# Patient Record
Sex: Female | Born: 1985 | Race: Black or African American | Hispanic: No | Marital: Single | Smoking: Former smoker
Health system: Southern US, Community
[De-identification: ages and names within clinical notes are randomized; demographics above are authoritative.]

## PROBLEM LIST (undated history)

## (undated) ENCOUNTER — Inpatient Hospital Stay (HOSPITAL_COMMUNITY): Payer: Medicaid Other

## (undated) ENCOUNTER — Ambulatory Visit: Discharge status: Absent without leave | Payer: Medicaid Other

## (undated) ENCOUNTER — Ambulatory Visit: Source: Home / Self Care

## (undated) DIAGNOSIS — IMO0002 Reserved for concepts with insufficient information to code with codable children: Secondary | ICD-10-CM

## (undated) DIAGNOSIS — F319 Bipolar disorder, unspecified: Secondary | ICD-10-CM

## (undated) DIAGNOSIS — Z8742 Personal history of other diseases of the female genital tract: Secondary | ICD-10-CM

## (undated) DIAGNOSIS — F410 Panic disorder [episodic paroxysmal anxiety] without agoraphobia: Secondary | ICD-10-CM

## (undated) DIAGNOSIS — E669 Obesity, unspecified: Secondary | ICD-10-CM

## (undated) DIAGNOSIS — R87619 Unspecified abnormal cytological findings in specimens from cervix uteri: Secondary | ICD-10-CM

## (undated) DIAGNOSIS — F419 Anxiety disorder, unspecified: Secondary | ICD-10-CM

## (undated) DIAGNOSIS — A749 Chlamydial infection, unspecified: Secondary | ICD-10-CM

## (undated) DIAGNOSIS — J189 Pneumonia, unspecified organism: Secondary | ICD-10-CM

## (undated) DIAGNOSIS — D219 Benign neoplasm of connective and other soft tissue, unspecified: Secondary | ICD-10-CM

## (undated) DIAGNOSIS — F4310 Post-traumatic stress disorder, unspecified: Secondary | ICD-10-CM

## (undated) DIAGNOSIS — A549 Gonococcal infection, unspecified: Secondary | ICD-10-CM

## (undated) DIAGNOSIS — J40 Bronchitis, not specified as acute or chronic: Secondary | ICD-10-CM

## (undated) DIAGNOSIS — U071 COVID-19: Secondary | ICD-10-CM

## (undated) DIAGNOSIS — K219 Gastro-esophageal reflux disease without esophagitis: Secondary | ICD-10-CM

## (undated) DIAGNOSIS — Z973 Presence of spectacles and contact lenses: Secondary | ICD-10-CM

## (undated) DIAGNOSIS — F32A Depression, unspecified: Secondary | ICD-10-CM

## (undated) DIAGNOSIS — R7303 Prediabetes: Secondary | ICD-10-CM

## (undated) DIAGNOSIS — G4730 Sleep apnea, unspecified: Secondary | ICD-10-CM

## (undated) DIAGNOSIS — I10 Essential (primary) hypertension: Secondary | ICD-10-CM

## (undated) DIAGNOSIS — D649 Anemia, unspecified: Secondary | ICD-10-CM

## (undated) DIAGNOSIS — N390 Urinary tract infection, site not specified: Secondary | ICD-10-CM

## (undated) DIAGNOSIS — N920 Excessive and frequent menstruation with regular cycle: Secondary | ICD-10-CM

## (undated) DIAGNOSIS — A599 Trichomoniasis, unspecified: Secondary | ICD-10-CM

## (undated) DIAGNOSIS — O139 Gestational [pregnancy-induced] hypertension without significant proteinuria, unspecified trimester: Secondary | ICD-10-CM

## (undated) DIAGNOSIS — F329 Major depressive disorder, single episode, unspecified: Secondary | ICD-10-CM

## (undated) HISTORY — PX: TUBAL LIGATION: SHX77

## (undated) HISTORY — PX: WISDOM TOOTH EXTRACTION: SHX21

## (undated) HISTORY — PX: ABDOMINAL SURGERY: SHX537

## (undated) HISTORY — PX: HERNIA REPAIR: SHX51

## (undated) HISTORY — PX: FOOT SURGERY: SHX648

---
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
## 2003-03-15 ENCOUNTER — Encounter: Payer: Self-pay | Admitting: Obstetrics & Gynecology

## 2003-03-15 ENCOUNTER — Ambulatory Visit (HOSPITAL_COMMUNITY): Admission: RE | Admit: 2003-03-15 | Discharge: 2003-03-15 | Payer: Self-pay | Admitting: Obstetrics & Gynecology

## 2003-05-01 ENCOUNTER — Inpatient Hospital Stay (HOSPITAL_COMMUNITY): Admission: AD | Admit: 2003-05-01 | Discharge: 2003-05-05 | Payer: Self-pay | Admitting: Obstetrics

## 2003-05-02 ENCOUNTER — Encounter (INDEPENDENT_AMBULATORY_CARE_PROVIDER_SITE_OTHER): Payer: Self-pay | Admitting: *Deleted

## 2003-10-12 ENCOUNTER — Emergency Department (HOSPITAL_COMMUNITY): Admission: EM | Admit: 2003-10-12 | Discharge: 2003-10-12 | Payer: Self-pay | Admitting: Family Medicine

## 2004-01-24 ENCOUNTER — Emergency Department (HOSPITAL_COMMUNITY): Admission: EM | Admit: 2004-01-24 | Discharge: 2004-01-24 | Payer: Self-pay | Admitting: Family Medicine

## 2004-07-27 ENCOUNTER — Emergency Department (HOSPITAL_COMMUNITY): Admission: EM | Admit: 2004-07-27 | Discharge: 2004-07-27 | Payer: Self-pay | Admitting: Family Medicine

## 2006-01-24 ENCOUNTER — Emergency Department (HOSPITAL_COMMUNITY): Admission: EM | Admit: 2006-01-24 | Discharge: 2006-01-24 | Payer: Self-pay | Admitting: Emergency Medicine

## 2006-03-08 ENCOUNTER — Emergency Department (HOSPITAL_COMMUNITY): Admission: EM | Admit: 2006-03-08 | Discharge: 2006-03-08 | Payer: Self-pay | Admitting: Emergency Medicine

## 2006-11-03 ENCOUNTER — Inpatient Hospital Stay (HOSPITAL_COMMUNITY): Admission: AD | Admit: 2006-11-03 | Discharge: 2006-11-03 | Payer: Self-pay | Admitting: Obstetrics & Gynecology

## 2006-11-09 ENCOUNTER — Inpatient Hospital Stay (HOSPITAL_COMMUNITY): Admission: AD | Admit: 2006-11-09 | Discharge: 2006-11-09 | Payer: Self-pay | Admitting: Obstetrics and Gynecology

## 2006-12-02 ENCOUNTER — Emergency Department (HOSPITAL_COMMUNITY): Admission: EM | Admit: 2006-12-02 | Discharge: 2006-12-02 | Payer: Self-pay | Admitting: Emergency Medicine

## 2007-01-06 ENCOUNTER — Ambulatory Visit (HOSPITAL_COMMUNITY): Admission: RE | Admit: 2007-01-06 | Discharge: 2007-01-06 | Payer: Self-pay | Admitting: Family Medicine

## 2007-01-28 ENCOUNTER — Ambulatory Visit (HOSPITAL_COMMUNITY): Admission: RE | Admit: 2007-01-28 | Discharge: 2007-01-28 | Payer: Self-pay | Admitting: Family Medicine

## 2007-02-10 ENCOUNTER — Ambulatory Visit (HOSPITAL_COMMUNITY): Admission: RE | Admit: 2007-02-10 | Discharge: 2007-02-10 | Payer: Self-pay | Admitting: Family Medicine

## 2007-03-10 ENCOUNTER — Ambulatory Visit (HOSPITAL_COMMUNITY): Admission: RE | Admit: 2007-03-10 | Discharge: 2007-03-10 | Payer: Self-pay | Admitting: Family Medicine

## 2007-04-07 ENCOUNTER — Ambulatory Visit (HOSPITAL_COMMUNITY): Admission: RE | Admit: 2007-04-07 | Discharge: 2007-04-07 | Payer: Self-pay | Admitting: Family Medicine

## 2007-05-03 ENCOUNTER — Ambulatory Visit (HOSPITAL_COMMUNITY): Admission: RE | Admit: 2007-05-03 | Discharge: 2007-05-03 | Payer: Self-pay | Admitting: Family Medicine

## 2007-06-16 ENCOUNTER — Ambulatory Visit (HOSPITAL_COMMUNITY): Admission: RE | Admit: 2007-06-16 | Discharge: 2007-06-16 | Payer: Self-pay | Admitting: Family Medicine

## 2007-06-22 ENCOUNTER — Ambulatory Visit (HOSPITAL_COMMUNITY): Admission: RE | Admit: 2007-06-22 | Discharge: 2007-06-22 | Payer: Self-pay | Admitting: Family Medicine

## 2007-06-30 ENCOUNTER — Ambulatory Visit (HOSPITAL_COMMUNITY): Admission: RE | Admit: 2007-06-30 | Discharge: 2007-06-30 | Payer: Self-pay | Admitting: Family Medicine

## 2007-07-05 ENCOUNTER — Inpatient Hospital Stay (HOSPITAL_COMMUNITY): Admission: RE | Admit: 2007-07-05 | Discharge: 2007-07-07 | Payer: Self-pay | Admitting: Obstetrics & Gynecology

## 2007-07-05 ENCOUNTER — Other Ambulatory Visit: Payer: Self-pay | Admitting: Obstetrics & Gynecology

## 2007-07-05 ENCOUNTER — Ambulatory Visit: Payer: Self-pay | Admitting: Obstetrics & Gynecology

## 2007-09-12 ENCOUNTER — Emergency Department (HOSPITAL_COMMUNITY): Admission: EM | Admit: 2007-09-12 | Discharge: 2007-09-12 | Payer: Self-pay | Admitting: Family Medicine

## 2008-04-04 ENCOUNTER — Emergency Department (HOSPITAL_COMMUNITY): Admission: EM | Admit: 2008-04-04 | Discharge: 2008-04-04 | Payer: Self-pay | Admitting: Family Medicine

## 2008-04-08 ENCOUNTER — Emergency Department (HOSPITAL_COMMUNITY): Admission: EM | Admit: 2008-04-08 | Discharge: 2008-04-08 | Payer: Self-pay | Admitting: Emergency Medicine

## 2008-10-02 ENCOUNTER — Emergency Department (HOSPITAL_COMMUNITY): Admission: EM | Admit: 2008-10-02 | Discharge: 2008-10-02 | Payer: Self-pay | Admitting: Family Medicine

## 2008-11-11 ENCOUNTER — Emergency Department (HOSPITAL_COMMUNITY): Admission: EM | Admit: 2008-11-11 | Discharge: 2008-11-11 | Payer: Self-pay | Admitting: Emergency Medicine

## 2009-03-01 ENCOUNTER — Emergency Department (HOSPITAL_COMMUNITY): Admission: EM | Admit: 2009-03-01 | Discharge: 2009-03-01 | Payer: Self-pay | Admitting: Family Medicine

## 2009-08-13 ENCOUNTER — Emergency Department (HOSPITAL_COMMUNITY): Admission: EM | Admit: 2009-08-13 | Discharge: 2009-08-14 | Payer: Self-pay | Admitting: Emergency Medicine

## 2009-10-14 ENCOUNTER — Emergency Department (HOSPITAL_COMMUNITY): Admission: EM | Admit: 2009-10-14 | Discharge: 2009-10-14 | Payer: Self-pay | Admitting: Emergency Medicine

## 2009-11-28 ENCOUNTER — Emergency Department (HOSPITAL_COMMUNITY): Admission: EM | Admit: 2009-11-28 | Discharge: 2009-11-29 | Payer: Self-pay | Admitting: Emergency Medicine

## 2010-01-02 ENCOUNTER — Inpatient Hospital Stay (HOSPITAL_COMMUNITY): Admission: AD | Admit: 2010-01-02 | Discharge: 2010-01-02 | Payer: Self-pay | Admitting: Obstetrics & Gynecology

## 2010-01-20 ENCOUNTER — Inpatient Hospital Stay (HOSPITAL_COMMUNITY): Admission: AD | Admit: 2010-01-20 | Discharge: 2010-01-20 | Payer: Self-pay | Admitting: Obstetrics & Gynecology

## 2010-01-20 ENCOUNTER — Ambulatory Visit: Payer: Self-pay | Admitting: Family

## 2010-02-20 ENCOUNTER — Ambulatory Visit: Payer: Self-pay | Admitting: Obstetrics and Gynecology

## 2010-02-20 ENCOUNTER — Inpatient Hospital Stay (HOSPITAL_COMMUNITY): Admission: AD | Admit: 2010-02-20 | Discharge: 2010-02-20 | Payer: Self-pay | Admitting: Obstetrics and Gynecology

## 2010-03-06 ENCOUNTER — Ambulatory Visit: Payer: Self-pay | Admitting: Obstetrics and Gynecology

## 2010-03-06 ENCOUNTER — Inpatient Hospital Stay (HOSPITAL_COMMUNITY): Admission: AD | Admit: 2010-03-06 | Discharge: 2010-03-06 | Payer: Self-pay | Admitting: Obstetrics and Gynecology

## 2010-03-06 ENCOUNTER — Encounter: Payer: Self-pay | Admitting: Family Medicine

## 2010-05-13 ENCOUNTER — Inpatient Hospital Stay (HOSPITAL_COMMUNITY)
Admission: AD | Admit: 2010-05-13 | Discharge: 2010-05-13 | Payer: Self-pay | Source: Home / Self Care | Admitting: Family Medicine

## 2010-06-09 HISTORY — PX: TUBAL LIGATION: SHX77

## 2010-07-18 ENCOUNTER — Other Ambulatory Visit: Payer: Self-pay | Admitting: Obstetrics and Gynecology

## 2010-07-18 ENCOUNTER — Ambulatory Visit (HOSPITAL_COMMUNITY): Payer: Medicaid Other | Attending: Obstetrics and Gynecology

## 2010-07-18 ENCOUNTER — Other Ambulatory Visit (HOSPITAL_COMMUNITY): Payer: Self-pay | Admitting: Obstetrics and Gynecology

## 2010-07-18 DIAGNOSIS — Z3689 Encounter for other specified antenatal screening: Secondary | ICD-10-CM

## 2010-07-30 ENCOUNTER — Ambulatory Visit (HOSPITAL_COMMUNITY)
Admission: RE | Admit: 2010-07-30 | Discharge: 2010-07-30 | Disposition: A | Discharge status: Home / Self Care | Payer: Medicaid Other | Source: Ambulatory Visit | Attending: Obstetrics and Gynecology | Admitting: Obstetrics and Gynecology

## 2010-07-30 ENCOUNTER — Encounter (HOSPITAL_COMMUNITY): Payer: Self-pay

## 2010-07-30 DIAGNOSIS — O358XX0 Maternal care for other (suspected) fetal abnormality and damage, not applicable or unspecified: Secondary | ICD-10-CM | POA: Insufficient documentation

## 2010-07-30 DIAGNOSIS — Z1389 Encounter for screening for other disorder: Secondary | ICD-10-CM | POA: Insufficient documentation

## 2010-07-30 DIAGNOSIS — Z3689 Encounter for other specified antenatal screening: Secondary | ICD-10-CM

## 2010-07-30 DIAGNOSIS — E669 Obesity, unspecified: Secondary | ICD-10-CM | POA: Insufficient documentation

## 2010-07-30 DIAGNOSIS — Z363 Encounter for antenatal screening for malformations: Secondary | ICD-10-CM | POA: Insufficient documentation

## 2010-08-01 ENCOUNTER — Other Ambulatory Visit (HOSPITAL_COMMUNITY): Payer: Self-pay | Admitting: Obstetrics and Gynecology

## 2010-08-19 LAB — URINALYSIS, ROUTINE W REFLEX MICROSCOPIC
Bilirubin Urine: NEGATIVE
Glucose, UA: NEGATIVE mg/dL
Ketones, ur: NEGATIVE mg/dL
Leukocytes, UA: NEGATIVE
Nitrite: NEGATIVE
Protein, ur: NEGATIVE mg/dL
Specific Gravity, Urine: >1.03 — ABNORMAL HIGH (ref 1.005–1.030)
Urobilinogen, UA: 2 mg/dL — ABNORMAL HIGH (ref 0.0–1.0)
pH: 6 (ref 5.0–8.0)

## 2010-08-19 LAB — WET PREP, GENITAL
Trich, Wet Prep: NONE SEEN
Yeast Wet Prep HPF POC: NONE SEEN

## 2010-08-19 LAB — URINE CULTURE
Colony Count: 75000
Culture  Setup Time: 201112060123

## 2010-08-19 LAB — URINE MICROSCOPIC-ADD ON

## 2010-08-19 LAB — GC/CHLAMYDIA PROBE AMP, GENITAL
Chlamydia, DNA Probe: NEGATIVE
GC Probe Amp, Genital: NEGATIVE

## 2010-08-22 LAB — URINALYSIS, ROUTINE W REFLEX MICROSCOPIC
Bilirubin Urine: NEGATIVE
Bilirubin Urine: NEGATIVE
Glucose, UA: NEGATIVE mg/dL
Glucose, UA: NEGATIVE mg/dL
Ketones, ur: NEGATIVE mg/dL
Ketones, ur: 15 mg/dL — AB
Leukocytes, UA: NEGATIVE
Leukocytes, UA: NEGATIVE
Nitrite: NEGATIVE
Nitrite: NEGATIVE
Protein, ur: NEGATIVE mg/dL
Protein, ur: NEGATIVE mg/dL
Specific Gravity, Urine: 1.02 (ref 1.005–1.030)
Specific Gravity, Urine: >1.03 — ABNORMAL HIGH (ref 1.005–1.030)
Urobilinogen, UA: 1 mg/dL (ref 0.0–1.0)
Urobilinogen, UA: 1 mg/dL (ref 0.0–1.0)
pH: 6 (ref 5.0–8.0)
pH: 6 (ref 5.0–8.0)

## 2010-08-22 LAB — ABO/RH: ABO/RH(D): O POS

## 2010-08-22 LAB — CBC
HCT: 35.8 % — ABNORMAL LOW (ref 36.0–46.0)
Hemoglobin: 12.2 g/dL (ref 12.0–15.0)
MCH: 29.9 pg (ref 26.0–34.0)
MCHC: 34.2 g/dL (ref 30.0–36.0)
MCV: 87.5 fL (ref 78.0–100.0)
Platelets: 290 10*3/uL (ref 150–400)
RBC: 4.09 MIL/uL (ref 3.87–5.11)
RDW: 14.3 % (ref 11.5–15.5)
WBC: 12.3 10*3/uL — ABNORMAL HIGH (ref 4.0–10.5)

## 2010-08-22 LAB — URINE MICROSCOPIC-ADD ON

## 2010-08-22 LAB — POCT PREGNANCY, URINE
Preg Test, Ur: POSITIVE
Preg Test, Ur: NEGATIVE
Preg Test, Ur: NEGATIVE

## 2010-08-22 LAB — HCG, QUANTITATIVE, PREGNANCY: hCG, Beta Chain, Quant, S: 20141 m[IU]/mL — ABNORMAL HIGH (ref <5)

## 2010-08-23 LAB — URINALYSIS, ROUTINE W REFLEX MICROSCOPIC
Glucose, UA: NEGATIVE mg/dL
Ketones, ur: 15 mg/dL — AB
Leukocytes, UA: NEGATIVE
Nitrite: NEGATIVE
Protein, ur: NEGATIVE mg/dL
Specific Gravity, Urine: 1.02 (ref 1.005–1.030)
Urobilinogen, UA: 1 mg/dL (ref 0.0–1.0)
pH: 6.5 (ref 5.0–8.0)

## 2010-08-23 LAB — POCT PREGNANCY, URINE: Preg Test, Ur: NEGATIVE

## 2010-08-23 LAB — URINE MICROSCOPIC-ADD ON

## 2010-08-24 ENCOUNTER — Inpatient Hospital Stay (HOSPITAL_COMMUNITY)
Admission: AD | Admit: 2010-08-24 | Discharge: 2010-08-24 | Disposition: A | Discharge status: Home / Self Care | Payer: Medicaid Other | Source: Ambulatory Visit | Attending: Obstetrics and Gynecology | Admitting: Obstetrics and Gynecology

## 2010-08-24 DIAGNOSIS — O4700 False labor before 37 completed weeks of gestation, unspecified trimester: Secondary | ICD-10-CM | POA: Insufficient documentation

## 2010-08-24 LAB — URINALYSIS, ROUTINE W REFLEX MICROSCOPIC
Bilirubin Urine: NEGATIVE
Glucose, UA: NEGATIVE mg/dL
Glucose, UA: NEGATIVE mg/dL
Ketones, ur: NEGATIVE mg/dL
Ketones, ur: 15 mg/dL — AB
Leukocytes, UA: NEGATIVE
Leukocytes, UA: NEGATIVE
Nitrite: NEGATIVE
Nitrite: NEGATIVE
Protein, ur: 30 mg/dL — AB
Protein, ur: NEGATIVE mg/dL
Specific Gravity, Urine: 1.02 (ref 1.005–1.030)
Specific Gravity, Urine: >1.03 — ABNORMAL HIGH (ref 1.005–1.030)
Urobilinogen, UA: 1 mg/dL (ref 0.0–1.0)
Urobilinogen, UA: 1 mg/dL (ref 0.0–1.0)
pH: 7 (ref 5.0–8.0)
pH: 6 (ref 5.0–8.0)

## 2010-08-24 LAB — POCT PREGNANCY, URINE: Preg Test, Ur: NEGATIVE

## 2010-08-24 LAB — URINE MICROSCOPIC-ADD ON

## 2010-08-24 LAB — FETAL FIBRONECTIN: Fetal Fibronectin: NEGATIVE

## 2010-08-26 LAB — URINE CULTURE
Colony Count: NO GROWTH
Culture  Setup Time: 201203180321
Culture: NO GROWTH

## 2010-08-27 ENCOUNTER — Ambulatory Visit (HOSPITAL_COMMUNITY)
Admission: RE | Admit: 2010-08-27 | Discharge: 2010-08-27 | Disposition: A | Discharge status: Home / Self Care | Payer: Medicaid Other | Source: Ambulatory Visit | Attending: Obstetrics and Gynecology | Admitting: Obstetrics and Gynecology

## 2010-08-27 ENCOUNTER — Other Ambulatory Visit (HOSPITAL_COMMUNITY): Payer: Self-pay | Admitting: Obstetrics and Gynecology

## 2010-08-27 DIAGNOSIS — Z3689 Encounter for other specified antenatal screening: Secondary | ICD-10-CM | POA: Insufficient documentation

## 2010-09-18 LAB — POCT H PYLORI SCREEN: H. PYLORI SCREEN, POC: NEGATIVE

## 2010-10-01 ENCOUNTER — Ambulatory Visit (HOSPITAL_COMMUNITY)
Admission: RE | Admit: 2010-10-01 | Discharge: 2010-10-01 | Disposition: A | Discharge status: Home / Self Care | Payer: Medicaid Other | Source: Ambulatory Visit | Attending: Obstetrics and Gynecology | Admitting: Obstetrics and Gynecology

## 2010-10-01 DIAGNOSIS — O99210 Obesity complicating pregnancy, unspecified trimester: Secondary | ICD-10-CM | POA: Insufficient documentation

## 2010-10-01 DIAGNOSIS — E669 Obesity, unspecified: Secondary | ICD-10-CM | POA: Insufficient documentation

## 2010-10-01 DIAGNOSIS — O34219 Maternal care for unspecified type scar from previous cesarean delivery: Secondary | ICD-10-CM | POA: Insufficient documentation

## 2010-10-17 ENCOUNTER — Other Ambulatory Visit: Payer: Self-pay | Admitting: Obstetrics and Gynecology

## 2010-10-17 ENCOUNTER — Encounter (HOSPITAL_COMMUNITY): Payer: Medicaid Other

## 2010-10-17 LAB — CBC
HCT: 33.5 % — ABNORMAL LOW (ref 36.0–46.0)
Hemoglobin: 10.8 g/dL — ABNORMAL LOW (ref 12.0–15.0)
MCH: 27 pg (ref 26.0–34.0)
MCHC: 32.2 g/dL (ref 30.0–36.0)
MCV: 83.8 fL (ref 78.0–100.0)
Platelets: 284 10*3/uL (ref 150–400)
RBC: 4 MIL/uL (ref 3.87–5.11)
RDW: 15.2 % (ref 11.5–15.5)
WBC: 10.9 10*3/uL — ABNORMAL HIGH (ref 4.0–10.5)

## 2010-10-17 LAB — SURGICAL PCR SCREEN
MRSA, PCR: NEGATIVE
Staphylococcus aureus: NEGATIVE

## 2010-10-18 LAB — RPR: RPR Ser Ql: NONREACTIVE

## 2010-10-22 NOTE — Op Note (Signed)
[name], [name]              ACCOUNT NO.:  [id number]   MEDICAL RECORD NO.:  [id number]          PATIENT TYPE:  INP   LOCATION:  9101                          FACILITY:  [facility]   PHYSICIAN:  Allie Bossier, MD        DATE OF BIRTH:  Feb 06, 1986   DATE OF PROCEDURE:  07/05/2007  DATE OF DISCHARGE:                               OPERATIVE REPORT   PREOPERATIVE DIAGNOSES:  1. Intrauterine pregnancy at 39 weeks three days gestation.  2. History of previous cesarean section.  3. Suspected fetal macrosomia.  4. Morbid obesity.   POSTOPERATIVE DIAGNOSES:  1. Intrauterine pregnancy at 39 weeks three days gestation.  2. History of previous cesarean section.  3. Suspected fetal macrosomia.  4. Morbid obesity.   PROCEDURE:  Repeat low transverse cesarean section.   SURGEON:  Nicholaus Bloom, M.D.   ASSISTANT:  Karlton Lemon, M.D.   ANESTHESIA:  Spinal.   FINDINGS:  1. Viable infant female weighing 8 pounds 15 ounces with Apgars 9 at one      minute and 9 at five minutes in cephalic presentation.  2. Clear amniotic fluid.  3. Normal female pelvic anatomy.   ESTIMATED BLOOD LOSS:  800 ml.   DRAINS:  Foley with clear yellow urine.   COMPLICATIONS:  None immediate.   SPECIMENS:  Placenta to labor and delivery; cord blood to the lab.   INDICATIONS FOR PROCEDURE:  [name] is a 25 year old gravida 2, para 1  0-0-1 at 85 weeks three days gestation with a history of previous c-  section and suspected fetal macrosomia, who presents with desire for  repeat cesarean section.   DESCRIPTION OF PROCEDURE:  The patient was taken to the operating room  and after obtaining adequate spinal anesthesia was prepped and draped in  the usual sterile manner in the supine position with left lateral  uterine displacement.  The abdomen was taped up prior to prepping to  provide adequate visualization of the operative site. After insuring  adequate anesthesia, a Pfannenstiel skin incision was made using the  scalpel. This incision was carried down through the subcutaneous tissues  using the scalpel. The rectus fascia was nicked in the midline and the  incision was extended laterally using the Mayo scissors. The rectus  muscle was dissected free of the fascia using both sharp and blunt  dissection.  The rectus muscles were separated with Mayo scissors in the  midline. The parietal peritoneum was identified and entered under direct  visualization bluntly.  The bladder blade was then placed. A transverse  uterine incision was made using the scalpel and the incision was  extended laterally and superiorly using bandage scissors and blunt  dissection. A hand was placed in the uterine cavity and used to elevate  and flex the head of the infant, but the infant's head was difficult to  deliver by this manner. The Kiwi vacuum was then applied to the infant's  head being careful not to place it over the fontanel and in one lone  steady pull, the infant's head was delivered with the vacuum traction.  The infant's head was bulb suctioned after delivery and then the  infant's body was delivered atraumatically. The cord was doubly clamped  and cut and the infant handed to the nursery team in attendance. The  specimen was collected for cord blood. The placenta was delivered with  uterine massage and traction and appeared intact. The endometrial cavity  was wiped free and any traces of membrane using wet laparotomy sponges.  The uterine incision was then closed in one layer using two stitches of  0 chromic, the first beginning on the left lateral edge to the midline  followed by a second stitch in the right lateral edge and meeting in the  midline. The uterine incision was inspected and found to be hemostatic.  The fascia was then closed with two stitches of 0 PDS beginning  laterally and meeting in the midline. Small areas of bleeding of the  subcutaneous tissue were controlled using the Bovie cautery.  The  skin  was reapproximated with stainless steel skin staples. Sponge, needle and  instrument counts were correct x 2. The patient tolerated the procedure  well and went to the postanesthesia care unit in stable condition. Her  Foley catheter drained clear urine throughout.      Karlton Lemon, MD  Electronically Signed     ______________________________  Allie Bossier, MD    NS/MEDQ  D:  07/05/2007  T:  07/06/2007  Job:  [id number]

## 2010-10-22 NOTE — Discharge Summary (Signed)
[name], [name]              ACCOUNT NO.:  [id number]   MEDICAL RECORD NO.:  [id number]          PATIENT TYPE:  INP   LOCATION:  9101                          [facility]ACILITY[facility]  [facility]   PHYSICIAN:  Allie Bossier, MD        DATE OF BIRTH:  1985-11-21   DATE OF ADMISSION:  07/05/2007  DATE OF DISCHARGE:                               DISCHARGE SUMMARY   REASON FOR ADMISSION:  Repeat low transverse cesarean section.   DISCHARGE DIAGNOSIS:  Repeat term pregnancy delivered.   PROCEDURES:  Repeat low transverse cesarean section without  complications.  Please see operative note for full details.   LABORATORY DATA:  Postop hemoglobin was 9.7, and hematocrit was 28.3.  This was drawn on 07/07/2007.   HOSPITAL COURSE:  [name] was admitted as a 25 year old, G2, P2-0-0-2.  Was admitted for repeat low transverse cesarean section at term.  Her  pregnancy was uncomplicated.  She chose to go with repeat cesarean  section as opposed to VBAC.  She delivered a healthy female via cesarean  section on 07/05/2007.  Her pain was well controlled with oral  medications.  Lochia was decreasing at the time of discharge.  Her  incision was clean, dry, and intact without any oozing.  Baby was breast  feeding well, and she was supplementing some with bottle until her milk  came in.  She plans to do the Mirena IUD for contraception.  She plans  to do a circumcision at a later date.   INSTRUCTIONS:  She is not to do any heavy lifting for six weeks and  nothing per vagina for six weeks.  Her diet is routine.   MEDICATIONS:  Include:  1. Percocet 5/325 one to two tablets p.o. q. 4 to 6 hours p.r.n. pain.  2. Motrin 600 mg 1 tab p.o. q. 6 hours p.r.n. pain.  3. Colace 100 mg 1 tab p.o. b.i.d. p.r.n. constipation.  4. Prenatal vitamins  1 tab p.o. daily.  5. Iron sulfate 325 mg 1 tab p.o. b.i.d.   STATUS:  Well.   FOLLOW UP:  She is to follow up in six weeks at the [facility] Department.      Ancil Boozer, MD      Allie Bossier, MD  Electronically Signed    SA/MEDQ  D:  07/07/2007  T:  07/07/2007  Job:  [id number]

## 2010-10-23 ENCOUNTER — Inpatient Hospital Stay (HOSPITAL_COMMUNITY)
Admission: RE | Admit: 2010-10-23 | Discharge: 2010-10-26 | DRG: 766 | Disposition: A | Discharge status: Home / Self Care | Payer: Medicaid Other | Source: Ambulatory Visit | Attending: Obstetrics and Gynecology | Admitting: Obstetrics and Gynecology

## 2010-10-23 ENCOUNTER — Other Ambulatory Visit: Payer: Self-pay | Admitting: Obstetrics and Gynecology

## 2010-10-23 DIAGNOSIS — O34219 Maternal care for unspecified type scar from previous cesarean delivery: Principal | ICD-10-CM | POA: Diagnosis present

## 2010-10-23 DIAGNOSIS — Z302 Encounter for sterilization: Secondary | ICD-10-CM

## 2010-10-23 DIAGNOSIS — E669 Obesity, unspecified: Secondary | ICD-10-CM | POA: Diagnosis present

## 2010-10-23 DIAGNOSIS — Z01818 Encounter for other preprocedural examination: Secondary | ICD-10-CM

## 2010-10-23 DIAGNOSIS — O99214 Obesity complicating childbirth: Secondary | ICD-10-CM | POA: Diagnosis present

## 2010-10-23 DIAGNOSIS — Z01812 Encounter for preprocedural laboratory examination: Secondary | ICD-10-CM

## 2010-10-24 LAB — CBC
HCT: 30 % — ABNORMAL LOW (ref 36.0–46.0)
Hemoglobin: 9.5 g/dL — ABNORMAL LOW (ref 12.0–15.0)
MCH: 26.8 pg (ref 26.0–34.0)
MCHC: 31.7 g/dL (ref 30.0–36.0)
MCV: 84.5 fL (ref 78.0–100.0)
Platelets: 241 10*3/uL (ref 150–400)
RBC: 3.55 MIL/uL — ABNORMAL LOW (ref 3.87–5.11)
RDW: 15.6 % — ABNORMAL HIGH (ref 11.5–15.5)
WBC: 11.8 10*3/uL — ABNORMAL HIGH (ref 4.0–10.5)

## 2010-10-24 NOTE — H&P (Signed)
[name], [name]              ACCOUNT NO.:  [id number]  MEDICAL RECORD NO.:  [id number]           PATIENT TYPE:  O  LOCATION:  SDC                           [facility]ACILITY:  [facility]  PHYSICIAN:  Jenny Monday, MD        DATE OF BIRTH:  04-12-1986  DATE OF ADMISSION:  10/17/2010 DATE OF DISCHARGE:  10/17/2010                             HISTORY & PHYSICAL   ADMISSION DIAGNOSIS:  Intrauterine pregnancy at 39 weeks for repeat low transverse cesarean section through vertical incision as well as bilateral tubal ligation.  She has a history of low transverse cesarean section x2 as well as undesired fertility.  HISTORY OF PRESENT ILLNESS:  A 25 year old G3, P 2-0-0-2 at 39 plus weeks for repeat low transverse cesarean section and bilateral tubal ligation through a midline vertical incision.  She states she has had good fetal movement.  No loss of fluid, no vaginal bleeding and occasional contractions.  She voices understanding of the risks, benefits and alternatives to surgery as well as with her body type that it is a safer option to proceed through a supraumbilical midline vertical incision given her large pannus as well as given her body type it is somewhat safer to proceed with tubal ligation at the time of her cesarean section even though her tubal papers have not been signed secondary to the risks of surgery.  She voices understanding of the risks, benefits and alternatives of surgery including bleeding, infection, damage to surrounding organs, trouble healing scar tissue and complications.  She also voices understanding to an increased risk of ectopic pregnancy and approximately wanted to have a risk of pregnancy after a tubal ligation.  She will proceed on Oct 23, 2010, with this surgery.  PAST MEDICAL HISTORY:  Significant for: 1. Anxiety disorder. 2. Depression. 3. GERD. 4. Hypertension. 5. Pneumonia.  PAST SURGICAL HISTORY:  Significant for cesarean section x2, foot surgery.  wisdom tooth extraction.  PAST OB/GYN HISTORY:  She is a G3, P2-0-0-2 with two term low transverse cesarean sections the first of which were failure to progress for a 9 pound female infant and a second as a repeat for a 9 pound female infant. She also has a history of abnormal Pap smear with mild dysplasia two colposcopies dated normal since this time.  She has a history of gonorrhea, Chlamydia and pelvic inflammatory disease as well as Trichomonas.  MEDICATIONS:  Prenatal vitamins.  ALLERGIES:  No known drug allergies.  No latex allergy.  SOCIAL HISTORY:  The patient denies alcohol and drug use.  She admits to having a history of smoking.  She is single, has a history of herpes but is currently safe in her relationship.  FAMILY MEDICAL HISTORY:  Significant for bipolar, cervical cancer, diabetes, hypertension, schizophrenia and thyroid dysfunction.  LABORATORY DATA:  She is O+, antibody screen negative, hemoglobin 11.1. Pap smear within normal limits.  Rubella immune, RPR nonreactive.  Urine culture negative.  She has B surface antigen negative, HIV negative, platelets 339,000.  Varicella IgG is positive.  Hemoglobin electrophoresis within normal limits.  Gonorrhea negative, Chlamydia negative.  AFP within normal limits.  She is dated by a 6-week 1-day scan with an [facility] of Oct 29, 2010.  Anatomy ultrasound secondary to her presenting somewhat later to care.  Limited heart, kidneys, and spine followup was performed revealing normal anatomy.  Glucola 106.  She was followed with growth ultrasounds and MFM.  Group B strep was negative. Prenatal care aside from a late entry to care was complicated by her morbid obesity.  PHYSICAL EXAMINATION:  VITAL SIGNS:  Stable.  Afebrile. GENERAL:  No apparent distress. CARDIOVASCULAR:  Regular rate and normal. LUNGS:  Clear to auscultation bilaterally. ABDOMEN:  Soft, obese.  Fundus is nontender. EXTREMITIES:  Symmetric and  nontender.  ASSESSMENT/PLAN:  A 25 year old G3, P 2-0-0-2 at 39 plus weeks for repeat low transverse cesarean section through midline vertical incision as well as bilateral tubal ligation.  Discussed with the patient's previously risks, benefits and alternatives including but not limited to bleeding, infection, damage to surrounding organs as well as trouble healing.  She voices understanding to all this and also her being Jehovah's Witness, I understand that her plan is to decline a blood transfusion.  This was discussed with the patient and will be revisited in case of extreme blood loss.  Discussed with the patient increased risk of ectopic pregnancy.  Discussed with the patient need for midline vertical given her extreme morbid obesity.  She voices understanding to this.  We will proceed on Oct 23, 2010.     Jenny Monday, MD     JB/MEDQ  D:  10/22/2010  T:  10/22/2010  Job:  [id number]  Electronically Signed by Jenny Monday MD on 10/24/2010 08:41:23 AM

## 2010-10-24 NOTE — Op Note (Signed)
[name], [name]              ACCOUNT NO.:  [id number]  MEDICAL RECORD NO.:  [id number]           PATIENT TYPE:  LOCATION:                                 FACILITY:  PHYSICIAN:  Sherron Monday, MD        DATE OF BIRTH:  1986/03/23  DATE OF PROCEDURE:  10/23/2010 DATE OF DISCHARGE:                              OPERATIVE REPORT   PREOPERATIVE DIAGNOSES:  Intrauterine pregnancy at 39 plus weeks, history of low-transverse cesarean section x2, undesired fertility, morbid obesity.  POSTOPERATIVE DIAGNOSES:  Intrauterine pregnancy at 39 plus weeks, history of low-transverse cesarean section x2, undesired fertility, morbid obesity, delivered.  PROCEDURE:  Repeat low-transverse cesarean section through vertical incision, bilateral tubal ligation by [place]land method.  SURGEON:  Sherron Monday, MD  ASSISTANT:  Malachi Pro. Ambrose Mantle, MD  ANESTHESIA:  CSE.  FINDINGS:  Viable female infant at 9:06 a.m. with Apgar's of 9 at 1 minute, 9 at 5 minutes, and a weight of 8 pounds 6 ounces.  Normal uterus, tubes, and ovaries were noted.  SPECIMENS:  Placenta to L&D, bilateral tubal segments to Pathology.  ESTIMATED BLOOD LOSS:  600 mL.  IV FLUIDS:  2400 mL urine output, 100 mL clear urine at the end of the procedure.  COMPLICATIONS:  None.  DISPOSITION:  Stable to PACU.  PROCEDURE:  After informed consent was reviewed with the patient including risks, benefits and alternatives including but not limited to bleeding, infection, damage to surrounding organs, trouble healing as well as increased risk of ectopic pregnancy and need for vertical skin incision secondary to her habitus, she was transported to the OR, placed on the table after spinal anesthesia had been placed.  She was placed on the table in supine position with a leftward tilt.  A midline vertical incision was made supraumbilically after the pannus had been taped down. The Foley had also previously been sterilely placed and she was  prepped and draped in a sterile fashion.  A midline vertical incision was made, carried through to the underlying layer of fascia sharply.  Fascia was incised in the midline.  The incision was extended superiorly and inferiorly with good visualization.  The peritoneum was entered bluntly. The Alexis retractor was placed carefully making sure no bowel was entrapped.  The uterus was explored.  A low transverse incision was made.  It was felt to be slightly higher than low-transverse.  Incision was made.  The infant was delivered from the vertex presentation.  Nose and mouth were suctioned.  Cord was clamped and cut.  Infant was handed off to the awaiting pediatric staff.  The placenta was then expressed from the uterus.  Uterus was cleared of all clot and debris.  The uterine incision was outlined with ring clamps.  Uterine incision was closed with 0 Monocryl in 2 layers, first of which is running locked and second as an imbricating is noted to be hemostatic.  The uterus was exteriorized, the tubes were explored, followed out to the fimbriated end.  The mesosalpinx area of the tube was elevated with Babcock.  A window was created in the mesosalpinx using Bovie cautery.  Tube was ligated in a Parkland fashion.  Intervening section was sent to Pathology.  Tube was ligated using plain gut.  It was noted to be hemostatic.  A similar procedure was carried on the right side.  The tube was identified, followed out to the fimbriated end, elevated with a Babcock suture.  Window was created in the mesosalpinx using the Bovie cautery in a Parkland fashion.  The tube was ligated using plain gut suture.  The intervening portion was sent off to Pathology.  The uterus was replaced in the abdomen.  Copious irrigation was performed.  The tubal sites were again explored and found to be hemostatic.  The uterine incision was also hemostatic.  The Alexis retractor was removed.  The peritoneum was closed using  2-0 Vicryl.  The fascia was closed using a looped PDS.  Subcuticular adipose layer was irrigated and made hemostatic with Bovie cautery, reapproximated using plain gut.  Skin was closed with staples.  The patient tolerated the procedure well.  At the end of the procedure, sponge, lap, and needle counts were correct x2 per the operating room staff.     Sherron Monday, MD     JB/MEDQ  D:  10/23/2010  T:  10/23/2010  Job:  [id number]  Electronically Signed by Sherron Monday MD on 10/24/2010 08:42:31 AM

## 2010-10-25 NOTE — Discharge Summary (Signed)
NAME:  [name], [name]                        ACCOUNT NO.:  [id number]   MEDICAL RECORD NO.:  [id number]                   PATIENT TYPE:  INP   LOCATION:  9131                                 FACILITY:  [facility]   PHYSICIAN:  Roseanna Rainbow, M.D.         DATE OF BIRTH:  15-Jan-1986   DATE OF ADMISSION:  05/01/2003  DATE OF DISCHARGE:  05/05/2003                                 DISCHARGE SUMMARY   CHIEF COMPLAINT:  The patient is a 25 year old, gravida 1, African American  female with an estimated date of confinement of November 19th who presented  for induction of labor for post dates.   HISTORY OF PRESENT ILLNESS:  As above.  Recent ultrasound was consistent  with a possible large for gestational age fetus. GBS was negative.   PAST SURGICAL HISTORY:  Foot surgery.   PAST MEDICAL HISTORY:  She denies.   MEDICATIONS:  Prenatal vitamins.   ALLERGIES:  No known drug allergies.   SOCIAL HISTORY:  Single. She denies any tobacco, ethanol, or substance  abuse.   PHYSICAL EXAMINATION:  VITAL SIGNS: Stable, afebrile.  ABDOMEN: Gravid, nontender.  PELVIC EXAM: Sterile vaginal exam deferred.   ASSESSMENT/PLAN:  Intrauterine pregnancy at 40+ weeks.  Rule out large for  gestational age fetus.   PLAN:  Admission; induction of labor.   HOSPITAL COURSE:  The patient was admitted and underwent induction of labor.  She progressed to 7-8 cm of dilatation and at this point was felt to have  chorioamnionitis and fetal tachycardia, mild, secondary to maternal fever.  At this point she was brought for Cesarean delivery. Please see the dictated  operative summary for further details. Her postoperative course was  remarkable for some labile blood pressures noted on postoperative day #3,  laboratory work was normal, and she was asymptomatic. She was then  discharged to home.   DISCHARGE DIAGNOSES:  1. Intrauterine pregnancy at term.  2. Large for gestational age infant.  3. Arrest of  dilatation, active phase.  4. Chorioamnionitis.   PROCEDURE:  Primary Cesarean delivery.   CONDITION:  Good.   DIET:  Regular.   ACTIVITY:  No strenuous activity, pelvic rest.   MEDICATIONS:  Percocet one to two tabs q.4-6h. as needed.   DISPOSITION:  The patient is to follow up in the office in one week.                                               Roseanna Rainbow, M.D.    Judee Clara  D:  05/29/2003  T:  05/29/2003  Job:  [id number]

## 2010-10-25 NOTE — Op Note (Signed)
NAME:  [name], [name]                        ACCOUNT NO.:  [id number]   MEDICAL RECORD NO.:  [id number]                   PATIENT TYPE:  INP   LOCATION:  9142                                 [facility]ILITY[facility]   PHYSICIAN:  Roseanna Rainbow, M.D.         DATE OF BIRTH:  11/28/1985   DATE OF PROCEDURE:  05/02/2003  DATE OF DISCHARGE:                                 OPERATIVE REPORT   PREOPERATIVE DIAGNOSES:  1. Intrauterine pregnancy at term.  2. Rule out large for gestational age fetus.  3. Arrest of dilatation, active phase.  4. Chorioamnionitis.   POSTOPERATIVE DIAGNOSES:  1. Intrauterine pregnancy at term.  2. Rule out large for gestational age fetus.  3. Arrest of dilatation, active phase.  4. Chorioamnionitis.  5. Occiput posterior presentation.   PROCEDURE:  Primary low uterine flap elliptical cesarean delivery via  Pfannenstiel.   SURGEONS:  Roseanna Rainbow, M.D., Bing Neighbors. Clearance Coots, M.D.   ANESTHESIA:  Epidural.   ESTIMATED BLOOD LOSS:  800 mL.   URINE OUTPUT AND IV FLUIDS:  As per anesthesiology.   COMPLICATIONS:  None.   DESCRIPTION OF PROCEDURE:  The patient was taken to the operating room.  The  epidural anesthetic was found to be adequate.  She was placed in the dorsal  supine position with a leftward tilt and prepped and draped in the usual  sterile fashion.  A Pfannenstiel skin incision was then made with a scalpel  and carried down to the fascia.  The fascia was nicked in the midline and  the fascial incision was then extended bilaterally with curved Mayo  scissors.  The superior aspect of the fascial incision was then tented up  with Kocher clamps and the underlying rectus muscles dissected off.  The  inferior aspect of the fascial incision was manipulated in a similar  fashion.  The rectus muscles were separated in the midline.  The parietal  peritoneum was entered bluntly.  The peritoneal incision was then extended  superiorly and inferiorly  with good visualization of the bladder.  The  bladder blade was then placed.  The vesicouterine peritoneum was tented up  and entered sharply with the Metzenbaum scissors.  This incision was then  extended bilaterally and the bladder flap created digitally.  The bladder  blade was then replaced.  The lower uterine segment was incised in a  transverse fashion.  This incision was then extended bilaterally with  bandage scissors.  The infant's head was then delivered atraumatically and  suctioned with bulb suction.  The cord was clamped and cut and the infant  was handed off to the awaiting neonatologist.  The weight was 9 pounds and  the Apgars were 9 and 9 at one and five minutes, respectively.  The placenta  was then removed.  The uterus was evacuated of amniotic fluid, clots, and  debris with a moistened laparotomy sponge.  The uterine incision was then  reapproximated in  a running interlocking fashion with 0 Monocryl.  A second  layer of the same suture was used to imbricate the suture line, and  excellent hemostasis was noted.  The paracolic gutters were then copiously  irrigated.  The parietal peritoneum was reapproximated in a running fashion  with 0 Vicryl.  The fascia was reapproximated in a running fashion with 0  Vicryl.  The skin was reapproximated with staples.  At the close of the  procedure the instrument and pad counts were said to be correct x2.  A gram  of Cefoxitin was given at cord clamp.  The patient was taken to the PACU  awake and in stable condition.                                               Roseanna Rainbow, M.D.    Judee Clara  D:  05/02/2003  T:  05/03/2003  Job:  [id number]

## 2010-10-29 ENCOUNTER — Emergency Department (HOSPITAL_COMMUNITY): Payer: Medicaid Other

## 2010-10-29 ENCOUNTER — Encounter (HOSPITAL_COMMUNITY): Payer: Self-pay | Admitting: Radiology

## 2010-10-29 ENCOUNTER — Emergency Department (HOSPITAL_COMMUNITY)
Admission: EM | Admit: 2010-10-29 | Discharge: 2010-10-29 | Disposition: A | Discharge status: Home / Self Care | Payer: Medicaid Other | Attending: Emergency Medicine | Admitting: Emergency Medicine

## 2010-10-29 DIAGNOSIS — R12 Heartburn: Secondary | ICD-10-CM | POA: Insufficient documentation

## 2010-10-29 DIAGNOSIS — J189 Pneumonia, unspecified organism: Secondary | ICD-10-CM | POA: Insufficient documentation

## 2010-10-29 DIAGNOSIS — R05 Cough: Secondary | ICD-10-CM | POA: Insufficient documentation

## 2010-10-29 DIAGNOSIS — R0602 Shortness of breath: Secondary | ICD-10-CM | POA: Insufficient documentation

## 2010-10-29 DIAGNOSIS — R42 Dizziness and giddiness: Secondary | ICD-10-CM | POA: Insufficient documentation

## 2010-10-29 DIAGNOSIS — E669 Obesity, unspecified: Secondary | ICD-10-CM | POA: Insufficient documentation

## 2010-10-29 DIAGNOSIS — M7989 Other specified soft tissue disorders: Secondary | ICD-10-CM | POA: Insufficient documentation

## 2010-10-29 DIAGNOSIS — R059 Cough, unspecified: Secondary | ICD-10-CM | POA: Insufficient documentation

## 2010-10-29 DIAGNOSIS — R0789 Other chest pain: Secondary | ICD-10-CM | POA: Insufficient documentation

## 2010-10-29 DIAGNOSIS — I10 Essential (primary) hypertension: Secondary | ICD-10-CM | POA: Insufficient documentation

## 2010-10-29 DIAGNOSIS — M549 Dorsalgia, unspecified: Secondary | ICD-10-CM | POA: Insufficient documentation

## 2010-10-29 LAB — DIFFERENTIAL
Basophils Absolute: 0 10*3/uL (ref 0.0–0.1)
Basophils Relative: 0 % (ref 0–1)
Eosinophils Absolute: 0.2 10*3/uL (ref 0.0–0.7)
Eosinophils Relative: 2 % (ref 0–5)
Lymphocytes Relative: 18 % (ref 12–46)
Lymphs Abs: 2.1 10*3/uL (ref 0.7–4.0)
Monocytes Absolute: 0.7 10*3/uL (ref 0.1–1.0)
Monocytes Relative: 6 % (ref 3–12)
Neutro Abs: 8.7 10*3/uL — ABNORMAL HIGH (ref 1.7–7.7)
Neutrophils Relative %: 75 % (ref 43–77)

## 2010-10-29 LAB — POCT CARDIAC MARKERS
CKMB, poc: <1 ng/mL — ABNORMAL LOW (ref 1.0–8.0)
Myoglobin, poc: 39 ng/mL (ref 12–200)
Troponin i, poc: <0.05 ng/mL (ref 0.00–0.09)

## 2010-10-29 LAB — POCT I-STAT, CHEM 8
BUN: 10 mg/dL (ref 6–23)
Calcium, Ion: 1.13 mmol/L (ref 1.12–1.32)
Chloride: 108 mEq/L (ref 96–112)
Creatinine, Ser: 0.7 mg/dL (ref 0.4–1.2)
Glucose, Bld: 85 mg/dL (ref 70–99)
HCT: 32 % — ABNORMAL LOW (ref 36.0–46.0)
Hemoglobin: 10.9 g/dL — ABNORMAL LOW (ref 12.0–15.0)
Potassium: 3.7 mEq/L (ref 3.5–5.1)
Sodium: 140 mEq/L (ref 135–145)
TCO2: 23 mmol/L (ref 0–100)

## 2010-10-29 LAB — PROTIME-INR
INR: 1.01 (ref 0.00–1.49)
Prothrombin Time: 13.5 seconds (ref 11.6–15.2)

## 2010-10-29 LAB — CBC
HCT: 31.3 % — ABNORMAL LOW (ref 36.0–46.0)
Hemoglobin: 10.5 g/dL — ABNORMAL LOW (ref 12.0–15.0)
MCH: 27.6 pg (ref 26.0–34.0)
MCHC: 33.5 g/dL (ref 30.0–36.0)
MCV: 82.2 fL (ref 78.0–100.0)
Platelets: 342 10*3/uL (ref 150–400)
RBC: 3.81 MIL/uL — ABNORMAL LOW (ref 3.87–5.11)
RDW: 14.7 % (ref 11.5–15.5)
WBC: 11.7 10*3/uL — ABNORMAL HIGH (ref 4.0–10.5)

## 2010-10-29 LAB — APTT: aPTT: 31 seconds (ref 24–37)

## 2010-10-29 LAB — PRO B NATRIURETIC PEPTIDE: Pro B Natriuretic peptide (BNP): 162.2 pg/mL — ABNORMAL HIGH (ref 0–125)

## 2010-10-29 MED ORDER — IOHEXOL 300 MG/ML  SOLN
80.0000 mL | Freq: Once | INTRAMUSCULAR | Status: AC | PRN
  Administered 2010-10-29: 80 mL via INTRAVENOUS

## 2010-11-03 NOTE — Discharge Summary (Signed)
  [name], [name]              ACCOUNT NO.:  [id number]  MEDICAL RECORD NO.:  [id number]           PATIENT TYPE:  I  LOCATION:  9144                          FACILITY:  [facility]  PHYSICIAN:  Malachi Pro. Ambrose Mantle, M.D. DATE OF BIRTH:  31-Oct-1985  DATE OF ADMISSION:  10/23/2010 DATE OF DISCHARGE:  10/26/2010                              DISCHARGE SUMMARY   This is a 25 year old black female para 2-0-0-2, gravida 3, admitted at 39+ weeks' gestation for cesarean section and bilateral tubal ligation. The patient's history and physical are dictated by Dr. Ellyn Hack.  She was O+ with a negative antibody.  Pap smear was normal, rubella immune, RPR nonreactive.  Urine culture negative.  Hepatitis B surface antigen negative.  HIV negative.  Varicella IgG was positive.  Hemoglobin electrophoresis was normal.  GC and Chlamydia negative.  AFP normal. Glucola was 106.  The patient's prenatal care is discussed in the history and physical.  The patient underwent a cesarean section and tubal ligation by Dr. Ellyn Hack through a midline incision above the umbilicus with delivery of a healthy 8-pound-6-ounce female infant without Apgars of 9 at 1 and 9 at 5 minutes.  Bilateral tubal segments were removed by Dr. Ellyn Hack.  Postoperatively, the patient did very well.  She did have a history of depression and anxiety.  She was seen by Social Work.  Dr. Ellyn Hack placed her on Celexa to start 10 mg a day for 1 week and then 20 mg a day.  The patient's postoperative course was uneventful.  She remained afebrile, tolerated a regular diet and on the third postop day, was ready for discharge.  Initial hemoglobin was 10.8, hematocrit 33.5, white count 10,900, platelet count 284,000.  RPR nonreactive.  Followup hemoglobin 9.5, hematocrit 30.0, white count 11,800, platelet count 241,000.  FINAL DIAGNOSES: 1. Intrauterine pregnancy at 39+ weeks. 2. Prior cesarean section, declined vaginal birth after cesarean     section. 3.  Voluntary sterilization. 4. Depression and anxiety.  OPERATION: 1. Low-transverse cervical C-section. 2. Bilateral tubal ligation.  FINAL CONDITION:  Improved.  Instructions include our regular discharge instruction booklet. Percocet 5/325 and Motrin 600 mg are given to take every 4-6 hours and every 6 hours respectively for pain.  She is also given a prescription for Celexa 10 mg to take 1 tablet a day for 7 days and then 2 tablets a day.  She is to return to the office in 5-7 days to have her staples removed and in the interim, she is to call with any problems, specifically to call with any fever above 100.4 degrees or any extremely heavy bleeding.     Malachi Pro. Ambrose Mantle, M.D.     TFH/MEDQ  D:  10/26/2010  T:  10/26/2010  Job:  [id number]  Electronically Signed by Tracey Harries M.D. on 11/03/2010 09:31:53 AM

## 2010-12-07 ENCOUNTER — Emergency Department (HOSPITAL_COMMUNITY)
Admission: EM | Admit: 2010-12-07 | Discharge: 2010-12-07 | Disposition: A | Discharge status: Home / Self Care | Payer: Medicaid Other | Attending: Emergency Medicine | Admitting: Emergency Medicine

## 2010-12-07 DIAGNOSIS — N949 Unspecified condition associated with female genital organs and menstrual cycle: Secondary | ICD-10-CM | POA: Insufficient documentation

## 2010-12-07 DIAGNOSIS — J020 Streptococcal pharyngitis: Secondary | ICD-10-CM | POA: Insufficient documentation

## 2010-12-07 DIAGNOSIS — I10 Essential (primary) hypertension: Secondary | ICD-10-CM | POA: Insufficient documentation

## 2010-12-07 DIAGNOSIS — R509 Fever, unspecified: Secondary | ICD-10-CM | POA: Insufficient documentation

## 2010-12-07 DIAGNOSIS — R109 Unspecified abdominal pain: Secondary | ICD-10-CM | POA: Insufficient documentation

## 2010-12-07 DIAGNOSIS — R42 Dizziness and giddiness: Secondary | ICD-10-CM | POA: Insufficient documentation

## 2010-12-07 DIAGNOSIS — R110 Nausea: Secondary | ICD-10-CM | POA: Insufficient documentation

## 2010-12-07 DIAGNOSIS — N938 Other specified abnormal uterine and vaginal bleeding: Secondary | ICD-10-CM | POA: Insufficient documentation

## 2010-12-07 DIAGNOSIS — F341 Dysthymic disorder: Secondary | ICD-10-CM | POA: Insufficient documentation

## 2010-12-07 LAB — URINALYSIS, ROUTINE W REFLEX MICROSCOPIC
Glucose, UA: NEGATIVE mg/dL
Leukocytes, UA: NEGATIVE
Nitrite: NEGATIVE
Protein, ur: 30 mg/dL — AB
Specific Gravity, Urine: 1.021 (ref 1.005–1.030)
Urobilinogen, UA: 1 mg/dL (ref 0.0–1.0)
pH: 6 (ref 5.0–8.0)

## 2010-12-07 LAB — URINE MICROSCOPIC-ADD ON

## 2010-12-07 LAB — RAPID STREP SCREEN (MED CTR MEBANE ONLY): Streptococcus, Group A Screen (Direct): POSITIVE — AB

## 2010-12-07 LAB — POCT PREGNANCY, URINE: Preg Test, Ur: NEGATIVE

## 2011-01-25 ENCOUNTER — Emergency Department (HOSPITAL_COMMUNITY)
Admission: EM | Admit: 2011-01-25 | Discharge: 2011-01-25 | Disposition: A | Discharge status: Home / Self Care | Payer: Medicaid Other | Attending: Emergency Medicine | Admitting: Emergency Medicine

## 2011-01-25 DIAGNOSIS — R059 Cough, unspecified: Secondary | ICD-10-CM | POA: Insufficient documentation

## 2011-01-25 DIAGNOSIS — J020 Streptococcal pharyngitis: Secondary | ICD-10-CM | POA: Insufficient documentation

## 2011-01-25 DIAGNOSIS — R000 Tachycardia, unspecified: Secondary | ICD-10-CM | POA: Insufficient documentation

## 2011-01-25 DIAGNOSIS — R05 Cough: Secondary | ICD-10-CM | POA: Insufficient documentation

## 2011-01-25 LAB — RAPID STREP SCREEN (MED CTR MEBANE ONLY): Streptococcus, Group A Screen (Direct): POSITIVE — AB

## 2011-02-27 LAB — CBC
HCT: 28.3 — ABNORMAL LOW
HCT: 32.4 — ABNORMAL LOW
Hemoglobin: 11 — ABNORMAL LOW
Hemoglobin: 9.7 — ABNORMAL LOW
MCHC: 34
MCHC: 34.4
MCV: 83.9
MCV: 84.5
Platelets: 276
Platelets: 323
RBC: 3.37 — ABNORMAL LOW
RBC: 3.83 — ABNORMAL LOW
RDW: 15.8 — ABNORMAL HIGH
RDW: 16 — ABNORMAL HIGH
WBC: 12.5 — ABNORMAL HIGH
WBC: 12.7 — ABNORMAL HIGH

## 2011-02-27 LAB — TYPE AND SCREEN
ABO/RH(D): O POS
Antibody Screen: NEGATIVE

## 2011-02-27 LAB — RPR: RPR Ser Ql: NONREACTIVE

## 2011-03-12 ENCOUNTER — Emergency Department (HOSPITAL_COMMUNITY)
Admission: EM | Admit: 2011-03-12 | Discharge: 2011-03-12 | Disposition: A | Discharge status: Home / Self Care | Payer: Medicaid Other | Attending: Emergency Medicine | Admitting: Emergency Medicine

## 2011-03-12 ENCOUNTER — Emergency Department (HOSPITAL_COMMUNITY): Payer: Medicaid Other

## 2011-03-12 DIAGNOSIS — R062 Wheezing: Secondary | ICD-10-CM | POA: Insufficient documentation

## 2011-03-12 DIAGNOSIS — R059 Cough, unspecified: Secondary | ICD-10-CM | POA: Insufficient documentation

## 2011-03-12 DIAGNOSIS — J40 Bronchitis, not specified as acute or chronic: Secondary | ICD-10-CM | POA: Insufficient documentation

## 2011-03-12 DIAGNOSIS — R05 Cough: Secondary | ICD-10-CM | POA: Insufficient documentation

## 2011-03-12 DIAGNOSIS — F17200 Nicotine dependence, unspecified, uncomplicated: Secondary | ICD-10-CM | POA: Insufficient documentation

## 2011-03-27 LAB — CBC
HCT: 37.4
Hemoglobin: 12.9
MCHC: 34.5
MCV: 85.5
Platelets: 318
RBC: 4.37
RDW: 13.4
WBC: 12.7 — ABNORMAL HIGH

## 2011-03-27 LAB — HCG, QUANTITATIVE, PREGNANCY: hCG, Beta Chain, Quant, S: 11452 — ABNORMAL HIGH

## 2011-05-28 ENCOUNTER — Emergency Department (HOSPITAL_COMMUNITY)
Admission: EM | Admit: 2011-05-28 | Discharge: 2011-05-28 | Disposition: A | Discharge status: Home / Self Care | Payer: Medicaid Other | Attending: Emergency Medicine | Admitting: Emergency Medicine

## 2011-05-28 ENCOUNTER — Encounter (HOSPITAL_COMMUNITY): Payer: Self-pay | Admitting: *Deleted

## 2011-05-28 DIAGNOSIS — J40 Bronchitis, not specified as acute or chronic: Secondary | ICD-10-CM | POA: Insufficient documentation

## 2011-05-28 DIAGNOSIS — H9209 Otalgia, unspecified ear: Secondary | ICD-10-CM | POA: Insufficient documentation

## 2011-05-28 DIAGNOSIS — J111 Influenza due to unidentified influenza virus with other respiratory manifestations: Secondary | ICD-10-CM | POA: Insufficient documentation

## 2011-05-28 HISTORY — DX: Bronchitis, not specified as acute or chronic: J40

## 2011-05-28 HISTORY — DX: Essential (primary) hypertension: I10

## 2011-05-28 HISTORY — DX: Sleep apnea, unspecified: G47.30

## 2011-05-28 MED ORDER — AZITHROMYCIN 250 MG PO TABS: 250.0000 mg | ORAL_TABLET | Freq: Every day | ORAL | Status: AC

## 2011-05-28 NOTE — ED Provider Notes (Signed)
History     CSN: [id number] Arrival date & time: 05/28/2011 12:45 PM   First MD Initiated Contact with Patient 05/28/11 1506      Chief Complaint  Patient presents with  . Cough  . Nasal Congestion  . Otalgia    (Consider location/radiation/quality/duration/timing/severity/associated sxs/prior treatment) Patient is a 25 y.o. female presenting with cough and ear pain. The history is provided by the patient.  Cough The current episode started more than 2 days ago. The problem occurs constantly. The problem has been gradually improving. The cough is productive of sputum. There has been no fever. Associated symptoms include chest pain, ear congestion, ear pain, sore throat and myalgias. Pertinent negatives include no chills, no sweats, no headaches, no shortness of breath and no eye redness. She has tried decongestants for the symptoms. The treatment provided no relief. She is a smoker.  Otalgia Associated symptoms include sore throat and cough. Pertinent negatives include no headaches, no abdominal pain, no diarrhea, no vomiting, no neck pain and no rash.   Patient symptoms started 3 days ago started out with sore throat bodyaches now coughing congestion has been trying Mucinex daily with some relief no fever no nausea vomiting or diarrhea.  Chest pain his muscle skeletal in nature associated with cough taking deep breath and certain movements.  Past Medical History  Diagnosis Date  . Sleep apnea   . Bronchitis   . Hypertension     History reviewed. No pertinent past surgical history.  No family history on file.  History  Substance Use Topics  . Smoking status: Current Everyday Smoker  . Smokeless tobacco: Not on file  . Alcohol Use: Yes     rarely    OB History    Grav Para Term Preterm Abortions TAB SAB Ect Mult Living   1               Review of Systems  Constitutional: Negative for fever and chills.  HENT: Positive for ear pain, congestion and sore throat.  Negative for neck pain.   Eyes: Negative for redness.  Respiratory: Positive for cough. Negative for shortness of breath.   Cardiovascular: Positive for chest pain.  Gastrointestinal: Negative for nausea, vomiting, abdominal pain and diarrhea.  Genitourinary: Negative for dysuria.  Musculoskeletal: Positive for myalgias.  Skin: Negative for rash.  Neurological: Negative for headaches.  Hematological: Does not bruise/bleed easily.    Allergies  Review of patient's allergies indicates no known allergies.  Home Medications   Current Outpatient Rx  Name Route Sig Dispense Refill  . ASPIRIN EFFERVESCENT 325 MG PO TBEF Oral Take 325 mg by mouth every 6 (six) hours as needed. Cold symptoms     . CITALOPRAM HYDROBROMIDE 40 MG PO TABS Oral Take 40 mg by mouth daily.      Marland Kitchen PSEUDOEPHEDRINE-GUAIFENESIN 60-600 MG PO TB12 Oral Take 1 tablet by mouth every 12 (twelve) hours.      . AZITHROMYCIN 250 MG PO TABS Oral Take 1 tablet (250 mg total) by mouth daily. Take first 2 tablets together, then 1 every day until finished. 6 tablet 0    BP 134/100  Pulse 88  Temp(Src) 97.9 F (36.6 C) (Oral)  Resp 16  SpO2 100%  LMP 05/25/2011  Physical Exam  Nursing note and vitals reviewed. Constitutional: She is oriented to person, place, and time. She appears well-developed and well-nourished. No distress.  HENT:  Head: Normocephalic and atraumatic.  Right Ear: External ear normal.  Left Ear: External ear  normal.  Mouth/Throat: Oropharynx is clear and moist.  Eyes: Conjunctivae and EOM are normal. Pupils are equal, round, and reactive to light.  Neck: Normal range of motion. Neck supple.  Cardiovascular: Normal rate, regular rhythm and normal heart sounds.   No murmur heard. Pulmonary/Chest: Effort normal and breath sounds normal. No respiratory distress. She has no wheezes. She exhibits no tenderness.  Abdominal: Soft. Bowel sounds are normal. There is no tenderness.  Musculoskeletal: Normal range  of motion.  Lymphadenopathy:    She has no cervical adenopathy.  Neurological: She is alert and oriented to person, place, and time. No cranial nerve deficit. She exhibits normal muscle tone. Coordination normal.  Skin: Skin is warm. No rash noted.    ED Course  Procedures (including critical care time)  Labs Reviewed - No data to display No results found.   1. Influenza   2. Bronchitis       MDM   The patient's symptoms consistent with influenza her flulike illness 3 day course of this illness, sore throat has improved bodyaches improve which to a soreness with cough to her chest area productive cough occasionally, oxygen saturation is normal, bilateral ear exam is normal no evidence of otitis media or externa. Suspect upper respiratory flulike illness with component of bronchitis patient is nontoxic and in no acute distress.   Will give trial of Zithromax in case of developing pneumonia no no fever patient knows most likely all viral in nature.        Shelda Jakes, MD 05/28/11 [phone]

## 2011-05-28 NOTE — ED Notes (Signed)
Pt reports cough, congestion, shob, ear pain since Sunday. Denies n/v.

## 2011-06-01 ENCOUNTER — Emergency Department (HOSPITAL_COMMUNITY)
Admission: EM | Admit: 2011-06-01 | Discharge: 2011-06-01 | Disposition: A | Discharge status: Home / Self Care | Payer: Medicaid Other | Attending: Emergency Medicine | Admitting: Emergency Medicine

## 2011-06-01 ENCOUNTER — Emergency Department (HOSPITAL_COMMUNITY): Payer: Medicaid Other

## 2011-06-01 ENCOUNTER — Encounter (HOSPITAL_COMMUNITY): Payer: Self-pay

## 2011-06-01 DIAGNOSIS — I10 Essential (primary) hypertension: Secondary | ICD-10-CM | POA: Insufficient documentation

## 2011-06-01 DIAGNOSIS — R51 Headache: Secondary | ICD-10-CM

## 2011-06-01 DIAGNOSIS — F17200 Nicotine dependence, unspecified, uncomplicated: Secondary | ICD-10-CM | POA: Insufficient documentation

## 2011-06-01 DIAGNOSIS — G4730 Sleep apnea, unspecified: Secondary | ICD-10-CM | POA: Insufficient documentation

## 2011-06-01 NOTE — ED Provider Notes (Signed)
History     CSN: [id number]  Arrival date & time 06/01/11  1418   First MD Initiated Contact with Patient 06/01/11 1712      Chief Complaint  Patient presents with  . Headache    (Consider location/radiation/quality/duration/timing/severity/associated sxs/prior treatment) HPI  Past Medical History  Diagnosis Date  . Sleep apnea   . Bronchitis   . Hypertension     History reviewed. No pertinent past surgical history.  History reviewed. No pertinent family history.  History  Substance Use Topics  . Smoking status: Current Everyday Smoker  . Smokeless tobacco: Not on file  . Alcohol Use: Yes     rarely    OB History    Grav Para Term Preterm Abortions TAB SAB Ect Mult Living   1               Review of Systems  Allergies  Review of patient's allergies indicates no known allergies.  Home Medications   Current Outpatient Rx  Name Route Sig Dispense Refill  . ASPIRIN EFFERVESCENT 325 MG PO TBEF Oral Take 325 mg by mouth every 6 (six) hours as needed. Cold symptoms     . AZITHROMYCIN 250 MG PO TABS Oral Take 1 tablet (250 mg total) by mouth daily. Take first 2 tablets together, then 1 every day until finished. 6 tablet 0  . CITALOPRAM HYDROBROMIDE 40 MG PO TABS Oral Take 40 mg by mouth daily.      Marland Kitchen PSEUDOEPHEDRINE-GUAIFENESIN 60-600 MG PO TB12 Oral Take 1 tablet by mouth every 12 (twelve) hours.        BP 154/101  Pulse 70  Temp(Src) 98.4 F (36.9 C) (Oral)  Resp 18  SpO2 99%  LMP 05/25/2011  Physical Exam  ED Course  Procedures (including critical care time)  Labs Reviewed - No data to display Ct Head Wo Contrast  06/01/2011  *RADIOLOGY REPORT*  Clinical Data:  Headache  CT HEAD WITHOUT CONTRAST  Technique:  Contiguous axial images were obtained from the base of the skull through the vertex without contrast  Comparison:  None available  Findings:  The brain has a normal appearance without evidence for hemorrhage, acute infarction, hydrocephalus, or  mass lesion.  There is no extra axial fluid collection.  Minor right ethmoid and sphenoid mucosal thickening.  Mastoids clear.  IMPRESSION: Normal CT of the head without contrast.  Original Report Authenticated By: Judie Petit. Ruel Favors, M.D.     No diagnosis found.    MDM  Ct normal.  Will discharge with recommendations for motrin, rest.        Geoffery Lyons, MD 06/01/11 1844

## 2011-06-01 NOTE — ED Notes (Signed)
Pt states that she was sitting at the computer and suddenly felt a pain in the back of her head and then she started to panic, the pain lasted a few seconds but now she states that she feels strange

## 2011-06-01 NOTE — ED Notes (Signed)
Pt in bed sleeping

## 2011-06-01 NOTE — ED Notes (Signed)
Pt given discharge instructions and stated understanding

## 2012-01-24 ENCOUNTER — Encounter (HOSPITAL_COMMUNITY): Payer: Self-pay | Admitting: *Deleted

## 2012-01-24 ENCOUNTER — Emergency Department (HOSPITAL_COMMUNITY)
Admission: EM | Admit: 2012-01-24 | Discharge: 2012-01-25 | Disposition: A | Discharge status: Home / Self Care | Payer: Medicaid Other | Attending: Emergency Medicine | Admitting: Emergency Medicine

## 2012-01-24 DIAGNOSIS — I10 Essential (primary) hypertension: Secondary | ICD-10-CM | POA: Insufficient documentation

## 2012-01-24 DIAGNOSIS — R1013 Epigastric pain: Secondary | ICD-10-CM | POA: Insufficient documentation

## 2012-01-24 DIAGNOSIS — E876 Hypokalemia: Secondary | ICD-10-CM | POA: Insufficient documentation

## 2012-01-24 DIAGNOSIS — R109 Unspecified abdominal pain: Secondary | ICD-10-CM

## 2012-01-24 DIAGNOSIS — F17200 Nicotine dependence, unspecified, uncomplicated: Secondary | ICD-10-CM | POA: Insufficient documentation

## 2012-01-24 DIAGNOSIS — G4730 Sleep apnea, unspecified: Secondary | ICD-10-CM | POA: Insufficient documentation

## 2012-01-24 HISTORY — DX: Major depressive disorder, single episode, unspecified: F32.9

## 2012-01-24 HISTORY — DX: Depression, unspecified: F32.A

## 2012-01-24 HISTORY — DX: Gastro-esophageal reflux disease without esophagitis: K21.9

## 2012-01-24 HISTORY — DX: Anxiety disorder, unspecified: F41.9

## 2012-01-24 NOTE — ED Notes (Signed)
Pt c/o epigastric pain starting this morning. Pt has hx of acid reflux and has not been taking home meds for unknown amount of time. Pt c/o nausea all day and induced vomiting earlier this morning w/ slight relief.

## 2012-01-25 LAB — BASIC METABOLIC PANEL
BUN: 8 mg/dL (ref 6–23)
CO2: 28 mEq/L (ref 19–32)
Calcium: 9.3 mg/dL (ref 8.4–10.5)
Chloride: 101 mEq/L (ref 96–112)
Creatinine, Ser: 0.57 mg/dL (ref 0.50–1.10)
GFR calc Af Amer: >90 mL/min (ref >90)
GFR calc non Af Amer: >90 mL/min (ref >90)
Glucose, Bld: 99 mg/dL (ref 70–99)
Potassium: 3 mEq/L — ABNORMAL LOW (ref 3.5–5.1)
Sodium: 136 mEq/L (ref 135–145)

## 2012-01-25 LAB — CBC WITH DIFFERENTIAL/PLATELET
Basophils Absolute: 0 10*3/uL (ref 0.0–0.1)
Basophils Relative: 0 % (ref 0–1)
Eosinophils Absolute: 0.4 10*3/uL (ref 0.0–0.7)
Eosinophils Relative: 3 % (ref 0–5)
HCT: 36.2 % (ref 36.0–46.0)
Hemoglobin: 12.2 g/dL (ref 12.0–15.0)
Lymphocytes Relative: 26 % (ref 12–46)
Lymphs Abs: 3.4 10*3/uL (ref 0.7–4.0)
MCH: 27.9 pg (ref 26.0–34.0)
MCHC: 33.7 g/dL (ref 30.0–36.0)
MCV: 82.6 fL (ref 78.0–100.0)
Monocytes Absolute: 0.6 10*3/uL (ref 0.1–1.0)
Monocytes Relative: 4 % (ref 3–12)
Neutro Abs: 9 10*3/uL — ABNORMAL HIGH (ref 1.7–7.7)
Neutrophils Relative %: 67 % (ref 43–77)
Platelets: 321 10*3/uL (ref 150–400)
RBC: 4.38 MIL/uL (ref 3.87–5.11)
RDW: 14 % (ref 11.5–15.5)
WBC: 13.3 10*3/uL — ABNORMAL HIGH (ref 4.0–10.5)

## 2012-01-25 LAB — URINALYSIS, ROUTINE W REFLEX MICROSCOPIC
Bilirubin Urine: NEGATIVE
Glucose, UA: NEGATIVE mg/dL
Hgb urine dipstick: NEGATIVE
Ketones, ur: NEGATIVE mg/dL
Leukocytes, UA: NEGATIVE
Nitrite: NEGATIVE
Protein, ur: NEGATIVE mg/dL
Specific Gravity, Urine: 1.027 (ref 1.005–1.030)
Urobilinogen, UA: 1 mg/dL (ref 0.0–1.0)
pH: 7 (ref 5.0–8.0)

## 2012-01-25 LAB — PREGNANCY, URINE: Preg Test, Ur: NEGATIVE

## 2012-01-25 MED ORDER — POTASSIUM CHLORIDE CRYS ER 20 MEQ PO TBCR
40.0000 meq | EXTENDED_RELEASE_TABLET | Freq: Once | ORAL | Status: AC
  Administered 2012-01-25: 40 meq via ORAL
  Filled 2012-01-25: qty 2

## 2012-01-25 MED ORDER — GI COCKTAIL ~~LOC~~
30.0000 mL | Freq: Once | ORAL | Status: AC
  Administered 2012-01-25: 30 mL via ORAL
  Filled 2012-01-25: qty 30

## 2012-01-25 NOTE — ED Provider Notes (Addendum)
History     CSN: [id number]  Arrival date & time 01/24/12  2117   First MD Initiated Contact with Patient 01/25/12 0125      Chief Complaint  Patient presents with  . Abdominal Pain    (Consider location/radiation/quality/duration/timing/severity/associated sxs/prior treatment) HPI Complained of epigastric pain onset 1 AM yesterday after drinking several shots of brandy. Feels much improved presently without treatment. Pain worse with lying supine improved with sitting up no vomiting no fever no other complaint last normal menstrual period this week Past Medical History  Diagnosis Date  . Sleep apnea   . Bronchitis   . Hypertension   . Acid reflux   . Anxiety   . Depression     History reviewed. No pertinent past surgical history.  History reviewed. No pertinent family history. Patient presently asymtomatic History  Substance Use Topics  . Smoking status: Current Everyday Smoker    Types: Cigarettes  . Smokeless tobacco: Not on file  . Alcohol Use: Yes     rarely   no drug use  OB History    Grav Para Term Preterm Abortions TAB SAB Ect Mult Living   1               Review of Systems  Constitutional: Negative.   HENT: Negative.   Respiratory: Negative.   Cardiovascular: Negative.   Gastrointestinal: Positive for abdominal pain.  Musculoskeletal: Negative.   Skin: Negative.   Neurological: Negative.   Hematological: Negative.   Psychiatric/Behavioral: Negative.     Allergies  Review of patient's allergies indicates no known allergies.  Home Medications   Current Outpatient Rx  Name Route Sig Dispense Refill  . ASPIRIN EFFERVESCENT 325 MG PO TBEF Oral Take 325 mg by mouth every 6 (six) hours as needed. Cold symptoms     . CITALOPRAM HYDROBROMIDE 40 MG PO TABS Oral Take 40 mg by mouth daily.      Marland Kitchen PSEUDOEPHEDRINE-GUAIFENESIN ER 60-600 MG PO TB12 Oral Take 1 tablet by mouth every 12 (twelve) hours.        BP 148/101  Pulse 75  Temp 97.9 F (36.6 C)  (Oral)  Resp 15  Ht 5\' 5"  (1.651 m)  Wt 289 lb 11.2 oz (131.407 kg)  BMI 48.21 kg/m2  SpO2 100%  LMP 01/19/2012  Physical Exam  Nursing note and vitals reviewed. Constitutional: She appears well-developed and well-nourished.  HENT:  Head: Normocephalic and atraumatic.  Eyes: Conjunctivae are normal. Pupils are equal, round, and reactive to light.  Neck: Neck supple. No tracheal deviation present. No thyromegaly present.  Cardiovascular: Normal rate and regular rhythm.   No murmur heard. Pulmonary/Chest: Effort normal and breath sounds normal.  Abdominal: Soft. Bowel sounds are normal. She exhibits no distension. There is no tenderness.       Morbidly obese  Musculoskeletal: Normal range of motion. She exhibits no edema and no tenderness.  Neurological: She is alert. Coordination normal.  Skin: Skin is warm and dry. No rash noted.  Psychiatric: She has a normal mood and affect.    ED Course  Procedures (including critical care time)   Labs Reviewed  CBC WITH DIFFERENTIAL  URINALYSIS, ROUTINE W REFLEX MICROSCOPIC  BASIC METABOLIC PANEL   No results found. Results for orders placed during the hospital encounter of 01/24/12  CBC WITH DIFFERENTIAL      Component Value Range   WBC 13.3 (*) 4.0 - 10.5 K/uL   RBC 4.38  3.87 - 5.11 MIL/uL   Hemoglobin 12.2  12.0 - 15.0 g/dL   HCT 16.1  09.6 - 04.5 %   MCV 82.6  78.0 - 100.0 fL   MCH 27.9  26.0 - 34.0 pg   MCHC 33.7  30.0 - 36.0 g/dL   RDW 40.9  81.1 - 91.4 %   Platelets 321  150 - 400 K/uL   Neutrophils Relative 67  43 - 77 %   Neutro Abs 9.0 (*) 1.7 - 7.7 K/uL   Lymphocytes Relative 26  12 - 46 %   Lymphs Abs 3.4  0.7 - 4.0 K/uL   Monocytes Relative 4  3 - 12 %   Monocytes Absolute 0.6  0.1 - 1.0 K/uL   Eosinophils Relative 3  0 - 5 %   Eosinophils Absolute 0.4  0.0 - 0.7 K/uL   Basophils Relative 0  0 - 1 %   Basophils Absolute 0.0  0.0 - 0.1 K/uL  URINALYSIS, ROUTINE W REFLEX MICROSCOPIC      Component Value Range    Color, Urine YELLOW  YELLOW   APPearance CLEAR  CLEAR   Specific Gravity, Urine 1.027  1.005 - 1.030   pH 7.0  5.0 - 8.0   Glucose, UA NEGATIVE  NEGATIVE mg/dL   Hgb urine dipstick NEGATIVE  NEGATIVE   Bilirubin Urine NEGATIVE  NEGATIVE   Ketones, ur NEGATIVE  NEGATIVE mg/dL   Protein, ur NEGATIVE  NEGATIVE mg/dL   Urobilinogen, UA 1.0  0.0 - 1.0 mg/dL   Nitrite NEGATIVE  NEGATIVE   Leukocytes, UA NEGATIVE  NEGATIVE  BASIC METABOLIC PANEL      Component Value Range   Sodium 136  135 - 145 mEq/L   Potassium 3.0 (*) 3.5 - 5.1 mEq/L   Chloride 101  96 - 112 mEq/L   CO2 28  19 - 32 mEq/L   Glucose, Bld 99  70 - 99 mg/dL   BUN 8  6 - 23 mg/dL   Creatinine, Ser 7.82  0.50 - 1.10 mg/dL   Calcium 9.3  8.4 - 95.6 mg/dL   GFR calc non Af Amer >90  >90 mL/min   GFR calc Af Amer >90  >90 mL/min  PREGNANCY, URINE      Component Value Range   Preg Test, Ur NEGATIVE  NEGATIVE   No results found.   No diagnosis found.  2:50 AM states pain is beginning to come back GI cocktail ordered. 3:25 AM states pain is improved after treatment with GI cocktail Spent 3-5 minutes counseling patient on smoking cessation MDM  Plan avoid alcohol and tobacco Pepcid AC or Zantac as directed Blood pressure recheck within 3 weeks.   diagnosis #1 abdominal pain differential diagnosis includes gastritis versus GERD #2 hypertension #3 tobacco abuse #4 hypokalemia      Doug Sou, MD 01/25/12 2130  Doug Sou, MD 01/25/12 [phone]

## 2012-03-05 ENCOUNTER — Emergency Department (HOSPITAL_COMMUNITY): Payer: Medicaid Other

## 2012-03-05 ENCOUNTER — Emergency Department (HOSPITAL_COMMUNITY)
Admission: EM | Admit: 2012-03-05 | Discharge: 2012-03-05 | Disposition: A | Discharge status: Home / Self Care | Payer: Medicaid Other | Attending: Emergency Medicine | Admitting: Emergency Medicine

## 2012-03-05 ENCOUNTER — Encounter (HOSPITAL_COMMUNITY): Payer: Self-pay | Admitting: Emergency Medicine

## 2012-03-05 DIAGNOSIS — K219 Gastro-esophageal reflux disease without esophagitis: Secondary | ICD-10-CM | POA: Insufficient documentation

## 2012-03-05 DIAGNOSIS — R071 Chest pain on breathing: Secondary | ICD-10-CM | POA: Insufficient documentation

## 2012-03-05 DIAGNOSIS — F17200 Nicotine dependence, unspecified, uncomplicated: Secondary | ICD-10-CM | POA: Insufficient documentation

## 2012-03-05 DIAGNOSIS — G4730 Sleep apnea, unspecified: Secondary | ICD-10-CM | POA: Insufficient documentation

## 2012-03-05 DIAGNOSIS — R0789 Other chest pain: Secondary | ICD-10-CM

## 2012-03-05 DIAGNOSIS — I10 Essential (primary) hypertension: Secondary | ICD-10-CM | POA: Insufficient documentation

## 2012-03-05 LAB — URINALYSIS, ROUTINE W REFLEX MICROSCOPIC
Bilirubin Urine: NEGATIVE
Glucose, UA: NEGATIVE mg/dL
Ketones, ur: NEGATIVE mg/dL
Leukocytes, UA: NEGATIVE
Nitrite: NEGATIVE
Protein, ur: NEGATIVE mg/dL
Specific Gravity, Urine: 1.016 (ref 1.005–1.030)
Urobilinogen, UA: 0.2 mg/dL (ref 0.0–1.0)
pH: 6 (ref 5.0–8.0)

## 2012-03-05 LAB — BASIC METABOLIC PANEL
BUN: 14 mg/dL (ref 6–23)
CO2: 25 mEq/L (ref 19–32)
Calcium: 9.1 mg/dL (ref 8.4–10.5)
Chloride: 104 mEq/L (ref 96–112)
Creatinine, Ser: 0.65 mg/dL (ref 0.50–1.10)
GFR calc Af Amer: >90 mL/min (ref >90)
GFR calc non Af Amer: >90 mL/min (ref >90)
Glucose, Bld: 101 mg/dL — ABNORMAL HIGH (ref 70–99)
Potassium: 3.3 mEq/L — ABNORMAL LOW (ref 3.5–5.1)
Sodium: 139 mEq/L (ref 135–145)

## 2012-03-05 LAB — CBC
HCT: 33.4 % — ABNORMAL LOW (ref 36.0–46.0)
Hemoglobin: 11 g/dL — ABNORMAL LOW (ref 12.0–15.0)
MCH: 27.2 pg (ref 26.0–34.0)
MCHC: 32.9 g/dL (ref 30.0–36.0)
MCV: 82.5 fL (ref 78.0–100.0)
Platelets: 308 10*3/uL (ref 150–400)
RBC: 4.05 MIL/uL (ref 3.87–5.11)
RDW: 14.2 % (ref 11.5–15.5)
WBC: 13.5 10*3/uL — ABNORMAL HIGH (ref 4.0–10.5)

## 2012-03-05 LAB — URINE MICROSCOPIC-ADD ON

## 2012-03-05 LAB — PREGNANCY, URINE: Preg Test, Ur: NEGATIVE

## 2012-03-05 MED ORDER — IBUPROFEN 800 MG PO TABS: 800.0000 mg | ORAL_TABLET | Freq: Three times a day (TID) | ORAL | Status: DC | PRN

## 2012-03-05 MED ORDER — KETOROLAC TROMETHAMINE 60 MG/2ML IM SOLN
60.0000 mg | Freq: Once | INTRAMUSCULAR | Status: DC
  Filled 2012-03-05: qty 2

## 2012-03-05 MED ORDER — HYDROCODONE-ACETAMINOPHEN 5-325 MG PO TABS: 1.0000 | ORAL_TABLET | Freq: Four times a day (QID) | ORAL | Status: DC | PRN

## 2012-03-05 NOTE — ED Provider Notes (Signed)
Medical screening examination/treatment/procedure(s) were performed by non-physician practitioner and as supervising physician I was immediately available for consultation/collaboration.   Celene Kras, MD 03/05/12 [phone]

## 2012-03-05 NOTE — ED Notes (Addendum)
Pt reports increasing cp more than normal over the past [place].  "It felt like something was sitting on my chest."  Pt stated she was having difficulty breathing and felt "a little clammy".  Denies any cough, fever, or chills over the past week.

## 2012-03-05 NOTE — ED Provider Notes (Signed)
History     CSN: [id number]  Arrival date & time 03/05/12  4098   First MD Initiated Contact with Patient 03/05/12 [phone]      Chief Complaint  Patient presents with  . Chest Pain    (Consider location/radiation/quality/duration/timing/severity/associated sxs/prior treatment) HPI The patient presents to the emergency department with chest pain, has been present for the past one week.  Patient, states, that the pain lasts for usually less than a minute.  Patient describes the pain as sharp in nature.  That is worse with palpation and movement. Patient denies shortness of breath, nausea, vomiting, abdominal pain, weakness, headache, visual changes, cough, fever or syncope.  Patient denies anything prior to arrival, for her discomfort.  Patient, also denies the use of oral contraceptive pills.  Past Medical History  Diagnosis Date  . Sleep apnea   . Bronchitis   . Hypertension   . Acid reflux   . Anxiety   . Depression     Past Surgical History  Procedure Date  . Cesarean section x3  . Wisdom tooth extraction   . Foot surgery     No family history on file.  History  Substance Use Topics  . Smoking status: Current Every Day Smoker    Types: Cigarettes  . Smokeless tobacco: Not on file  . Alcohol Use: Yes     rarely    OB History    Grav Para Term Preterm Abortions TAB SAB Ect Mult Living   4 3 3       3       Review of Systems All other systems negative except as documented in the HPI. All pertinent positives and negatives as reviewed in the HPI.  Allergies  Review of patient's allergies indicates no known allergies.  Home Medications  No current outpatient prescriptions on file.  BP 151/90  Pulse 84  Temp 98.1 F (36.7 C) (Oral)  Resp 22  SpO2 98%  LMP 02/08/2012  Physical Exam  Nursing note and vitals reviewed. Constitutional: She is oriented to person, place, and time. She appears well-developed and well-nourished.  HENT:  Head: Normocephalic and  atraumatic.  Mouth/Throat: Oropharynx is clear and moist.  Eyes: Pupils are equal, round, and reactive to light.  Neck: Normal range of motion. Neck supple.  Cardiovascular: Normal rate, regular rhythm and normal heart sounds.  Exam reveals no gallop and no friction rub.   No murmur heard. Pulmonary/Chest: Effort normal and breath sounds normal. No respiratory distress. She has no wheezes. She has no rales. She exhibits tenderness.  Abdominal: Soft. Bowel sounds are normal. She exhibits no distension. There is no tenderness.  Neurological: She is alert and oriented to person, place, and time.  Skin: Skin is warm and dry. No rash noted.    ED Course  Procedures (including critical care time)  Labs Reviewed  CBC - Abnormal; Notable for the following:    WBC 13.5 (*)     Hemoglobin 11.0 (*)     HCT 33.4 (*)     All other components within normal limits  BASIC METABOLIC PANEL - Abnormal; Notable for the following:    Potassium 3.3 (*)     Glucose, Bld 101 (*)     All other components within normal limits  URINALYSIS, ROUTINE W REFLEX MICROSCOPIC - Abnormal; Notable for the following:    Hgb urine dipstick TRACE (*)     All other components within normal limits  PREGNANCY, URINE  URINE MICROSCOPIC-ADD ON   Dg  Chest 2 View  03/05/2012  *RADIOLOGY REPORT*  Clinical Data: Chest pain  CHEST - 2 VIEW  Comparison: 03/12/2011  Findings: Normal heart size with clear lung fields.  No bony abnormality.  No effusion or pneumothorax.  IMPRESSION: Negative exam.  No change from prior normal chest.   Original Report Authenticated By: Elsie Stain, M.D.    Patient most likely has chest wall pain.  Patient, only risk factor for cardiac disease is smoking.  Patient is perc negative is low risk, based on Wells criteria.  Patient has atypical symptoms for any cardiac etiology.  Patient be referred back to her primary doctor and told to return here for any worsening in her condition.     MDM   Date:  03/05/2012  Rate: 83  Rhythm: normal sinus rhythm  QRS Axis: normal  Intervals: normal  ST/T Wave abnormalities: normal  Conduction Disutrbances:none  Narrative Interpretation:   Old EKG Reviewed: none available  MDM Reviewed: nursing note and vitals Interpretation: labs, ECG and x-ray             Carlyle Dolly, PA-C 03/05/12 1329

## 2012-03-05 NOTE — ED Notes (Signed)
Per EMS:  [place] bus [place] and had left sided cp with radiation down left arm. Rated 2/10  Pt has had cp for over a week.  Pt denies SOB diaphoresis.  Received 1 nitro SL and 324 asa now pain 0/10

## 2012-03-11 ENCOUNTER — Emergency Department (HOSPITAL_COMMUNITY)
Admission: EM | Admit: 2012-03-11 | Discharge: 2012-03-11 | Disposition: A | Discharge status: Home / Self Care | Payer: Medicaid Other | Attending: Emergency Medicine | Admitting: Emergency Medicine

## 2012-03-11 ENCOUNTER — Encounter (HOSPITAL_COMMUNITY): Payer: Self-pay | Admitting: *Deleted

## 2012-03-11 DIAGNOSIS — F411 Generalized anxiety disorder: Secondary | ICD-10-CM | POA: Insufficient documentation

## 2012-03-11 DIAGNOSIS — F17200 Nicotine dependence, unspecified, uncomplicated: Secondary | ICD-10-CM | POA: Insufficient documentation

## 2012-03-11 DIAGNOSIS — F419 Anxiety disorder, unspecified: Secondary | ICD-10-CM

## 2012-03-11 DIAGNOSIS — R079 Chest pain, unspecified: Secondary | ICD-10-CM | POA: Insufficient documentation

## 2012-03-11 MED ORDER — LORAZEPAM 1 MG PO TABS
1.0000 mg | ORAL_TABLET | Freq: Once | ORAL | Status: AC
  Administered 2012-03-11: 1 mg via ORAL
  Filled 2012-03-11: qty 1

## 2012-03-11 NOTE — ED Provider Notes (Signed)
Medical screening examination/treatment/procedure(s) were performed by non-physician practitioner and as supervising physician I was immediately available for consultation/collaboration.   Zillah Alexie B. Bernette Mayers, MD 03/11/12 [phone]

## 2012-03-11 NOTE — ED Provider Notes (Signed)
History     CSN: [id number]  Arrival date & time 03/11/12  0047   First MD Initiated Contact with Patient 03/11/12 0244      Chief Complaint  Patient presents with  . Anxiety    (Consider location/radiation/quality/duration/timing/severity/associated sxs/prior treatment) HPI Patient presents emergency department with an anxiety attack.  Patient, states she took tramadol for chest pain she was having.  Patient said she started feeling jittery and anxious.  The patient read tramadol can cause seizures and this made her more anxious.  Patient denies hallucinations, suicidal thoughts, shortness of breath, headache, blurred vision, or fever.  Patient, states she was recently seen for chest discomfort, which has gone on for quite a while. Past Medical History  Diagnosis Date  . Sleep apnea   . Bronchitis   . Hypertension   . Acid reflux   . Anxiety   . Depression     Past Surgical History  Procedure Date  . Cesarean section x3  . Wisdom tooth extraction   . Foot surgery     No family history on file.  History  Substance Use Topics  . Smoking status: Current Every Day Smoker    Types: Cigarettes  . Smokeless tobacco: Not on file  . Alcohol Use: Yes     rarely    OB History    Grav Para Term Preterm Abortions TAB SAB Ect Mult Living   4 3 3       3       Review of Systems All other systems negative except as documented in the HPI. All pertinent positives and negatives as reviewed in the HPI.  Allergies  Review of patient's allergies indicates no known allergies.  Home Medications   Current Outpatient Rx  Name Route Sig Dispense Refill  . HYDROCODONE-ACETAMINOPHEN 5-325 MG PO TABS Oral Take 1 tablet by mouth every 6 (six) hours as needed for pain. 10 tablet 0  . IBUPROFEN 800 MG PO TABS Oral Take 1 tablet (800 mg total) by mouth every 8 (eight) hours as needed for pain. 21 tablet 0  . TRAMADOL HCL 50 MG PO TABS Oral Take 50 mg by mouth every 6 (six) hours as  needed. Pain      BP 148/102  Pulse 84  Temp 99 F (37.2 C)  SpO2 100%  LMP 03/10/2012  Physical Exam  Nursing note and vitals reviewed. Constitutional: She is oriented to person, place, and time. She appears well-developed and well-nourished.  HENT:  Head: Normocephalic and atraumatic.  Mouth/Throat: Oropharynx is clear and moist.  Eyes: Pupils are equal, round, and reactive to light.  Cardiovascular: Normal rate, regular rhythm and normal heart sounds.  Exam reveals no gallop and no friction rub.   No murmur heard. Pulmonary/Chest: Effort normal and breath sounds normal. No respiratory distress.  Neurological: She is alert and oriented to person, place, and time.  Skin: Skin is warm and dry. No rash noted.  Psychiatric: Her mood appears anxious.    ED Course  Procedures (including critical care time)  The patient is improved at this time but states that she does still have some anxiety   MDM          Carlyle Dolly, PA-C 03/11/12 0302

## 2012-03-11 NOTE — ED Notes (Signed)
Pt states took a tramadol at 1900 and got nervous because she read it could cause seizures; presents with anxiety; shakey and jittery; states having chest pain off and on; ekg done in triage and pt states chest pain resolved after nurse talked with patient

## 2012-04-06 ENCOUNTER — Encounter (HOSPITAL_COMMUNITY): Payer: Self-pay

## 2012-04-06 ENCOUNTER — Emergency Department (HOSPITAL_COMMUNITY)
Admission: EM | Admit: 2012-04-06 | Discharge: 2012-04-06 | Disposition: A | Discharge status: Home / Self Care | Payer: Medicaid Other | Attending: Emergency Medicine | Admitting: Emergency Medicine

## 2012-04-06 DIAGNOSIS — R51 Headache: Secondary | ICD-10-CM | POA: Insufficient documentation

## 2012-04-06 DIAGNOSIS — F410 Panic disorder [episodic paroxysmal anxiety] without agoraphobia: Secondary | ICD-10-CM | POA: Insufficient documentation

## 2012-04-06 DIAGNOSIS — R110 Nausea: Secondary | ICD-10-CM | POA: Insufficient documentation

## 2012-04-06 DIAGNOSIS — G4730 Sleep apnea, unspecified: Secondary | ICD-10-CM | POA: Insufficient documentation

## 2012-04-06 DIAGNOSIS — Z8659 Personal history of other mental and behavioral disorders: Secondary | ICD-10-CM | POA: Insufficient documentation

## 2012-04-06 DIAGNOSIS — F17200 Nicotine dependence, unspecified, uncomplicated: Secondary | ICD-10-CM | POA: Insufficient documentation

## 2012-04-06 DIAGNOSIS — Z8709 Personal history of other diseases of the respiratory system: Secondary | ICD-10-CM | POA: Insufficient documentation

## 2012-04-06 DIAGNOSIS — K219 Gastro-esophageal reflux disease without esophagitis: Secondary | ICD-10-CM | POA: Insufficient documentation

## 2012-04-06 DIAGNOSIS — I10 Essential (primary) hypertension: Secondary | ICD-10-CM | POA: Insufficient documentation

## 2012-04-06 DIAGNOSIS — F411 Generalized anxiety disorder: Secondary | ICD-10-CM | POA: Insufficient documentation

## 2012-04-06 MED ORDER — METOCLOPRAMIDE HCL 5 MG/ML IJ SOLN
10.0000 mg | Freq: Once | INTRAMUSCULAR | Status: AC
  Administered 2012-04-06: 10 mg via INTRAMUSCULAR
  Filled 2012-04-06: qty 2

## 2012-04-06 MED ORDER — DIPHENHYDRAMINE HCL 25 MG PO CAPS
25.0000 mg | ORAL_CAPSULE | Freq: Once | ORAL | Status: AC
  Administered 2012-04-06: 25 mg via ORAL
  Filled 2012-04-06: qty 1

## 2012-04-06 MED ORDER — KETOROLAC TROMETHAMINE 60 MG/2ML IM SOLN
60.0000 mg | Freq: Once | INTRAMUSCULAR | Status: AC
  Administered 2012-04-06: 60 mg via INTRAMUSCULAR
  Filled 2012-04-06: qty 2

## 2012-04-06 NOTE — ED Provider Notes (Signed)
History     CSN: [id number]  Arrival date & time 04/06/12  4098   First MD Initiated Contact with Patient 04/06/12 1020      Chief Complaint  Patient presents with  . Headache    (Consider location/radiation/quality/duration/timing/severity/associated sxs/prior treatment) Patient is a 26 y.o. female presenting with headaches.  Headache  This is a new problem. The current episode started 12 to 24 hours ago. The problem occurs constantly. The pain is located in the right unilateral region. The quality of the pain is described as sharp. The pain is at a severity of 6/10. The pain does not radiate. Associated symptoms include nausea. Pertinent negatives include no fever and no vomiting.  Pt states onset of headache last night. States this morning severe, gradual in onset, states triggered a panic attack, hx of the same. Denies visual changes, vomiting, dizziness. State does have some nausea. Took ibuprofen. States panic attack resolved, headache improving. Denies injury.  Past Medical History  Diagnosis Date  . Sleep apnea   . Bronchitis   . Hypertension   . Acid reflux   . Anxiety   . Depression     Past Surgical History  Procedure Date  . Cesarean section x3  . Wisdom tooth extraction   . Foot surgery     No family history on file.  History  Substance Use Topics  . Smoking status: Current Every Day Smoker    Types: Cigarettes  . Smokeless tobacco: Not on file  . Alcohol Use: Yes     rarely    OB History    Grav Para Term Preterm Abortions TAB SAB Ect Mult Living   4 3 3       3       Review of Systems  Constitutional: Negative for fever and chills.  HENT: Negative for ear pain, congestion and neck pain.   Eyes: Negative for photophobia, pain and visual disturbance.  Respiratory: Negative.   Cardiovascular: Negative.   Gastrointestinal: Positive for nausea. Negative for vomiting.  Musculoskeletal: Negative for myalgias and arthralgias.  Skin: Negative.     Neurological: Positive for headaches. Negative for dizziness, weakness and numbness.    Allergies  Review of patient's allergies indicates no known allergies.  Home Medications   Current Outpatient Rx  Name Route Sig Dispense Refill  . IBUPROFEN 800 MG PO TABS Oral Take 800 mg by mouth every 8 (eight) hours as needed. For pain.      BP 143/71  Pulse 80  Temp 98.5 F (36.9 C) (Oral)  Resp 20  SpO2 98%  LMP 04/06/2012  Physical Exam  Nursing note and vitals reviewed. Constitutional: She is oriented to person, place, and time. She appears well-developed and well-nourished. No distress.  HENT:  Head: Normocephalic and atraumatic.  Right Ear: External ear normal.  Left Ear: External ear normal.  Mouth/Throat: Oropharynx is clear and moist.  Eyes: Conjunctivae normal are normal. Pupils are equal, round, and reactive to light.  Neck: Normal range of motion. Neck supple.  Cardiovascular: Normal rate, regular rhythm and normal heart sounds.   Pulmonary/Chest: Effort normal and breath sounds normal. No respiratory distress. She has no wheezes. She has no rales.  Lymphadenopathy:    She has no cervical adenopathy.  Neurological: She is alert and oriented to person, place, and time.       5/5 and equal upper and lower extremity strength bilaterally. Equal grip strength bilaterally. Normal finger to nose and heel to shin. No pronator drift. Patellar  reflexes 2+   Skin: Skin is warm and dry.  Psychiatric: She has a normal mood and affect. Her behavior is normal. Thought content normal.    ED Course  Procedures (including critical care time)  PT with headache, gradual onset, not concerning for intracranial bleed. No meningismus. VS normal. Panic attack resolved. HA improving. Will try medications.   Pt feeling better. Will d/c home with close outpatient follow up. Her HA resolved. She is neurovascularly intact.   1. Headache       MDM         Lottie Mussel,  PA 04/06/12 1527

## 2012-04-06 NOTE — ED Notes (Signed)
Pt c/o waken up this am with headache with stiffness, states hx of same, states on her menstrual cycle, had a panic attack on arrival of EMS, pt calm now.

## 2012-04-10 NOTE — ED Provider Notes (Signed)
Medical screening examination/treatment/procedure(s) were performed by non-physician practitioner and as supervising physician I was immediately available for consultation/collaboration.   Lundon Rosier E Baine Decesare, MD 04/10/12 0804 

## 2012-06-05 ENCOUNTER — Encounter (HOSPITAL_COMMUNITY): Payer: Self-pay | Admitting: Emergency Medicine

## 2012-06-05 ENCOUNTER — Emergency Department (HOSPITAL_COMMUNITY)
Admission: EM | Admit: 2012-06-05 | Discharge: 2012-06-05 | Disposition: A | Discharge status: Home / Self Care | Payer: Medicaid Other | Attending: Emergency Medicine | Admitting: Emergency Medicine

## 2012-06-05 DIAGNOSIS — Z79899 Other long term (current) drug therapy: Secondary | ICD-10-CM | POA: Insufficient documentation

## 2012-06-05 DIAGNOSIS — R51 Headache: Secondary | ICD-10-CM | POA: Insufficient documentation

## 2012-06-05 DIAGNOSIS — Z8709 Personal history of other diseases of the respiratory system: Secondary | ICD-10-CM | POA: Insufficient documentation

## 2012-06-05 DIAGNOSIS — F17200 Nicotine dependence, unspecified, uncomplicated: Secondary | ICD-10-CM | POA: Insufficient documentation

## 2012-06-05 DIAGNOSIS — J329 Chronic sinusitis, unspecified: Secondary | ICD-10-CM

## 2012-06-05 DIAGNOSIS — F411 Generalized anxiety disorder: Secondary | ICD-10-CM | POA: Insufficient documentation

## 2012-06-05 DIAGNOSIS — I10 Essential (primary) hypertension: Secondary | ICD-10-CM | POA: Insufficient documentation

## 2012-06-05 DIAGNOSIS — F329 Major depressive disorder, single episode, unspecified: Secondary | ICD-10-CM | POA: Insufficient documentation

## 2012-06-05 DIAGNOSIS — F3289 Other specified depressive episodes: Secondary | ICD-10-CM | POA: Insufficient documentation

## 2012-06-05 DIAGNOSIS — Z8719 Personal history of other diseases of the digestive system: Secondary | ICD-10-CM | POA: Insufficient documentation

## 2012-06-05 MED ORDER — SALINE NASAL SPRAY 0.65 % NA SOLN: 1.0000 | NASAL | Status: DC | PRN

## 2012-06-05 MED ORDER — PSEUDOEPHEDRINE HCL 30 MG PO TABS: 30.0000 mg | ORAL_TABLET | Freq: Four times a day (QID) | ORAL | Status: DC | PRN

## 2012-06-05 MED ORDER — AMOXICILLIN-POT CLAVULANATE 875-125 MG PO TABS: 1.0000 | ORAL_TABLET | Freq: Two times a day (BID) | ORAL | Status: DC

## 2012-06-05 MED ORDER — KETOROLAC TROMETHAMINE 60 MG/2ML IM SOLN
60.0000 mg | Freq: Once | INTRAMUSCULAR | Status: AC
  Administered 2012-06-05: 60 mg via INTRAMUSCULAR
  Filled 2012-06-05: qty 2

## 2012-06-05 NOTE — ED Notes (Signed)
Pt reports 800 mg ibuprofen helps with pain in head but doesn't last long.

## 2012-06-05 NOTE — ED Provider Notes (Signed)
History     CSN: [id number]  Arrival date & time 06/05/12  1308   First MD Initiated Contact with Patient 06/05/12 0754      Chief Complaint  Patient presents with  . Headache  . Facial Pain    (Consider location/radiation/quality/duration/timing/severity/associated sxs/prior treatment) HPI Comments: This is a 26 year old female, who presents emergency department with chief complaint of headache and sinus pressure x2 weeks. Patient states that she has been fighting a cold. She states that she has had green discharge from her nose. She denies fever, sore throat, cough, chest pain, shortness of breath, nausea, vomiting, diarrhea, constipation. She is tried taking ibuprofen with some relief. There are no aggravating or alleviating factors. Patient states that her headache is 8/10.  The history is provided by the patient. No language interpreter was used.    Past Medical History  Diagnosis Date  . Sleep apnea   . Bronchitis   . Hypertension   . Acid reflux   . Anxiety   . Depression     Past Surgical History  Procedure Date  . Cesarean section x3  . Wisdom tooth extraction   . Foot surgery     History reviewed. No pertinent family history.  History  Substance Use Topics  . Smoking status: Current Every Day Smoker    Types: Cigarettes  . Smokeless tobacco: Not on file  . Alcohol Use: Yes     Comment: rarely    OB History    Grav Para Term Preterm Abortions TAB SAB Ect Mult Living   4 3 3       3       Review of Systems  All other systems reviewed and are negative.    Allergies  Review of patient's allergies indicates no known allergies.  Home Medications   Current Outpatient Rx  Name  Route  Sig  Dispense  Refill  . IBUPROFEN 200 MG PO TABS   Oral   Take 400 mg by mouth every 8 (eight) hours as needed. For headache.         Marland Kitchen LURASIDONE HCL 40 MG PO TABS   Oral   Take 40 mg by mouth at bedtime.         . SERTRALINE HCL 50 MG PO TABS   Oral  Take 50 mg by mouth every morning.           BP 150/97  Pulse 77  Temp 98.6 F (37 C) (Oral)  Resp 16  SpO2 99%  LMP 06/05/2012  Physical Exam  Nursing note and vitals reviewed. Constitutional: She is oriented to person, place, and time. She appears well-developed and well-nourished.  HENT:  Head: Normocephalic and atraumatic.  Right Ear: External ear normal.  Left Ear: External ear normal.  Mouth/Throat: Oropharynx is clear and moist. No oropharyngeal exudate.       Swollen, erythematous turbinates, maxillary sinuses tender to palpation, uvula is midline, no signs of tonsillar or peritonsillar abscesses.  Eyes: Conjunctivae normal and EOM are normal. Pupils are equal, round, and reactive to light.  Neck: Normal range of motion. Neck supple.  Cardiovascular: Normal rate, regular rhythm and normal heart sounds.   Pulmonary/Chest: Effort normal and breath sounds normal. No respiratory distress. She has no wheezes. She has no rales. She exhibits no tenderness.  Abdominal: Soft. Bowel sounds are normal.  Musculoskeletal: Normal range of motion.  Neurological: She is alert and oriented to person, place, and time.  Skin: Skin is warm and dry.  Psychiatric: She has a normal mood and affect. Her behavior is normal. Judgment and thought content normal.    ED Course  Procedures (including critical care time)  Labs Reviewed - No data to display No results found.   1. Sinus headache   2. Sinusitis       MDM  26 year old female with sinusitis.  I will manage the patient's headache with Toradol in the ED, and then discharge the patient with antibiotics and OTC decongestants.  The patient understands and agrees with the plan.  8:40 AM Patient reports feeling better after Toradol.  Will discharge to home as above.       Roxy Horseman, PA-C 06/05/12 [phone]

## 2012-06-05 NOTE — ED Notes (Signed)
MD at bedside. 

## 2012-06-05 NOTE — ED Provider Notes (Signed)
Medical screening examination/treatment/procedure(s) were performed by non-physician practitioner and as supervising physician I was immediately available for consultation/collaboration.   Gavin Pound. Oletta Lamas, MD 06/05/12 [phone]

## 2012-06-05 NOTE — ED Notes (Signed)
Pt states that she has had a headache x 2 weeks.  She has had a cold and is c/o facial pressure and sinus drainage.  States mucous is green.

## 2012-06-10 ENCOUNTER — Inpatient Hospital Stay (HOSPITAL_COMMUNITY)
Admission: AD | Admit: 2012-06-10 | Discharge: 2012-06-10 | Disposition: A | Discharge status: Home / Self Care | Payer: Medicaid Other | Source: Ambulatory Visit | Attending: Obstetrics & Gynecology | Admitting: Obstetrics & Gynecology

## 2012-06-10 ENCOUNTER — Encounter (HOSPITAL_COMMUNITY): Payer: Self-pay | Admitting: *Deleted

## 2012-06-10 DIAGNOSIS — N760 Acute vaginitis: Secondary | ICD-10-CM

## 2012-06-10 DIAGNOSIS — N949 Unspecified condition associated with female genital organs and menstrual cycle: Secondary | ICD-10-CM | POA: Insufficient documentation

## 2012-06-10 DIAGNOSIS — A499 Bacterial infection, unspecified: Secondary | ICD-10-CM | POA: Insufficient documentation

## 2012-06-10 DIAGNOSIS — B9689 Other specified bacterial agents as the cause of diseases classified elsewhere: Secondary | ICD-10-CM

## 2012-06-10 HISTORY — DX: Reserved for concepts with insufficient information to code with codable children: IMO0002

## 2012-06-10 HISTORY — DX: Chlamydial infection, unspecified: A74.9

## 2012-06-10 HISTORY — DX: Gonococcal infection, unspecified: A54.9

## 2012-06-10 HISTORY — DX: Trichomoniasis, unspecified: A59.9

## 2012-06-10 HISTORY — DX: Urinary tract infection, site not specified: N39.0

## 2012-06-10 HISTORY — DX: Personal history of other diseases of the female genital tract: Z87.42

## 2012-06-10 HISTORY — DX: Gestational (pregnancy-induced) hypertension without significant proteinuria, unspecified trimester: O13.9

## 2012-06-10 HISTORY — DX: Unspecified abnormal cytological findings in specimens from cervix uteri: R87.619

## 2012-06-10 LAB — WET PREP, GENITAL
Trich, Wet Prep: NONE SEEN
Yeast Wet Prep HPF POC: NONE SEEN

## 2012-06-10 MED ORDER — METRONIDAZOLE 500 MG PO TABS: 500.0000 mg | ORAL_TABLET | Freq: Two times a day (BID) | ORAL | Status: DC

## 2012-06-10 MED ORDER — ACETAMINOPHEN 325 MG PO TABS
650.0000 mg | ORAL_TABLET | Freq: Once | ORAL | Status: AC
  Administered 2012-06-10: 650 mg via ORAL
  Filled 2012-06-10: qty 2
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
## 2012-06-10 NOTE — MAU Note (Signed)
Patient states she has had a vaginal itching with some dark bleeding.

## 2012-06-10 NOTE — MAU Provider Note (Signed)
Attestation of Attending Supervision of Advanced Practitioner (CNM/NP): Evaluation and management procedures were performed by the Advanced Practitioner under my supervision and collaboration.  I have reviewed the Advanced Practitioner's note and chart, and I agree with the management and plan.  HARRAWAY-SMITH, Jakobee Brackins 5:42 PM     

## 2012-06-11 LAB — GC/CHLAMYDIA PROBE AMP
CT Probe RNA: NEGATIVE
GC Probe RNA: NEGATIVE

## 2012-08-22 ENCOUNTER — Encounter (HOSPITAL_COMMUNITY): Payer: Self-pay | Admitting: Physical Medicine and Rehabilitation

## 2012-08-22 ENCOUNTER — Emergency Department (HOSPITAL_COMMUNITY)
Admission: EM | Admit: 2012-08-22 | Discharge: 2012-08-22 | Disposition: A | Discharge status: Home / Self Care | Payer: Medicaid Other | Attending: Emergency Medicine | Admitting: Emergency Medicine

## 2012-08-22 DIAGNOSIS — S0993XA Unspecified injury of face, initial encounter: Secondary | ICD-10-CM | POA: Insufficient documentation

## 2012-08-22 DIAGNOSIS — F329 Major depressive disorder, single episode, unspecified: Secondary | ICD-10-CM | POA: Insufficient documentation

## 2012-08-22 DIAGNOSIS — S199XXA Unspecified injury of neck, initial encounter: Secondary | ICD-10-CM | POA: Insufficient documentation

## 2012-08-22 DIAGNOSIS — S3981XA Other specified injuries of abdomen, initial encounter: Secondary | ICD-10-CM | POA: Insufficient documentation

## 2012-08-22 DIAGNOSIS — Z87440 Personal history of urinary (tract) infections: Secondary | ICD-10-CM | POA: Insufficient documentation

## 2012-08-22 DIAGNOSIS — Y92410 Unspecified street and highway as the place of occurrence of the external cause: Secondary | ICD-10-CM | POA: Insufficient documentation

## 2012-08-22 DIAGNOSIS — S298XXA Other specified injuries of thorax, initial encounter: Secondary | ICD-10-CM | POA: Insufficient documentation

## 2012-08-22 DIAGNOSIS — Z79899 Other long term (current) drug therapy: Secondary | ICD-10-CM | POA: Insufficient documentation

## 2012-08-22 DIAGNOSIS — Z8709 Personal history of other diseases of the respiratory system: Secondary | ICD-10-CM | POA: Insufficient documentation

## 2012-08-22 DIAGNOSIS — IMO0002 Reserved for concepts with insufficient information to code with codable children: Secondary | ICD-10-CM | POA: Insufficient documentation

## 2012-08-22 DIAGNOSIS — Z8619 Personal history of other infectious and parasitic diseases: Secondary | ICD-10-CM | POA: Insufficient documentation

## 2012-08-22 DIAGNOSIS — Y9389 Activity, other specified: Secondary | ICD-10-CM | POA: Insufficient documentation

## 2012-08-22 DIAGNOSIS — Z8719 Personal history of other diseases of the digestive system: Secondary | ICD-10-CM | POA: Insufficient documentation

## 2012-08-22 DIAGNOSIS — Z8742 Personal history of other diseases of the female genital tract: Secondary | ICD-10-CM | POA: Insufficient documentation

## 2012-08-22 DIAGNOSIS — T148XXA Other injury of unspecified body region, initial encounter: Secondary | ICD-10-CM

## 2012-08-22 DIAGNOSIS — F17200 Nicotine dependence, unspecified, uncomplicated: Secondary | ICD-10-CM | POA: Insufficient documentation

## 2012-08-22 DIAGNOSIS — F3289 Other specified depressive episodes: Secondary | ICD-10-CM | POA: Insufficient documentation

## 2012-08-22 DIAGNOSIS — F411 Generalized anxiety disorder: Secondary | ICD-10-CM | POA: Insufficient documentation

## 2012-08-22 DIAGNOSIS — I10 Essential (primary) hypertension: Secondary | ICD-10-CM | POA: Insufficient documentation

## 2012-08-22 MED ORDER — CYCLOBENZAPRINE HCL 10 MG PO TABS: 10.0000 mg | ORAL_TABLET | Freq: Two times a day (BID) | ORAL | Status: DC | PRN

## 2012-08-22 MED ORDER — PREDNISONE 10 MG PO TABS: 20.0000 mg | ORAL_TABLET | Freq: Every day | ORAL | Status: DC

## 2012-08-22 MED ORDER — OXYCODONE HCL 5 MG PO TABS: 5.0000 mg | ORAL_TABLET | ORAL | Status: DC | PRN
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
## 2012-08-24 NOTE — ED Provider Notes (Signed)
Medical screening examination/treatment/procedure(s) were performed by non-physician practitioner and as supervising physician I was immediately available for consultation/collaboration.   Kadarius Cuffe M Aveleen Nevers, MD 08/24/12 1725 

## 2012-09-01 ENCOUNTER — Emergency Department (HOSPITAL_COMMUNITY)
Admission: EM | Admit: 2012-09-01 | Discharge: 2012-09-01 | Disposition: A | Discharge status: Home / Self Care | Payer: Medicaid Other | Attending: Emergency Medicine | Admitting: Emergency Medicine

## 2012-09-01 ENCOUNTER — Emergency Department (HOSPITAL_COMMUNITY): Payer: Medicaid Other

## 2012-09-01 ENCOUNTER — Encounter (HOSPITAL_COMMUNITY): Payer: Self-pay | Admitting: Emergency Medicine

## 2012-09-01 DIAGNOSIS — Z87440 Personal history of urinary (tract) infections: Secondary | ICD-10-CM | POA: Insufficient documentation

## 2012-09-01 DIAGNOSIS — Z8742 Personal history of other diseases of the female genital tract: Secondary | ICD-10-CM | POA: Insufficient documentation

## 2012-09-01 DIAGNOSIS — S8001XA Contusion of right knee, initial encounter: Secondary | ICD-10-CM

## 2012-09-01 DIAGNOSIS — F411 Generalized anxiety disorder: Secondary | ICD-10-CM | POA: Insufficient documentation

## 2012-09-01 DIAGNOSIS — F319 Bipolar disorder, unspecified: Secondary | ICD-10-CM | POA: Insufficient documentation

## 2012-09-01 DIAGNOSIS — G4730 Sleep apnea, unspecified: Secondary | ICD-10-CM | POA: Insufficient documentation

## 2012-09-01 DIAGNOSIS — W010XXA Fall on same level from slipping, tripping and stumbling without subsequent striking against object, initial encounter: Secondary | ICD-10-CM | POA: Insufficient documentation

## 2012-09-01 DIAGNOSIS — S93409A Sprain of unspecified ligament of unspecified ankle, initial encounter: Secondary | ICD-10-CM | POA: Insufficient documentation

## 2012-09-01 DIAGNOSIS — Z8719 Personal history of other diseases of the digestive system: Secondary | ICD-10-CM | POA: Insufficient documentation

## 2012-09-01 DIAGNOSIS — Z8619 Personal history of other infectious and parasitic diseases: Secondary | ICD-10-CM | POA: Insufficient documentation

## 2012-09-01 DIAGNOSIS — E669 Obesity, unspecified: Secondary | ICD-10-CM | POA: Insufficient documentation

## 2012-09-01 DIAGNOSIS — IMO0002 Reserved for concepts with insufficient information to code with codable children: Secondary | ICD-10-CM | POA: Insufficient documentation

## 2012-09-01 DIAGNOSIS — S8000XA Contusion of unspecified knee, initial encounter: Secondary | ICD-10-CM | POA: Insufficient documentation

## 2012-09-01 DIAGNOSIS — S80211A Abrasion, right knee, initial encounter: Secondary | ICD-10-CM

## 2012-09-01 DIAGNOSIS — Y939 Activity, unspecified: Secondary | ICD-10-CM | POA: Insufficient documentation

## 2012-09-01 DIAGNOSIS — I10 Essential (primary) hypertension: Secondary | ICD-10-CM | POA: Insufficient documentation

## 2012-09-01 DIAGNOSIS — F17200 Nicotine dependence, unspecified, uncomplicated: Secondary | ICD-10-CM | POA: Insufficient documentation

## 2012-09-01 DIAGNOSIS — Y929 Unspecified place or not applicable: Secondary | ICD-10-CM | POA: Insufficient documentation

## 2012-09-01 DIAGNOSIS — Z8709 Personal history of other diseases of the respiratory system: Secondary | ICD-10-CM | POA: Insufficient documentation

## 2012-09-01 HISTORY — DX: Obesity, unspecified: E66.9

## 2012-09-01 MED ORDER — PREDNISONE 20 MG PO TABS
20.0000 mg | ORAL_TABLET | Freq: Once | ORAL | Status: AC
  Administered 2012-09-01: 20 mg via ORAL
  Filled 2012-09-01: qty 1

## 2012-09-01 NOTE — ED Provider Notes (Signed)
Medical screening examination/treatment/procedure(s) were performed by non-physician practitioner and as supervising physician I was immediately available for consultation/collaboration.  Sunnie Nielsen, MD 09/01/12 [phone]
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
## 2012-09-01 NOTE — Progress Notes (Signed)
Orthopedic Tech Progress Note Patient Details:  [name] Aug 14, 1985 [id number]  Ortho Devices Type of Ortho Device: ASO;Crutches   Haskell Flirt 09/01/2012, 3:14 AM

## 2012-09-01 NOTE — ED Provider Notes (Signed)
History     CSN: [id number]  Arrival date & time 09/01/12  0200   First MD Initiated Contact with Patient 09/01/12 [phone]      Chief Complaint  Patient presents with  . Ankle Pain   HPI  Hx provided by pt.  Pt is a 27 yo obese AAF with hx of HTN who presents with complaints of left ankle and right knee pain after fall and injury.  Pt reports stepping in a hole from missing brick 2 nights ago (24hrs) with her left foot.  She inverted her foot which also caused her to fall forward onto her right knee.  She denies any other injury from fall.  No LOC.  Pt was ambulatory and continues to be but has increasing pains.  She has not used any treatments.  Pains are worse with movements.  Symptoms also associated with some swelling.  Denies any other symptoms.  No numbness or weakness.     Past Medical History  Diagnosis Date  . Sleep apnea   . Bronchitis   . Hypertension   . Acid reflux   . Pregnancy induced hypertension   . Urinary tract infection   . History of PID   . Abnormal Pap smear     colposcopy, mild dysplasia, HPV  . Gonorrhea   . Chlamydia   . Trichomonas   . Depression     on meds for bipolar  . Anxiety     on meds- stable  . Obesity     Past Surgical History  Procedure Laterality Date  . Cesarean section  x3  . Wisdom tooth extraction    . Foot surgery    . Tubal ligation      Family History  Problem Relation Age of Onset  . Other Neg Hx     History  Substance Use Topics  . Smoking status: Current Every Day Smoker -- 0.25 packs/day for 1 years    Types: Cigarettes  . Smokeless tobacco: Never Used  . Alcohol Use: No     Comment: rarely    OB History   Grav Para Term Preterm Abortions TAB SAB Ect Mult Living   3 3 3       3       Review of Systems  Neurological: Negative for weakness and numbness.  All other systems reviewed and are negative.    Allergies  Acetaminophen and Ibuprofen  Home Medications   Current Outpatient Rx  Name  Route   Sig  Dispense  Refill  . cyclobenzaprine (FLEXERIL) 10 MG tablet   Oral   Take 1 tablet (10 mg total) by mouth 2 (two) times daily as needed for muscle spasms.   20 tablet   0   . Lurasidone HCl (LATUDA) 20 MG TABS   Oral   Take 20 mg by mouth at bedtime.         Marland Kitchen oxyCODONE (ROXICODONE) 5 MG immediate release tablet   Oral   Take 1 tablet (5 mg total) by mouth every 4 (four) hours as needed for pain.   15 tablet   0   . predniSONE (DELTASONE) 10 MG tablet   Oral   Take 2 tablets (20 mg total) by mouth daily.   10 tablet   0   . sertraline (ZOLOFT) 50 MG tablet   Oral   Take 50 mg by mouth every morning.           BP 140/87  Pulse 103  Temp(Src)  98.1 F (36.7 C) (Oral)  Resp 14  SpO2 100%  LMP 07/16/2012  Physical Exam  Nursing note and vitals reviewed. Constitutional: She is oriented to person, place, and time. She appears well-developed and well-nourished. No distress.  HENT:  Head: Normocephalic and atraumatic.  Eyes: Conjunctivae are normal.  Neck: Normal range of motion.  Cardiovascular: Normal rate and regular rhythm.   Pulmonary/Chest: Effort normal and breath sounds normal.  Musculoskeletal: She exhibits edema and tenderness.  Reduced ROM of left ankle secondary to pain.  Mild swelling to anterior and lateral aspects of ankle.  TTP over these areas and lateral malleolus.  Normal dorsal pedal pulses.  Normal sensations and cap refill bilaterally.  Mild swelling of right knee.  Normal ROM.  TTP over anterior knee. Healing abrasion over inferior knee.    Neurological: She is alert and oriented to person, place, and time.  Skin: Skin is warm and dry. No rash noted.  Psychiatric: She has a normal mood and affect. Her behavior is normal.    ED Course  Procedures   Dg Ankle Complete Left  09/01/2012  *RADIOLOGY REPORT*  Clinical Data: Medial ankle pain.  Twisted ankle.  LEFT ANKLE COMPLETE - 3+ VIEW  Comparison: None.  Findings: Anatomic alignment.   Ankle mortise congruent.  Talar dome intact.  No fracture.  No radiographic evidence of effusion. Pessary ossicle present over the dorsal aspect of the navicular.  IMPRESSION: No acute osseous abnormality.   Original Report Authenticated By: Andreas Newport, M.D.   Left  09/01/2012  *RADIOLOGY REPORT*  Clinical Data: Medial ankle pain.  Twisted ankle.  LEFT ANKLE COMPLETE - 3+ VIEW  Comparison: None.  Findings: Anatomic alignment.  Ankle mortise congruent.  Talar dome intact.  No fracture.  No radiographic evidence of effusion. Pessary ossicle present over the dorsal aspect of the navicular.  IMPRESSION: No acute osseous abnormality.   Original Report Authenticated By: Andreas Newport, M.D.      1. Ankle sprain, left 2. Right knee contusion 3. Right knee abrasion    MDM  2:15AM Pt seen and evaluated.  Pt appears well in no acute distress or significant discomfort.       Angus Seller, PA-C 09/01/12 [phone]

## 2012-09-01 NOTE — ED Notes (Signed)
Pt stated that last night she fell in a hole and fell forward.  She thinks she twisted her ankle.

## 2012-09-19 ENCOUNTER — Emergency Department (HOSPITAL_COMMUNITY): Payer: Medicaid Other

## 2012-09-19 ENCOUNTER — Encounter (HOSPITAL_COMMUNITY): Payer: Self-pay | Admitting: Physical Medicine and Rehabilitation

## 2012-09-19 ENCOUNTER — Emergency Department (HOSPITAL_COMMUNITY)
Admission: EM | Admit: 2012-09-19 | Discharge: 2012-09-19 | Disposition: A | Discharge status: Home / Self Care | Payer: Medicaid Other | Attending: Emergency Medicine | Admitting: Emergency Medicine

## 2012-09-19 DIAGNOSIS — F3289 Other specified depressive episodes: Secondary | ICD-10-CM | POA: Insufficient documentation

## 2012-09-19 DIAGNOSIS — Z8719 Personal history of other diseases of the digestive system: Secondary | ICD-10-CM | POA: Insufficient documentation

## 2012-09-19 DIAGNOSIS — Z8619 Personal history of other infectious and parasitic diseases: Secondary | ICD-10-CM | POA: Insufficient documentation

## 2012-09-19 DIAGNOSIS — Z3202 Encounter for pregnancy test, result negative: Secondary | ICD-10-CM | POA: Insufficient documentation

## 2012-09-19 DIAGNOSIS — F411 Generalized anxiety disorder: Secondary | ICD-10-CM | POA: Insufficient documentation

## 2012-09-19 DIAGNOSIS — Z87440 Personal history of urinary (tract) infections: Secondary | ICD-10-CM | POA: Insufficient documentation

## 2012-09-19 DIAGNOSIS — E669 Obesity, unspecified: Secondary | ICD-10-CM | POA: Insufficient documentation

## 2012-09-19 DIAGNOSIS — R112 Nausea with vomiting, unspecified: Secondary | ICD-10-CM | POA: Insufficient documentation

## 2012-09-19 DIAGNOSIS — I10 Essential (primary) hypertension: Secondary | ICD-10-CM | POA: Insufficient documentation

## 2012-09-19 DIAGNOSIS — K8020 Calculus of gallbladder without cholecystitis without obstruction: Secondary | ICD-10-CM | POA: Insufficient documentation

## 2012-09-19 DIAGNOSIS — Z8709 Personal history of other diseases of the respiratory system: Secondary | ICD-10-CM | POA: Insufficient documentation

## 2012-09-19 DIAGNOSIS — Z79899 Other long term (current) drug therapy: Secondary | ICD-10-CM | POA: Insufficient documentation

## 2012-09-19 DIAGNOSIS — F329 Major depressive disorder, single episode, unspecified: Secondary | ICD-10-CM | POA: Insufficient documentation

## 2012-09-19 DIAGNOSIS — G4730 Sleep apnea, unspecified: Secondary | ICD-10-CM | POA: Insufficient documentation

## 2012-09-19 DIAGNOSIS — Z8742 Personal history of other diseases of the female genital tract: Secondary | ICD-10-CM | POA: Insufficient documentation

## 2012-09-19 DIAGNOSIS — F17200 Nicotine dependence, unspecified, uncomplicated: Secondary | ICD-10-CM | POA: Insufficient documentation

## 2012-09-19 LAB — URINALYSIS, ROUTINE W REFLEX MICROSCOPIC
Bilirubin Urine: NEGATIVE
Glucose, UA: NEGATIVE mg/dL
Ketones, ur: NEGATIVE mg/dL
Leukocytes, UA: NEGATIVE
Nitrite: NEGATIVE
Protein, ur: NEGATIVE mg/dL
Specific Gravity, Urine: 1.024 (ref 1.005–1.030)
Urobilinogen, UA: 1 mg/dL (ref 0.0–1.0)
pH: 6 (ref 5.0–8.0)

## 2012-09-19 LAB — COMPREHENSIVE METABOLIC PANEL
ALT: 9 U/L (ref 0–35)
AST: 13 U/L (ref 0–37)
Albumin: 3.3 g/dL — ABNORMAL LOW (ref 3.5–5.2)
Alkaline Phosphatase: 78 U/L (ref 39–117)
BUN: 7 mg/dL (ref 6–23)
CO2: 26 mEq/L (ref 19–32)
Calcium: 9.1 mg/dL (ref 8.4–10.5)
Chloride: 105 mEq/L (ref 96–112)
Creatinine, Ser: 0.61 mg/dL (ref 0.50–1.10)
GFR calc Af Amer: >90 mL/min (ref >90)
GFR calc non Af Amer: >90 mL/min (ref >90)
Glucose, Bld: 107 mg/dL — ABNORMAL HIGH (ref 70–99)
Potassium: 3 mEq/L — ABNORMAL LOW (ref 3.5–5.1)
Sodium: 141 mEq/L (ref 135–145)
Total Bilirubin: 0.2 mg/dL — ABNORMAL LOW (ref 0.3–1.2)
Total Protein: 7.4 g/dL (ref 6.0–8.3)

## 2012-09-19 LAB — URINE MICROSCOPIC-ADD ON

## 2012-09-19 LAB — CBC WITH DIFFERENTIAL/PLATELET
Basophils Absolute: 0 10*3/uL (ref 0.0–0.1)
Basophils Relative: 0 % (ref 0–1)
Eosinophils Absolute: 0.3 10*3/uL (ref 0.0–0.7)
Eosinophils Relative: 2 % (ref 0–5)
HCT: 35.6 % — ABNORMAL LOW (ref 36.0–46.0)
Hemoglobin: 12.2 g/dL (ref 12.0–15.0)
Lymphocytes Relative: 21 % (ref 12–46)
Lymphs Abs: 2.8 10*3/uL (ref 0.7–4.0)
MCH: 27.9 pg (ref 26.0–34.0)
MCHC: 34.3 g/dL (ref 30.0–36.0)
MCV: 81.5 fL (ref 78.0–100.0)
Monocytes Absolute: 0.5 10*3/uL (ref 0.1–1.0)
Monocytes Relative: 4 % (ref 3–12)
Neutro Abs: 9.9 10*3/uL — ABNORMAL HIGH (ref 1.7–7.7)
Neutrophils Relative %: 73 % (ref 43–77)
Platelets: 352 10*3/uL (ref 150–400)
RBC: 4.37 MIL/uL (ref 3.87–5.11)
RDW: 13.7 % (ref 11.5–15.5)
WBC: 13.4 10*3/uL — ABNORMAL HIGH (ref 4.0–10.5)

## 2012-09-19 LAB — LIPASE, BLOOD: Lipase: 22 U/L (ref 11–59)

## 2012-09-19 LAB — POCT PREGNANCY, URINE: Preg Test, Ur: NEGATIVE

## 2012-09-19 MED ORDER — OXYCODONE HCL 5 MG PO TABS: 5.0000 mg | ORAL_TABLET | ORAL | Status: DC | PRN

## 2012-09-19 MED ORDER — PROMETHAZINE HCL 25 MG PO TABS: 25.0000 mg | ORAL_TABLET | Freq: Four times a day (QID) | ORAL | Status: DC | PRN

## 2012-09-19 MED ORDER — GI COCKTAIL ~~LOC~~
30.0000 mL | Freq: Once | ORAL | Status: AC
  Administered 2012-09-19: 30 mL via ORAL
  Filled 2012-09-19: qty 30

## 2012-09-19 NOTE — ED Notes (Signed)
Pt d/c'd from monitor and continuous pulse oximetry; pt getting dressed to be discharged home; son at bedside

## 2012-09-19 NOTE — ED Provider Notes (Signed)
History     CSN: [id number]  Arrival date & time 09/19/12  1139   First MD Initiated Contact with Patient 09/19/12 1250      Chief Complaint  Patient presents with  . Abdominal Pain  . Emesis    (Consider location/radiation/quality/duration/timing/severity/associated sxs/prior treatment) HPI..... sharp upper abdominal pain or several days.  Nothing makes symptoms better or worse. No radiation. Minimal nausea and vomiting. Patient smokes cigarettes. Minimal alcohol. Normal urination and bowel movement. No fever sweats or chills.   Past Medical History  Diagnosis Date  . Sleep apnea   . Bronchitis   . Hypertension   . Acid reflux   . Pregnancy induced hypertension   . Urinary tract infection   . History of PID   . Abnormal Pap smear     colposcopy, mild dysplasia, HPV  . Gonorrhea   . Chlamydia   . Trichomonas   . Depression     on meds for bipolar  . Anxiety     on meds- stable  . Obesity     Past Surgical History  Procedure Laterality Date  . Cesarean section  x3  . Wisdom tooth extraction    . Foot surgery    . Tubal ligation      Family History  Problem Relation Age of Onset  . Other Neg Hx     History  Substance Use Topics  . Smoking status: Current Every Day Smoker -- 0.25 packs/day for 1 years    Types: Cigarettes  . Smokeless tobacco: Never Used  . Alcohol Use: No     Comment: rarely    OB History   Grav Para Term Preterm Abortions TAB SAB Ect Mult Living   3 3 3       3       Review of Systems  All other systems reviewed and are negative.    Allergies  Acetaminophen and Ibuprofen  Home Medications   Current Outpatient Rx  Name  Route  Sig  Dispense  Refill  . Lurasidone HCl (LATUDA) 20 MG TABS   Oral   Take 20 mg by mouth at bedtime.         . sertraline (ZOLOFT) 50 MG tablet   Oral   Take 50 mg by mouth every morning.         Marland Kitchen oxyCODONE (ROXICODONE) 5 MG immediate release tablet   Oral   Take 1 tablet (5 mg total) by  mouth every 4 (four) hours as needed for pain.   25 tablet   0   . promethazine (PHENERGAN) 25 MG tablet   Oral   Take 1 tablet (25 mg total) by mouth every 6 (six) hours as needed for nausea.   20 tablet   0     BP 142/103  Pulse 75  Temp(Src) 98.5 F (36.9 C) (Oral)  Resp 22  Ht 5\' 5"  (1.651 m)  Wt 290 lb (131.543 kg)  BMI 48.26 kg/m2  SpO2 95%  LMP 07/16/2012  Physical Exam  Nursing note and vitals reviewed. Constitutional: She is oriented to person, place, and time.  Obese  HENT:  Head: Normocephalic and atraumatic.  Eyes: Conjunctivae and EOM are normal. Pupils are equal, round, and reactive to light.  Neck: Normal range of motion. Neck supple.  Cardiovascular: Normal rate, regular rhythm and normal heart sounds.   Pulmonary/Chest: Effort normal and breath sounds normal.  Abdominal:  Minimal upper abdominal tenderness.  Musculoskeletal: Normal range of motion.  Neurological: She  is alert and oriented to person, place, and time.  Skin: Skin is warm and dry.  Psychiatric: She has a normal mood and affect.    ED Course  Procedures (including critical care time)  Labs Reviewed  URINALYSIS, ROUTINE W REFLEX MICROSCOPIC - Abnormal; Notable for the following:    Hgb urine dipstick MODERATE (*)    All other components within normal limits  CBC WITH DIFFERENTIAL - Abnormal; Notable for the following:    WBC 13.4 (*)    HCT 35.6 (*)    Neutro Abs 9.9 (*)    All other components within normal limits  COMPREHENSIVE METABOLIC PANEL - Abnormal; Notable for the following:    Potassium 3.0 (*)    Glucose, Bld 107 (*)    Albumin 3.3 (*)    Total Bilirubin 0.2 (*)    All other components within normal limits  URINE MICROSCOPIC-ADD ON - Abnormal; Notable for the following:    Squamous Epithelial / LPF FEW (*)    All other components within normal limits  LIPASE, BLOOD  POCT PREGNANCY, URINE   US Abdomen Complete  09/19/2012  *RADIOLOGY REPORT*  Clinical Data:   Upper abdominal pain.  COMPLETE ABDOMINAL ULTRASOUND  Comparison:  None.  Findings:  Gallbladder:  Solitary shadowing gallstone in the gallbladder approximating 14 mm.  No gallbladder wall thickening or pericholecystic fluid.  Negative sonographic Murphy's sign according to the ultrasound technologist.  Common bile duct:  Normal in caliber with maximum diameter approximating 5 mm.  Liver:  Scattered areas of focally increased and coarsened echotexture.  No focal hepatic parenchymal masses.  Patent portal vein.  IVC:  Patent.  Pancreas:  Although the pancreas is difficult to visualize in its entirety, no focal pancreatic abnormality is identified.  Spleen:  Normal size and echotexture without focal parenchymal abnormality.  Right Kidney:  No hydronephrosis.  Well-preserved cortex.  No shadowing calculi.  Normal size and parenchymal echotexture without focal abnormalities.  Approximately 12.6 cm in length.  Left Kidney:  No hydronephrosis.  Well-preserved cortex.  No shadowing calculi.  Normal size and parenchymal echotexture without focal abnormalities. Approximately 12.4 cm in length.  Abdominal aorta:  Normal in caliber throughout its visualized course in the abdomen without significant atherosclerosis.  IMPRESSION:  1.  Cholelithiasis.  No sonographic evidence of acute cholecystitis. 2.  Scattered areas of focal hepatic steatosis. 3.  Otherwise normal examination.   Original Report Authenticated By: Hulan Saas, M.D.      1. Cholelithiasis       MDM  No acute abdomen. Liver functions normal. Ultrasound reveals cholelithiasis.  Medication for pain and nausea. Referral to general surgery.        Donnetta Hutching, MD 09/19/12 [phone]

## 2012-09-19 NOTE — ED Notes (Signed)
Pt presents to department for evaluation of abdominal pain, and N/V. 7/10 pain at the time. Last BM yesterday. Denies urine issues. Also states problems with hemorrhoids. Pt is alert and oriented x4.

## 2012-09-25 ENCOUNTER — Emergency Department (HOSPITAL_COMMUNITY)
Admission: EM | Admit: 2012-09-25 | Discharge: 2012-09-25 | Disposition: A | Discharge status: Home / Self Care | Payer: Medicaid Other | Attending: Emergency Medicine | Admitting: Emergency Medicine

## 2012-09-25 ENCOUNTER — Encounter (HOSPITAL_COMMUNITY): Payer: Self-pay

## 2012-09-25 DIAGNOSIS — K8020 Calculus of gallbladder without cholecystitis without obstruction: Secondary | ICD-10-CM | POA: Insufficient documentation

## 2012-09-25 DIAGNOSIS — F411 Generalized anxiety disorder: Secondary | ICD-10-CM | POA: Insufficient documentation

## 2012-09-25 DIAGNOSIS — Z8669 Personal history of other diseases of the nervous system and sense organs: Secondary | ICD-10-CM | POA: Insufficient documentation

## 2012-09-25 DIAGNOSIS — K760 Fatty (change of) liver, not elsewhere classified: Secondary | ICD-10-CM

## 2012-09-25 DIAGNOSIS — Z79899 Other long term (current) drug therapy: Secondary | ICD-10-CM | POA: Insufficient documentation

## 2012-09-25 DIAGNOSIS — Z8719 Personal history of other diseases of the digestive system: Secondary | ICD-10-CM | POA: Insufficient documentation

## 2012-09-25 DIAGNOSIS — R109 Unspecified abdominal pain: Secondary | ICD-10-CM | POA: Insufficient documentation

## 2012-09-25 DIAGNOSIS — Z8742 Personal history of other diseases of the female genital tract: Secondary | ICD-10-CM | POA: Insufficient documentation

## 2012-09-25 DIAGNOSIS — F17200 Nicotine dependence, unspecified, uncomplicated: Secondary | ICD-10-CM | POA: Insufficient documentation

## 2012-09-25 DIAGNOSIS — K7689 Other specified diseases of liver: Secondary | ICD-10-CM | POA: Insufficient documentation

## 2012-09-25 DIAGNOSIS — Z8709 Personal history of other diseases of the respiratory system: Secondary | ICD-10-CM | POA: Insufficient documentation

## 2012-09-25 DIAGNOSIS — F329 Major depressive disorder, single episode, unspecified: Secondary | ICD-10-CM | POA: Insufficient documentation

## 2012-09-25 DIAGNOSIS — Z8619 Personal history of other infectious and parasitic diseases: Secondary | ICD-10-CM | POA: Insufficient documentation

## 2012-09-25 DIAGNOSIS — F3289 Other specified depressive episodes: Secondary | ICD-10-CM | POA: Insufficient documentation

## 2012-09-25 DIAGNOSIS — I10 Essential (primary) hypertension: Secondary | ICD-10-CM | POA: Insufficient documentation

## 2012-09-25 DIAGNOSIS — Z87440 Personal history of urinary (tract) infections: Secondary | ICD-10-CM | POA: Insufficient documentation

## 2012-09-25 DIAGNOSIS — Z3202 Encounter for pregnancy test, result negative: Secondary | ICD-10-CM | POA: Insufficient documentation

## 2012-09-25 LAB — URINALYSIS, ROUTINE W REFLEX MICROSCOPIC
Bilirubin Urine: NEGATIVE
Glucose, UA: NEGATIVE mg/dL
Hgb urine dipstick: NEGATIVE
Ketones, ur: NEGATIVE mg/dL
Leukocytes, UA: NEGATIVE
Nitrite: NEGATIVE
Protein, ur: NEGATIVE mg/dL
Specific Gravity, Urine: 1.019 (ref 1.005–1.030)
Urobilinogen, UA: 1 mg/dL (ref 0.0–1.0)
pH: 7 (ref 5.0–8.0)

## 2012-09-25 LAB — HEPATIC FUNCTION PANEL
ALT: 11 U/L (ref 0–35)
AST: 14 U/L (ref 0–37)
Albumin: 3.5 g/dL (ref 3.5–5.2)
Alkaline Phosphatase: 63 U/L (ref 39–117)
Bilirubin, Direct: <0.1 mg/dL (ref 0.0–0.3)
Total Bilirubin: 0.2 mg/dL — ABNORMAL LOW (ref 0.3–1.2)
Total Protein: 7.5 g/dL (ref 6.0–8.3)

## 2012-09-25 LAB — CBC WITH DIFFERENTIAL/PLATELET
Basophils Absolute: 0 10*3/uL (ref 0.0–0.1)
Basophils Relative: 0 % (ref 0–1)
Eosinophils Absolute: 0.3 10*3/uL (ref 0.0–0.7)
Eosinophils Relative: 2 % (ref 0–5)
HCT: 36.3 % (ref 36.0–46.0)
Hemoglobin: 12.5 g/dL (ref 12.0–15.0)
Lymphocytes Relative: 32 % (ref 12–46)
Lymphs Abs: 4 10*3/uL (ref 0.7–4.0)
MCH: 27.7 pg (ref 26.0–34.0)
MCHC: 34.4 g/dL (ref 30.0–36.0)
MCV: 80.3 fL (ref 78.0–100.0)
Monocytes Absolute: 0.6 10*3/uL (ref 0.1–1.0)
Monocytes Relative: 4 % (ref 3–12)
Neutro Abs: 7.8 10*3/uL — ABNORMAL HIGH (ref 1.7–7.7)
Neutrophils Relative %: 61 % (ref 43–77)
Platelets: 375 10*3/uL (ref 150–400)
RBC: 4.52 MIL/uL (ref 3.87–5.11)
RDW: 13.9 % (ref 11.5–15.5)
WBC: 12.7 10*3/uL — ABNORMAL HIGH (ref 4.0–10.5)

## 2012-09-25 LAB — POCT PREGNANCY, URINE: Preg Test, Ur: NEGATIVE

## 2012-09-25 LAB — BASIC METABOLIC PANEL
BUN: 8 mg/dL (ref 6–23)
CO2: 26 mEq/L (ref 19–32)
Calcium: 9 mg/dL (ref 8.4–10.5)
Chloride: 104 mEq/L (ref 96–112)
Creatinine, Ser: 0.67 mg/dL (ref 0.50–1.10)
GFR calc Af Amer: >90 mL/min (ref >90)
GFR calc non Af Amer: >90 mL/min (ref >90)
Glucose, Bld: 90 mg/dL (ref 70–99)
Potassium: 3.5 mEq/L (ref 3.5–5.1)
Sodium: 137 mEq/L (ref 135–145)

## 2012-09-25 LAB — LIPASE, BLOOD: Lipase: 23 U/L (ref 11–59)

## 2012-09-25 MED ORDER — OXYCODONE HCL 5 MG PO TABS: 5.0000 mg | ORAL_TABLET | ORAL | Status: DC | PRN

## 2012-09-25 MED ORDER — ONDANSETRON 4 MG PO TBDP
4.0000 mg | ORAL_TABLET | Freq: Once | ORAL | Status: AC
  Administered 2012-09-25: 4 mg via ORAL
  Filled 2012-09-25: qty 1

## 2012-09-25 MED ORDER — ONDANSETRON HCL 4 MG PO TABS: 4.0000 mg | ORAL_TABLET | Freq: Three times a day (TID) | ORAL | Status: DC | PRN

## 2012-09-25 MED ORDER — OXYCODONE-ACETAMINOPHEN 5-325 MG PO TABS
2.0000 | ORAL_TABLET | Freq: Once | ORAL | Status: AC
  Administered 2012-09-25: 2 via ORAL
  Filled 2012-09-25: qty 2

## 2012-09-25 NOTE — ED Notes (Addendum)
Patent presents with intermittent epigastric, dull, achy pain x 1 week, especially after eating.  Patient reporting nausea and vomiting x 1 week as well.  LMP 09/09/12. Patient denies vaginal discharge/bleeding.

## 2012-09-25 NOTE — ED Provider Notes (Signed)
History     CSN: [id number]  Arrival date & time 09/25/12  1426   First MD Initiated Contact with Patient 09/25/12 1532      Chief Complaint  Patient presents with  . Abdominal Pain    (Consider location/radiation/quality/duration/timing/severity/associated sxs/prior treatment) HPI  27 year old female presents to the emergency department with chief complaint of abdominal pain.  She was seen here September 19 2012 for the same complaint and diagnosed with cholelithiasis.  Patient states that she did feel her oxycodone which gave her significant relief.  She was unaware that she was supposed to follow up at [facility] surgery and lost her paperwork.  She also did not fill her prescription for promethazine and has had constant mild nausea.  She states that her pain and nausea is worsened after eating and smoking.  She has had significant reflux.  She states that she vomits approximately 3 times a day.  She denies diarrhea but has had some constipation. Denies fevers, chills, myalgias, arthralgias. Denies DOE, SOB, chest tightness or pressure, radiation to left arm, jaw or back, or diaphoresis. Denies dysuria, flank pain, suprapubic pain, frequency, urgency, or hematuria.  Denies vaginal symptoms.  Denies headaches, light headedness, weakness, visual disturbances.     Past Medical History  Diagnosis Date  . Sleep apnea   . Bronchitis   . Hypertension   . Acid reflux   . Pregnancy induced hypertension   . Urinary tract infection   . History of PID   . Abnormal Pap smear     colposcopy, mild dysplasia, HPV  . Gonorrhea   . Chlamydia   . Trichomonas   . Depression     on meds for bipolar  . Anxiety     on meds- stable  . Obesity     Past Surgical History  Procedure Laterality Date  . Cesarean section  x3  . Wisdom tooth extraction    . Foot surgery    . Tubal ligation      Family History  Problem Relation Age of Onset  . Other Neg Hx     History  Substance Use  Topics  . Smoking status: Current Every Day Smoker -- 0.25 packs/day for 1 years    Types: Cigarettes  . Smokeless tobacco: Never Used  . Alcohol Use: No     Comment: rarely    OB History   Grav Para Term Preterm Abortions TAB SAB Ect Mult Living   3 3 3       3       Review of Systems Ten systems reviewed and are negative for acute change, except as noted in the HPI.   Allergies  Acetaminophen and Ibuprofen  Home Medications   Current Outpatient Rx  Name  Route  Sig  Dispense  Refill  . Lurasidone HCl (LATUDA) 20 MG TABS   Oral   Take 20 mg by mouth at bedtime.         Marland Kitchen oxyCODONE (OXY IR/ROXICODONE) 5 MG immediate release tablet   Oral   Take 5 mg by mouth every 4 (four) hours as needed for pain. For pain         . sertraline (ZOLOFT) 50 MG tablet   Oral   Take 50 mg by mouth every morning.           BP 133/71  Pulse 87  Temp(Src) 98.4 F (36.9 C) (Oral)  Resp 18  SpO2 99%  LMP 09/09/2012  Physical Exam Physical Exam  Nursing note and vitals reviewed. Constitutional: She is oriented to person, place, and time. She appears well-developed and well-nourished. No distress.  Morbidly obese HENT:  Head: Normocephalic and atraumatic.  Eyes: Conjunctivae normal and EOM are normal. Pupils are equal, round, and reactive to light. No scleral icterus.  Neck: Normal range of motion.  Cardiovascular: Normal rate, regular rhythm and normal heart sounds.  Exam reveals no gallop and no friction rub.   No murmur heard. Pulmonary/Chest: Effort normal and breath sounds normal. No respiratory distress.  Abdominal: Soft.  Obese abdomen.  Bowel sounds are normal. She exhibits no distension and no mass.  Moderately tender to palpation in the epigastrium and right upper quadrant. Neurological: She is alert and oriented to person, place, and time.  Skin: Skin is warm and dry. She is not diaphoretic.    ED Course  Procedures (including critical care time)  Labs Reviewed   CBC WITH DIFFERENTIAL - Abnormal; Notable for the following:    WBC 12.7 (*)    Neutro Abs 7.8 (*)    All other components within normal limits  BASIC METABOLIC PANEL  URINALYSIS, ROUTINE W REFLEX MICROSCOPIC  HEPATIC FUNCTION PANEL  LIPASE, BLOOD   No results found.   1. Abdominal pain   2. Cholelithiasis   3. Fatty liver       MDM  3:51 PM Filed Vitals:   09/25/12 1432 09/25/12 1505 09/25/12 1507  BP: 158/107 133/71   Pulse: 70  87  Temp: 98.4 F (36.9 C)    TempSrc: Oral    Resp: 18    SpO2: 98%  99%    Patient here with continued abdominal pain. She lost here paperwork and  Did not know that was referred to [facility] surgery.  She is anomaly here for pain relief.  Patient's vital signs are stable at this time.  Blood work was repeated in triage.   Patient requesting pain medication. I have ordered percocet.  Tylenol is listed as an allergy on the patient's chart, however she states that she has taken percocet without any complications.   5:29 PM BP 121/82  Pulse 85  Temp(Src) 98.4 F (36.9 C) (Oral)  Resp 18  SpO2 100%  LMP 09/09/2012 Patient work up is negative for acute change since 09/19/2012. No elebvation in the patient's alk phos. Lipase or liver enzymes. Leukocytosis is trending downward.  Patient will be explicitly instructed to f/u with [facility].  She has tolerated fluids and oral pain medicine here in the ED without incident. I do not feel that it is indicated at this time. Patient is nontoxic, nonseptic appearing, in no apparent distress.  Patient's pain and other symptoms adequately managed in emergency department.  The patient appears reasonably screened and/or stabilized for discharge and I doubt any other medical condition or other [facility] requiring further screening, evaluation, or treatment in the ED at this time prior to discharge.         Arthor Captain, PA-C 09/27/12 2224

## 2012-09-27 ENCOUNTER — Encounter (HOSPITAL_COMMUNITY): Payer: Self-pay | Admitting: Emergency Medicine

## 2012-09-27 ENCOUNTER — Emergency Department (HOSPITAL_COMMUNITY)
Admission: EM | Admit: 2012-09-27 | Discharge: 2012-09-27 | Disposition: A | Discharge status: Home / Self Care | Payer: Medicaid Other | Attending: Emergency Medicine | Admitting: Emergency Medicine

## 2012-09-27 DIAGNOSIS — T50995A Adverse effect of other drugs, medicaments and biological substances, initial encounter: Secondary | ICD-10-CM | POA: Insufficient documentation

## 2012-09-27 DIAGNOSIS — L509 Urticaria, unspecified: Secondary | ICD-10-CM

## 2012-09-27 DIAGNOSIS — Z79899 Other long term (current) drug therapy: Secondary | ICD-10-CM | POA: Insufficient documentation

## 2012-09-27 DIAGNOSIS — R1310 Dysphagia, unspecified: Secondary | ICD-10-CM | POA: Insufficient documentation

## 2012-09-27 DIAGNOSIS — F3289 Other specified depressive episodes: Secondary | ICD-10-CM | POA: Insufficient documentation

## 2012-09-27 DIAGNOSIS — Z87440 Personal history of urinary (tract) infections: Secondary | ICD-10-CM | POA: Insufficient documentation

## 2012-09-27 DIAGNOSIS — K219 Gastro-esophageal reflux disease without esophagitis: Secondary | ICD-10-CM | POA: Insufficient documentation

## 2012-09-27 DIAGNOSIS — F329 Major depressive disorder, single episode, unspecified: Secondary | ICD-10-CM | POA: Insufficient documentation

## 2012-09-27 DIAGNOSIS — T7840XA Allergy, unspecified, initial encounter: Secondary | ICD-10-CM

## 2012-09-27 DIAGNOSIS — T391X5A Adverse effect of 4-Aminophenol derivatives, initial encounter: Secondary | ICD-10-CM | POA: Insufficient documentation

## 2012-09-27 DIAGNOSIS — F411 Generalized anxiety disorder: Secondary | ICD-10-CM | POA: Insufficient documentation

## 2012-09-27 DIAGNOSIS — I10 Essential (primary) hypertension: Secondary | ICD-10-CM | POA: Insufficient documentation

## 2012-09-27 DIAGNOSIS — Z8709 Personal history of other diseases of the respiratory system: Secondary | ICD-10-CM | POA: Insufficient documentation

## 2012-09-27 DIAGNOSIS — Z8619 Personal history of other infectious and parasitic diseases: Secondary | ICD-10-CM | POA: Insufficient documentation

## 2012-09-27 DIAGNOSIS — F17200 Nicotine dependence, unspecified, uncomplicated: Secondary | ICD-10-CM | POA: Insufficient documentation

## 2012-09-27 DIAGNOSIS — G4730 Sleep apnea, unspecified: Secondary | ICD-10-CM | POA: Insufficient documentation

## 2012-09-27 MED ORDER — FAMOTIDINE 20 MG PO TABS
20.0000 mg | ORAL_TABLET | Freq: Once | ORAL | Status: AC
  Administered 2012-09-27: 20 mg via ORAL
  Filled 2012-09-27: qty 1

## 2012-09-27 MED ORDER — DIPHENHYDRAMINE HCL 25 MG PO CAPS
25.0000 mg | ORAL_CAPSULE | Freq: Once | ORAL | Status: AC
  Administered 2012-09-27: 25 mg via ORAL
  Filled 2012-09-27: qty 1

## 2012-09-27 MED ORDER — PREDNISONE 20 MG PO TABS: ORAL_TABLET | ORAL | Status: DC

## 2012-09-27 MED ORDER — DIPHENHYDRAMINE HCL 25 MG PO TABS: 25.0000 mg | ORAL_TABLET | Freq: Four times a day (QID) | ORAL | Status: DC

## 2012-09-27 MED ORDER — FAMOTIDINE 20 MG PO TABS: 20.0000 mg | ORAL_TABLET | Freq: Two times a day (BID) | ORAL | Status: DC

## 2012-09-27 MED ORDER — PREDNISONE 20 MG PO TABS
60.0000 mg | ORAL_TABLET | Freq: Once | ORAL | Status: AC
  Administered 2012-09-27: 60 mg via ORAL
  Filled 2012-09-27: qty 3

## 2012-09-27 NOTE — ED Provider Notes (Signed)
History     CSN: [id number]  Arrival date & time 09/27/12  4098   First MD Initiated Contact with Patient 09/27/12 0703      Chief Complaint  Patient presents with  . Allergic Reaction    (Consider location/radiation/quality/duration/timing/severity/associated sxs/prior treatment) HPI Comments: 27 y/o morbidly obese female with a PMHx of HTN, OSA among multiple other medical problems presents to the ED complaining f an allergic reaction to the percocet she was given in the ED 2 days ago. Patient was seen in the ED 2 days prior for pain control for gallstones diagnosed on 4/13. States she is allergic to both tylenol and ibuprofen with resulting hives. Hives began early yesterday and worsened into today throughout her body mostly on forearms, chest and calves. Denies difficulty breathing or wheezing, but states it "feels funny" to swallow and lips swelling. Rash is itchy and she tried placing cool compresses on area without any relief. Abdominal pain not as severe as when she was in the ED 2 days prior. Has paperwork to follow up with [facility].  Patient is a 27 y.o. female presenting with allergic reaction. The history is provided by the patient.  Allergic Reaction The primary symptoms are  rash. The primary symptoms do not include wheezing, shortness of breath, nausea or vomiting.    Past Medical History  Diagnosis Date  . Sleep apnea   . Bronchitis   . Hypertension   . Acid reflux   . Pregnancy induced hypertension   . Urinary tract infection   . History of PID   . Abnormal Pap smear     colposcopy, mild dysplasia, HPV  . Gonorrhea   . Chlamydia   . Trichomonas   . Depression     on meds for bipolar  . Anxiety     on meds- stable  . Obesity     Past Surgical History  Procedure Laterality Date  . Cesarean section  x3  . Wisdom tooth extraction    . Foot surgery    . Tubal ligation      Family History  Problem Relation Age of Onset  . Other Neg Hx     History   Substance Use Topics  . Smoking status: Current Every Day Smoker -- 0.25 packs/day for 1 years    Types: Cigarettes  . Smokeless tobacco: Never Used  . Alcohol Use: No     Comment: rarely    OB History   Grav Para Term Preterm Abortions TAB SAB Ect Mult Living   3 3 3       3       Review of Systems  Constitutional: Negative for fever, chills and diaphoresis.  HENT: Positive for facial swelling and trouble swallowing.   Respiratory: Negative for shortness of breath and wheezing.   Cardiovascular: Negative for chest pain.  Gastrointestinal: Negative for nausea and vomiting.  Skin: Positive for rash.  All other systems reviewed and are negative.    Allergies  Acetaminophen and Ibuprofen  Home Medications   Current Outpatient Rx  Name  Route  Sig  Dispense  Refill  . Lurasidone HCl (LATUDA) 20 MG TABS   Oral   Take 20 mg by mouth at bedtime.         . ondansetron (ZOFRAN) 4 MG tablet   Oral   Take 1 tablet (4 mg total) by mouth every 8 (eight) hours as needed for nausea.   10 tablet   0   . oxyCODONE (OXY IR/ROXICODONE)  5 MG immediate release tablet   Oral   Take 1 tablet (5 mg total) by mouth every 4 (four) hours as needed for pain. For pain   20 tablet   0   . sertraline (ZOLOFT) 50 MG tablet   Oral   Take 50 mg by mouth every morning.           BP 158/109  Pulse 87  Temp(Src) 98.6 F (37 C) (Oral)  Resp 14  SpO2 96%  LMP 09/09/2012  Physical Exam  Nursing note and vitals reviewed. Constitutional: She is oriented to person, place, and time. She appears well-developed. No distress.  Morbidly obese.  HENT:  Head: Normocephalic and atraumatic.  Mouth/Throat: Uvula is midline, oropharynx is clear and moist and mucous membranes are normal. No edematous.  Eyes: Conjunctivae and EOM are normal.  Neck: Normal range of motion. Neck supple.  Cardiovascular: Normal rate, regular rhythm, normal heart sounds and intact distal pulses.   Pulmonary/Chest:  Effort normal and breath sounds normal. No respiratory distress. She has no wheezes.  Abdominal: Soft. Bowel sounds are normal.  Musculoskeletal: Normal range of motion. She exhibits no edema.  Lymphadenopathy:    She has no cervical adenopathy.  Neurological: She is alert and oriented to person, place, and time.  Skin: Skin is warm, dry and intact. Rash noted. Rash is urticarial (scarcely scattered on chest, forearms, calves). She is not diaphoretic.  Psychiatric: She has a normal mood and affect. Her behavior is normal.    ED Course  Procedures (including critical care time)  Labs Reviewed - No data to display No results found.   1. Urticaria   2. Allergic reaction caused by a drug       MDM  Urticarial reaction to acetaminophen. No respiratory/airway compromise. She is in NAD. Normal vitals. Giving 60mg  PO prednisone, benadryl, pepcid and will re-assess. Case discussed with Dr. Patria Mane who agrees with plan of care. 7:59 AM Patient reports decreased itching since receiving benadryl, pepcid. She is in NAD. Discussed diet to avoid gallbladder flare up. Discharge with pepcid, benadryl, prednisone. Return precautions discussed. Patient states understanding of plan and is agreeable.       Trevor Mace, PA-C 09/27/12 [phone]

## 2012-09-27 NOTE — ED Notes (Signed)
Pt states that she took a percocet yesterday and that she is allergic to Acetaminophen. Pt is c/o itching with rash on forearms chest and calves. No difficulty breathing

## 2012-09-27 NOTE — ED Notes (Signed)
PT. REPORTS ALLERGIC REACTION ONSET YESTERDAY WITH GENERALIZED ITCHY RASHES/HIVES , RESPIRATIONS UNLABORED / AIRWAY INTACT.

## 2012-09-27 NOTE — ED Provider Notes (Signed)
Medical screening examination/treatment/procedure(s) were conducted as a shared visit with non-physician practitioner(s) and myself.  I personally evaluated the patient during the encounter  Well appearing. Improvement in symptoms. Dc home with allergic reaction meds. Told to return to ER for new or worsening symptoms  Lyanne Co, MD 09/27/12 [phone]

## 2012-09-29 ENCOUNTER — Emergency Department (HOSPITAL_COMMUNITY): Payer: Medicaid Other

## 2012-09-29 ENCOUNTER — Emergency Department (HOSPITAL_COMMUNITY)
Admission: EM | Admit: 2012-09-29 | Discharge: 2012-09-29 | Disposition: A | Discharge status: Home / Self Care | Payer: Medicaid Other | Attending: Emergency Medicine | Admitting: Emergency Medicine

## 2012-09-29 ENCOUNTER — Encounter (HOSPITAL_COMMUNITY): Payer: Self-pay | Admitting: Emergency Medicine

## 2012-09-29 DIAGNOSIS — Z8719 Personal history of other diseases of the digestive system: Secondary | ICD-10-CM | POA: Insufficient documentation

## 2012-09-29 DIAGNOSIS — F329 Major depressive disorder, single episode, unspecified: Secondary | ICD-10-CM | POA: Insufficient documentation

## 2012-09-29 DIAGNOSIS — Z8742 Personal history of other diseases of the female genital tract: Secondary | ICD-10-CM | POA: Insufficient documentation

## 2012-09-29 DIAGNOSIS — E669 Obesity, unspecified: Secondary | ICD-10-CM | POA: Insufficient documentation

## 2012-09-29 DIAGNOSIS — Z79899 Other long term (current) drug therapy: Secondary | ICD-10-CM | POA: Insufficient documentation

## 2012-09-29 DIAGNOSIS — Z8619 Personal history of other infectious and parasitic diseases: Secondary | ICD-10-CM | POA: Insufficient documentation

## 2012-09-29 DIAGNOSIS — Z8669 Personal history of other diseases of the nervous system and sense organs: Secondary | ICD-10-CM | POA: Insufficient documentation

## 2012-09-29 DIAGNOSIS — F17200 Nicotine dependence, unspecified, uncomplicated: Secondary | ICD-10-CM | POA: Insufficient documentation

## 2012-09-29 DIAGNOSIS — Z87440 Personal history of urinary (tract) infections: Secondary | ICD-10-CM | POA: Insufficient documentation

## 2012-09-29 DIAGNOSIS — K8020 Calculus of gallbladder without cholecystitis without obstruction: Secondary | ICD-10-CM | POA: Insufficient documentation

## 2012-09-29 DIAGNOSIS — F411 Generalized anxiety disorder: Secondary | ICD-10-CM | POA: Insufficient documentation

## 2012-09-29 DIAGNOSIS — Z8709 Personal history of other diseases of the respiratory system: Secondary | ICD-10-CM | POA: Insufficient documentation

## 2012-09-29 DIAGNOSIS — F3289 Other specified depressive episodes: Secondary | ICD-10-CM | POA: Insufficient documentation

## 2012-09-29 DIAGNOSIS — I10 Essential (primary) hypertension: Secondary | ICD-10-CM | POA: Insufficient documentation

## 2012-09-29 DIAGNOSIS — R110 Nausea: Secondary | ICD-10-CM | POA: Insufficient documentation

## 2012-09-29 LAB — CBC WITH DIFFERENTIAL/PLATELET
Basophils Absolute: 0 10*3/uL (ref 0.0–0.1)
Basophils Relative: 0 % (ref 0–1)
Eosinophils Absolute: 0.3 10*3/uL (ref 0.0–0.7)
Eosinophils Relative: 2 % (ref 0–5)
HCT: 36.1 % (ref 36.0–46.0)
Hemoglobin: 12.4 g/dL (ref 12.0–15.0)
Lymphocytes Relative: 29 % (ref 12–46)
Lymphs Abs: 3.8 10*3/uL (ref 0.7–4.0)
MCH: 27.4 pg (ref 26.0–34.0)
MCHC: 34.3 g/dL (ref 30.0–36.0)
MCV: 79.7 fL (ref 78.0–100.0)
Monocytes Absolute: 0.6 10*3/uL (ref 0.1–1.0)
Monocytes Relative: 4 % (ref 3–12)
Neutro Abs: 8.4 10*3/uL — ABNORMAL HIGH (ref 1.7–7.7)
Neutrophils Relative %: 64 % (ref 43–77)
Platelets: 311 10*3/uL (ref 150–400)
RBC: 4.53 MIL/uL (ref 3.87–5.11)
RDW: 14.2 % (ref 11.5–15.5)
WBC: 13.1 10*3/uL — ABNORMAL HIGH (ref 4.0–10.5)

## 2012-09-29 LAB — COMPREHENSIVE METABOLIC PANEL
ALT: 12 U/L (ref 0–35)
AST: 14 U/L (ref 0–37)
Albumin: 3.3 g/dL — ABNORMAL LOW (ref 3.5–5.2)
Alkaline Phosphatase: 57 U/L (ref 39–117)
BUN: 7 mg/dL (ref 6–23)
CO2: 26 mEq/L (ref 19–32)
Calcium: 9 mg/dL (ref 8.4–10.5)
Chloride: 104 mEq/L (ref 96–112)
Creatinine, Ser: 0.63 mg/dL (ref 0.50–1.10)
GFR calc Af Amer: >90 mL/min (ref >90)
GFR calc non Af Amer: >90 mL/min (ref >90)
Glucose, Bld: 117 mg/dL — ABNORMAL HIGH (ref 70–99)
Potassium: 3.2 mEq/L — ABNORMAL LOW (ref 3.5–5.1)
Sodium: 138 mEq/L (ref 135–145)
Total Bilirubin: 0.2 mg/dL — ABNORMAL LOW (ref 0.3–1.2)
Total Protein: 7.3 g/dL (ref 6.0–8.3)

## 2012-09-29 LAB — URINALYSIS, MICROSCOPIC ONLY
Bilirubin Urine: NEGATIVE
Glucose, UA: NEGATIVE mg/dL
Ketones, ur: NEGATIVE mg/dL
Leukocytes, UA: NEGATIVE
Nitrite: NEGATIVE
Protein, ur: NEGATIVE mg/dL
Specific Gravity, Urine: 1.024 (ref 1.005–1.030)
Urobilinogen, UA: 1 mg/dL (ref 0.0–1.0)
pH: 7 (ref 5.0–8.0)

## 2012-09-29 LAB — POCT PREGNANCY, URINE: Preg Test, Ur: NEGATIVE

## 2012-09-29 LAB — LIPASE, BLOOD: Lipase: 24 U/L (ref 11–59)

## 2012-09-29 MED ORDER — ONDANSETRON HCL 4 MG/2ML IJ SOLN
4.0000 mg | Freq: Once | INTRAMUSCULAR | Status: AC
  Administered 2012-09-29: 4 mg via INTRAVENOUS
  Filled 2012-09-29: qty 2

## 2012-09-29 MED ORDER — HYDROMORPHONE HCL PF 1 MG/ML IJ SOLN
1.0000 mg | Freq: Once | INTRAMUSCULAR | Status: AC
  Administered 2012-09-29: 1 mg via INTRAVENOUS
  Filled 2012-09-29: qty 1

## 2012-09-29 MED ORDER — SODIUM CHLORIDE 0.9 % IV BOLUS (SEPSIS): 500.0000 mL | Freq: Once | INTRAVENOUS | Status: DC

## 2012-09-29 MED ORDER — OXYCODONE-ACETAMINOPHEN 5-325 MG PO TABS: 1.0000 | ORAL_TABLET | Freq: Four times a day (QID) | ORAL | Status: DC | PRN

## 2012-09-29 MED ORDER — SODIUM CHLORIDE 0.9 % IV SOLN
INTRAVENOUS | Status: DC
  Administered 2012-09-29: 23:00:00 via INTRAVENOUS

## 2012-09-29 MED ORDER — OXYCODONE HCL 5 MG PO TABS: 5.0000 mg | ORAL_TABLET | Freq: Four times a day (QID) | ORAL | Status: DC | PRN

## 2012-09-29 NOTE — ED Provider Notes (Signed)
History     CSN: [id number]  Arrival date & time 09/29/12  1718   First MD Initiated Contact with Patient 09/29/12 2101      Chief Complaint  Patient presents with  . Abdominal Pain    (Consider location/radiation/quality/duration/timing/severity/associated sxs/prior treatment) Patient is a 27 y.o. female presenting with abdominal pain.  Abdominal Pain Associated symptoms: nausea   Associated symptoms: no chest pain, no dysuria, no fever, no shortness of breath and no vomiting    Patient with known gallstones that was diagnosed on April 13. Patient also seen April 19 for recurrent pain. This time patient started with epigastric right upper quadrant pain yesterday at 8 in the morning. Pains been intermittent associated with nausea but no vomiting. Patient has a surgery appointment April 28 with [facility]. Patient has return for recurrent abdominal pain which is 8-10 out of 10 nonradiating described as an ache and sharp at times. Not made worse by anything or better by anything. Patient is having trouble with recurrent right upper quadrant abdominal pain since the diagnosis on the 13th. But as stated above this episode just started yesterday at 8 in the morning.  Past Medical History  Diagnosis Date  . Sleep apnea   . Bronchitis   . Hypertension   . Acid reflux   . Pregnancy induced hypertension   . Urinary tract infection   . History of PID   . Abnormal Pap smear     colposcopy, mild dysplasia, HPV  . Gonorrhea   . Chlamydia   . Trichomonas   . Depression     on meds for bipolar  . Anxiety     on meds- stable  . Obesity     Past Surgical History  Procedure Laterality Date  . Cesarean section  x3  . Wisdom tooth extraction    . Foot surgery    . Tubal ligation      Family History  Problem Relation Age of Onset  . Other Neg Hx     History  Substance Use Topics  . Smoking status: Current Every Day Smoker -- 0.25 packs/day for 1 years    Types:  Cigarettes  . Smokeless tobacco: Never Used  . Alcohol Use: No     Comment: rarely    OB History   Grav Para Term Preterm Abortions TAB SAB Ect Mult Living   3 3 3       3       Review of Systems  Constitutional: Negative for fever.  HENT: Negative for congestion.   Eyes: Negative for redness.  Respiratory: Negative for shortness of breath.   Cardiovascular: Negative for chest pain.  Gastrointestinal: Positive for nausea and abdominal pain. Negative for vomiting.  Genitourinary: Negative for dysuria.  Musculoskeletal: Negative for back pain.  Skin: Negative for rash.  Neurological: Negative for headaches.  Hematological: Does not bruise/bleed easily.  Psychiatric/Behavioral: Negative for confusion.    Allergies  Nsaids  Home Medications   Current Outpatient Rx  Name  Route  Sig  Dispense  Refill  . diphenhydrAMINE (BENADRYL) 25 MG tablet   Oral   Take 25 mg by mouth every 6 (six) hours as needed for itching or allergies.         . famotidine (PEPCID) 20 MG tablet   Oral   Take 1 tablet (20 mg total) by mouth 2 (two) times daily.   10 tablet   0   . Lurasidone HCl (LATUDA) 20 MG TABS  Oral   Take 20 mg by mouth at bedtime.         Marland Kitchen oxyCODONE (OXY IR/ROXICODONE) 5 MG immediate release tablet   Oral   Take 1 tablet (5 mg total) by mouth every 4 (four) hours as needed for pain. For pain   20 tablet   0   . predniSONE (DELTASONE) 20 MG tablet   Oral   Take 40 mg by mouth daily.          . sertraline (ZOLOFT) 50 MG tablet   Oral   Take 50 mg by mouth every morning.         Marland Kitchen oxyCODONE-acetaminophen (PERCOCET/ROXICET) 5-325 MG per tablet   Oral   Take 1 tablet by mouth every 6 (six) hours as needed for pain.   20 tablet   0     BP 140/83  Pulse 82  Temp(Src) 99.1 F (37.3 C) (Oral)  Resp 20  SpO2 100%  LMP 09/09/2012  Physical Exam  Nursing note and vitals reviewed. Constitutional: She is oriented to person, place, and time. She  appears well-developed and well-nourished. No distress.  HENT:  Head: Normocephalic and atraumatic.  Mouth/Throat: Oropharynx is clear and moist.  Eyes: Conjunctivae and EOM are normal. Pupils are equal, round, and reactive to light.  Neck: Normal range of motion.  Cardiovascular: Normal rate, regular rhythm and normal heart sounds.   No murmur heard. Pulmonary/Chest: Effort normal and breath sounds normal.  Abdominal: Soft. Bowel sounds are normal. There is tenderness. There is no rebound.  Mild to moderate tenderness epigastric right upper quadrant.  Musculoskeletal: Normal range of motion.  Neurological: She is alert and oriented to person, place, and time. No cranial nerve deficit. She exhibits normal muscle tone. Coordination normal.  Skin: Skin is warm. No rash noted.    ED Course  Procedures (including critical care time)  Labs Reviewed  CBC WITH DIFFERENTIAL - Abnormal; Notable for the following:    WBC 13.1 (*)    Neutro Abs 8.4 (*)    All other components within normal limits  COMPREHENSIVE METABOLIC PANEL - Abnormal; Notable for the following:    Potassium 3.2 (*)    Glucose, Bld 117 (*)    Albumin 3.3 (*)    Total Bilirubin 0.2 (*)    All other components within normal limits  URINALYSIS, MICROSCOPIC ONLY - Abnormal; Notable for the following:    APPearance CLOUDY (*)    Hgb urine dipstick SMALL (*)    Bacteria, UA FEW (*)    Squamous Epithelial / LPF FEW (*)    All other components within normal limits  LIPASE, BLOOD  POCT PREGNANCY, URINE   US Abdomen Complete  09/29/2012  *RADIOLOGY REPORT*  Clinical Data:  Abdominal pain  COMPLETE ABDOMINAL ULTRASOUND  Comparison:  09/19/2012  Findings:  Gallbladder:  Gallstones.  Contracted gallbladder, with mild wall thickness at 4 mm.  No pericholecystic fluid.  Negative Murphy's sign.  Common bile duct:  Upper normal to mildly prominent at 5 mm proximally.  The mid and distal duct are obscured by poor acoustic windows/bowel  gas artifact.  Note that this caliber is unchanged from the prior.  Liver:  Heterogeneity may reflect areas of steatosis.  Focal lesion detection is limited in this setting.  IVC:  Appears normal.  Pancreas:  No focal abnormality within the head/neck.  Portions of the body/tail obscured by bowel gas artifact/limited acoustic windows.  Spleen:  Normal sonographic appearance, measuring 8 cm.  Right  Kidney:  Normal sonographic appearance, measuring 12 cm.  No hydronephrosis.  Left Kidney:  Suboptimal visualization due to limited acoustic windows.  No overt abnormality.  No hydronephrosis.  Measures approximately 11 cm.  Abdominal aorta:  No aneurysm identified.  IMPRESSION: Cholelithiasis.  Contracted gallbladder likely accounts for mild wall thickening.  Negative Murphy's sign.  CBD upper normal, unchanged.  Correlate with LFTs.  Hepatic steatosis.   Original Report Authenticated By: Jearld Lesch, M.D.    Results for orders placed during the hospital encounter of 09/29/12  CBC WITH DIFFERENTIAL      Result Value Range   WBC 13.1 (*) 4.0 - 10.5 K/uL   RBC 4.53  3.87 - 5.11 MIL/uL   Hemoglobin 12.4  12.0 - 15.0 g/dL   HCT 16.1  09.6 - 04.5 %   MCV 79.7  78.0 - 100.0 fL   MCH 27.4  26.0 - 34.0 pg   MCHC 34.3  30.0 - 36.0 g/dL   RDW 40.9  81.1 - 91.4 %   Platelets 311  150 - 400 K/uL   Neutrophils Relative 64  43 - 77 %   Neutro Abs 8.4 (*) 1.7 - 7.7 K/uL   Lymphocytes Relative 29  12 - 46 %   Lymphs Abs 3.8  0.7 - 4.0 K/uL   Monocytes Relative 4  3 - 12 %   Monocytes Absolute 0.6  0.1 - 1.0 K/uL   Eosinophils Relative 2  0 - 5 %   Eosinophils Absolute 0.3  0.0 - 0.7 K/uL   Basophils Relative 0  0 - 1 %   Basophils Absolute 0.0  0.0 - 0.1 K/uL  COMPREHENSIVE METABOLIC PANEL      Result Value Range   Sodium 138  135 - 145 mEq/L   Potassium 3.2 (*) 3.5 - 5.1 mEq/L   Chloride 104  96 - 112 mEq/L   CO2 26  19 - 32 mEq/L   Glucose, Bld 117 (*) 70 - 99 mg/dL   BUN 7  6 - 23 mg/dL    Creatinine, Ser 7.82  0.50 - 1.10 mg/dL   Calcium 9.0  8.4 - 95.6 mg/dL   Total Protein 7.3  6.0 - 8.3 g/dL   Albumin 3.3 (*) 3.5 - 5.2 g/dL   AST 14  0 - 37 U/L   ALT 12  0 - 35 U/L   Alkaline Phosphatase 57  39 - 117 U/L   Total Bilirubin 0.2 (*) 0.3 - 1.2 mg/dL   GFR calc non Af Amer >90  >90 mL/min   GFR calc Af Amer >90  >90 mL/min  LIPASE, BLOOD      Result Value Range   Lipase 24  11 - 59 U/L  URINALYSIS, MICROSCOPIC ONLY      Result Value Range   Color, Urine YELLOW  YELLOW   APPearance CLOUDY (*) CLEAR   Specific Gravity, Urine 1.024  1.005 - 1.030   pH 7.0  5.0 - 8.0   Glucose, UA NEGATIVE  NEGATIVE mg/dL   Hgb urine dipstick SMALL (*) NEGATIVE   Bilirubin Urine NEGATIVE  NEGATIVE   Ketones, ur NEGATIVE  NEGATIVE mg/dL   Protein, ur NEGATIVE  NEGATIVE mg/dL   Urobilinogen, UA 1.0  0.0 - 1.0 mg/dL   Nitrite NEGATIVE  NEGATIVE   Leukocytes, UA NEGATIVE  NEGATIVE   WBC, UA 0-2  <3 WBC/hpf   RBC / HPF 3-6  <3 RBC/hpf   Bacteria, UA FEW (*) RARE  Squamous Epithelial / LPF FEW (*) RARE   Urine-Other MUCOUS PRESENT    POCT PREGNANCY, URINE      Result Value Range   Preg Test, Ur NEGATIVE  NEGATIVE     1. Cholelithiasis       MDM   Ultrasound without evidence of acute cholecystitis. The patient's liver function test are normal no evidence of pancreatitis. There is a mild leukocytosis but she's had an elevated white blood cell count since the 13th. Usually been somewhere in the realm of 12-13. Patient currently is nontender in the right upper quadrant. No evidence of acute cholecystitis as stated today biliary colic has resolved. Patient has followup with [facility] on April 28.pre Cautions provided patient will be sent home with some pain medication and will return for anything that is new or worse.         Shelda Jakes, MD 09/29/12 [phone]

## 2012-09-29 NOTE — ED Provider Notes (Signed)
Medical screening examination/treatment/procedure(s) were performed by non-physician practitioner and as supervising physician I was immediately available for consultation/collaboration.    Nahom Carfagno III, MD 09/29/12 1422 

## 2012-09-29 NOTE — ED Notes (Signed)
Pt here for abd pain that feels same and sts needs to have gall bladder removed

## 2012-09-29 NOTE — ED Notes (Signed)
Pt unable to give urine sample

## 2012-10-02 ENCOUNTER — Encounter (HOSPITAL_COMMUNITY): Payer: Self-pay | Admitting: Physical Medicine and Rehabilitation

## 2012-10-02 ENCOUNTER — Emergency Department (HOSPITAL_COMMUNITY)
Admission: EM | Admit: 2012-10-02 | Discharge: 2012-10-03 | Disposition: A | Discharge status: Home / Self Care | Payer: Medicaid Other | Attending: Emergency Medicine | Admitting: Emergency Medicine

## 2012-10-02 DIAGNOSIS — E669 Obesity, unspecified: Secondary | ICD-10-CM | POA: Insufficient documentation

## 2012-10-02 DIAGNOSIS — Z8719 Personal history of other diseases of the digestive system: Secondary | ICD-10-CM | POA: Insufficient documentation

## 2012-10-02 DIAGNOSIS — F319 Bipolar disorder, unspecified: Secondary | ICD-10-CM | POA: Insufficient documentation

## 2012-10-02 DIAGNOSIS — F411 Generalized anxiety disorder: Secondary | ICD-10-CM | POA: Insufficient documentation

## 2012-10-02 DIAGNOSIS — K8050 Calculus of bile duct without cholangitis or cholecystitis without obstruction: Secondary | ICD-10-CM

## 2012-10-02 DIAGNOSIS — Z87440 Personal history of urinary (tract) infections: Secondary | ICD-10-CM | POA: Insufficient documentation

## 2012-10-02 DIAGNOSIS — R110 Nausea: Secondary | ICD-10-CM | POA: Insufficient documentation

## 2012-10-02 DIAGNOSIS — I10 Essential (primary) hypertension: Secondary | ICD-10-CM | POA: Insufficient documentation

## 2012-10-02 DIAGNOSIS — K8020 Calculus of gallbladder without cholecystitis without obstruction: Secondary | ICD-10-CM | POA: Insufficient documentation

## 2012-10-02 DIAGNOSIS — Z8619 Personal history of other infectious and parasitic diseases: Secondary | ICD-10-CM | POA: Insufficient documentation

## 2012-10-02 DIAGNOSIS — Z79899 Other long term (current) drug therapy: Secondary | ICD-10-CM | POA: Insufficient documentation

## 2012-10-02 DIAGNOSIS — F17200 Nicotine dependence, unspecified, uncomplicated: Secondary | ICD-10-CM | POA: Insufficient documentation

## 2012-10-02 DIAGNOSIS — Z8709 Personal history of other diseases of the respiratory system: Secondary | ICD-10-CM | POA: Insufficient documentation

## 2012-10-02 LAB — LIPASE, BLOOD: Lipase: 25 U/L (ref 11–59)

## 2012-10-02 LAB — COMPREHENSIVE METABOLIC PANEL
ALT: 12 U/L (ref 0–35)
AST: 14 U/L (ref 0–37)
Albumin: 3.3 g/dL — ABNORMAL LOW (ref 3.5–5.2)
Alkaline Phosphatase: 58 U/L (ref 39–117)
BUN: 9 mg/dL (ref 6–23)
CO2: 23 mEq/L (ref 19–32)
Calcium: 9.3 mg/dL (ref 8.4–10.5)
Chloride: 104 mEq/L (ref 96–112)
Creatinine, Ser: 0.63 mg/dL (ref 0.50–1.10)
GFR calc Af Amer: >90 mL/min (ref >90)
GFR calc non Af Amer: >90 mL/min (ref >90)
Glucose, Bld: 103 mg/dL — ABNORMAL HIGH (ref 70–99)
Potassium: 3.5 mEq/L (ref 3.5–5.1)
Sodium: 138 mEq/L (ref 135–145)
Total Bilirubin: 0.2 mg/dL — ABNORMAL LOW (ref 0.3–1.2)
Total Protein: 7.4 g/dL (ref 6.0–8.3)

## 2012-10-02 LAB — CBC WITH DIFFERENTIAL/PLATELET
Basophils Absolute: 0 10*3/uL (ref 0.0–0.1)
Basophils Relative: 0 % (ref 0–1)
Eosinophils Absolute: 0.3 10*3/uL (ref 0.0–0.7)
Eosinophils Relative: 2 % (ref 0–5)
HCT: 36.9 % (ref 36.0–46.0)
Hemoglobin: 13 g/dL (ref 12.0–15.0)
Lymphocytes Relative: 23 % (ref 12–46)
Lymphs Abs: 3.4 10*3/uL (ref 0.7–4.0)
MCH: 28.1 pg (ref 26.0–34.0)
MCHC: 35.2 g/dL (ref 30.0–36.0)
MCV: 79.7 fL (ref 78.0–100.0)
Monocytes Absolute: 0.5 10*3/uL (ref 0.1–1.0)
Monocytes Relative: 4 % (ref 3–12)
Neutro Abs: 10.6 10*3/uL — ABNORMAL HIGH (ref 1.7–7.7)
Neutrophils Relative %: 71 % (ref 43–77)
Platelets: 365 10*3/uL (ref 150–400)
RBC: 4.63 MIL/uL (ref 3.87–5.11)
RDW: 14.2 % (ref 11.5–15.5)
WBC: 14.9 10*3/uL — ABNORMAL HIGH (ref 4.0–10.5)

## 2012-10-02 MED ORDER — HYDROMORPHONE HCL PF 1 MG/ML IJ SOLN
1.0000 mg | INTRAMUSCULAR | Status: AC
  Administered 2012-10-02: 1 mg via INTRAVENOUS
  Filled 2012-10-02: qty 1

## 2012-10-02 MED ORDER — LACTATED RINGERS IV BOLUS (SEPSIS)
1000.0000 mL | Freq: Once | INTRAVENOUS | Status: AC
  Administered 2012-10-02: 1000 mL via INTRAVENOUS

## 2012-10-02 MED ORDER — ONDANSETRON HCL 4 MG/2ML IJ SOLN
4.0000 mg | Freq: Once | INTRAMUSCULAR | Status: AC
  Administered 2012-10-02: 4 mg via INTRAVENOUS
  Filled 2012-10-02: qty 2

## 2012-10-02 NOTE — ED Provider Notes (Signed)
History     CSN: [id number]  Arrival date & time 10/02/12  1836   First MD Initiated Contact with Patient 10/02/12 2116      Chief Complaint  Patient presents with  . Abdominal Pain   HPI Christl Fessenden is a 27 y.o. female is been diagnosed with symptomatic cholelithiasis presents with right upper quadrant pain. Patient says she is out of her pain medicine and she was prescribed and has had worsening right upper quadrant pain throughout the day this is worse after she eats, it is sharp, burning, 8/10, radiates somewhat to left back, also is burning somewhat in the epigastrium. She's had some nausea without associated vomiting. No diarrhea, no fever, chills, shortness of breath, chest pain. Patient has not filled her Zofran prescription she was given in the emergency department. Patient has preop labs scheduled on Monday - she'll also see her primary care physician on Monday.  Past Medical History  Diagnosis Date  . Sleep apnea   . Bronchitis   . Hypertension   . Acid reflux   . Pregnancy induced hypertension   . Urinary tract infection   . History of PID   . Abnormal Pap smear     colposcopy, mild dysplasia, HPV  . Gonorrhea   . Chlamydia   . Trichomonas   . Depression     on meds for bipolar  . Anxiety     on meds- stable  . Obesity     Past Surgical History  Procedure Laterality Date  . Cesarean section  x3  . Wisdom tooth extraction    . Foot surgery    . Tubal ligation      Family History  Problem Relation Age of Onset  . Other Neg Hx     History  Substance Use Topics  . Smoking status: Current Every Day Smoker -- 0.25 packs/day for 1 years    Types: Cigarettes  . Smokeless tobacco: Never Used  . Alcohol Use: No     Comment: rarely    OB History   Grav Para Term Preterm Abortions TAB SAB Ect Mult Living   3 3 3       3       Review of Systems At least 10pt or greater review of systems completed and are negative except where specified in the  HPI.  Allergies  Nsaids  Home Medications   Current Outpatient Rx  Name  Route  Sig  Dispense  Refill  . diphenhydrAMINE (BENADRYL) 25 MG tablet   Oral   Take 25 mg by mouth every 6 (six) hours as needed for itching or allergies.         . famotidine (PEPCID) 20 MG tablet   Oral   Take 1 tablet (20 mg total) by mouth 2 (two) times daily.   10 tablet   0   . Lurasidone HCl (LATUDA) 20 MG TABS   Oral   Take 20 mg by mouth at bedtime.         Marland Kitchen oxyCODONE (OXY IR/ROXICODONE) 5 MG immediate release tablet   Oral   Take 1 tablet (5 mg total) by mouth every 4 (four) hours as needed for pain. For pain   20 tablet   0   . sertraline (ZOLOFT) 50 MG tablet   Oral   Take 50 mg by mouth every morning.           BP 146/92  Pulse 109  Temp(Src) 98.4 F (36.9 C) (Oral)  Resp 20  SpO2 98%  LMP 09/09/2012  Physical Exam  Nursing notes reviewed.  Electronic medical record reviewed. VITAL SIGNS:   Filed Vitals:   10/02/12 1848  BP: 146/92  Pulse: 109  Temp: 98.4 F (36.9 C)  TempSrc: Oral  Resp: 20  SpO2: 98%   CONSTITUTIONAL: Awake, oriented, appears non-toxic HENT: Atraumatic, normocephalic, oral mucosa pink and moist, airway patent. Nares patent without drainage. External ears normal. EYES: Conjunctiva clear, EOMI, PERRLA NECK: Trachea midline, non-tender, supple CARDIOVASCULAR: Normal heart rate, Normal rhythm, No murmurs, rubs, gallops PULMONARY/CHEST: Clear to auscultation, no rhonchi, wheezes, or rales. Symmetrical breath sounds. Non-tender. ABDOMINAL: Non-distended, soft, morbidly obese, tenderness to palpation in the right upper quadrant and epigastrium without rebound or guarding.  BS normal. NEUROLOGIC: Non-focal, moving all four extremities, no gross sensory or motor deficits. EXTREMITIES: No clubbing, cyanosis, or edema SKIN: Warm, Dry, No erythema, No rash  ED Course  Procedures (including critical care time)  Labs Reviewed  CBC WITH  DIFFERENTIAL - Abnormal; Notable for the following:    WBC 14.9 (*)    Neutro Abs 10.6 (*)    All other components within normal limits  COMPREHENSIVE METABOLIC PANEL - Abnormal; Notable for the following:    Glucose, Bld 103 (*)    Albumin 3.3 (*)    Total Bilirubin 0.2 (*)    All other components within normal limits  LIPASE, BLOOD   No results found.   1. Biliary colic       MDM  We'll obtain some basic labs, control patient's pain and nausea, explain any diet to the patient that will not exacerbate her cholelithiasis and give her some pain medicine until she can get to see her primary care physician on Monday. I will prescribe her Roxicodone, as she is evidently allergic to NSAIDs and acetaminophen.  Bedside ultrasound shows: 2 mm gallbladder wall, very large stone is seen within the gallbladder, duct looks patent. No Murphy sign, no sonographic Murphy sign.  She is instructed on diet, proper medication compliance, the fact that she will have some level of pain secondary to this gallbladder being in.  White count is 14.9 tonight, it's been elevated, don't think this is a significant elevation over what it's been I. Do not have any evidence for acute cholecystitis at this time otherwise with my bedside ultrasound and normal LFTs.  I explained the diagnosis and have given explicit precautions to return to the ER including High fevers, severe pain, intractable vomiting or any other new or worsening symptoms. The patient understands and accepts the medical plan as it's been dictated and I have answered their questions. Discharge instructions concerning home care and prescriptions have been given.  The patient is STABLE and is discharged to home in good condition.           Jones Skene, MD 10/03/12 0040

## 2012-10-02 NOTE — ED Notes (Signed)
Pt presents to department for evaluation of diffuse abdominal pain and nausea. 7/10 pain at the time. Onset Friday night. History of gallstones. Pt is alert and oriented x4.

## 2012-10-03 MED ORDER — OXYCODONE HCL 5 MG PO TABS: 5.0000 mg | ORAL_TABLET | Freq: Four times a day (QID) | ORAL | Status: DC | PRN

## 2012-10-04 ENCOUNTER — Encounter (INDEPENDENT_AMBULATORY_CARE_PROVIDER_SITE_OTHER): Payer: Self-pay | Admitting: Surgery

## 2012-10-04 ENCOUNTER — Encounter (HOSPITAL_COMMUNITY): Payer: Self-pay | Admitting: *Deleted

## 2012-10-04 ENCOUNTER — Ambulatory Visit (INDEPENDENT_AMBULATORY_CARE_PROVIDER_SITE_OTHER): Payer: Medicaid Other | Admitting: Surgery

## 2012-10-04 VITALS — BP 132/90 | HR 78 | Temp 97.2°F | Ht 65.0 in | Wt 301.6 lb

## 2012-10-04 DIAGNOSIS — K8010 Calculus of gallbladder with chronic cholecystitis without obstruction: Secondary | ICD-10-CM | POA: Insufficient documentation

## 2012-10-04 NOTE — Progress Notes (Signed)
Chest x ray, EKG  9/13 EPIC

## 2012-10-04 NOTE — Progress Notes (Signed)
CMP 10/02/12 EPIC.  Instructed patient may have clear liquids- no milk products until 0600 Thursday MORNING-  Then nothing by mouth

## 2012-10-04 NOTE — Progress Notes (Signed)
Patient ID: [name], female   DOB: 06/04/1986, 27 y.o.   MRN: [id number]  Chief Complaint  Patient presents with  . New Evaluation    eval chole    HPI [name] is a 27 y.o. female.  Referred by Dr. Bland for evaluation of gallbladder disease  HPI This is a 27-year-old female with morbid obesity who presents with several months of intermittent upper abdominal pain. This is associated with nausea, vomiting, and abdominal bloating. She denies any diarrhea. Initially this was associated with eating greasy or spicy foods. Recently the pain has become more persistent and tends to occur whenever she eats any type of food. She was diagnosed with gallstones but no sign of cholecystitis based on an ultrasound performed in the emergency department. Liver function tests were normal. She presents now for surgical evaluation for cholecystectomy. Past Medical History  Diagnosis Date  . Sleep apnea   . Bronchitis   . Hypertension   . Acid reflux   . Pregnancy induced hypertension   . Urinary tract infection   . History of PID   . Abnormal Pap smear     colposcopy, mild dysplasia, HPV  . Gonorrhea   . Chlamydia   . Trichomonas   . Depression     on meds for bipolar  . Anxiety     on meds- stable  . Obesity     Past Surgical History  Procedure Laterality Date  . Wisdom tooth extraction    . Foot surgery    . Tubal ligation    . Cesarean section  05/02/03,05/04/08,10/23/10    x3    Family History  Problem Relation Age of Onset  . Other Neg Hx   . Hypertension Mother   . Heart disease Father   . Stroke Father     Social History History  Substance Use Topics  . Smoking status: Current Every Day Smoker -- 0.25 packs/day for 1 years    Types: Cigarettes  . Smokeless tobacco: Never Used  . Alcohol Use: Yes     Comment: socially    Allergies  Allergen Reactions  . Nsaids Hives, Itching, Rash and Other (See Comments)    TYLENOL ALSO    Current Outpatient  Prescriptions  Medication Sig Dispense Refill  . Lurasidone HCl (LATUDA) 20 MG TABS Take 20 mg by mouth at bedtime.      . oxyCODONE (OXY IR/ROXICODONE) 5 MG immediate release tablet Take 1-2 tablets (5-10 mg total) by mouth every 6 (six) hours as needed (Moderate to severe pain). For pain  23 tablet  0  . sertraline (ZOLOFT) 50 MG tablet Take 50 mg by mouth every morning.      . diphenhydrAMINE (BENADRYL) 25 MG tablet Take 25 mg by mouth every 6 (six) hours as needed for itching or allergies.      . famotidine (PEPCID) 20 MG tablet Take 1 tablet (20 mg total) by mouth 2 (two) times daily.  10 tablet  0   No current facility-administered medications for this visit.    Review of Systems Review of Systems  Constitutional: Negative for fever, chills and unexpected weight change.  HENT: Negative for hearing loss, congestion, sore throat, trouble swallowing and voice change.   Eyes: Negative for visual disturbance.  Respiratory: Negative for cough and wheezing.   Cardiovascular: Negative for chest pain, palpitations and leg swelling.  Gastrointestinal: Positive for nausea, vomiting, abdominal pain and abdominal distention. Negative for diarrhea, constipation, blood in stool and anal bleeding.    Genitourinary: Negative for hematuria, vaginal bleeding and difficulty urinating.  Musculoskeletal: Negative for arthralgias.  Skin: Negative for rash and wound.  Neurological: Negative for seizures, syncope and headaches.  Hematological: Negative for adenopathy. Does not bruise/bleed easily.  Psychiatric/Behavioral: Negative for confusion.    Blood pressure 132/90, pulse 78, temperature 97.2 F (36.2 C), temperature source Temporal, height 5' 5" (1.651 m), weight 301 lb 9.6 oz (136.805 kg), last menstrual period 09/09/2012.  Physical Exam Physical Exam WDWN in NAD HEENT:  EOMI, sclera anicteric Neck:  No masses, no thyromegaly Lungs:  CTA bilaterally; normal respiratory effort CV:  Regular rate  and rhythm; no murmurs Abd:  +bowel sounds, obese, tender in epigastrium and RUQ Ext:  Well-perfused; no edema Skin:  Warm, dry; no sign of jaundice  Data Reviewed Lab Results  Component Value Date   WBC 14.9* 10/02/2012   HGB 13.0 10/02/2012   HCT 36.9 10/02/2012   MCV 79.7 10/02/2012   PLT 365 10/02/2012   Lab Results  Component Value Date   CREATININE 0.63 10/02/2012   BUN 9 10/02/2012   NA 138 10/02/2012   K 3.5 10/02/2012   CL 104 10/02/2012   CO2 23 10/02/2012   Lab Results  Component Value Date   ALT 12 10/02/2012   AST 14 10/02/2012   ALKPHOS 58 10/02/2012   BILITOT 0.2* 10/02/2012   RADIOLOGY REPORT*  Clinical Data: Abdominal pain  COMPLETE ABDOMINAL ULTRASOUND  Comparison: 09/19/2012  Findings:  Gallbladder: Gallstones. Contracted gallbladder, with mild wall  thickness at 4 mm. No pericholecystic fluid. Negative Murphy's  sign.  Common bile duct: Upper normal to mildly prominent at 5 mm  proximally. The mid and distal duct are obscured by poor acoustic  windows/bowel gas artifact. Note that this caliber is unchanged  from the prior.  Liver: Heterogeneity may reflect areas of steatosis. Focal lesion  detection is limited in this setting.  IVC: Appears normal.  Pancreas: No focal abnormality within the head/neck. Portions of  the body/tail obscured by bowel gas artifact/limited acoustic  windows.  Spleen: Normal sonographic appearance, measuring 8 cm.  Right Kidney: Normal sonographic appearance, measuring 12 cm. No  hydronephrosis.  Left Kidney: Suboptimal visualization due to limited acoustic  windows. No overt abnormality. No hydronephrosis. Measures  approximately 11 cm.  Abdominal aorta: No aneurysm identified.  IMPRESSION:  Cholelithiasis. Contracted gallbladder likely accounts for mild  wall thickening. Negative Murphy's sign.  CBD upper normal, unchanged. Correlate with LFTs.  Hepatic steatosis.  Original Report Authenticated By: Andrew DelGaizo,  M.D.   Assessment    Chronic calculus cholecystitis with biliary colic     Plan    Laparoscopic cholecystectomy with intraoperative cholangiogram.  The surgical procedure has been discussed with the patient.  Potential risks, benefits, alternative treatments, and expected outcomes have been explained.  All of the patient's questions at this time have been answered.  The likelihood of reaching the patient's treatment goal is good.  The patient understand the proposed surgical procedure and wishes to proceed.         Ayrton Mcvay K. 10/04/2012, 10:06 AM    

## 2012-10-06 ENCOUNTER — Encounter (HOSPITAL_COMMUNITY): Payer: Self-pay | Admitting: Pharmacy Technician

## 2012-10-07 ENCOUNTER — Encounter (HOSPITAL_COMMUNITY): Payer: Self-pay

## 2012-10-07 ENCOUNTER — Encounter (HOSPITAL_COMMUNITY)
Admission: RE | Disposition: A | Discharge status: Home / Self Care | Payer: Self-pay | Source: Ambulatory Visit | Attending: Surgery

## 2012-10-07 ENCOUNTER — Encounter (HOSPITAL_COMMUNITY): Payer: Self-pay | Admitting: Anesthesiology

## 2012-10-07 ENCOUNTER — Ambulatory Visit (HOSPITAL_COMMUNITY): Payer: Medicaid Other

## 2012-10-07 ENCOUNTER — Ambulatory Visit (HOSPITAL_COMMUNITY): Payer: Medicaid Other | Admitting: Anesthesiology

## 2012-10-07 ENCOUNTER — Ambulatory Visit (HOSPITAL_COMMUNITY)
Admission: RE | Admit: 2012-10-07 | Discharge: 2012-10-07 | Disposition: A | Discharge status: Home / Self Care | Payer: Medicaid Other | Source: Ambulatory Visit | Attending: Surgery | Admitting: Surgery

## 2012-10-07 DIAGNOSIS — F3289 Other specified depressive episodes: Secondary | ICD-10-CM | POA: Insufficient documentation

## 2012-10-07 DIAGNOSIS — J40 Bronchitis, not specified as acute or chronic: Secondary | ICD-10-CM | POA: Insufficient documentation

## 2012-10-07 DIAGNOSIS — F411 Generalized anxiety disorder: Secondary | ICD-10-CM | POA: Insufficient documentation

## 2012-10-07 DIAGNOSIS — K8010 Calculus of gallbladder with chronic cholecystitis without obstruction: Secondary | ICD-10-CM

## 2012-10-07 DIAGNOSIS — I10 Essential (primary) hypertension: Secondary | ICD-10-CM | POA: Insufficient documentation

## 2012-10-07 DIAGNOSIS — Z6841 Body Mass Index (BMI) 40.0 and over, adult: Secondary | ICD-10-CM | POA: Insufficient documentation

## 2012-10-07 DIAGNOSIS — F329 Major depressive disorder, single episode, unspecified: Secondary | ICD-10-CM | POA: Insufficient documentation

## 2012-10-07 DIAGNOSIS — G4730 Sleep apnea, unspecified: Secondary | ICD-10-CM | POA: Insufficient documentation

## 2012-10-07 DIAGNOSIS — F17200 Nicotine dependence, unspecified, uncomplicated: Secondary | ICD-10-CM | POA: Insufficient documentation

## 2012-10-07 DIAGNOSIS — K219 Gastro-esophageal reflux disease without esophagitis: Secondary | ICD-10-CM | POA: Insufficient documentation

## 2012-10-07 HISTORY — PX: CHOLECYSTECTOMY: SHX55

## 2012-10-07 LAB — CBC
HCT: 33.3 % — ABNORMAL LOW (ref 36.0–46.0)
Hemoglobin: 10.9 g/dL — ABNORMAL LOW (ref 12.0–15.0)
MCH: 26.8 pg (ref 26.0–34.0)
MCHC: 32.7 g/dL (ref 30.0–36.0)
MCV: 81.8 fL (ref 78.0–100.0)
Platelets: 310 10*3/uL (ref 150–400)
RBC: 4.07 MIL/uL (ref 3.87–5.11)
RDW: 13.9 % (ref 11.5–15.5)
WBC: 12.2 10*3/uL — ABNORMAL HIGH (ref 4.0–10.5)

## 2012-10-07 LAB — NO BLOOD PRODUCTS

## 2012-10-07 LAB — SURGICAL PCR SCREEN
MRSA, PCR: NEGATIVE
Staphylococcus aureus: NEGATIVE

## 2012-10-07 LAB — HCG, SERUM, QUALITATIVE: Preg, Serum: NEGATIVE

## 2012-10-07 SURGERY — LAPAROSCOPIC CHOLECYSTECTOMY WITH INTRAOPERATIVE CHOLANGIOGRAM
Anesthesia: General | Site: Abdomen | Wound class: Clean Contaminated

## 2012-10-07 MED ORDER — LACTATED RINGERS IV SOLN
INTRAVENOUS | Status: DC | PRN
  Administered 2012-10-07 (×2): via INTRAVENOUS

## 2012-10-07 MED ORDER — HYDROMORPHONE HCL PF 1 MG/ML IJ SOLN
INTRAMUSCULAR | Status: AC
  Filled 2012-10-07: qty 2

## 2012-10-07 MED ORDER — FENTANYL CITRATE 0.05 MG/ML IJ SOLN
INTRAMUSCULAR | Status: DC | PRN
  Administered 2012-10-07: 100 ug via INTRAVENOUS
  Administered 2012-10-07 (×3): 50 ug via INTRAVENOUS

## 2012-10-07 MED ORDER — ROCURONIUM BROMIDE 100 MG/10ML IV SOLN
INTRAVENOUS | Status: DC | PRN
  Administered 2012-10-07: 30 mg via INTRAVENOUS
  Administered 2012-10-07: 10 mg via INTRAVENOUS

## 2012-10-07 MED ORDER — PROMETHAZINE HCL 25 MG/ML IJ SOLN: 6.2500 mg | INTRAMUSCULAR | Status: DC | PRN

## 2012-10-07 MED ORDER — HYDROMORPHONE HCL PF 1 MG/ML IJ SOLN
0.2500 mg | INTRAMUSCULAR | Status: DC | PRN
  Administered 2012-10-07 (×6): 0.5 mg via INTRAVENOUS

## 2012-10-07 MED ORDER — LIDOCAINE HCL (CARDIAC) 20 MG/ML IV SOLN
INTRAVENOUS | Status: DC | PRN
  Administered 2012-10-07: 100 mg via INTRAVENOUS

## 2012-10-07 MED ORDER — IOHEXOL 300 MG/ML  SOLN
INTRAMUSCULAR | Status: AC
  Filled 2012-10-07: qty 1

## 2012-10-07 MED ORDER — LABETALOL HCL 5 MG/ML IV SOLN
INTRAVENOUS | Status: DC | PRN
  Administered 2012-10-07 (×2): 5 mg via INTRAVENOUS

## 2012-10-07 MED ORDER — LACTATED RINGERS IV SOLN
INTRAVENOUS | Status: DC | PRN
  Administered 2012-10-07: 1000 mL via INTRAVENOUS

## 2012-10-07 MED ORDER — ONDANSETRON HCL 4 MG/2ML IJ SOLN
INTRAMUSCULAR | Status: DC | PRN
  Administered 2012-10-07: 4 mg via INTRAVENOUS

## 2012-10-07 MED ORDER — MUPIROCIN 2 % EX OINT
TOPICAL_OINTMENT | Freq: Two times a day (BID) | CUTANEOUS | Status: DC
  Administered 2012-10-07: 13:00:00 via NASAL
  Filled 2012-10-07: qty 22

## 2012-10-07 MED ORDER — HYDROMORPHONE HCL PF 1 MG/ML IJ SOLN: 1.0000 mg | INTRAMUSCULAR | Status: DC | PRN

## 2012-10-07 MED ORDER — DEXTROSE 5 % IV SOLN
3.0000 g | INTRAVENOUS | Status: AC
  Administered 2012-10-07: 3 g via INTRAVENOUS

## 2012-10-07 MED ORDER — CEFAZOLIN SODIUM-DEXTROSE 2-3 GM-% IV SOLR
INTRAVENOUS | Status: AC
  Filled 2012-10-07: qty 50

## 2012-10-07 MED ORDER — CEFAZOLIN SODIUM 1-5 GM-% IV SOLN
INTRAVENOUS | Status: AC
  Filled 2012-10-07: qty 50

## 2012-10-07 MED ORDER — MIDAZOLAM HCL 5 MG/5ML IJ SOLN
INTRAMUSCULAR | Status: DC | PRN
  Administered 2012-10-07: 2 mg via INTRAVENOUS

## 2012-10-07 MED ORDER — SUCCINYLCHOLINE CHLORIDE 20 MG/ML IJ SOLN
INTRAMUSCULAR | Status: DC | PRN
  Administered 2012-10-07: 140 mg via INTRAVENOUS

## 2012-10-07 MED ORDER — BUPIVACAINE-EPINEPHRINE 0.25% -1:200000 IJ SOLN
INTRAMUSCULAR | Status: AC
  Filled 2012-10-07: qty 1

## 2012-10-07 MED ORDER — BUPIVACAINE-EPINEPHRINE 0.25% -1:200000 IJ SOLN
INTRAMUSCULAR | Status: DC | PRN
  Administered 2012-10-07: 13 mL

## 2012-10-07 MED ORDER — OXYCODONE HCL 5 MG PO TABS: 5.0000 mg | ORAL_TABLET | ORAL | Status: DC | PRN

## 2012-10-07 MED ORDER — ONDANSETRON HCL 4 MG/2ML IJ SOLN
4.0000 mg | INTRAMUSCULAR | Status: DC | PRN
  Administered 2012-10-07: 4 mg via INTRAVENOUS

## 2012-10-07 MED ORDER — OXYCODONE HCL 5 MG PO TABS: 10.0000 mg | ORAL_TABLET | Freq: Four times a day (QID) | ORAL | Status: DC | PRN

## 2012-10-07 MED ORDER — HYDROMORPHONE HCL PF 1 MG/ML IJ SOLN
INTRAMUSCULAR | Status: AC
  Filled 2012-10-07: qty 1

## 2012-10-07 MED ORDER — OXYCODONE HCL 5 MG PO TABS: 5.0000 mg | ORAL_TABLET | Freq: Once | ORAL | Status: DC | PRN

## 2012-10-07 MED ORDER — GLYCOPYRROLATE 0.2 MG/ML IJ SOLN
INTRAMUSCULAR | Status: DC | PRN
  Administered 2012-10-07: 0.4 mg via INTRAVENOUS

## 2012-10-07 MED ORDER — SODIUM CHLORIDE 0.9 % IV SOLN
INTRAVENOUS | Status: DC | PRN
  Administered 2012-10-07: 16:00:00

## 2012-10-07 MED ORDER — LACTATED RINGERS IV SOLN: INTRAVENOUS | Status: DC

## 2012-10-07 MED ORDER — PROPOFOL 10 MG/ML IV BOLUS
INTRAVENOUS | Status: DC | PRN
  Administered 2012-10-07: 150 mg via INTRAVENOUS

## 2012-10-07 MED ORDER — OXYCODONE HCL 5 MG/5ML PO SOLN
5.0000 mg | Freq: Once | ORAL | Status: DC | PRN
  Filled 2012-10-07: qty 5

## 2012-10-07 MED ORDER — MEPERIDINE HCL 50 MG/ML IJ SOLN: 6.2500 mg | INTRAMUSCULAR | Status: DC | PRN

## 2012-10-07 MED ORDER — ONDANSETRON HCL 4 MG/2ML IJ SOLN
4.0000 mg | INTRAMUSCULAR | Status: DC | PRN
  Filled 2012-10-07: qty 2

## 2012-10-07 MED ORDER — NEOSTIGMINE METHYLSULFATE 1 MG/ML IJ SOLN
INTRAMUSCULAR | Status: DC | PRN
  Administered 2012-10-07: 3.5 mg via INTRAVENOUS

## 2012-10-07 SURGICAL SUPPLY — 43 items
APPLIER CLIP ROT 10 11.4 M/L (STAPLE) ×2
BENZOIN TINCTURE PRP APPL 2/3 (GAUZE/BANDAGES/DRESSINGS) ×2 IMPLANT
CANISTER SUCTION 2500CC (MISCELLANEOUS) ×2 IMPLANT
CANNULA ENDOPATH XCEL 11M (ENDOMECHANICALS) ×2 IMPLANT
CHLORAPREP W/TINT 26ML (MISCELLANEOUS) ×2 IMPLANT
CLIP APPLIE ROT 10 11.4 M/L (STAPLE) ×1 IMPLANT
CLOTH BEACON ORANGE TIMEOUT ST (SAFETY) ×2 IMPLANT
COVER MAYO STAND STRL (DRAPES) ×2 IMPLANT
DECANTER SPIKE VIAL GLASS SM (MISCELLANEOUS) ×2 IMPLANT
DRAPE C-ARM 42X72 X-RAY (DRAPES) ×2 IMPLANT
DRAPE LAPAROSCOPIC ABDOMINAL (DRAPES) ×2 IMPLANT
DRAPE UTILITY XL STRL (DRAPES) ×2 IMPLANT
DRSG TEGADERM 2-3/8X2-3/4 SM (GAUZE/BANDAGES/DRESSINGS) ×6 IMPLANT
DRSG TEGADERM 4X4.75 (GAUZE/BANDAGES/DRESSINGS) ×2 IMPLANT
ELECT REM PT RETURN 9FT ADLT (ELECTROSURGICAL) ×2
ELECTRODE REM PT RTRN 9FT ADLT (ELECTROSURGICAL) ×1 IMPLANT
FILTER SMOKE EVAC LAPAROSHD (FILTER) ×2 IMPLANT
GAUZE SPONGE 2X2 8PLY STRL LF (GAUZE/BANDAGES/DRESSINGS) ×2 IMPLANT
GLOVE BIO SURGEON STRL SZ7 (GLOVE) ×2 IMPLANT
GLOVE BIOGEL PI IND STRL 7.0 (GLOVE) ×1 IMPLANT
GLOVE BIOGEL PI IND STRL 7.5 (GLOVE) ×1 IMPLANT
GLOVE BIOGEL PI INDICATOR 7.0 (GLOVE) ×1
GLOVE BIOGEL PI INDICATOR 7.5 (GLOVE) ×1
GOWN STRL NON-REIN LRG LVL3 (GOWN DISPOSABLE) ×2 IMPLANT
GOWN STRL REIN XL XLG (GOWN DISPOSABLE) ×6 IMPLANT
KIT BASIN OR (CUSTOM PROCEDURE TRAY) ×2 IMPLANT
NS IRRIG 1000ML POUR BTL (IV SOLUTION) ×2 IMPLANT
POUCH SPECIMEN RETRIEVAL 10MM (ENDOMECHANICALS) ×2 IMPLANT
RINGERS IRRIG 1000ML POUR BTL (IV SOLUTION) IMPLANT
SET CHOLANGIOGRAPH MIX (MISCELLANEOUS) ×2 IMPLANT
SET IRRIG TUBING LAPAROSCOPIC (IRRIGATION / IRRIGATOR) ×2 IMPLANT
SLEEVE XCEL OPT CAN 5 100 (ENDOMECHANICALS) ×2 IMPLANT
SOLUTION ANTI FOG 6CC (MISCELLANEOUS) ×2 IMPLANT
SPONGE GAUZE 2X2 STER 10/PKG (GAUZE/BANDAGES/DRESSINGS) ×2
STRIP CLOSURE SKIN 1/2X4 (GAUZE/BANDAGES/DRESSINGS) ×2 IMPLANT
SUT MNCRL AB 4-0 PS2 18 (SUTURE) ×2 IMPLANT
TOWEL OR 17X26 10 PK STRL BLUE (TOWEL DISPOSABLE) ×2 IMPLANT
TRAY LAP CHOLE (CUSTOM PROCEDURE TRAY) ×2 IMPLANT
TROCAR BLADELESS OPT 5 75 (ENDOMECHANICALS) IMPLANT
TROCAR XCEL BLUNT TIP 100MML (ENDOMECHANICALS) ×2 IMPLANT
TROCAR XCEL NON-BLD 11X100MML (ENDOMECHANICALS) ×2 IMPLANT
TROCAR XCEL NON-BLD 5MMX100MML (ENDOMECHANICALS) ×2 IMPLANT
TUBING INSUFFLATION 10FT LAP (TUBING) ×2 IMPLANT

## 2012-10-07 NOTE — Anesthesia Preprocedure Evaluation (Addendum)
Anesthesia Evaluation  Patient identified by MRN, date of birth, ID band Patient awake    Reviewed: Allergy & Precautions, H&P , NPO status , Patient's Chart, lab work & pertinent test results  Airway Mallampati: II TM Distance: >3 FB Neck ROM: Full    Dental  (+) Dental Advisory Given and Teeth Intact   Pulmonary sleep apnea (Noncompliant with CPAP) , Current Smoker,          Cardiovascular hypertension,     Neuro/Psych PSYCHIATRIC DISORDERS Anxiety Depression negative neurological ROS     GI/Hepatic Neg liver ROS, GERD-  Medicated,  Endo/Other  Morbid obesity  Renal/GU negative Renal ROS     Musculoskeletal negative musculoskeletal ROS (+)   Abdominal (+) + obese,   Peds  Hematology negative hematology ROS (+)   Anesthesia Other Findings   Reproductive/Obstetrics negative OB ROS                          Anesthesia Physical Anesthesia Plan  ASA: III  Anesthesia Plan: General   Post-op Pain Management:    Induction: Intravenous  Airway Management Planned: Oral ETT  Additional Equipment:   Intra-op Plan:   Post-operative Plan: Extubation in OR  Informed Consent: I have reviewed the patients History and Physical, chart, labs and discussed the procedure including the risks, benefits and alternatives for the proposed anesthesia with the patient or authorized representative who has indicated his/her understanding and acceptance.   Dental advisory given  Plan Discussed with: CRNA  Anesthesia Plan Comments:         Anesthesia Quick Evaluation

## 2012-10-07 NOTE — H&P (View-Only) (Signed)
Patient ID: [name], female   DOB: 15-Jul-1985, 27 y.o.   MRN: [id number]  Chief Complaint  Patient presents with  . New Evaluation    eval chole    HPI [name] is a 27 y.o. female.  Referred by Dr. Parke Simmers for evaluation of gallbladder disease  HPI This is a 27 year old female with morbid obesity who presents with several months of intermittent upper abdominal pain. This is associated with nausea, vomiting, and abdominal bloating. She denies any diarrhea. Initially this was associated with eating greasy or spicy foods. Recently the pain has become more persistent and tends to occur whenever she eats any type of food. She was diagnosed with gallstones but no sign of cholecystitis based on an ultrasound performed in the emergency department. Liver function tests were normal. She presents now for surgical evaluation for cholecystectomy. Past Medical History  Diagnosis Date  . Sleep apnea   . Bronchitis   . Hypertension   . Acid reflux   . Pregnancy induced hypertension   . Urinary tract infection   . History of PID   . Abnormal Pap smear     colposcopy, mild dysplasia, HPV  . Gonorrhea   . Chlamydia   . Trichomonas   . Depression     on meds for bipolar  . Anxiety     on meds- stable  . Obesity     Past Surgical History  Procedure Laterality Date  . Wisdom tooth extraction    . Foot surgery    . Tubal ligation    . Cesarean section  05/02/03,05/04/08,10/23/10    x3    Family History  Problem Relation Age of Onset  . Other Neg Hx   . Hypertension Mother   . Heart disease Father   . Stroke Father     Social History History  Substance Use Topics  . Smoking status: Current Every Day Smoker -- 0.25 packs/day for 1 years    Types: Cigarettes  . Smokeless tobacco: Never Used  . Alcohol Use: Yes     Comment: socially    Allergies  Allergen Reactions  . Nsaids Hives, Itching, Rash and Other (See Comments)    TYLENOL ALSO    Current Outpatient  Prescriptions  Medication Sig Dispense Refill  . Lurasidone HCl (LATUDA) 20 MG TABS Take 20 mg by mouth at bedtime.      Marland Kitchen oxyCODONE (OXY IR/ROXICODONE) 5 MG immediate release tablet Take 1-2 tablets (5-10 mg total) by mouth every 6 (six) hours as needed (Moderate to severe pain). For pain  23 tablet  0  . sertraline (ZOLOFT) 50 MG tablet Take 50 mg by mouth every morning.      . diphenhydrAMINE (BENADRYL) 25 MG tablet Take 25 mg by mouth every 6 (six) hours as needed for itching or allergies.      . famotidine (PEPCID) 20 MG tablet Take 1 tablet (20 mg total) by mouth 2 (two) times daily.  10 tablet  0   No current facility-administered medications for this visit.    Review of Systems Review of Systems  Constitutional: Negative for fever, chills and unexpected weight change.  HENT: Negative for hearing loss, congestion, sore throat, trouble swallowing and voice change.   Eyes: Negative for visual disturbance.  Respiratory: Negative for cough and wheezing.   Cardiovascular: Negative for chest pain, palpitations and leg swelling.  Gastrointestinal: Positive for nausea, vomiting, abdominal pain and abdominal distention. Negative for diarrhea, constipation, blood in stool and anal bleeding.  Genitourinary: Negative for hematuria, vaginal bleeding and difficulty urinating.  Musculoskeletal: Negative for arthralgias.  Skin: Negative for rash and wound.  Neurological: Negative for seizures, syncope and headaches.  Hematological: Negative for adenopathy. Does not bruise/bleed easily.  Psychiatric/Behavioral: Negative for confusion.    Blood pressure 132/90, pulse 78, temperature 97.2 F (36.2 C), temperature source Temporal, height 5\' 5"  (1.651 m), weight 301 lb 9.6 oz (136.805 kg), last menstrual period 09/09/2012.  Physical Exam Physical Exam WDWN in NAD HEENT:  EOMI, sclera anicteric Neck:  No masses, no thyromegaly Lungs:  CTA bilaterally; normal respiratory effort CV:  Regular rate  and rhythm; no murmurs Abd:  +bowel sounds, obese, tender in epigastrium and RUQ Ext:  Well-perfused; no edema Skin:  Warm, dry; no sign of jaundice  Data Reviewed Lab Results  Component Value Date   WBC 14.9* 10/02/2012   HGB 13.0 10/02/2012   HCT 36.9 10/02/2012   MCV 79.7 10/02/2012   PLT 365 10/02/2012   Lab Results  Component Value Date   CREATININE 0.63 10/02/2012   BUN 9 10/02/2012   NA 138 10/02/2012   K 3.5 10/02/2012   CL 104 10/02/2012   CO2 23 10/02/2012   Lab Results  Component Value Date   ALT 12 10/02/2012   AST 14 10/02/2012   ALKPHOS 58 10/02/2012   BILITOT 0.2* 10/02/2012   RADIOLOGY REPORT*  Clinical Data: Abdominal pain  COMPLETE ABDOMINAL ULTRASOUND  Comparison: 09/19/2012  Findings:  Gallbladder: Gallstones. Contracted gallbladder, with mild wall  thickness at 4 mm. No pericholecystic fluid. Negative Murphy's  sign.  Common bile duct: Upper normal to mildly prominent at 5 mm  proximally. The mid and distal duct are obscured by poor acoustic  windows/bowel gas artifact. Note that this caliber is unchanged  from the prior.  Liver: Heterogeneity may reflect areas of steatosis. Focal lesion  detection is limited in this setting.  IVC: Appears normal.  Pancreas: No focal abnormality within the head/neck. Portions of  the body/tail obscured by bowel gas artifact/limited acoustic  windows.  Spleen: Normal sonographic appearance, measuring 8 cm.  Right Kidney: Normal sonographic appearance, measuring 12 cm. No  hydronephrosis.  Left Kidney: Suboptimal visualization due to limited acoustic  windows. No overt abnormality. No hydronephrosis. Measures  approximately 11 cm.  Abdominal aorta: No aneurysm identified.  IMPRESSION:  Cholelithiasis. Contracted gallbladder likely accounts for mild  wall thickening. Negative Murphy's sign.  CBD upper normal, unchanged. Correlate with LFTs.  Hepatic steatosis.  Original Report Authenticated By: Jearld Lesch,  M.D.   Assessment    Chronic calculus cholecystitis with biliary colic     Plan    Laparoscopic cholecystectomy with intraoperative cholangiogram.  The surgical procedure has been discussed with the patient.  Potential risks, benefits, alternative treatments, and expected outcomes have been explained.  All of the patient's questions at this time have been answered.  The likelihood of reaching the patient's treatment goal is good.  The patient understand the proposed surgical procedure and wishes to proceed.         Destenee Guerry K. 10/04/2012, 10:06 AM

## 2012-10-07 NOTE — Transfer of Care (Signed)
Immediate Anesthesia Transfer of Care Note  Patient: [name]  Procedure(s) Performed: Procedure(s): LAPAROSCOPIC CHOLECYSTECTOMY WITH INTRAOPERATIVE CHOLANGIOGRAM (N/A)  Patient Location: PACU  Anesthesia Type:General  Level of Consciousness: awake, alert , oriented and patient cooperative  Airway & Oxygen Therapy: Patient Spontanous Breathing and Patient connected to face mask oxygen  Post-op Assessment: Report given to PACU RN and Post -op Vital signs reviewed and stable  Post vital signs: Reviewed and stable  Complications: No apparent anesthesia complications

## 2012-10-07 NOTE — Op Note (Signed)
Laparoscopic Cholecystectomy with IOC Procedure Note  Indications: This patient presents with symptomatic gallbladder disease and will undergo laparoscopic cholecystectomy.  Pre-operative Diagnosis: Calculus of gallbladder with other cholecystitis, without mention of obstruction  Post-operative Diagnosis: Same  Surgeon: Selden Noteboom K.   Assistants: Dr. Estelle Grumbles  Anesthesia: General endotracheal anesthesia  ASA Class: 2  Procedure Details  The patient was seen again in the Holding Room. The risks, benefits, complications, treatment options, and expected outcomes were discussed with the patient. The possibilities of reaction to medication, pulmonary aspiration, perforation of viscus, bleeding, recurrent infection, finding a normal gallbladder, the need for additional procedures, failure to diagnose a condition, the possible need to convert to an open procedure, and creating a complication requiring transfusion or operation were discussed with the patient. The likelihood of improving the patient's symptoms with return to their baseline status is good.  The patient and/or family concurred with the proposed plan, giving informed consent. The site of surgery properly noted. The patient was taken to Operating Room, identified as Kendrick Fries and the procedure verified as Laparoscopic Cholecystectomy with Intraoperative Cholangiogram. A Time Out was held and the above information confirmed.  Prior to the induction of general anesthesia, antibiotic prophylaxis was administered. General endotracheal anesthesia was then administered and tolerated well. After the induction, the abdomen was prepped with Chloraprep and draped in the sterile fashion. The patient was positioned in the supine position.  She has a 5 cm midline incision above her umbilicus.  We used a 5 mm optiview port in the right upper quadrant and cannulated the peritoneal cavity.  Pneumoperitoneum was then created with CO2 and  tolerated well without any adverse changes in the patient's vital signs. There are some omental adhesions in the midline at the area of her previous supraumbilical incision.  An 11-mm port was placed in the subxiphoid position.  Another 5-mm ports was placed in the right upper quadrant. All skin incisions were infiltrated with a local anesthetic agent before making the incision and placing the trocars.  We took down some of the adhesions in the midline and exposed the area just above the umbilicus.  I placed an 11 mm port in this position under direct vision.   We positioned the patient in reverse Trendelenburg, tilted slightly to the patient's left.  The gallbladder was identified, the fundus grasped and retracted cephalad. She has a very large gallbladder with significant adhesions.  Adhesions were lysed bluntly and with the electrocautery where indicated, taking care not to injure any adjacent organs or viscus. The infundibulum was grasped and retracted laterally, exposing the peritoneum overlying the triangle of Calot. This was then divided and exposed in a blunt fashion. A critical view of the cystic duct and cystic artery was obtained.  The cystic duct was clearly identified and bluntly dissected circumferentially. The cystic duct was ligated with a clip distally.   An incision was made in the cystic duct and the [facility] cholangiogram catheter introduced. The catheter was secured using a clip. A cholangiogram was then obtained which showed good visualization of the distal and proximal biliary tree with no sign of filling defects or obstruction.  Contrast flowed easily into the duodenum. The catheter was then removed.   The cystic duct was then ligated with clips and divided. The cystic artery was identified, dissected free, ligated with clips and divided as well.   The gallbladder was dissected from the liver bed in retrograde fashion with the electrocautery. The gallbladder was removed and placed in  an  Endocatch sac. The liver bed was irrigated and inspected. Hemostasis was achieved with the electrocautery. Copious irrigation was utilized and was repeatedly aspirated until clear.  The gallbladder and Endocatch sac were then removed through the supxiphoid port site.    We again inspected the right upper quadrant for hemostasis.  Pneumoperitoneum was released as we removed the trocars.  4-0 Monocryl was used to close the skin.   Benzoin, steri-strips, and clean dressings were applied. The patient was then extubated and brought to the recovery room in stable condition. Instrument, sponge, and needle counts were correct at closure and at the conclusion of the case.   Findings: Cholecystitis with Cholelithiasis  Estimated Blood Loss: Minimal         Drains: none         Specimens: Gallbladder           Complications: None; patient tolerated the procedure well.         Disposition: PACU - hemodynamically stable.         Condition: stable  Wilmon Arms. Corliss Skains, MD, [facility] Surgery  10/07/2012 4:50 PM

## 2012-10-07 NOTE — Interval H&P Note (Signed)
History and Physical Interval Note:  10/07/2012 1:43 PM  [name]  has presented today for surgery, with the diagnosis of chronic choleaystis  The various methods of treatment have been discussed with the patient and family. After consideration of risks, benefits and other options for treatment, the patient has consented to  Procedure(s): LAPAROSCOPIC CHOLECYSTECTOMY WITH INTRAOPERATIVE CHOLANGIOGRAM (N/A) as a surgical intervention .  The patient's history has been reviewed, patient examined, no change in status, stable for surgery.  I have reviewed the patient's chart and labs.  Questions were answered to the patient's satisfaction.     Adil Tugwell K.

## 2012-10-07 NOTE — Anesthesia Postprocedure Evaluation (Signed)
Anesthesia Post Note  Patient: [name]  Procedure(s) Performed: Procedure(s) (LRB): LAPAROSCOPIC CHOLECYSTECTOMY WITH INTRAOPERATIVE CHOLANGIOGRAM (N/A)  Anesthesia type: General  Patient location: PACU  Post pain: Pain level controlled  Post assessment: Post-op Vital signs reviewed  Last Vitals: BP 130/71  Pulse 74  Temp(Src) 37.1 C (Oral)  Resp 20  Ht 5\' 5"  (1.651 m)  Wt 296 lb (134.265 kg)  BMI 49.26 kg/m2  SpO2 99%  LMP 10/03/2012  Post vital signs: Reviewed  Level of consciousness: sedated  Complications: No apparent anesthesia complications

## 2012-10-07 NOTE — Preoperative (Signed)
Beta Blockers   Reason not to administer Beta Blockers:Not Applicable 

## 2012-10-08 ENCOUNTER — Encounter (HOSPITAL_COMMUNITY): Payer: Self-pay | Admitting: Surgery

## 2012-10-11 ENCOUNTER — Emergency Department (HOSPITAL_COMMUNITY): Payer: Medicaid Other

## 2012-10-11 ENCOUNTER — Emergency Department (HOSPITAL_COMMUNITY)
Admission: EM | Admit: 2012-10-11 | Discharge: 2012-10-11 | Disposition: A | Discharge status: Home / Self Care | Payer: Medicaid Other | Attending: Emergency Medicine | Admitting: Emergency Medicine

## 2012-10-11 ENCOUNTER — Encounter (HOSPITAL_COMMUNITY): Payer: Self-pay | Admitting: *Deleted

## 2012-10-11 DIAGNOSIS — Z79899 Other long term (current) drug therapy: Secondary | ICD-10-CM | POA: Insufficient documentation

## 2012-10-11 DIAGNOSIS — E669 Obesity, unspecified: Secondary | ICD-10-CM | POA: Insufficient documentation

## 2012-10-11 DIAGNOSIS — F3289 Other specified depressive episodes: Secondary | ICD-10-CM | POA: Insufficient documentation

## 2012-10-11 DIAGNOSIS — R42 Dizziness and giddiness: Secondary | ICD-10-CM | POA: Insufficient documentation

## 2012-10-11 DIAGNOSIS — F329 Major depressive disorder, single episode, unspecified: Secondary | ICD-10-CM | POA: Insufficient documentation

## 2012-10-11 DIAGNOSIS — I10 Essential (primary) hypertension: Secondary | ICD-10-CM | POA: Insufficient documentation

## 2012-10-11 DIAGNOSIS — F411 Generalized anxiety disorder: Secondary | ICD-10-CM | POA: Insufficient documentation

## 2012-10-11 DIAGNOSIS — R079 Chest pain, unspecified: Secondary | ICD-10-CM

## 2012-10-11 DIAGNOSIS — R0789 Other chest pain: Secondary | ICD-10-CM | POA: Insufficient documentation

## 2012-10-11 DIAGNOSIS — R0602 Shortness of breath: Secondary | ICD-10-CM | POA: Insufficient documentation

## 2012-10-11 DIAGNOSIS — Z87440 Personal history of urinary (tract) infections: Secondary | ICD-10-CM | POA: Insufficient documentation

## 2012-10-11 DIAGNOSIS — Z8709 Personal history of other diseases of the respiratory system: Secondary | ICD-10-CM | POA: Insufficient documentation

## 2012-10-11 DIAGNOSIS — Z8619 Personal history of other infectious and parasitic diseases: Secondary | ICD-10-CM | POA: Insufficient documentation

## 2012-10-11 DIAGNOSIS — F17200 Nicotine dependence, unspecified, uncomplicated: Secondary | ICD-10-CM | POA: Insufficient documentation

## 2012-10-11 DIAGNOSIS — Z8742 Personal history of other diseases of the female genital tract: Secondary | ICD-10-CM | POA: Insufficient documentation

## 2012-10-11 DIAGNOSIS — G4730 Sleep apnea, unspecified: Secondary | ICD-10-CM | POA: Insufficient documentation

## 2012-10-11 LAB — BASIC METABOLIC PANEL
BUN: 8 mg/dL (ref 6–23)
CO2: 27 mEq/L (ref 19–32)
Calcium: 9.4 mg/dL (ref 8.4–10.5)
Chloride: 103 mEq/L (ref 96–112)
Creatinine, Ser: 0.65 mg/dL (ref 0.50–1.10)
GFR calc Af Amer: >90 mL/min (ref >90)
GFR calc non Af Amer: >90 mL/min (ref >90)
Glucose, Bld: 97 mg/dL (ref 70–99)
Potassium: 3.2 mEq/L — ABNORMAL LOW (ref 3.5–5.1)
Sodium: 139 mEq/L (ref 135–145)

## 2012-10-11 LAB — CBC
HCT: 35.8 % — ABNORMAL LOW (ref 36.0–46.0)
Hemoglobin: 12.2 g/dL (ref 12.0–15.0)
MCH: 27.4 pg (ref 26.0–34.0)
MCHC: 34.1 g/dL (ref 30.0–36.0)
MCV: 80.4 fL (ref 78.0–100.0)
Platelets: 339 10*3/uL (ref 150–400)
RBC: 4.45 MIL/uL (ref 3.87–5.11)
RDW: 13.6 % (ref 11.5–15.5)
WBC: 11 10*3/uL — ABNORMAL HIGH (ref 4.0–10.5)

## 2012-10-11 LAB — POCT I-STAT TROPONIN I: Troponin i, poc: 0 ng/mL (ref 0.00–0.08)

## 2012-10-11 MED ORDER — IOHEXOL 350 MG/ML SOLN
100.0000 mL | Freq: Once | INTRAVENOUS | Status: AC | PRN
  Administered 2012-10-11: 100 mL via INTRAVENOUS

## 2012-10-11 NOTE — ED Notes (Signed)
Patient transported to X-ray 

## 2012-10-11 NOTE — ED Notes (Addendum)
Pt reports midsternal chest pain since this am. Pt had gallbladder removed last Thursday. Reports shortness of breath. Chest pain intermittent. Pain lasts 1-2 minutes and then goes away. Also reports lightheadedness.

## 2012-10-11 NOTE — ED Notes (Signed)
Pt is here with chest pain and sob that started this am before getting on the bus.  Pt states pain hurts on inspiration.  Pt had gallbladder removed on Thursday last week

## 2012-10-11 NOTE — ED Notes (Signed)
Patient transported to CT 

## 2012-10-11 NOTE — ED Provider Notes (Signed)
History     CSN: [id number]  Arrival date & time 10/11/12  4098   First MD Initiated Contact with Patient 10/11/12 [phone]      Chief Complaint  Patient presents with  . Chest Pain  . Shortness of Breath    (Consider location/radiation/quality/duration/timing/severity/associated sxs/prior treatment) HPI Comments: Patient presents to the ER for evaluation of chest pain. Patient reports that she had onset of central chest pain that was mild to moderate in nature associated with dizziness and shortness of breath around 4 AM this morning. Patient does admit to eating pizza last night and has had some reflux issues. She did undergo cholecystectomy last week. She has not had any abdominal pain or fever. There is no vomiting or diarrhea. Patient reports that the chest pain is still present, she is no longer short of breath.  Patient is a 27 y.o. female presenting with chest pain and shortness of breath.  Chest Pain Associated symptoms: dizziness and shortness of breath   Shortness of Breath Associated symptoms: chest pain     Past Medical History  Diagnosis Date  . Bronchitis   . Hypertension   . Acid reflux   . Pregnancy induced hypertension   . Urinary tract infection   . History of PID   . Abnormal Pap smear     colposcopy, mild dysplasia, HPV  . Gonorrhea   . Chlamydia   . Trichomonas   . Depression     on meds for bipolar  . Anxiety     on meds- stable  . Obesity   . Sleep apnea     hasnt uses CPAP x 4 years    Past Surgical History  Procedure Laterality Date  . Wisdom tooth extraction    . Foot surgery    . Tubal ligation    . Cesarean section  05/02/03,05/04/08,10/23/10    x3  . Cholecystectomy N/A 10/07/2012    Procedure: LAPAROSCOPIC CHOLECYSTECTOMY WITH INTRAOPERATIVE CHOLANGIOGRAM;  Surgeon: Wilmon Arms. Corliss Skains, MD;  Location: [facility] ORS;  Service: General;  Laterality: N/A;    Family History  Problem Relation Age of Onset  . Other Neg Hx   . Hypertension Mother    . Heart disease Father   . Stroke Father     History  Substance Use Topics  . Smoking status: Current Every Day Smoker -- 0.25 packs/day for 1 years    Types: Cigarettes  . Smokeless tobacco: Never Used  . Alcohol Use: Yes     Comment: socially    OB History   Grav Para Term Preterm Abortions TAB SAB Ect Mult Living   3 3 3       3       Review of Systems  Respiratory: Positive for shortness of breath.   Cardiovascular: Positive for chest pain.  Neurological: Positive for dizziness.  All other systems reviewed and are negative.    Allergies  Nsaids and Tylenol  Home Medications   Current Outpatient Rx  Name  Route  Sig  Dispense  Refill  . Lurasidone HCl (LATUDA) 20 MG TABS   Oral   Take 20 mg by mouth at bedtime.         Marland Kitchen oxyCODONE (OXY IR/ROXICODONE) 5 MG immediate release tablet   Oral   Take 1-2 tablets (5-10 mg total) by mouth every 6 (six) hours as needed (Moderate to severe pain). For pain   23 tablet   0   . oxyCODONE (ROXICODONE) 5 MG immediate release tablet  Oral   Take 1 tablet (5 mg total) by mouth every 4 (four) hours as needed for pain.   40 tablet   0   . sertraline (ZOLOFT) 50 MG tablet   Oral   Take 50 mg by mouth daily with breakfast.            BP 138/96  Pulse 76  Temp(Src) 98.9 F (37.2 C) (Oral)  Resp 20  SpO2 96%  LMP 10/03/2012  Physical Exam  Constitutional: She is oriented to person, place, and time. She appears well-developed and well-nourished. No distress.  HENT:  Head: Normocephalic and atraumatic.  Right Ear: Hearing normal.  Nose: Nose normal.  Mouth/Throat: Oropharynx is clear and moist and mucous membranes are normal.  Eyes: Conjunctivae and EOM are normal. Pupils are equal, round, and reactive to light.  Neck: Normal range of motion. Neck supple.  Cardiovascular: Normal rate, regular rhythm, S1 normal and S2 normal.  Exam reveals no gallop and no friction rub.   No murmur heard. Pulmonary/Chest:  Effort normal and breath sounds normal. No respiratory distress. She exhibits no tenderness.  Abdominal: Soft. Normal appearance and bowel sounds are normal. There is no hepatosplenomegaly. There is no tenderness. There is no rebound, no guarding, no tenderness at McBurney's point and negative Murphy's sign. No hernia.  Musculoskeletal: Normal range of motion.  Neurological: She is alert and oriented to person, place, and time. She has normal strength. No cranial nerve deficit or sensory deficit. Coordination normal. GCS eye subscore is 4. GCS verbal subscore is 5. GCS motor subscore is 6.  Skin: Skin is warm, dry and intact. No rash noted. No cyanosis.  Psychiatric: She has a normal mood and affect. Her speech is normal and behavior is normal. Thought content normal.    ED Course  Procedures (including critical care time)  Labs Reviewed  CBC  BASIC METABOLIC PANEL   Dg Chest 2 View  10/11/2012  *RADIOLOGY REPORT*  Clinical Data: Chest pain.  Shortness of breath.  CHEST - 2 VIEW  Comparison: 03/05/2012.  Findings:  Cardiopericardial silhouette within normal limits. Mediastinal contours normal. Trachea midline.  No airspace disease or effusion.  IMPRESSION: No active cardiopulmonary disease.   Original Report Authenticated By: Andreas Newport, M.D.    Ct Angio Chest Pe W/cm &/or Wo Cm  10/11/2012  *RADIOLOGY REPORT*  Clinical Data: Awoke with mid sternal chest pain today, shortness of breath, recent surgery, lightheaded, question pulmonary embolism  CT ANGIOGRAPHY CHEST  Technique:  Multidetector CT imaging of the chest using the standard protocol during bolus administration of intravenous contrast. Multiplanar reconstructed images including MIPs were obtained and reviewed to evaluate the vascular anatomy.  Contrast: OMNIPAQUE IOHEXOL 350 MG/ML SOLN  Comparison: 10/29/2010  Findings: Aorta normal caliber without aneurysm or dissection. Visualized portion of upper abdomen normal appearance. No  thoracic adenopathy. Minimal respiratory motion artifacts at lung bases. Pulmonary arteries patent. No evidence of pulmonary embolism. Lungs clear. No pleural effusion or pneumothorax. No acute osseous findings.  IMPRESSION: No acute intrathoracic abnormalities. Specifically, no evidence of pulmonary embolism.   Original Report Authenticated By: Ulyses Southward, M.D.      Diagnosis chest pain    MDM  Patient comes to the ER for evaluation of chest pain. Patient describes the pain in the Center of her chest that came on at rest. She had some mild shortness of breath associated with it. She did have some slight pain with taking a deep breath. She did have gallbladder surgery  last week and therefore evaluation for possible PE was initiated. Her cardiac workup was unremarkable. No concern for acute coronary syndrome. Likewise, the study was negative. Patient is not expressing any abdominal pain. There is no tenderness in the right upper quadrant. No concern for postoperative complications from cholecystectomy. Patient reassured, will discharge.        Gilda Crease, MD 10/11/12 1556

## 2012-10-15 ENCOUNTER — Telehealth (INDEPENDENT_AMBULATORY_CARE_PROVIDER_SITE_OTHER): Payer: Self-pay | Admitting: *Deleted

## 2012-10-15 NOTE — Telephone Encounter (Signed)
Patient called to ask for refill of pain medication.  Norco 5/325mg  1 tablet every 4-6 hours as needed for pain #30 no refills. [facility] [phone]

## 2012-10-16 ENCOUNTER — Emergency Department (HOSPITAL_COMMUNITY): Payer: Medicaid Other

## 2012-10-16 ENCOUNTER — Emergency Department (HOSPITAL_COMMUNITY)
Admission: EM | Admit: 2012-10-16 | Discharge: 2012-10-16 | Disposition: A | Discharge status: Home / Self Care | Payer: Medicaid Other | Attending: Emergency Medicine | Admitting: Emergency Medicine

## 2012-10-16 ENCOUNTER — Encounter (HOSPITAL_COMMUNITY): Payer: Self-pay | Admitting: *Deleted

## 2012-10-16 DIAGNOSIS — K219 Gastro-esophageal reflux disease without esophagitis: Secondary | ICD-10-CM | POA: Insufficient documentation

## 2012-10-16 DIAGNOSIS — Z79899 Other long term (current) drug therapy: Secondary | ICD-10-CM | POA: Insufficient documentation

## 2012-10-16 DIAGNOSIS — B9789 Other viral agents as the cause of diseases classified elsewhere: Secondary | ICD-10-CM | POA: Insufficient documentation

## 2012-10-16 DIAGNOSIS — Z8742 Personal history of other diseases of the female genital tract: Secondary | ICD-10-CM | POA: Insufficient documentation

## 2012-10-16 DIAGNOSIS — R109 Unspecified abdominal pain: Secondary | ICD-10-CM

## 2012-10-16 DIAGNOSIS — I10 Essential (primary) hypertension: Secondary | ICD-10-CM

## 2012-10-16 DIAGNOSIS — D649 Anemia, unspecified: Secondary | ICD-10-CM | POA: Insufficient documentation

## 2012-10-16 LAB — URINALYSIS, ROUTINE W REFLEX MICROSCOPIC
Bilirubin Urine: NEGATIVE
Glucose, UA: NEGATIVE mg/dL
Hgb urine dipstick: NEGATIVE
Ketones, ur: NEGATIVE mg/dL
Leukocytes, UA: NEGATIVE
Nitrite: NEGATIVE
Protein, ur: NEGATIVE mg/dL
Specific Gravity, Urine: 1.022 (ref 1.005–1.030)
Urobilinogen, UA: 1 mg/dL (ref 0.0–1.0)
pH: 8 (ref 5.0–8.0)

## 2012-10-16 LAB — CBC WITH DIFFERENTIAL/PLATELET
Basophils Absolute: 0 10*3/uL (ref 0.0–0.1)
Basophils Relative: 0 % (ref 0–1)
Eosinophils Absolute: 0.5 10*3/uL (ref 0.0–0.7)
Eosinophils Relative: 4 % (ref 0–5)
HCT: 34.8 % — ABNORMAL LOW (ref 36.0–46.0)
Hemoglobin: 11.9 g/dL — ABNORMAL LOW (ref 12.0–15.0)
Lymphocytes Relative: 29 % (ref 12–46)
Lymphs Abs: 3.2 10*3/uL (ref 0.7–4.0)
MCH: 27.7 pg (ref 26.0–34.0)
MCHC: 34.2 g/dL (ref 30.0–36.0)
MCV: 81.1 fL (ref 78.0–100.0)
Monocytes Absolute: 0.6 10*3/uL (ref 0.1–1.0)
Monocytes Relative: 6 % (ref 3–12)
Neutro Abs: 6.9 10*3/uL (ref 1.7–7.7)
Neutrophils Relative %: 62 % (ref 43–77)
Platelets: 321 10*3/uL (ref 150–400)
RBC: 4.29 MIL/uL (ref 3.87–5.11)
RDW: 13.7 % (ref 11.5–15.5)
WBC: 11.2 10*3/uL — ABNORMAL HIGH (ref 4.0–10.5)

## 2012-10-16 LAB — COMPREHENSIVE METABOLIC PANEL
ALT: 12 U/L (ref 0–35)
AST: 14 U/L (ref 0–37)
Albumin: 3.2 g/dL — ABNORMAL LOW (ref 3.5–5.2)
Alkaline Phosphatase: 67 U/L (ref 39–117)
BUN: 5 mg/dL — ABNORMAL LOW (ref 6–23)
CO2: 26 mEq/L (ref 19–32)
Calcium: 8.7 mg/dL (ref 8.4–10.5)
Chloride: 104 mEq/L (ref 96–112)
Creatinine, Ser: 0.61 mg/dL (ref 0.50–1.10)
GFR calc Af Amer: >90 mL/min (ref >90)
GFR calc non Af Amer: >90 mL/min (ref >90)
Glucose, Bld: 89 mg/dL (ref 70–99)
Potassium: 3.5 mEq/L (ref 3.5–5.1)
Sodium: 139 mEq/L (ref 135–145)
Total Bilirubin: 0.2 mg/dL — ABNORMAL LOW (ref 0.3–1.2)
Total Protein: 7.3 g/dL (ref 6.0–8.3)

## 2012-10-16 LAB — POCT I-STAT TROPONIN I: Troponin i, poc: 0 ng/mL (ref 0.00–0.08)

## 2012-10-16 LAB — POCT PREGNANCY, URINE: Preg Test, Ur: NEGATIVE

## 2012-10-16 LAB — LIPASE, BLOOD: Lipase: 19 U/L (ref 11–59)

## 2012-10-16 MED ORDER — DICYCLOMINE HCL 10 MG PO CAPS
10.0000 mg | ORAL_CAPSULE | Freq: Once | ORAL | Status: AC
  Administered 2012-10-16: 10 mg via ORAL
  Filled 2012-10-16: qty 1

## 2012-10-16 MED ORDER — OXYCODONE HCL 5 MG PO TABS: 5.0000 mg | ORAL_TABLET | ORAL | Status: DC | PRN

## 2012-10-16 MED ORDER — DOCUSATE SODIUM 100 MG PO CAPS: 100.0000 mg | ORAL_CAPSULE | Freq: Two times a day (BID) | ORAL | Status: DC

## 2012-10-16 NOTE — ED Provider Notes (Signed)
Medical screening examination/treatment/procedure(s) were performed by non-physician practitioner and as supervising physician I was immediately available for consultation/collaboration.   Hurman Horn, MD 10/16/12 2209

## 2012-10-16 NOTE — ED Notes (Signed)
Pt had gallbladder removed last week and continues to have upper abdominal pain.  Passing gas.  No NVD.  Pt reports pain that is sharp

## 2012-10-16 NOTE — ED Provider Notes (Signed)
History     CSN: [id number]  Arrival date & time 10/16/12  1257   First MD Initiated Contact with Patient 10/16/12 1337      No chief complaint on file.   (Consider location/radiation/quality/duration/timing/severity/associated sxs/prior treatment) HPI  27 year old female presents emergency department chief complaint of left upper quadrant abdominal pain.  She is status post laparoscopic cholecystectomy one week ago.  This is performed by Dr. Corliss Skains.  Patient states that she has had sharp pain in the left upper quadrant on and off for the past week since Monday.  The patient took her last oxycodone on Monday.  Has been passing gas but has only made one bowel movement since her surgery.  She denies any vomiting but has had intermittent mild nausea.  She denies any diarrhea.  She rates her pain at a 3-4/10.  Pain is worse after smoking when she feel that she is going to make a BM. She is allergic to both NSAIDs and Tylenol as documented hives and angioedema here at the emergency department previously. Patient denies any urinary symptoms or vaginal symptoms. Denies fevers, chills, myalgias, arthralgias. Denies DOE, SOB, chest tightness or pressure, radiation to left arm, jaw or back, or diaphoresis. Denies dysuria, flank pain, suprapubic pain, frequency, urgency, or hematuria. Denies headaches, light headedness, weakness, visual disturbances.   Past Medical History  Diagnosis Date  . Bronchitis   . Hypertension   . Acid reflux   . Pregnancy induced hypertension   . Urinary tract infection   . History of PID   . Abnormal Pap smear     colposcopy, mild dysplasia, HPV  . Gonorrhea   . Chlamydia   . Trichomonas   . Depression     on meds for bipolar  . Anxiety     on meds- stable  . Obesity   . Sleep apnea     hasnt uses CPAP x 4 years    Past Surgical History  Procedure Laterality Date  . Wisdom tooth extraction    . Foot surgery    . Tubal ligation    . Cesarean section   05/02/03,05/04/08,10/23/10    x3  . Cholecystectomy N/A 10/07/2012    Procedure: LAPAROSCOPIC CHOLECYSTECTOMY WITH INTRAOPERATIVE CHOLANGIOGRAM;  Surgeon: Wilmon Arms. Corliss Skains, MD;  Location: [facility];  Service: General;  Laterality: N/A;    Family History  Problem Relation Age of Onset  . Other Neg Hx   . Hypertension Mother   . Heart disease Father   . Stroke Father     History  Substance Use Topics  . Smoking status: Current Every Day Smoker -- 0.25 packs/day for 1 years    Types: Cigarettes  . Smokeless tobacco: Never Used  . Alcohol Use: Yes     Comment: socially    OB History   Grav Para Term Preterm Abortions TAB SAB Ect Mult Living   3 3 3       3       Review of Systems  Ten systems reviewed and are negative for acute change, except as noted in the HPI.   Allergies  Nsaids and Tylenol  Home Medications   Current Outpatient Rx  Name  Route  Sig  Dispense  Refill  . Lurasidone HCl (LATUDA) 20 MG TABS   Oral   Take 20 mg by mouth at bedtime.         Marland Kitchen oxyCODONE (OXY IR/ROXICODONE) 5 MG immediate release tablet   Oral   Take 1-2 tablets (5-10  mg total) by mouth every 6 (six) hours as needed (Moderate to severe pain). For pain   23 tablet   0   . sertraline (ZOLOFT) 50 MG tablet   Oral   Take 50 mg by mouth daily with breakfast.            BP 154/111  Pulse 99  Temp(Src) 99 F (37.2 C) (Oral)  Resp 18  SpO2 98%  LMP 10/03/2012  Physical Exam  Physical Exam  Nursing note and vitals reviewed. Constitutional: She is oriented to person, place, and time. She appears well-developed and well-nourished. No distress.  HENT:  Head: Normocephalic and atraumatic.  Eyes: Conjunctivae normal and EOM are normal. Pupils are equal, round, and reactive to light. No scleral icterus.  Neck: Normal range of motion.  Cardiovascular: Normal rate, regular rhythm and normal heart sounds.  Exam reveals no gallop and no friction rub.   No murmur heard. Pulmonary/Chest:  Effort normal and breath sounds normal. No respiratory distress.  Abdominal:Several well-healed abdominal port sites/ no drainage, no heat or induration.moderately TTP in the LUQ.  Neurological: She is alert and oriented to person, place, and time.  Skin: Skin is warm and dry. She is not diaphoretic.    ED Course  Procedures (including critical care time)  Labs Reviewed  COMPREHENSIVE METABOLIC PANEL - Abnormal; Notable for the following:    BUN 5 (*)    Albumin 3.2 (*)    Total Bilirubin 0.2 (*)    All other components within normal limits  LIPASE, BLOOD  URINALYSIS, ROUTINE W REFLEX MICROSCOPIC  CBC WITH DIFFERENTIAL  POCT I-STAT TROPONIN I  POCT PREGNANCY, URINE   No results found.   1. Abdominal pain   2. Hypertension       MDM  3:13 PM Patient with moderate abdominal pain. Soft , non-distended, non-surgical abdomen. I have offered bentyl.  4:23 PM BP 154/111  Pulse 99  Temp(Src) 99 F (37.2 C) (Oral)  Resp 18  SpO2 98%  LMP 10/03/2012 Patient labs negative. No concern for bowel perf or free air. She is Wells low rish.  No hemoptysis, Dyspnea, cough or chest pain. Pain is non-pleuritic, non positional and I do not suspect Post OP PE. She is hypertensive and needs to follow up with her PCP.      Arthor Captain, PA-C 10/16/12 1657

## 2012-10-22 ENCOUNTER — Encounter (INDEPENDENT_AMBULATORY_CARE_PROVIDER_SITE_OTHER): Payer: Medicaid Other | Admitting: Surgery

## 2012-11-11 ENCOUNTER — Encounter (INDEPENDENT_AMBULATORY_CARE_PROVIDER_SITE_OTHER): Payer: Medicaid Other | Admitting: Surgery

## 2012-11-20 ENCOUNTER — Emergency Department (HOSPITAL_COMMUNITY)
Admission: EM | Admit: 2012-11-20 | Discharge: 2012-11-20 | Disposition: A | Discharge status: Home / Self Care | Payer: Medicaid Other | Attending: Emergency Medicine | Admitting: Emergency Medicine

## 2012-11-20 ENCOUNTER — Encounter (HOSPITAL_COMMUNITY): Payer: Self-pay | Admitting: Family Medicine

## 2012-11-20 ENCOUNTER — Emergency Department (HOSPITAL_COMMUNITY): Payer: Medicaid Other

## 2012-11-20 DIAGNOSIS — F411 Generalized anxiety disorder: Secondary | ICD-10-CM | POA: Insufficient documentation

## 2012-11-20 DIAGNOSIS — F329 Major depressive disorder, single episode, unspecified: Secondary | ICD-10-CM | POA: Insufficient documentation

## 2012-11-20 DIAGNOSIS — Z8619 Personal history of other infectious and parasitic diseases: Secondary | ICD-10-CM | POA: Insufficient documentation

## 2012-11-20 DIAGNOSIS — Z9889 Other specified postprocedural states: Secondary | ICD-10-CM | POA: Insufficient documentation

## 2012-11-20 DIAGNOSIS — Z87440 Personal history of urinary (tract) infections: Secondary | ICD-10-CM | POA: Insufficient documentation

## 2012-11-20 DIAGNOSIS — R197 Diarrhea, unspecified: Secondary | ICD-10-CM | POA: Insufficient documentation

## 2012-11-20 DIAGNOSIS — F17200 Nicotine dependence, unspecified, uncomplicated: Secondary | ICD-10-CM | POA: Insufficient documentation

## 2012-11-20 DIAGNOSIS — R109 Unspecified abdominal pain: Secondary | ICD-10-CM

## 2012-11-20 DIAGNOSIS — Z9049 Acquired absence of other specified parts of digestive tract: Secondary | ICD-10-CM

## 2012-11-20 DIAGNOSIS — I10 Essential (primary) hypertension: Secondary | ICD-10-CM | POA: Insufficient documentation

## 2012-11-20 DIAGNOSIS — Z9981 Dependence on supplemental oxygen: Secondary | ICD-10-CM | POA: Insufficient documentation

## 2012-11-20 DIAGNOSIS — Z8679 Personal history of other diseases of the circulatory system: Secondary | ICD-10-CM | POA: Insufficient documentation

## 2012-11-20 DIAGNOSIS — R110 Nausea: Secondary | ICD-10-CM | POA: Insufficient documentation

## 2012-11-20 DIAGNOSIS — D649 Anemia, unspecified: Secondary | ICD-10-CM

## 2012-11-20 DIAGNOSIS — Z9851 Tubal ligation status: Secondary | ICD-10-CM | POA: Insufficient documentation

## 2012-11-20 DIAGNOSIS — G4730 Sleep apnea, unspecified: Secondary | ICD-10-CM | POA: Insufficient documentation

## 2012-11-20 DIAGNOSIS — F3289 Other specified depressive episodes: Secondary | ICD-10-CM | POA: Insufficient documentation

## 2012-11-20 DIAGNOSIS — E669 Obesity, unspecified: Secondary | ICD-10-CM | POA: Insufficient documentation

## 2012-11-20 DIAGNOSIS — Z8742 Personal history of other diseases of the female genital tract: Secondary | ICD-10-CM | POA: Insufficient documentation

## 2012-11-20 DIAGNOSIS — Z3202 Encounter for pregnancy test, result negative: Secondary | ICD-10-CM | POA: Insufficient documentation

## 2012-11-20 DIAGNOSIS — Z8709 Personal history of other diseases of the respiratory system: Secondary | ICD-10-CM | POA: Insufficient documentation

## 2012-11-20 LAB — CBC WITH DIFFERENTIAL/PLATELET
Basophils Absolute: 0 10*3/uL (ref 0.0–0.1)
Basophils Relative: 0 % (ref 0–1)
Eosinophils Absolute: 0.4 10*3/uL (ref 0.0–0.7)
Eosinophils Relative: 3 % (ref 0–5)
HCT: 35.1 % — ABNORMAL LOW (ref 36.0–46.0)
Hemoglobin: 11.9 g/dL — ABNORMAL LOW (ref 12.0–15.0)
Lymphocytes Relative: 26 % (ref 12–46)
Lymphs Abs: 3.2 10*3/uL (ref 0.7–4.0)
MCH: 27.7 pg (ref 26.0–34.0)
MCHC: 33.9 g/dL (ref 30.0–36.0)
MCV: 81.6 fL (ref 78.0–100.0)
Monocytes Absolute: 0.5 10*3/uL (ref 0.1–1.0)
Monocytes Relative: 4 % (ref 3–12)
Neutro Abs: 8.6 10*3/uL — ABNORMAL HIGH (ref 1.7–7.7)
Neutrophils Relative %: 68 % (ref 43–77)
Platelets: 318 10*3/uL (ref 150–400)
RBC: 4.3 MIL/uL (ref 3.87–5.11)
RDW: 14.5 % (ref 11.5–15.5)
WBC: 12.7 10*3/uL — ABNORMAL HIGH (ref 4.0–10.5)

## 2012-11-20 LAB — URINALYSIS, ROUTINE W REFLEX MICROSCOPIC
Glucose, UA: NEGATIVE mg/dL
Hgb urine dipstick: NEGATIVE
Ketones, ur: NEGATIVE mg/dL
Nitrite: NEGATIVE
Protein, ur: NEGATIVE mg/dL
Specific Gravity, Urine: 1.024 (ref 1.005–1.030)
Urobilinogen, UA: 1 mg/dL (ref 0.0–1.0)
pH: 7 (ref 5.0–8.0)

## 2012-11-20 LAB — COMPREHENSIVE METABOLIC PANEL
ALT: 9 U/L (ref 0–35)
AST: 13 U/L (ref 0–37)
Albumin: 3.3 g/dL — ABNORMAL LOW (ref 3.5–5.2)
Alkaline Phosphatase: 58 U/L (ref 39–117)
BUN: 10 mg/dL (ref 6–23)
CO2: 26 mEq/L (ref 19–32)
Calcium: 9.1 mg/dL (ref 8.4–10.5)
Chloride: 105 mEq/L (ref 96–112)
Creatinine, Ser: 0.61 mg/dL (ref 0.50–1.10)
GFR calc Af Amer: >90 mL/min (ref >90)
GFR calc non Af Amer: >90 mL/min (ref >90)
Glucose, Bld: 97 mg/dL (ref 70–99)
Potassium: 3.3 mEq/L — ABNORMAL LOW (ref 3.5–5.1)
Sodium: 137 mEq/L (ref 135–145)
Total Bilirubin: 0.2 mg/dL — ABNORMAL LOW (ref 0.3–1.2)
Total Protein: 7.1 g/dL (ref 6.0–8.3)

## 2012-11-20 LAB — CG4 I-STAT (LACTIC ACID): Lactic Acid, Venous: 0.65 mmol/L (ref 0.5–2.2)

## 2012-11-20 LAB — POCT I-STAT TROPONIN I: Troponin i, poc: 0.01 ng/mL (ref 0.00–0.08)

## 2012-11-20 LAB — LIPASE, BLOOD: Lipase: 31 U/L (ref 11–59)

## 2012-11-20 LAB — POCT PREGNANCY, URINE: Preg Test, Ur: NEGATIVE

## 2012-11-20 LAB — URINE MICROSCOPIC-ADD ON

## 2012-11-20 MED ORDER — POTASSIUM CHLORIDE CRYS ER 20 MEQ PO TBCR
40.0000 meq | EXTENDED_RELEASE_TABLET | Freq: Once | ORAL | Status: AC
  Administered 2012-11-20: 40 meq via ORAL
  Filled 2012-11-20: qty 2

## 2012-11-20 MED ORDER — ONDANSETRON HCL 4 MG PO TABS: 4.0000 mg | ORAL_TABLET | Freq: Four times a day (QID) | ORAL | Status: DC

## 2012-11-20 MED ORDER — GI COCKTAIL ~~LOC~~
30.0000 mL | Freq: Once | ORAL | Status: AC
  Administered 2012-11-20: 30 mL via ORAL
  Filled 2012-11-20: qty 30

## 2012-11-20 NOTE — ED Provider Notes (Signed)
Presents with epigastric pain onset one week ago. Pain is presentlyminimal. It is brought on by eating greasy and fried foods, which she has been told by her physicians to avoid. Patient in no distress.  Doug Sou, MD 11/20/12 [phone]

## 2012-11-20 NOTE — ED Notes (Signed)
Pt complaining of upper abdominal pain associated with N,V x 1 week. sts it feels like when she was having gallbladder issues.

## 2012-11-20 NOTE — ED Provider Notes (Signed)
History     CSN: [id number]  Arrival date & time 11/20/12  1743   First MD Initiated Contact with Patient 11/20/12 1835      Chief Complaint  Patient presents with  . Abdominal Pain    (Consider location/radiation/quality/duration/timing/severity/associated sxs/prior treatment) Patient is a 27 y.o. female presenting with abdominal pain. The history is provided by the patient. No language interpreter was used.  Abdominal Pain Associated symptoms include abdominal pain and nausea. Pertinent negatives include no chest pain, chills, fatigue, fever, headaches, neck pain, numbness, sore throat, vomiting or weakness.  [name] is a 27 y/o F with PMHx of HTN, acid reflux, s/p cholecystectomy on 10/07/2012 by Dr. Corliss Skains presenting to the ED with abdominal pain x 1 week, localized to the upper epigastric region, described as an intermittent aching sensation that lasts approximately a couple of second - with mild radiation to the right side of the back - rated pain 5/10. Stated that the pain worsens with eating foods high in fat and grease. Stated that she has not been using anything for the discomfort. Stated that she has been feeling nauseous. Stated that she had diarrhea for 2 days this past week - denied mucous and blood. Denied hematochezia, melena, fever, chills, weakness, hematuria, dysuria, vaginal discharge, vomiting. Reported that she had an appointment with Dr. Harlon Flor for follow-up, but she missed the appointment - stated that she has not re-scheduled.     Past Medical History  Diagnosis Date  . Bronchitis   . Hypertension   . Acid reflux   . Pregnancy induced hypertension   . Urinary tract infection   . History of PID   . Abnormal Pap smear     colposcopy, mild dysplasia, HPV  . Gonorrhea   . Chlamydia   . Trichomonas   . Depression     on meds for bipolar  . Anxiety     on meds- stable  . Obesity   . Sleep apnea     hasnt uses CPAP x 4 years    Past Surgical History   Procedure Laterality Date  . Wisdom tooth extraction    . Foot surgery    . Tubal ligation    . Cesarean section  05/02/03,05/04/08,10/23/10    x3  . Cholecystectomy N/A 10/07/2012    Procedure: LAPAROSCOPIC CHOLECYSTECTOMY WITH INTRAOPERATIVE CHOLANGIOGRAM;  Surgeon: Wilmon Arms. Corliss Skains, MD;  Location: [facility];  Service: General;  Laterality: N/A;    Family History  Problem Relation Age of Onset  . Other Neg Hx   . Hypertension Mother   . Heart disease Father   . Stroke Father     History  Substance Use Topics  . Smoking status: Current Every Day Smoker -- 0.25 packs/day for 1 years    Types: Cigarettes  . Smokeless tobacco: Never Used  . Alcohol Use: Yes     Comment: socially    OB History   Grav Para Term Preterm Abortions TAB SAB Ect Mult Living   3 3 3       3       Review of Systems  Constitutional: Negative for fever, chills and fatigue.  HENT: Negative for sore throat, trouble swallowing, neck pain and neck stiffness.   Respiratory: Negative for chest tightness and shortness of breath.   Cardiovascular: Negative for chest pain.  Gastrointestinal: Positive for nausea, abdominal pain and diarrhea. Negative for vomiting and constipation.  Genitourinary: Negative for dysuria, hematuria, flank pain, decreased urine volume, vaginal bleeding, vaginal  discharge, difficulty urinating and vaginal pain.  Neurological: Negative for dizziness, weakness, light-headedness, numbness and headaches.  All other systems reviewed and are negative.    Allergies  Nsaids and Tylenol  Home Medications   Current Outpatient Rx  Name  Route  Sig  Dispense  Refill  . Lurasidone HCl (LATUDA) 20 MG TABS   Oral   Take 20 mg by mouth at bedtime.         . sertraline (ZOLOFT) 50 MG tablet   Oral   Take 50 mg by mouth daily with breakfast.          . ondansetron (ZOFRAN) 4 MG tablet   Oral   Take 1 tablet (4 mg total) by mouth every 6 (six) hours.   12 tablet   0     BP 125/79   Pulse 83  Temp(Src) 98.6 F (37 C) (Oral)  Resp 18  SpO2 100%  LMP 10/08/2012  Physical Exam  Nursing note and vitals reviewed. Constitutional: She is oriented to person, place, and time. She appears well-developed and well-nourished. No distress.  HENT:  Head: Normocephalic and atraumatic.  Eyes: Conjunctivae and EOM are normal. Pupils are equal, round, and reactive to light. Right eye exhibits no discharge. Left eye exhibits no discharge.  Neck: Normal range of motion. Neck supple.  Negative neck stiffness  Negative nuchal rigidity  Cardiovascular: Normal rate, regular rhythm and normal heart sounds.  Exam reveals no friction rub.   No murmur heard. Pulses:      Radial pulses are 2+ on the right side, and 2+ on the left side.       Dorsalis pedis pulses are 2+ on the right side, and 2+ on the left side.  Pulmonary/Chest: Effort normal and breath sounds normal. No respiratory distress. She has no wheezes. She has no rales.  Abdominal: Soft. Normal appearance and bowel sounds are normal. She exhibits no distension. There is no hepatosplenomegaly. There is tenderness (epigastric and RUQ) in the right upper quadrant and epigastric area. There is no rigidity, no rebound, no guarding, no CVA tenderness, no tenderness at McBurney's point and negative Murphy's sign. No hernia.  Obese Negative Psoas Negative obtruator Negative murphy's sign  Lymphadenopathy:    She has no cervical adenopathy.  Neurological: She is alert and oriented to person, place, and time.  Skin: Skin is warm and dry. No rash noted. She is not diaphoretic. No erythema.  Psychiatric: She has a normal mood and affect. Her behavior is normal. Thought content normal.    ED Course  Procedures (including critical care time)  10:08PM Discussed findings of labs. Patient reported abdominal discomfort - GI cocktail ordered, accepted medications after numerous refusals. Patient reported that she was supposed to follow-up  with Dr. Harlon Flor, but she missed the appointment.   Medications  potassium chloride SA (K-DUR,KLOR-CON) CR tablet 40 mEq (40 mEq Oral Given 11/20/12 2154)  gi cocktail (Maalox,Lidocaine,Donnatal) (30 mLs Oral Given 11/20/12 2234)     Labs Reviewed  CBC WITH DIFFERENTIAL - Abnormal; Notable for the following:    WBC 12.7 (*)    Hemoglobin 11.9 (*)    HCT 35.1 (*)    Neutro Abs 8.6 (*)    All other components within normal limits  URINALYSIS, ROUTINE W REFLEX MICROSCOPIC - Abnormal; Notable for the following:    Bilirubin Urine SMALL (*)    Leukocytes, UA TRACE (*)    All other components within normal limits  COMPREHENSIVE METABOLIC PANEL - Abnormal; Notable  for the following:    Potassium 3.3 (*)    Albumin 3.3 (*)    Total Bilirubin 0.2 (*)    All other components within normal limits  URINE MICROSCOPIC-ADD ON - Abnormal; Notable for the following:    Squamous Epithelial / LPF FEW (*)    Bacteria, UA FEW (*)    All other components within normal limits  LIPASE, BLOOD  CG4 I-STAT (LACTIC ACID)  POCT I-STAT TROPONIN I  POCT PREGNANCY, URINE   Dg Abd Acute W/chest  11/20/2012   *RADIOLOGY REPORT*  Clinical Data: Upper abdominal pain  ACUTE ABDOMEN SERIES (ABDOMEN 2 VIEW & CHEST 1 VIEW)  Comparison: 10/16/2012  Findings: The lungs clear.  Heart size normal.  No effusion. Vascular clips in the right upper abdomen.  No free air.  Normal bowel gas pattern.  Regional bones unremarkable.  No abnormal abdominal calcifications.  IMPRESSION: 1.  Normal bowel gas pattern. 2.  No free air. 3.  No acute cardiopulmonary disease.   Original Report Authenticated By: D. Andria Rhein, MD     1. Abdominal pain   2. S/P laparoscopic cholecystectomy   3. Anemia       MDM  Recent surgery on 10/07/2012 cholecystectomy - complaining of epigastric and RUQ pain. Negative acute abdomen, negative peritoneal signs noted. Well's score low for PE - CT angio of chest performed on 10/11/2012 - negative evidence  of PE. Lactic acid negative elevation. Lipase negative. Negative elevated troponins. Lowered Hgb (11.9) and Hct (35.1) - trend seen when reviewed patient's labs. Mildly low potassium (3.3) - potassium PO given. Urine negative for infection. Negative findings on abdominal xray - normal bowel gas - doubt obstruction or perforation. Abdominal pain with unknown etiology, suspected to be due to diet of patient - eating fatty, greasy foods. Discussed case and findings with Dr. Alden Server - Dr. Alden Server saw and assessed patient - cleared patient for discharge. Patient stable, afebrile. Discharged patient. Referred patient to PCP and surgeon, Dr. Harlon Flor for follow-up. Discussed with patient to get blood work re-checked regarding anemia and mildly low potassium level. Discharged with anti-emetic medications. Discussed with patient to refrain from eating foods high in fat and grease. Discussed with patient to continue to rest and stay hydrated. Discussed with patient to continue to monitor symptoms and if symptoms are to worsen or change to report back to the ED - strict return instructions given. Patient agreed to plan of care, understood, all questions answered.                Raymon Mutton, PA-C 11/21/12 [phone]

## 2012-11-21 NOTE — ED Provider Notes (Signed)
Medical screening examination/treatment/procedure(s) were conducted as a shared visit with non-physician practitioner(s) and myself.  I personally evaluated the patient during the encounter  Doug Sou, MD 11/21/12 [phone]

## 2012-11-29 ENCOUNTER — Encounter (INDEPENDENT_AMBULATORY_CARE_PROVIDER_SITE_OTHER): Payer: Self-pay | Admitting: Surgery

## 2012-11-30 ENCOUNTER — Inpatient Hospital Stay (HOSPITAL_COMMUNITY): Payer: Medicaid Other

## 2012-11-30 ENCOUNTER — Encounter (HOSPITAL_COMMUNITY): Payer: Self-pay | Admitting: *Deleted

## 2012-11-30 ENCOUNTER — Inpatient Hospital Stay (HOSPITAL_COMMUNITY)
Admission: AD | Admit: 2012-11-30 | Discharge: 2012-11-30 | Disposition: A | Discharge status: Home / Self Care | Payer: Medicaid Other | Source: Ambulatory Visit | Attending: Emergency Medicine | Admitting: Emergency Medicine

## 2012-11-30 DIAGNOSIS — F17200 Nicotine dependence, unspecified, uncomplicated: Secondary | ICD-10-CM | POA: Insufficient documentation

## 2012-11-30 DIAGNOSIS — Z3202 Encounter for pregnancy test, result negative: Secondary | ICD-10-CM | POA: Insufficient documentation

## 2012-11-30 DIAGNOSIS — Z79899 Other long term (current) drug therapy: Secondary | ICD-10-CM | POA: Insufficient documentation

## 2012-11-30 DIAGNOSIS — F411 Generalized anxiety disorder: Secondary | ICD-10-CM | POA: Insufficient documentation

## 2012-11-30 DIAGNOSIS — Z8709 Personal history of other diseases of the respiratory system: Secondary | ICD-10-CM | POA: Insufficient documentation

## 2012-11-30 DIAGNOSIS — F329 Major depressive disorder, single episode, unspecified: Secondary | ICD-10-CM | POA: Insufficient documentation

## 2012-11-30 DIAGNOSIS — Z8669 Personal history of other diseases of the nervous system and sense organs: Secondary | ICD-10-CM | POA: Insufficient documentation

## 2012-11-30 DIAGNOSIS — Z8619 Personal history of other infectious and parasitic diseases: Secondary | ICD-10-CM | POA: Insufficient documentation

## 2012-11-30 DIAGNOSIS — F3289 Other specified depressive episodes: Secondary | ICD-10-CM | POA: Insufficient documentation

## 2012-11-30 DIAGNOSIS — R109 Unspecified abdominal pain: Secondary | ICD-10-CM | POA: Insufficient documentation

## 2012-11-30 DIAGNOSIS — N898 Other specified noninflammatory disorders of vagina: Secondary | ICD-10-CM | POA: Insufficient documentation

## 2012-11-30 DIAGNOSIS — Z87440 Personal history of urinary (tract) infections: Secondary | ICD-10-CM | POA: Insufficient documentation

## 2012-11-30 DIAGNOSIS — Z9089 Acquired absence of other organs: Secondary | ICD-10-CM | POA: Insufficient documentation

## 2012-11-30 DIAGNOSIS — N730 Acute parametritis and pelvic cellulitis: Secondary | ICD-10-CM | POA: Insufficient documentation

## 2012-11-30 DIAGNOSIS — Z9851 Tubal ligation status: Secondary | ICD-10-CM | POA: Insufficient documentation

## 2012-11-30 DIAGNOSIS — R1031 Right lower quadrant pain: Secondary | ICD-10-CM

## 2012-11-30 DIAGNOSIS — Z8719 Personal history of other diseases of the digestive system: Secondary | ICD-10-CM | POA: Insufficient documentation

## 2012-11-30 DIAGNOSIS — I10 Essential (primary) hypertension: Secondary | ICD-10-CM | POA: Insufficient documentation

## 2012-11-30 DIAGNOSIS — Z8742 Personal history of other diseases of the female genital tract: Secondary | ICD-10-CM | POA: Insufficient documentation

## 2012-11-30 HISTORY — DX: Benign neoplasm of connective and other soft tissue, unspecified: D21.9

## 2012-11-30 LAB — WET PREP, GENITAL
Clue Cells Wet Prep HPF POC: NONE SEEN
Trich, Wet Prep: NONE SEEN
Yeast Wet Prep HPF POC: NONE SEEN

## 2012-11-30 LAB — URINALYSIS, ROUTINE W REFLEX MICROSCOPIC
Bilirubin Urine: NEGATIVE
Glucose, UA: NEGATIVE mg/dL
Ketones, ur: NEGATIVE mg/dL
Nitrite: NEGATIVE
Protein, ur: NEGATIVE mg/dL
Specific Gravity, Urine: >1.03 — ABNORMAL HIGH (ref 1.005–1.030)
Urobilinogen, UA: 0.2 mg/dL (ref 0.0–1.0)
pH: 6 (ref 5.0–8.0)

## 2012-11-30 LAB — URINE MICROSCOPIC-ADD ON

## 2012-11-30 LAB — CBC
HCT: 33.5 % — ABNORMAL LOW (ref 36.0–46.0)
Hemoglobin: 11 g/dL — ABNORMAL LOW (ref 12.0–15.0)
MCH: 27 pg (ref 26.0–34.0)
MCHC: 32.8 g/dL (ref 30.0–36.0)
MCV: 82.1 fL (ref 78.0–100.0)
Platelets: 283 10*3/uL (ref 150–400)
RBC: 4.08 MIL/uL (ref 3.87–5.11)
RDW: 14.2 % (ref 11.5–15.5)
WBC: 14.6 10*3/uL — ABNORMAL HIGH (ref 4.0–10.5)

## 2012-11-30 LAB — POCT PREGNANCY, URINE: Preg Test, Ur: NEGATIVE

## 2012-11-30 MED ORDER — IOHEXOL 300 MG/ML  SOLN
100.0000 mL | Freq: Once | INTRAMUSCULAR | Status: AC | PRN
  Administered 2012-11-30: 100 mL via INTRAVENOUS

## 2012-11-30 MED ORDER — HYDROMORPHONE HCL PF 1 MG/ML IJ SOLN
1.0000 mg | Freq: Once | INTRAMUSCULAR | Status: AC
  Administered 2012-11-30: 1 mg via INTRAMUSCULAR

## 2012-11-30 MED ORDER — HYDROMORPHONE HCL PF 1 MG/ML IJ SOLN
1.0000 mg | Freq: Once | INTRAMUSCULAR | Status: AC
  Administered 2012-11-30: 1 mg via INTRAVENOUS
  Filled 2012-11-30: qty 1

## 2012-11-30 MED ORDER — ONDANSETRON HCL 4 MG/2ML IJ SOLN
4.0000 mg | Freq: Once | INTRAMUSCULAR | Status: AC
  Administered 2012-11-30: 4 mg via INTRAVENOUS
  Filled 2012-11-30: qty 2

## 2012-11-30 MED ORDER — MORPHINE SULFATE 4 MG/ML IJ SOLN
4.0000 mg | Freq: Once | INTRAMUSCULAR | Status: DC
  Filled 2012-11-30: qty 1

## 2012-11-30 MED ORDER — CEFTRIAXONE SODIUM 250 MG IJ SOLR
250.0000 mg | Freq: Once | INTRAMUSCULAR | Status: AC
  Administered 2012-11-30: 250 mg via INTRAMUSCULAR
  Filled 2012-11-30: qty 250

## 2012-11-30 MED ORDER — HYDROMORPHONE HCL PF 1 MG/ML IJ SOLN
INTRAMUSCULAR | Status: AC
  Filled 2012-11-30: qty 1

## 2012-11-30 MED ORDER — HYDROCODONE-ACETAMINOPHEN 5-325 MG PO TABS: 1.0000 | ORAL_TABLET | Freq: Four times a day (QID) | ORAL | Status: DC | PRN

## 2012-11-30 MED ORDER — AZITHROMYCIN 250 MG PO TABS
1000.0000 mg | ORAL_TABLET | Freq: Once | ORAL | Status: AC
  Administered 2012-11-30: 1000 mg via ORAL
  Filled 2012-11-30: qty 4

## 2012-11-30 MED ORDER — IOHEXOL 300 MG/ML  SOLN
50.0000 mL | Freq: Once | INTRAMUSCULAR | Status: AC | PRN
  Administered 2012-11-30: 50 mL via ORAL

## 2012-11-30 MED ORDER — HYDROMORPHONE HCL PF 1 MG/ML IJ SOLN
1.0000 mg | Freq: Once | INTRAMUSCULAR | Status: AC
  Administered 2012-11-30: 1 mg via INTRAMUSCULAR
  Filled 2012-11-30: qty 1

## 2012-11-30 MED ORDER — DOXYCYCLINE HYCLATE 100 MG PO CAPS: 100.0000 mg | ORAL_CAPSULE | Freq: Two times a day (BID) | ORAL | Status: DC

## 2012-11-30 NOTE — ED Provider Notes (Signed)
History    CSN: [id number] Arrival date & time 11/30/12  4098  First MD Initiated Contact with Patient 11/30/12 1504     Chief Complaint  Patient presents with  . Pelvic Pain   (Consider location/radiation/quality/duration/timing/severity/associated sxs/prior Treatment) HPI [name] is a 27 y.o. female who presents to ED with complaint of abdominal pain for 2 days ago. States worsening. Pain with movement. No fever, nausea, vomiting, no changes in bowels, no urinary symptoms. Pain mainly in right lower quadrant. Admits to vaginal discharge. No medications taken. Pt states feels like her PID in the past. [facility] work, and pelvic and renal US done. All negative except for possible PID vs cervisitis. Treated with zithromax 1g, rocephin 250mg . PT sent here to r/o surgical intra abdominal pathology.   Past Medical History  Diagnosis Date  . Bronchitis   . Hypertension   . Acid reflux   . Pregnancy induced hypertension   . Urinary tract infection   . History of PID   . Abnormal Pap smear     colposcopy, mild dysplasia, HPV  . Gonorrhea   . Chlamydia   . Trichomonas   . Depression     on meds for bipolar  . Anxiety     on meds- stable  . Obesity   . Sleep apnea     hasnt uses CPAP x 4 years  . Fibroid    Past Surgical History  Procedure Laterality Date  . Wisdom tooth extraction    . Foot surgery    . Tubal ligation    . Cesarean section  05/02/03,05/04/08,10/23/10    x3  . Cholecystectomy N/A 10/07/2012    Procedure: LAPAROSCOPIC CHOLECYSTECTOMY WITH INTRAOPERATIVE CHOLANGIOGRAM;  Surgeon: Wilmon Arms. Corliss Skains, MD;  Location: [facility];  Service: General;  Laterality: N/A;   Family History  Problem Relation Age of Onset  . Other Neg Hx   . Hypertension Mother   . Heart disease Father   . Stroke Father    History  Substance Use Topics  . Smoking status: Current Every Day Smoker -- 0.25 packs/day for 1 years    Types: Cigarettes   . Smokeless tobacco: Never Used  . Alcohol Use: Yes     Comment: socially   OB History   Grav Para Term Preterm Abortions TAB SAB Ect Mult Living   3 3 3       3      Review of Systems  Constitutional: Negative for fever, chills and fatigue.  HENT: Negative for neck stiffness and ear discharge.   Respiratory: Negative.   Cardiovascular: Negative.   Gastrointestinal: Positive for abdominal pain. Negative for nausea, vomiting, diarrhea, constipation and blood in stool.  Genitourinary: Positive for vaginal discharge and pelvic pain. Negative for dysuria, urgency, flank pain and vaginal bleeding.  All other systems reviewed and are negative.    Allergies  Nsaids and Tylenol  Home Medications   Current Outpatient Rx  Name  Route  Sig  Dispense  Refill  . acetaminophen (TYLENOL) 325 MG tablet   Oral   Take 650 mg by mouth every 6 (six) hours as needed for pain.         Marland Kitchen ibuprofen (ADVIL,MOTRIN) 200 MG tablet   Oral   Take 400 mg by mouth every 6 (six) hours as needed for pain.         . Lurasidone HCl (LATUDA) 20 MG TABS   Oral   Take  20 mg by mouth at bedtime.         . sertraline (ZOLOFT) 50 MG tablet   Oral   Take 50 mg by mouth daily with breakfast.           BP 161/94  Pulse 86  Temp(Src) 100.1 F (37.8 C) (Oral)  Resp 16  Ht 5\' 5"  (1.651 m)  Wt 302 lb (136.986 kg)  BMI 50.26 kg/m2  SpO2 100%  LMP 11/22/2012 Physical Exam  Nursing note and vitals reviewed. Constitutional: She is oriented to person, place, and time. She appears well-developed and well-nourished. No distress.  Eyes: Conjunctivae are normal.  Neck: Neck supple.  Cardiovascular: Normal rate, regular rhythm and normal heart sounds.   Pulmonary/Chest: Effort normal and breath sounds normal. No respiratory distress. She has no wheezes. She has no rales.  Abdominal: Soft. Bowel sounds are normal. She exhibits no distension. There is tenderness. There is guarding. There is no rebound.   RLQ and periumbilical tenderness, guarding  Neurological: She is alert and oriented to person, place, and time.  Skin: Skin is warm and dry.    ED Course  Procedures (including critical care time) Results for orders placed during the hospital encounter of 11/30/12  WET PREP, GENITAL      Result Value Range   Yeast Wet Prep HPF POC NONE SEEN  NONE SEEN   Trich, Wet Prep NONE SEEN  NONE SEEN   Clue Cells Wet Prep HPF POC NONE SEEN  NONE SEEN   WBC, Wet Prep HPF POC MANY (*) NONE SEEN  URINALYSIS, ROUTINE W REFLEX MICROSCOPIC      Result Value Range   Color, Urine YELLOW  YELLOW   APPearance CLEAR  CLEAR   Specific Gravity, Urine >1.030 (*) 1.005 - 1.030   pH 6.0  5.0 - 8.0   Glucose, UA NEGATIVE  NEGATIVE mg/dL   Hgb urine dipstick MODERATE (*) NEGATIVE   Bilirubin Urine NEGATIVE  NEGATIVE   Ketones, ur NEGATIVE  NEGATIVE mg/dL   Protein, ur NEGATIVE  NEGATIVE mg/dL   Urobilinogen, UA 0.2  0.0 - 1.0 mg/dL   Nitrite NEGATIVE  NEGATIVE   Leukocytes, UA TRACE (*) NEGATIVE  URINE MICROSCOPIC-ADD ON      Result Value Range   Squamous Epithelial / LPF FEW (*) RARE   WBC, UA 0-2  <3 WBC/hpf   Bacteria, UA FEW (*) RARE   Crystals CA OXALATE CRYSTALS (*) NEGATIVE   Urine-Other MUCOUS PRESENT    CBC      Result Value Range   WBC 14.6 (*) 4.0 - 10.5 K/uL   RBC 4.08  3.87 - 5.11 MIL/uL   Hemoglobin 11.0 (*) 12.0 - 15.0 g/dL   HCT 16.1 (*) 09.6 - 04.5 %   MCV 82.1  78.0 - 100.0 fL   MCH 27.0  26.0 - 34.0 pg   MCHC 32.8  30.0 - 36.0 g/dL   RDW 40.9  81.1 - 91.4 %   Platelets 283  150 - 400 K/uL  POCT PREGNANCY, URINE      Result Value Range   Preg Test, Ur NEGATIVE  NEGATIVE   US Transvaginal Non-ob  11/30/2012   *RADIOLOGY REPORT*  Clinical Data: Right lower quadrant pain. LMP 11/22/2012  TRANSABDOMINAL AND TRANSVAGINAL ULTRASOUND OF PELVIS Technique:  Both transabdominal and transvaginal ultrasound examinations of the pelvis were performed. Transabdominal technique was  performed for global imaging of the pelvis including uterus, ovaries, adnexal regions, and pelvic cul-de-sac.  It was  necessary to proceed with endovaginal exam following the transabdominal exam to visualize the myometrium, endometrium and adnexa.  Comparison:  None  Findings:  Uterus: Is anteverted and anteflexed and demonstrates a sagittal length of 10.9 cm, depth of 6.0 cm and width of 6.1 cm.  A focal mural fibroid is identified in the right lateral mid body measuring 2.1 x 1.7 x 1.9 cm.  Two prior C-section incision sites are seen in the lower uterine segment.  No other focal mural abnormalities are identified.  Endometrium: Appears homogeneously echogenic with a width of 7 mm. No areas of focal thickening or heterogeneity are seen. Some linear vascularity is seen within the endometrial lining with color Doppler assessment and this is of questionable significance given lack of visualization of a focal abnormality such as a polyp.  Right ovary:  Has a normal appearance measuring 2.7 x 1.5 x 2.2 cm  Left ovary: As a normal appearance measuring 3.3 x 1.4 x 2.2 cm  Other findings: No pelvic fluid or separate adnexal masses are seen.  IMPRESSION: Focal fibroid with size and location as noted above.  Some linear vascularity extending into the endometrial canal is of questionable significance given the lack of a visualized focal endometrial abnormality.  Normal ovaries.   Original Report Authenticated By: Rhodia Albright, M.D.   US Pelvis Complete  11/30/2012   *RADIOLOGY REPORT*  Clinical Data: Right lower quadrant pain. LMP 11/22/2012  TRANSABDOMINAL AND TRANSVAGINAL ULTRASOUND OF PELVIS Technique:  Both transabdominal and transvaginal ultrasound examinations of the pelvis were performed. Transabdominal technique was performed for global imaging of the pelvis including uterus, ovaries, adnexal regions, and pelvic cul-de-sac.  It was necessary to proceed with endovaginal exam following the transabdominal exam to  visualize the myometrium, endometrium and adnexa.  Comparison:  None  Findings:  Uterus: Is anteverted and anteflexed and demonstrates a sagittal length of 10.9 cm, depth of 6.0 cm and width of 6.1 cm.  A focal mural fibroid is identified in the right lateral mid body measuring 2.1 x 1.7 x 1.9 cm.  Two prior C-section incision sites are seen in the lower uterine segment.  No other focal mural abnormalities are identified.  Endometrium: Appears homogeneously echogenic with a width of 7 mm. No areas of focal thickening or heterogeneity are seen. Some linear vascularity is seen within the endometrial lining with color Doppler assessment and this is of questionable significance given lack of visualization of a focal abnormality such as a polyp.  Right ovary:  Has a normal appearance measuring 2.7 x 1.5 x 2.2 cm  Left ovary: As a normal appearance measuring 3.3 x 1.4 x 2.2 cm  Other findings: No pelvic fluid or separate adnexal masses are seen.  IMPRESSION: Focal fibroid with size and location as noted above.  Some linear vascularity extending into the endometrial canal is of questionable significance given the lack of a visualized focal endometrial abnormality.  Normal ovaries.   Original Report Authenticated By: Rhodia Albright, M.D.   Ct Abdomen Pelvis W Contrast  11/30/2012   *RADIOLOGY REPORT*  Clinical Data: Right lower quadrant and pelvic pain for 2 days, history hypertension, uterine fibroid  CT ABDOMEN AND PELVIS WITH CONTRAST  Technique:  Multidetector CT imaging of the abdomen and pelvis was performed following the standard protocol during bolus administration of intravenous contrast. Sagittal and coronal MPR images reconstructed from axial data set.  Contrast: OMNIPAQUE IOHEXOL 300 MG/ML  SOLN, 50mL OMNIPAQUE IOHEXOL 300 MG/ML  SOLN  Comparison: None  Findings:  Minimal atelectasis left lung base. Gallbladder surgically absent. Liver, spleen, pancreas, kidneys, and adrenal glands normal appearance.  Normal appendix. Small umbilical hernia containing fat. The presence of mild surrounding edema and minimal fluid within the hernia sac raises question of incarceration.  Unremarkable bladder. A small amount of high attenuation is seen within ureters bilaterally likely related to excrete contrast material, imaging appears late versus contrast injection, unable to completely exclude tiny nonobstructing residual calculi in this setting. No ureteral dilatation or hydronephrosis. Unremarkable bladder, uterus and adnexae. Stomach and bowel loops normal appearance for technique. No mass, adenopathy, free fluid, or inflammatory process. No acute osseous findings.  IMPRESSION: Small umbilical hernia containing fat, with mild infiltrative changes surrounding the hernia sac and minimal fluid within the hernia sac raising question of incarceration; recommend correlation for symptoms at this site. Normal appendix. No other definite intra-abdominal or intrapelvic abnormalities as above.   Original Report Authenticated By: Ulyses Southward, M.D.   US Renal  11/30/2012   *RADIOLOGY REPORT*  Clinical Data: Right lower quadrant and right leg pain for 2 days  RENAL/URINARY TRACT ULTRASOUND COMPLETE  Comparison:  Abdominal ultrasound on 09-29-2012  Findings:  Right Kidney:  The right kidney demonstrates a sagittal length of 11.3 cm.  No focal parenchymal abnormalities are seen but overall parenchymal evaluation is somewhat compromised by decreased resolution due to the patient body habitus. No signs of hydronephrosis are evident.  Left Kidney:  The left kidney demonstrates a sagittal length of 11.8 cm.  No focal parenchymal abnormality is identified but overall resolution is compromised by patient body habitus.  No hydronephrosis is seen.  Bladder:  Has a normal filled appearance of bilateral ureteral jets are evident  IMPRESSION: Somewhat compromised parenchymal evaluation due to patient body habitus.  No definite abnormality seen.    Original Report Authenticated By: Rhodia Albright, M.D.   Dg Abd Acute W/chest  11/20/2012   *RADIOLOGY REPORT*  Clinical Data: Upper abdominal pain  ACUTE ABDOMEN SERIES (ABDOMEN 2 VIEW & CHEST 1 VIEW)  Comparison: 10/16/2012  Findings: The lungs clear.  Heart size normal.  No effusion. Vascular clips in the right upper abdomen.  No free air.  Normal bowel gas pattern.  Regional bones unremarkable.  No abnormal abdominal calcifications.  IMPRESSION: 1.  Normal bowel gas pattern. 2.  No free air. 3.  No acute cardiopulmonary disease.   Original Report Authenticated By: D. Andria Rhein, MD     1. Abdominal pain, acute, right lower quadrant     MDM  5:19 PM [facility], here with continued RLQ pain, concerning for possible appendicitis. CT obtained which showed normal appendix. Possible small periumbilical hernia with mild infiltrative changes and minimal hernia sack fluid. i reassessed pt. She had not tenderness or palpable hernia over her umbilicus, however, it is difficult to assess given her body habitus. Her pain remains RLQ. She is having normal bowel movements, doubt this is a source of her pain. She also spiked a fever of 100.1 in ED. i Question possibility of PID. Will d/c home on doxycyline, as well as norco for pain. Close follow up with GYN and surgery.   Filed Vitals:   11/30/12 1344 11/30/12 1409 11/30/12 1501 11/30/12 1711  BP:  151/87 161/94 138/82  Pulse:  87 86 92  Temp:  98.7 F (37.1 C) 100.1 F (37.8 C) 99.4 F (37.4 C)  TempSrc:  Oral Oral Oral  Resp:  20 16 20   Height: 5\' 5"  (  1.651 m)     Weight:      SpO2:  100% 100% 100%     Lottie Mussel, PA-C 11/30/12 1723

## 2012-11-30 NOTE — ED Notes (Signed)
Pt transported [facility] via [facility], reports pelvic pain.  Per [facility] pt has had abx for possible PID.  Pt was medicated for pain as ordered.  Pt transported here to have CT done.

## 2012-11-30 NOTE — MAU Provider Note (Signed)
History     CSN: [id number]  Arrival date and time: 11/30/12 5784   First Provider Initiated Contact with Patient 11/30/12 0920      Chief Complaint  Patient presents with  . Pelvic Pain   HPI [name] is a 27 y.o. [phone] who presents with onset of intermittent crampy abdominal pain on her right side starting 2 days ago which was well controlled with ibuprofen. Last night, this progressed to sharp RLQ abdominal pain which was relieved with Tylenol. This morning, she awoke to 10/10 pain which prompted her visit to the [facility] MAU. She states that "the pain is similar to when I had PID but worse." She does endorse small amounts of yellowish discharge from her vagina, but no no bleeding. She is currently sexually active with 3 partners with which she occasionally uses protection. Her LMP was 11/22/12. She has undergone tubal ligation. Pregnancy test here is negative. She denies N/V, diarrhea, fever, chills, or change in appetite. She states that her right side hurts when she urinates but that there is no burning at her urethra, frequency, or urgency.   OB History   Grav Para Term Preterm Abortions TAB SAB Ect Mult Living   3 3 3       3       Past Medical History  Diagnosis Date  . Bronchitis   . Hypertension   . Acid reflux   . Pregnancy induced hypertension   . Urinary tract infection   . History of PID   . Abnormal Pap smear     colposcopy, mild dysplasia, HPV  . Gonorrhea   . Chlamydia   . Trichomonas   . Depression     on meds for bipolar  . Anxiety     on meds- stable  . Obesity   . Sleep apnea     hasnt uses CPAP x 4 years    Past Surgical History  Procedure Laterality Date  . Wisdom tooth extraction    . Foot surgery    . Tubal ligation    . Cesarean section  05/02/03,05/04/08,10/23/10    x3  . Cholecystectomy N/A 10/07/2012    Procedure: LAPAROSCOPIC CHOLECYSTECTOMY WITH INTRAOPERATIVE CHOLANGIOGRAM;  Surgeon: Wilmon Arms. Corliss Skains, MD;   Location: [facility];  Service: General;  Laterality: N/A;    Family History  Problem Relation Age of Onset  . Other Neg Hx   . Hypertension Mother   . Heart disease Father   . Stroke Father     History  Substance Use Topics  . Smoking status: Current Every Day Smoker -- 0.25 packs/day for 1 years    Types: Cigarettes  . Smokeless tobacco: Never Used  . Alcohol Use: Yes     Comment: socially    Allergies:  Allergies  Allergen Reactions  . Nsaids Hives, Itching and Rash  . Tylenol (Acetaminophen) Hives, Itching and Rash    Prescriptions prior to admission  Medication Sig Dispense Refill  . Lurasidone HCl (LATUDA) 20 MG TABS Take 20 mg by mouth at bedtime.      . ondansetron (ZOFRAN) 4 MG tablet Take 1 tablet (4 mg total) by mouth every 6 (six) hours.  12 tablet  0  . sertraline (ZOLOFT) 50 MG tablet Take 50 mg by mouth daily with breakfast.         Review of Systems  Constitutional: Negative for fever, chills and weight loss.  Eyes: Negative for blurred vision.  Cardiovascular: Negative for chest pain  and palpitations.  Gastrointestinal: Positive for abdominal pain. Negative for heartburn, nausea, vomiting, diarrhea, constipation and blood in stool.  Genitourinary: Negative for dysuria, urgency, frequency, hematuria and flank pain.  Musculoskeletal: Negative for myalgias.  Skin: Negative for itching and rash.  Neurological: Negative for dizziness and headaches.   Physical Exam   Blood pressure 154/91, pulse 83, temperature 98.8 F (37.1 C), temperature source Oral, resp. rate 18, weight 136.986 kg (302 lb), last menstrual period 11/22/2012.  Physical Exam  Constitutional: She appears well-developed and well-nourished. She appears distressed (In obvious discomfort, tearful).  HENT:  Head: Normocephalic and atraumatic.  Neck: Normal range of motion.  Cardiovascular: Normal rate, regular rhythm, normal heart sounds and intact distal pulses.  Exam reveals no gallop and no  friction rub.   No murmur heard. Respiratory: Effort normal and breath sounds normal.  GI: Soft. Bowel sounds are normal. She exhibits no distension and no mass. There is tenderness (RLQ and suprapubic). There is no rebound and no guarding.  Genitourinary: No labial fusion. There is no rash, tenderness, lesion or injury on the right labia. There is no rash, tenderness, lesion or injury on the left labia. Cervix exhibits discharge (Thin, yellow, malodorous). Cervix exhibits no motion tenderness and no friability. Right adnexum displays no mass, no tenderness and no fullness. Left adnexum displays no mass, no tenderness and no fullness. Vaginal discharge (Thin, yellow, malodorous) found.   Results for orders placed during the hospital encounter of 11/30/12 (from the past 24 hour(s))  URINALYSIS, ROUTINE W REFLEX MICROSCOPIC     Status: Abnormal   Collection Time    11/30/12  8:45 AM      Result Value Range   Color, Urine YELLOW  YELLOW   APPearance CLEAR  CLEAR   Specific Gravity, Urine >1.030 (*) 1.005 - 1.030   pH 6.0  5.0 - 8.0   Glucose, UA NEGATIVE  NEGATIVE mg/dL   Hgb urine dipstick MODERATE (*) NEGATIVE   Bilirubin Urine NEGATIVE  NEGATIVE   Ketones, ur NEGATIVE  NEGATIVE mg/dL   Protein, ur NEGATIVE  NEGATIVE mg/dL   Urobilinogen, UA 0.2  0.0 - 1.0 mg/dL   Nitrite NEGATIVE  NEGATIVE   Leukocytes, UA TRACE (*) NEGATIVE  URINE MICROSCOPIC-ADD ON     Status: Abnormal   Collection Time    11/30/12  8:45 AM      Result Value Range   Squamous Epithelial / LPF FEW (*) RARE   WBC, UA 0-2  <3 WBC/hpf   Bacteria, UA FEW (*) RARE   Crystals CA OXALATE CRYSTALS (*) NEGATIVE   Urine-Other MUCOUS PRESENT    POCT PREGNANCY, URINE     Status: None   Collection Time    11/30/12  8:52 AM      Result Value Range   Preg Test, Ur NEGATIVE  NEGATIVE  WET PREP, GENITAL     Status: Abnormal   Collection Time    11/30/12  9:40 AM      Result Value Range   Yeast Wet Prep HPF POC NONE SEEN  NONE  SEEN   Trich, Wet Prep NONE SEEN  NONE SEEN   Clue Cells Wet Prep HPF POC NONE SEEN  NONE SEEN   WBC, Wet Prep HPF POC MANY (*) NONE SEEN  CBC     Status: Abnormal   Collection Time    11/30/12 10:00 AM      Result Value Range   WBC 14.6 (*) 4.0 - 10.5 K/uL  RBC 4.08  3.87 - 5.11 MIL/uL   Hemoglobin 11.0 (*) 12.0 - 15.0 g/dL   HCT 16.1 (*) 09.6 - 04.5 %   MCV 82.1  78.0 - 100.0 fL   MCH 27.0  26.0 - 34.0 pg   MCHC 32.8  30.0 - 36.0 g/dL   RDW 40.9  81.1 - 91.4 %   Platelets 283  150 - 400 K/uL    US Transvaginal Non-ob  11/30/2012   *RADIOLOGY REPORT*  Clinical Data: Right lower quadrant pain. LMP 11/22/2012  TRANSABDOMINAL AND TRANSVAGINAL ULTRASOUND OF PELVIS Technique:  Both transabdominal and transvaginal ultrasound examinations of the pelvis were performed. Transabdominal technique was performed for global imaging of the pelvis including uterus, ovaries, adnexal regions, and pelvic cul-de-sac.  It was necessary to proceed with endovaginal exam following the transabdominal exam to visualize the myometrium, endometrium and adnexa.  Comparison:  None  Findings:  Uterus: Is anteverted and anteflexed and demonstrates a sagittal length of 10.9 cm, depth of 6.0 cm and width of 6.1 cm.  A focal mural fibroid is identified in the right lateral mid body measuring 2.1 x 1.7 x 1.9 cm.  Two prior C-section incision sites are seen in the lower uterine segment.  No other focal mural abnormalities are identified.  Endometrium: Appears homogeneously echogenic with a width of 7 mm. No areas of focal thickening or heterogeneity are seen. Some linear vascularity is seen within the endometrial lining with color Doppler assessment and this is of questionable significance given lack of visualization of a focal abnormality such as a polyp.  Right ovary:  Has a normal appearance measuring 2.7 x 1.5 x 2.2 cm  Left ovary: As a normal appearance measuring 3.3 x 1.4 x 2.2 cm  Other findings: No pelvic fluid or separate  adnexal masses are seen.  IMPRESSION: Focal fibroid with size and location as noted above.  Some linear vascularity extending into the endometrial canal is of questionable significance given the lack of a visualized focal endometrial abnormality.  Normal ovaries.   Original Report Authenticated By: Rhodia Albright, M.D.   US Pelvis Complete  11/30/2012   *RADIOLOGY REPORT*  Clinical Data: Right lower quadrant pain. LMP 11/22/2012  TRANSABDOMINAL AND TRANSVAGINAL ULTRASOUND OF PELVIS Technique:  Both transabdominal and transvaginal ultrasound examinations of the pelvis were performed. Transabdominal technique was performed for global imaging of the pelvis including uterus, ovaries, adnexal regions, and pelvic cul-de-sac.  It was necessary to proceed with endovaginal exam following the transabdominal exam to visualize the myometrium, endometrium and adnexa.  Comparison:  None  Findings:  Uterus: Is anteverted and anteflexed and demonstrates a sagittal length of 10.9 cm, depth of 6.0 cm and width of 6.1 cm.  A focal mural fibroid is identified in the right lateral mid body measuring 2.1 x 1.7 x 1.9 cm.  Two prior C-section incision sites are seen in the lower uterine segment.  No other focal mural abnormalities are identified.  Endometrium: Appears homogeneously echogenic with a width of 7 mm. No areas of focal thickening or heterogeneity are seen. Some linear vascularity is seen within the endometrial lining with color Doppler assessment and this is of questionable significance given lack of visualization of a focal abnormality such as a polyp.  Right ovary:  Has a normal appearance measuring 2.7 x 1.5 x 2.2 cm  Left ovary: As a normal appearance measuring 3.3 x 1.4 x 2.2 cm  Other findings: No pelvic fluid or separate adnexal masses are seen.  IMPRESSION: Focal  fibroid with size and location as noted above.  Some linear vascularity extending into the endometrial canal is of questionable significance given the lack  of a visualized focal endometrial abnormality.  Normal ovaries.   Original Report Authenticated By: Rhodia Albright, M.D.   US Renal  11/30/2012   *RADIOLOGY REPORT*  Clinical Data: Right lower quadrant and right leg pain for 2 days  RENAL/URINARY TRACT ULTRASOUND COMPLETE  Comparison:  Abdominal ultrasound on 09-29-2012  Findings:  Right Kidney:  The right kidney demonstrates a sagittal length of 11.3 cm.  No focal parenchymal abnormalities are seen but overall parenchymal evaluation is somewhat compromised by decreased resolution due to the patient body habitus. No signs of hydronephrosis are evident.  Left Kidney:  The left kidney demonstrates a sagittal length of 11.8 cm.  No focal parenchymal abnormality is identified but overall resolution is compromised by patient body habitus.  No hydronephrosis is seen.  Bladder:  Has a normal filled appearance of bilateral ureteral jets are evident  IMPRESSION: Somewhat compromised parenchymal evaluation due to patient body habitus.  No definite abnormality seen.   Original Report Authenticated By: Rhodia Albright, M.D.   Patient Vitals for the past 24 hrs:  BP Temp Temp src Pulse Resp Height Weight  11/30/12 1344 - - - - - 5\' 5"  (1.651 m) -  11/30/12 1226 152/97 mmHg - - 84 - - -  11/30/12 0907 154/91 mmHg - - 83 - - -  11/30/12 0840 188/115 mmHg 98.8 F (37.1 C) Oral 85 18 - 302 lb (136.986 kg)    MAU Course  Procedures  MDM GC/Chlamydia pending U/S: 2.0x1.0x1.0 cm right sided uterine fibroid CBC: WBC 14.6 Elevated BP's on arrival, will continue with serial BP's in MAU. Patient has PCP and was encouraged to follow up soon.  250 mg Rocephin IM and 1 G Zithromax PO in MAU for possible PID Assessment and Plan  A: RLQ Pain Uterine fibroid Elevated BP  P: Treat for possible GC/Chlamydia in MAU Transfer to [facility] ED for suspicion of appendicitis or other acute process based on elevated WBC and otherwise non-specific diagnostic findings.  Arther Abbott 11/30/2012, 9:24 AM   I have seen and evaluated the patient with the PA student. I agree with the assessment and plan as written above.   Freddi Starr, PA-C 11/30/2012 2:10 PM

## 2012-11-30 NOTE — MAU Provider Note (Signed)
Attestation of Attending Supervision of Advanced Practitioner (PA/CNM/NP): Evaluation and management procedures were performed by the Advanced Practitioner under my supervision and collaboration.  I have reviewed the Advanced Practitioner's note and chart, and I agree with the management and plan.  Ranbir Chew, MD, FACOG Attending Obstetrician & Gynecologist Faculty Practice, [facility]  

## 2012-11-30 NOTE — ED Notes (Signed)
ZOX:[id number]<AV> Expected date:<BR> Expected time:<BR> Means of arrival:<BR> Comments:<BR> [name], trans from MAU, RLQ pain

## 2012-11-30 NOTE — MAU Note (Signed)
Sharp pains in rt lower pelvis, started a couple days ago, comes and goes.   Feels kind of like when had PID.

## 2012-12-01 ENCOUNTER — Observation Stay (HOSPITAL_COMMUNITY)
Admission: EM | Admit: 2012-12-01 | Discharge: 2012-12-04 | Disposition: A | Discharge status: Home / Self Care | Payer: Medicaid Other | Attending: General Surgery | Admitting: General Surgery

## 2012-12-01 ENCOUNTER — Encounter (HOSPITAL_COMMUNITY): Payer: Self-pay | Admitting: Emergency Medicine

## 2012-12-01 DIAGNOSIS — G4730 Sleep apnea, unspecified: Secondary | ICD-10-CM | POA: Insufficient documentation

## 2012-12-01 DIAGNOSIS — R109 Unspecified abdominal pain: Secondary | ICD-10-CM | POA: Insufficient documentation

## 2012-12-01 DIAGNOSIS — Z9089 Acquired absence of other organs: Secondary | ICD-10-CM | POA: Insufficient documentation

## 2012-12-01 DIAGNOSIS — I10 Essential (primary) hypertension: Secondary | ICD-10-CM | POA: Insufficient documentation

## 2012-12-01 DIAGNOSIS — K219 Gastro-esophageal reflux disease without esophagitis: Secondary | ICD-10-CM | POA: Insufficient documentation

## 2012-12-01 DIAGNOSIS — R42 Dizziness and giddiness: Secondary | ICD-10-CM | POA: Insufficient documentation

## 2012-12-01 DIAGNOSIS — K430 Incisional hernia with obstruction, without gangrene: Principal | ICD-10-CM | POA: Insufficient documentation

## 2012-12-01 DIAGNOSIS — Z79899 Other long term (current) drug therapy: Secondary | ICD-10-CM | POA: Insufficient documentation

## 2012-12-01 DIAGNOSIS — K429 Umbilical hernia without obstruction or gangrene: Secondary | ICD-10-CM

## 2012-12-01 LAB — GC/CHLAMYDIA PROBE AMP
CT Probe RNA: NEGATIVE
GC Probe RNA: POSITIVE — AB

## 2012-12-01 MED ORDER — HYDROMORPHONE HCL PF 1 MG/ML IJ SOLN
1.0000 mg | Freq: Once | INTRAMUSCULAR | Status: AC
  Administered 2012-12-02: 1 mg via INTRAMUSCULAR
  Filled 2012-12-01: qty 1

## 2012-12-01 MED ORDER — ONDANSETRON 8 MG PO TBDP
8.0000 mg | ORAL_TABLET | Freq: Once | ORAL | Status: AC
  Administered 2012-12-02: 8 mg via ORAL
  Filled 2012-12-01: qty 1

## 2012-12-01 NOTE — ED Provider Notes (Signed)
Medical screening examination/treatment/procedure(s) were performed by non-physician practitioner and as supervising physician I was immediately available for consultation/collaboration.  Flint Melter, MD 12/01/12 1340

## 2012-12-01 NOTE — ED Provider Notes (Signed)
History    CSN: [id number] Arrival date & time 12/01/12  2203  First MD Initiated Contact with Patient 12/01/12 2327     Chief Complaint  Patient presents with  . Abdominal Pain   (Consider location/radiation/quality/duration/timing/severity/associated sxs/prior Treatment) HPI History provided by pt and prior chart.  Pt c/o severe RLQ pain since yesterday am.  Intermittent, non-radiating, aggravating by urinating, no improvement w/ norco and associated w/ N/V and vaginal discharge.  Per prior chart, pt evaluated thoroughly for same at [facility] and here at [facility] yesterday.  Exam was sig for thin, yellow, malodorous cervical discharge but no tenderness on bimanual.  Had a transvaginal US that was non-acute and CT abd/pelvis that showed umbilical hernia.  Wet prep positive for gonorrhea.  Pt has been treated w/ zithromax and rocephin and prescribed doxy, which she has been compliant with.  Past Medical History  Diagnosis Date  . Bronchitis   . Hypertension   . Acid reflux   . Pregnancy induced hypertension   . Urinary tract infection   . History of PID   . Abnormal Pap smear     colposcopy, mild dysplasia, HPV  . Gonorrhea   . Chlamydia   . Trichomonas   . Depression     on meds for bipolar  . Anxiety     on meds- stable  . Obesity   . Sleep apnea     hasnt uses CPAP x 4 years  . Fibroid    Past Surgical History  Procedure Laterality Date  . Wisdom tooth extraction    . Foot surgery    . Tubal ligation    . Cesarean section  05/02/03,05/04/08,10/23/10    x3  . Cholecystectomy N/A 10/07/2012    Procedure: LAPAROSCOPIC CHOLECYSTECTOMY WITH INTRAOPERATIVE CHOLANGIOGRAM;  Surgeon: Wilmon Arms. Corliss Skains, MD;  Location: [facility];  Service: General;  Laterality: N/A;   Family History  Problem Relation Age of Onset  . Other Neg Hx   . Hypertension Mother   . Heart disease Father   . Stroke Father    History  Substance Use Topics  . Smoking status: Current Every Day Smoker --  0.25 packs/day for 1 years    Types: Cigarettes  . Smokeless tobacco: Never Used  . Alcohol Use: Yes     Comment: socially   OB History   Grav Para Term Preterm Abortions TAB SAB Ect Mult Living   3 3 3       3      Review of Systems  All other systems reviewed and are negative.    Allergies  Nsaids and Tylenol  Home Medications   Current Outpatient Rx  Name  Route  Sig  Dispense  Refill  . doxycycline (VIBRAMYCIN) 100 MG capsule   Oral   Take 1 capsule (100 mg total) by mouth 2 (two) times daily.   20 capsule   0   . HYDROcodone-acetaminophen (NORCO) 5-325 MG per tablet   Oral   Take 1 tablet by mouth every 6 (six) hours as needed for pain.   20 tablet   0   . Lurasidone HCl (LATUDA) 20 MG TABS   Oral   Take 20 mg by mouth at bedtime.         . sertraline (ZOLOFT) 50 MG tablet   Oral   Take 50 mg by mouth daily with breakfast.           BP 156/103  Pulse 82  Temp(Src) 99 F (37.2  C) (Oral)  Resp 16  Ht 5\' 5"  (1.651 m)  Wt 300 lb (136.079 kg)  BMI 49.92 kg/m2  SpO2 99%  LMP 11/22/2012 Physical Exam  Nursing note and vitals reviewed. Constitutional: She is oriented to person, place, and time. She appears well-developed and well-nourished. No distress.  HENT:  Head: Normocephalic and atraumatic.  Eyes:  Normal appearance  Neck: Normal range of motion.  Cardiovascular: Normal rate and regular rhythm.   Pulmonary/Chest: Effort normal and breath sounds normal. No respiratory distress.  Abdominal: Soft. Bowel sounds are normal. She exhibits no distension and no mass. There is no rebound and no guarding.  Obese.  Diffuse tenderness of lower abd and suprapubic region.  Genitourinary:  No CVA tenderness  Musculoskeletal: Normal range of motion.  Neurological: She is alert and oriented to person, place, and time.  Skin: Skin is warm and dry. No rash noted.  Psychiatric: She has a normal mood and affect. Her behavior is normal.    ED Course   Procedures (including critical care time) Labs Reviewed  URINALYSIS, ROUTINE W REFLEX MICROSCOPIC - Abnormal; Notable for the following:    APPearance CLOUDY (*)    Specific Gravity, Urine 1.033 (*)    Hgb urine dipstick MODERATE (*)    Bilirubin Urine SMALL (*)    Protein, ur 100 (*)    Leukocytes, UA MODERATE (*)    All other components within normal limits  URINE MICROSCOPIC-ADD ON - Abnormal; Notable for the following:    Squamous Epithelial / LPF MANY (*)    Bacteria, UA FEW (*)    Crystals CA OXALATE CRYSTALS (*)    All other components within normal limits  URINE CULTURE   US Transvaginal Non-ob  11/30/2012   *RADIOLOGY REPORT*  Clinical Data: Right lower quadrant pain. LMP 11/22/2012  TRANSABDOMINAL AND TRANSVAGINAL ULTRASOUND OF PELVIS Technique:  Both transabdominal and transvaginal ultrasound examinations of the pelvis were performed. Transabdominal technique was performed for global imaging of the pelvis including uterus, ovaries, adnexal regions, and pelvic cul-de-sac.  It was necessary to proceed with endovaginal exam following the transabdominal exam to visualize the myometrium, endometrium and adnexa.  Comparison:  None  Findings:  Uterus: Is anteverted and anteflexed and demonstrates a sagittal length of 10.9 cm, depth of 6.0 cm and width of 6.1 cm.  A focal mural fibroid is identified in the right lateral mid body measuring 2.1 x 1.7 x 1.9 cm.  Two prior C-section incision sites are seen in the lower uterine segment.  No other focal mural abnormalities are identified.  Endometrium: Appears homogeneously echogenic with a width of 7 mm. No areas of focal thickening or heterogeneity are seen. Some linear vascularity is seen within the endometrial lining with color Doppler assessment and this is of questionable significance given lack of visualization of a focal abnormality such as a polyp.  Right ovary:  Has a normal appearance measuring 2.7 x 1.5 x 2.2 cm  Left ovary: As a normal  appearance measuring 3.3 x 1.4 x 2.2 cm  Other findings: No pelvic fluid or separate adnexal masses are seen.  IMPRESSION: Focal fibroid with size and location as noted above.  Some linear vascularity extending into the endometrial canal is of questionable significance given the lack of a visualized focal endometrial abnormality.  Normal ovaries.   Original Report Authenticated By: Rhodia Albright, M.D.   US Pelvis Complete  11/30/2012   *RADIOLOGY REPORT*  Clinical Data: Right lower quadrant pain. LMP 11/22/2012  TRANSABDOMINAL AND  TRANSVAGINAL ULTRASOUND OF PELVIS Technique:  Both transabdominal and transvaginal ultrasound examinations of the pelvis were performed. Transabdominal technique was performed for global imaging of the pelvis including uterus, ovaries, adnexal regions, and pelvic cul-de-sac.  It was necessary to proceed with endovaginal exam following the transabdominal exam to visualize the myometrium, endometrium and adnexa.  Comparison:  None  Findings:  Uterus: Is anteverted and anteflexed and demonstrates a sagittal length of 10.9 cm, depth of 6.0 cm and width of 6.1 cm.  A focal mural fibroid is identified in the right lateral mid body measuring 2.1 x 1.7 x 1.9 cm.  Two prior C-section incision sites are seen in the lower uterine segment.  No other focal mural abnormalities are identified.  Endometrium: Appears homogeneously echogenic with a width of 7 mm. No areas of focal thickening or heterogeneity are seen. Some linear vascularity is seen within the endometrial lining with color Doppler assessment and this is of questionable significance given lack of visualization of a focal abnormality such as a polyp.  Right ovary:  Has a normal appearance measuring 2.7 x 1.5 x 2.2 cm  Left ovary: As a normal appearance measuring 3.3 x 1.4 x 2.2 cm  Other findings: No pelvic fluid or separate adnexal masses are seen.  IMPRESSION: Focal fibroid with size and location as noted above.  Some linear  vascularity extending into the endometrial canal is of questionable significance given the lack of a visualized focal endometrial abnormality.  Normal ovaries.   Original Report Authenticated By: Rhodia Albright, M.D.   Ct Abdomen Pelvis W Contrast  11/30/2012   *RADIOLOGY REPORT*  Clinical Data: Right lower quadrant and pelvic pain for 2 days, history hypertension, uterine fibroid  CT ABDOMEN AND PELVIS WITH CONTRAST  Technique:  Multidetector CT imaging of the abdomen and pelvis was performed following the standard protocol during bolus administration of intravenous contrast. Sagittal and coronal MPR images reconstructed from axial data set.  Contrast: OMNIPAQUE IOHEXOL 300 MG/ML  SOLN, 50mL OMNIPAQUE IOHEXOL 300 MG/ML  SOLN  Comparison: None  Findings: Minimal atelectasis left lung base. Gallbladder surgically absent. Liver, spleen, pancreas, kidneys, and adrenal glands normal appearance. Normal appendix. Small umbilical hernia containing fat. The presence of mild surrounding edema and minimal fluid within the hernia sac raises question of incarceration.  Unremarkable bladder. A small amount of high attenuation is seen within ureters bilaterally likely related to excrete contrast material, imaging appears late versus contrast injection, unable to completely exclude tiny nonobstructing residual calculi in this setting. No ureteral dilatation or hydronephrosis. Unremarkable bladder, uterus and adnexae. Stomach and bowel loops normal appearance for technique. No mass, adenopathy, free fluid, or inflammatory process. No acute osseous findings.  IMPRESSION: Small umbilical hernia containing fat, with mild infiltrative changes surrounding the hernia sac and minimal fluid within the hernia sac raising question of incarceration; recommend correlation for symptoms at this site. Normal appendix. No other definite intra-abdominal or intrapelvic abnormalities as above.   Original Report Authenticated By: Ulyses Southward,  M.D.   US Renal  11/30/2012   *RADIOLOGY REPORT*  Clinical Data: Right lower quadrant and right leg pain for 2 days  RENAL/URINARY TRACT ULTRASOUND COMPLETE  Comparison:  Abdominal ultrasound on 09-29-2012  Findings:  Right Kidney:  The right kidney demonstrates a sagittal length of 11.3 cm.  No focal parenchymal abnormalities are seen but overall parenchymal evaluation is somewhat compromised by decreased resolution due to the patient body habitus. No signs of hydronephrosis are evident.  Left Kidney:  The  left kidney demonstrates a sagittal length of 11.8 cm.  No focal parenchymal abnormality is identified but overall resolution is compromised by patient body habitus.  No hydronephrosis is seen.  Bladder:  Has a normal filled appearance of bilateral ureteral jets are evident  IMPRESSION: Somewhat compromised parenchymal evaluation due to patient body habitus.  No definite abnormality seen.   Original Report Authenticated By: Rhodia Albright, M.D.   1. Umbilical hernia     MDM  27yo F presents for third time in 2 days w/ abdominal pain.  Diagnosed and treated for PID; cervical discharge, wet prep pos for gonorrhea, non-acute transvaginal US and CT abd/pelvis showed umbilical hernia (findings concerning for incarceration).  Has had no relief of pain w/ norco and has been compliant w/ doxy.  On exam, afebrile, VS w/in nml range, non-toxic appearing, abd soft/non-distended, diffuse lower abd ttp, worst at umbilicus.  Repeat U/A pending.  Pt to receive IM dilaudid and zofran ODT.  12:00 AM   Pt had improvement in pain and on re-examination, tenderness continues to be worst at umbilicus.  Based on findings on pelvic exam at [facility] yesterday and current abd exam, I am concerned that patient's pain is not secondary to PID and could be related to the hernia.  Consulted Dr. Johna Sheriff who has examined pt and decided to admit.  5:13 AM   Otilio Miu, PA-C 12/02/12 [phone]

## 2012-12-01 NOTE — ED Notes (Signed)
Pt states she has had ABD pain for several days. She was treated here yesterday but the pain is still occurring.

## 2012-12-02 ENCOUNTER — Observation Stay (HOSPITAL_COMMUNITY): Payer: Medicaid Other | Admitting: Anesthesiology

## 2012-12-02 ENCOUNTER — Observation Stay (HOSPITAL_COMMUNITY): Payer: Medicaid Other

## 2012-12-02 ENCOUNTER — Encounter (HOSPITAL_COMMUNITY): Payer: Self-pay | Admitting: Anesthesiology

## 2012-12-02 ENCOUNTER — Encounter (HOSPITAL_COMMUNITY)
Admission: EM | Disposition: A | Discharge status: Home / Self Care | Payer: Self-pay | Source: Home / Self Care | Attending: Emergency Medicine

## 2012-12-02 ENCOUNTER — Encounter (HOSPITAL_COMMUNITY): Payer: Self-pay | Admitting: *Deleted

## 2012-12-02 DIAGNOSIS — K430 Incisional hernia with obstruction, without gangrene: Secondary | ICD-10-CM

## 2012-12-02 HISTORY — PX: INSERTION OF MESH: SHX5868

## 2012-12-02 HISTORY — PX: UMBILICAL HERNIA REPAIR: SHX196

## 2012-12-02 LAB — URINALYSIS, ROUTINE W REFLEX MICROSCOPIC
Glucose, UA: NEGATIVE mg/dL
Ketones, ur: NEGATIVE mg/dL
Nitrite: NEGATIVE
Protein, ur: 100 mg/dL — AB
Specific Gravity, Urine: 1.033 — ABNORMAL HIGH (ref 1.005–1.030)
Urobilinogen, UA: 1 mg/dL (ref 0.0–1.0)
pH: 6 (ref 5.0–8.0)

## 2012-12-02 LAB — URINE MICROSCOPIC-ADD ON

## 2012-12-02 LAB — SURGICAL PCR SCREEN
MRSA, PCR: NEGATIVE
Staphylococcus aureus: NEGATIVE

## 2012-12-02 SURGERY — REPAIR, HERNIA, UMBILICAL, LAPAROSCOPIC
Anesthesia: General | Site: Abdomen | Wound class: Clean

## 2012-12-02 MED ORDER — KCL IN DEXTROSE-NACL 20-5-0.9 MEQ/L-%-% IV SOLN
INTRAVENOUS | Status: DC
  Administered 2012-12-02 – 2012-12-04 (×4): via INTRAVENOUS
  Filled 2012-12-02 (×7): qty 1000

## 2012-12-02 MED ORDER — LIP MEDEX EX OINT
1.0000 "application " | TOPICAL_OINTMENT | Freq: Two times a day (BID) | CUTANEOUS | Status: DC
  Administered 2012-12-02 – 2012-12-04 (×4): 1 via TOPICAL
  Filled 2012-12-02: qty 7

## 2012-12-02 MED ORDER — MAGNESIUM HYDROXIDE 400 MG/5ML PO SUSP: 30.0000 mL | Freq: Two times a day (BID) | ORAL | Status: DC | PRN

## 2012-12-02 MED ORDER — 0.9 % SODIUM CHLORIDE (POUR BTL) OPTIME
TOPICAL | Status: DC | PRN
  Administered 2012-12-02: 1000 mL

## 2012-12-02 MED ORDER — PROMETHAZINE HCL 25 MG/ML IJ SOLN: 6.2500 mg | INTRAMUSCULAR | Status: DC | PRN

## 2012-12-02 MED ORDER — LURASIDONE HCL 20 MG PO TABS
20.0000 mg | ORAL_TABLET | Freq: Every day | ORAL | Status: DC
  Administered 2012-12-03 (×2): 20 mg via ORAL
  Filled 2012-12-02 (×3): qty 1

## 2012-12-02 MED ORDER — BUPIVACAINE 0.25 % ON-Q PUMP DUAL CATH 300 ML
300.0000 mL | INJECTION | Status: DC
  Filled 2012-12-02: qty 300

## 2012-12-02 MED ORDER — HEPARIN SODIUM (PORCINE) 5000 UNIT/ML IJ SOLN
5000.0000 [IU] | Freq: Three times a day (TID) | INTRAMUSCULAR | Status: DC
  Filled 2012-12-02 (×4): qty 1

## 2012-12-02 MED ORDER — ALUM & MAG HYDROXIDE-SIMETH 200-200-20 MG/5ML PO SUSP
30.0000 mL | Freq: Four times a day (QID) | ORAL | Status: DC | PRN
  Administered 2012-12-03: 30 mL via ORAL
  Filled 2012-12-02: qty 30

## 2012-12-02 MED ORDER — PSYLLIUM 95 % PO PACK
1.0000 | PACK | Freq: Two times a day (BID) | ORAL | Status: DC
  Administered 2012-12-03 – 2012-12-04 (×4): 1 via ORAL
  Filled 2012-12-02 (×5): qty 1

## 2012-12-02 MED ORDER — KETOROLAC TROMETHAMINE 30 MG/ML IJ SOLN
INTRAMUSCULAR | Status: DC | PRN
  Administered 2012-12-02: 30 mg via INTRAVENOUS

## 2012-12-02 MED ORDER — SODIUM CHLORIDE 0.9 % IJ SOLN: 3.0000 mL | INTRAMUSCULAR | Status: DC | PRN

## 2012-12-02 MED ORDER — HYDROMORPHONE HCL PF 1 MG/ML IJ SOLN
1.0000 mg | INTRAMUSCULAR | Status: DC | PRN
  Administered 2012-12-02 – 2012-12-03 (×5): 1 mg via INTRAVENOUS
  Administered 2012-12-03: 2 mg via INTRAVENOUS
  Administered 2012-12-03 (×3): 1 mg via INTRAVENOUS
  Filled 2012-12-02 (×4): qty 1
  Filled 2012-12-02: qty 2
  Filled 2012-12-02 (×4): qty 1

## 2012-12-02 MED ORDER — FENTANYL CITRATE 0.05 MG/ML IJ SOLN
INTRAMUSCULAR | Status: DC | PRN
  Administered 2012-12-02: 25 ug via INTRAVENOUS
  Administered 2012-12-02: 100 ug via INTRAVENOUS
  Administered 2012-12-02: 25 ug via INTRAVENOUS
  Administered 2012-12-02 (×2): 50 ug via INTRAVENOUS

## 2012-12-02 MED ORDER — DEXTROSE 5 % IV SOLN
2.0000 g | Freq: Once | INTRAVENOUS | Status: AC
  Administered 2012-12-02: 2 g via INTRAVENOUS
  Filled 2012-12-02: qty 2

## 2012-12-02 MED ORDER — HYDROMORPHONE HCL PF 1 MG/ML IJ SOLN: 0.2500 mg | INTRAMUSCULAR | Status: DC | PRN

## 2012-12-02 MED ORDER — ONDANSETRON HCL 4 MG/2ML IJ SOLN
INTRAMUSCULAR | Status: DC | PRN
  Administered 2012-12-02: 4 mg via INTRAVENOUS

## 2012-12-02 MED ORDER — SUCCINYLCHOLINE CHLORIDE 20 MG/ML IJ SOLN
INTRAMUSCULAR | Status: DC | PRN
  Administered 2012-12-02: 100 mg via INTRAVENOUS

## 2012-12-02 MED ORDER — ONDANSETRON HCL 4 MG/2ML IJ SOLN: 4.0000 mg | Freq: Four times a day (QID) | INTRAMUSCULAR | Status: DC | PRN

## 2012-12-02 MED ORDER — LACTATED RINGERS IV SOLN
INTRAVENOUS | Status: DC | PRN
  Administered 2012-12-02 (×2): via INTRAVENOUS

## 2012-12-02 MED ORDER — DIPHENHYDRAMINE HCL 50 MG/ML IJ SOLN: 12.5000 mg | Freq: Four times a day (QID) | INTRAMUSCULAR | Status: DC | PRN

## 2012-12-02 MED ORDER — PROMETHAZINE HCL 25 MG/ML IJ SOLN: 12.5000 mg | Freq: Four times a day (QID) | INTRAMUSCULAR | Status: DC | PRN

## 2012-12-02 MED ORDER — SERTRALINE HCL 50 MG PO TABS
50.0000 mg | ORAL_TABLET | Freq: Every day | ORAL | Status: DC
  Administered 2012-12-03 – 2012-12-04 (×2): 50 mg via ORAL
  Filled 2012-12-02 (×3): qty 1

## 2012-12-02 MED ORDER — OXYCODONE HCL 5 MG PO TABS
5.0000 mg | ORAL_TABLET | ORAL | Status: DC | PRN
  Administered 2012-12-03 (×2): 10 mg via ORAL
  Filled 2012-12-02 (×2): qty 2

## 2012-12-02 MED ORDER — LACTATED RINGERS IR SOLN
Status: DC | PRN
  Administered 2012-12-02: 1000 mL

## 2012-12-02 MED ORDER — BUPIVACAINE-EPINEPHRINE 0.25% -1:200000 IJ SOLN
INTRAMUSCULAR | Status: DC | PRN
  Administered 2012-12-02: 100 mL

## 2012-12-02 MED ORDER — BISACODYL 10 MG RE SUPP: 10.0000 mg | Freq: Two times a day (BID) | RECTAL | Status: DC | PRN

## 2012-12-02 MED ORDER — MAGIC MOUTHWASH
15.0000 mL | Freq: Four times a day (QID) | ORAL | Status: DC | PRN
  Filled 2012-12-02: qty 15

## 2012-12-02 MED ORDER — MIDAZOLAM HCL 5 MG/5ML IJ SOLN
INTRAMUSCULAR | Status: DC | PRN
  Administered 2012-12-02: 2 mg via INTRAVENOUS

## 2012-12-02 MED ORDER — SODIUM CHLORIDE 0.9 % IJ SOLN
3.0000 mL | Freq: Two times a day (BID) | INTRAMUSCULAR | Status: DC
  Administered 2012-12-04: 3 mL via INTRAVENOUS

## 2012-12-02 MED ORDER — CISATRACURIUM BESYLATE (PF) 10 MG/5ML IV SOLN
INTRAVENOUS | Status: DC | PRN
  Administered 2012-12-02: 3 mg via INTRAVENOUS
  Administered 2012-12-02: 1 mg via INTRAVENOUS
  Administered 2012-12-02: 8 mg via INTRAVENOUS

## 2012-12-02 MED ORDER — DEXAMETHASONE SODIUM PHOSPHATE 10 MG/ML IJ SOLN
INTRAMUSCULAR | Status: DC | PRN
  Administered 2012-12-02: 10 mg via INTRAVENOUS

## 2012-12-02 MED ORDER — PROPOFOL 10 MG/ML IV BOLUS
INTRAVENOUS | Status: DC | PRN
  Administered 2012-12-02: 200 mg via INTRAVENOUS

## 2012-12-02 MED ORDER — LACTATED RINGERS IV SOLN: INTRAVENOUS | Status: DC

## 2012-12-02 MED ORDER — HEPARIN SODIUM (PORCINE) 5000 UNIT/ML IJ SOLN
5000.0000 [IU] | Freq: Three times a day (TID) | INTRAMUSCULAR | Status: DC
  Administered 2012-12-03 – 2012-12-04 (×4): 5000 [IU] via SUBCUTANEOUS
  Filled 2012-12-02 (×7): qty 1

## 2012-12-02 MED ORDER — GLYCOPYRROLATE 0.2 MG/ML IJ SOLN
INTRAMUSCULAR | Status: DC | PRN
  Administered 2012-12-02: .8 mg via INTRAVENOUS

## 2012-12-02 MED ORDER — NEOSTIGMINE METHYLSULFATE 1 MG/ML IJ SOLN
INTRAMUSCULAR | Status: DC | PRN
  Administered 2012-12-02: 5 mg via INTRAVENOUS

## 2012-12-02 MED ORDER — LACTATED RINGERS IV BOLUS (SEPSIS): 1000.0000 mL | Freq: Three times a day (TID) | INTRAVENOUS | Status: DC | PRN

## 2012-12-02 SURGICAL SUPPLY — 53 items
BINDER ABD UNIV 12 45-62 (WOUND CARE) ×1 IMPLANT
BINDER ABDOMINAL 46IN 62IN (WOUND CARE) ×2
BLADE SURG SZ11 CARB STEEL (BLADE) ×2 IMPLANT
CABLE HIGH FREQUENCY MONO STRZ (ELECTRODE) ×2 IMPLANT
CANISTER SUCTION 2500CC (MISCELLANEOUS) ×2 IMPLANT
CATH KIT ON Q 7.5IN SLV (PAIN MANAGEMENT) ×4 IMPLANT
CLOSURE STERI-STRIP 1/4X4 (GAUZE/BANDAGES/DRESSINGS) ×2 IMPLANT
CLOTH BEACON ORANGE TIMEOUT ST (SAFETY) ×2 IMPLANT
DECANTER SPIKE VIAL GLASS SM (MISCELLANEOUS) ×2 IMPLANT
DERMABOND ADVANCED (GAUZE/BANDAGES/DRESSINGS)
DERMABOND ADVANCED .7 DNX12 (GAUZE/BANDAGES/DRESSINGS) IMPLANT
DEVICE SECURE STRAP 25 ABSORB (INSTRUMENTS) ×2 IMPLANT
DRAPE INCISE IOBAN 66X45 STRL (DRAPES) IMPLANT
DRAPE LAPAROSCOPIC ABDOMINAL (DRAPES) ×2 IMPLANT
DRSG TEGADERM 2-3/8X2-3/4 SM (GAUZE/BANDAGES/DRESSINGS) ×6 IMPLANT
DRSG TEGADERM 4X4.75 (GAUZE/BANDAGES/DRESSINGS) ×2 IMPLANT
ELECT REM PT RETURN 9FT ADLT (ELECTROSURGICAL) ×2
ELECTRODE REM PT RTRN 9FT ADLT (ELECTROSURGICAL) ×1 IMPLANT
GAUZE SPONGE 2X2 8PLY STRL LF (GAUZE/BANDAGES/DRESSINGS) ×1 IMPLANT
GLOVE BIO SURGEON STRL SZ7 (GLOVE) IMPLANT
GLOVE BIOGEL PI IND STRL 7.0 (GLOVE) ×1 IMPLANT
GLOVE BIOGEL PI IND STRL 7.5 (GLOVE) ×2 IMPLANT
GLOVE BIOGEL PI INDICATOR 7.0 (GLOVE) ×1
GLOVE BIOGEL PI INDICATOR 7.5 (GLOVE) ×2
GLOVE ECLIPSE 8.0 STRL XLNG CF (GLOVE) ×2 IMPLANT
GOWN PREVENTION PLUS LG XLONG (DISPOSABLE) ×2 IMPLANT
GOWN PREVENTION PLUS XLARGE (GOWN DISPOSABLE) IMPLANT
GOWN STRL NON-REIN LRG LVL3 (GOWN DISPOSABLE) IMPLANT
GOWN STRL REIN XL XLG (GOWN DISPOSABLE) ×4 IMPLANT
KIT BASIN OR (CUSTOM PROCEDURE TRAY) ×2 IMPLANT
MARKER SKIN DUAL TIP RULER LAB (MISCELLANEOUS) ×2 IMPLANT
MESH VENTRALIGHT ST 6X8 (Mesh Specialty) ×1 IMPLANT
MESH VENTRLGHT ELLIPSE 8X6XMFL (Mesh Specialty) ×1 IMPLANT
NS IRRIG 1000ML POUR BTL (IV SOLUTION) ×2 IMPLANT
PENCIL BUTTON HOLSTER BLD 10FT (ELECTRODE) ×2 IMPLANT
SCISSORS LAP 5X35 DISP (ENDOMECHANICALS) ×2 IMPLANT
SET IRRIG TUBING LAPAROSCOPIC (IRRIGATION / IRRIGATOR) ×2 IMPLANT
SOLUTION ANTI FOG 6CC (MISCELLANEOUS) IMPLANT
SPONGE GAUZE 2X2 STER 10/PKG (GAUZE/BANDAGES/DRESSINGS) ×1
STAPLER VISISTAT 35W (STAPLE) IMPLANT
STRIP CLOSURE SKIN 1/2X4 (GAUZE/BANDAGES/DRESSINGS) ×2 IMPLANT
SUT MNCRL AB 4-0 PS2 18 (SUTURE) ×4 IMPLANT
SUT PROLENE 0 CT 1 CR/8 (SUTURE) IMPLANT
SUT PROLENE 1 CT 1 30 (SUTURE) ×14 IMPLANT
SUT PROLENE 2 0 SH DA (SUTURE) IMPLANT
TACKER 5MM HERNIA 3.5CML NAB (ENDOMECHANICALS) IMPLANT
TOWEL OR 17X26 10 PK STRL BLUE (TOWEL DISPOSABLE) ×2 IMPLANT
TRAY FOLEY CATH 14FRSI W/METER (CATHETERS) ×2 IMPLANT
TRAY LAP CHOLE (CUSTOM PROCEDURE TRAY) ×2 IMPLANT
TROCAR BALLN 12MMX100 BLUNT (TROCAR) ×2 IMPLANT
TROCAR BLADELESS OPT 5 75 (ENDOMECHANICALS) ×4 IMPLANT
TROCAR XCEL NON-BLD 11X100MML (ENDOMECHANICALS) ×2 IMPLANT
TUBING INSUFFLATION 10FT LAP (TUBING) ×2 IMPLANT

## 2012-12-02 NOTE — ED Provider Notes (Signed)
Medical screening examination/treatment/procedure(s) were performed by non-physician practitioner and as supervising physician I was immediately available for consultation/collaboration.   Nelia Shi, MD 12/02/12 [phone]

## 2012-12-02 NOTE — Progress Notes (Signed)
Patient ID: [name], female   DOB: [birth year], 27 y.o.   MRN: [id number]    Subjective: Patient feels ok now with pain meds.    Objective: Vital signs in last 24 hours: Temp:  [97.6 F (36.4 C)-99 F (37.2 C)] 97.6 F (36.4 C) (06/26 0449) Pulse Rate:  [69-82] 72 (06/26 0449) Resp:  [16-20] 20 (06/26 0449) BP: (128-156)/(88-103) 128/88 mmHg (06/26 0449) SpO2:  [99 %-100 %] 100 % (06/26 0449) Weight:  [300 lb (136.079 kg)] 300 lb (136.079 kg) (06/25 2229) Last BM Date: 11/30/12  Intake/Output from previous day:   Intake/Output this shift:    PE: Abd: soft, unable to feel hernia, +BS Heart: regular Lungs: CTAB  Lab Results:   Recent Labs  11/30/12 1000  WBC 14.6*  HGB 11.0*  HCT 33.5*  PLT 283   BMET No results found for this basename: NA, K, CL, CO2, GLUCOSE, BUN, CREATININE, CALCIUM,  in the last 72 hours PT/INR No results found for this basename: LABPROT, INR,  in the last 72 hours CMP     Component Value Date/Time   NA 137 11/20/2012 2025   K 3.3* 11/20/2012 2025   CL 105 11/20/2012 2025   CO2 26 11/20/2012 2025   GLUCOSE 97 11/20/2012 2025   BUN 10 11/20/2012 2025   CREATININE 0.61 11/20/2012 2025   CALCIUM 9.1 11/20/2012 2025   PROT 7.1 11/20/2012 2025   ALBUMIN 3.3* 11/20/2012 2025   AST 13 11/20/2012 2025   ALT 9 11/20/2012 2025   ALKPHOS 58 11/20/2012 2025   BILITOT 0.2* 11/20/2012 2025   GFRNONAA >90 11/20/2012 2025   GFRAA >90 11/20/2012 2025   Lipase     Component Value Date/Time   LIPASE 31 11/20/2012 2025       Studies/Results: US Transvaginal Non-ob  11/30/2012   *RADIOLOGY REPORT*  Clinical Data: Right lower quadrant pain. LMP 11/22/2012  TRANSABDOMINAL AND TRANSVAGINAL ULTRASOUND OF PELVIS Technique:  Both transabdominal and transvaginal ultrasound examinations of the pelvis were performed. Transabdominal technique was performed for global imaging of the pelvis including uterus, ovaries, adnexal regions, and pelvic cul-de-sac.  It was  necessary to proceed with endovaginal exam following the transabdominal exam to visualize the myometrium, endometrium and adnexa.  Comparison:  None  Findings:  Uterus: Is anteverted and anteflexed and demonstrates a sagittal length of 10.9 cm, depth of 6.0 cm and width of 6.1 cm.  A focal mural fibroid is identified in the right lateral mid body measuring 2.1 x 1.7 x 1.9 cm.  Two prior C-section incision sites are seen in the lower uterine segment.  No other focal mural abnormalities are identified.  Endometrium: Appears homogeneously echogenic with a width of 7 mm. No areas of focal thickening or heterogeneity are seen. Some linear vascularity is seen within the endometrial lining with color Doppler assessment and this is of questionable significance given lack of visualization of a focal abnormality such as a polyp.  Right ovary:  Has a normal appearance measuring 2.7 x 1.5 x 2.2 cm  Left ovary: As a normal appearance measuring 3.3 x 1.4 x 2.2 cm  Other findings: No pelvic fluid or separate adnexal masses are seen.  IMPRESSION: Focal fibroid with size and location as noted above.  Some linear vascularity extending into the endometrial canal is of questionable significance given the lack of a visualized focal endometrial abnormality.  Normal ovaries.   Original Report Authenticated By: Rhodia Albright, M.D.   US Pelvis Complete  11/30/2012   *  RADIOLOGY REPORT*  Clinical Data: Right lower quadrant pain. LMP 11/22/2012  TRANSABDOMINAL AND TRANSVAGINAL ULTRASOUND OF PELVIS Technique:  Both transabdominal and transvaginal ultrasound examinations of the pelvis were performed. Transabdominal technique was performed for global imaging of the pelvis including uterus, ovaries, adnexal regions, and pelvic cul-de-sac.  It was necessary to proceed with endovaginal exam following the transabdominal exam to visualize the myometrium, endometrium and adnexa.  Comparison:  None  Findings:  Uterus: Is anteverted and anteflexed  and demonstrates a sagittal length of 10.9 cm, depth of 6.0 cm and width of 6.1 cm.  A focal mural fibroid is identified in the right lateral mid body measuring 2.1 x 1.7 x 1.9 cm.  Two prior C-section incision sites are seen in the lower uterine segment.  No other focal mural abnormalities are identified.  Endometrium: Appears homogeneously echogenic with a width of 7 mm. No areas of focal thickening or heterogeneity are seen. Some linear vascularity is seen within the endometrial lining with color Doppler assessment and this is of questionable significance given lack of visualization of a focal abnormality such as a polyp.  Right ovary:  Has a normal appearance measuring 2.7 x 1.5 x 2.2 cm  Left ovary: As a normal appearance measuring 3.3 x 1.4 x 2.2 cm  Other findings: No pelvic fluid or separate adnexal masses are seen.  IMPRESSION: Focal fibroid with size and location as noted above.  Some linear vascularity extending into the endometrial canal is of questionable significance given the lack of a visualized focal endometrial abnormality.  Normal ovaries.   Original Report Authenticated By: Rhodia Albright, M.D.   Ct Abdomen Pelvis W Contrast  11/30/2012   *RADIOLOGY REPORT*  Clinical Data: Right lower quadrant and pelvic pain for 2 days, history hypertension, uterine fibroid  CT ABDOMEN AND PELVIS WITH CONTRAST  Technique:  Multidetector CT imaging of the abdomen and pelvis was performed following the standard protocol during bolus administration of intravenous contrast. Sagittal and coronal MPR images reconstructed from axial data set.  Contrast: OMNIPAQUE IOHEXOL 300 MG/ML  SOLN, 50mL OMNIPAQUE IOHEXOL 300 MG/ML  SOLN  Comparison: None  Findings: Minimal atelectasis left lung base. Gallbladder surgically absent. Liver, spleen, pancreas, kidneys, and adrenal glands normal appearance. Normal appendix. Small umbilical hernia containing fat. The presence of mild surrounding edema and minimal fluid within  the hernia sac raises question of incarceration.  Unremarkable bladder. A small amount of high attenuation is seen within ureters bilaterally likely related to excrete contrast material, imaging appears late versus contrast injection, unable to completely exclude tiny nonobstructing residual calculi in this setting. No ureteral dilatation or hydronephrosis. Unremarkable bladder, uterus and adnexae. Stomach and bowel loops normal appearance for technique. No mass, adenopathy, free fluid, or inflammatory process. No acute osseous findings.  IMPRESSION: Small umbilical hernia containing fat, with mild infiltrative changes surrounding the hernia sac and minimal fluid within the hernia sac raising question of incarceration; recommend correlation for symptoms at this site. Normal appendix. No other definite intra-abdominal or intrapelvic abnormalities as above.   Original Report Authenticated By: Ulyses Southward, M.D.   US Renal  11/30/2012   *RADIOLOGY REPORT*  Clinical Data: Right lower quadrant and right leg pain for 2 days  RENAL/URINARY TRACT ULTRASOUND COMPLETE  Comparison:  Abdominal ultrasound on 09-29-2012  Findings:  Right Kidney:  The right kidney demonstrates a sagittal length of 11.3 cm.  No focal parenchymal abnormalities are seen but overall parenchymal evaluation is somewhat compromised by decreased resolution due to the  patient body habitus. No signs of hydronephrosis are evident.  Left Kidney:  The left kidney demonstrates a sagittal length of 11.8 cm.  No focal parenchymal abnormality is identified but overall resolution is compromised by patient body habitus.  No hydronephrosis is seen.  Bladder:  Has a normal filled appearance of bilateral ureteral jets are evident  IMPRESSION: Somewhat compromised parenchymal evaluation due to patient body habitus.  No definite abnormality seen.   Original Report Authenticated By: Rhodia Albright, M.D.    Anti-infectives: Anti-infectives   None        Assessment/Plan  1. Incarcerated incisional hernia with omentum 2. Obese  Plan: 1. Keep NPO.  Hope for OR today if schedule allows   LOS: 1 day    Chael Urenda E 12/02/2012, 8:03 AM Pager: [phone]

## 2012-12-02 NOTE — Progress Notes (Signed)
Agree with above 

## 2012-12-02 NOTE — Care Management Note (Signed)
CARE MANAGEMENT NOTE 12/02/2012  Patient:  [name], [name]   Account Number:  [id number]  Date Initiated:  12/02/2012  Documentation initiated by:  Arvle Grabe  Subjective/Objective Assessment:   27 yo female admitted with ABD pain. Planned surgical repair of ventral hernia.     Action/Plan:   Home when stable   Anticipated DC Date:     Anticipated DC Plan:  HOME/SELF CARE  In-house referral  NA      DC Planning Services  CM consult      Choice offered to / List presented to:  NA   DME arranged  NA      DME agency  NA     HH arranged  NA      HH agency  NA   Status of service:  In process, will continue to follow Medicare Important Message given?   (If response is "NO", the following Medicare IM given date fields will be blank) Date Medicare IM given:   Date Additional Medicare IM given:    Discharge Disposition:    Per UR Regulation:  Reviewed for med. necessity/level of care/duration of stay  If discussed at Long Length of Stay Meetings, dates discussed:    Comments:  12/02/12 1051 Keslyn Teater,RN,BSN [phone] Chart reviewed for utiization of services. No needs indentified at this time

## 2012-12-02 NOTE — H&P (Signed)
[name] is an 27 y.o. female.   Chief Complaint: abdominal pain HPI: patient is a 27 year old female who presents to the emergency department for persistent abdominal pain. She is approximately 2 months following laparoscopic cholecystectomy. She reports she did well following the surgery with relief of her upper abdominal pain. However sometime soon after that she began to develop episodic sharp pain which she localizes around her umbilicus. This initially was not to frequent or severe. It seemed to be made worse by bending over. However for the past 2 days she has had more severe persistent pain at her umbilicus. She says is worse than her gallbladder pain. She has had some nausea but no vomiting. She presented to the [facility] yesterday and was felt to possibly have PID and was started on antibiotics. However the pain persisted and worsened today and she presented to the emergency department at [facility]. The pain does radiate somewhat towards the right lower quadrant as well. She denies urinary or vaginal symptoms. She has been somewhat nauseated and vomited once after taking medication today.  Past Medical History  Diagnosis Date  . Bronchitis   . Hypertension   . Acid reflux   . Pregnancy induced hypertension   . Urinary tract infection   . History of PID   . Abnormal Pap smear     colposcopy, mild dysplasia, HPV  . Gonorrhea   . Chlamydia   . Trichomonas   . Depression     on meds for bipolar  . Anxiety     on meds- stable  . Obesity   . Sleep apnea     hasnt uses CPAP x 4 years  . Fibroid     Past Surgical History  Procedure Laterality Date  . Wisdom tooth extraction    . Foot surgery    . Tubal ligation    . Cesarean section  05/02/03,05/04/08,10/23/10    x3  . Cholecystectomy N/A 10/07/2012    Procedure: LAPAROSCOPIC CHOLECYSTECTOMY WITH INTRAOPERATIVE CHOLANGIOGRAM;  Surgeon: Wilmon Arms. Corliss Skains, MD;  Location: [facility];  Service: General;  Laterality: N/A;    Family  History  Problem Relation Age of Onset  . Other Neg Hx   . Hypertension Mother   . Heart disease Father   . Stroke Father    Social History:  reports that she has been smoking Cigarettes.  She has a .25 pack-year smoking history. She has never used smokeless tobacco. She reports that  drinks alcohol. She reports that she does not use illicit drugs.  Allergies:  Allergies  Allergen Reactions  . Nsaids Hives, Itching and Rash  . Tylenol (Acetaminophen) Hives, Itching and Rash     No current facility-administered medications for this encounter.   Current Outpatient Prescriptions  Medication Sig Dispense Refill  . doxycycline (VIBRAMYCIN) 100 MG capsule Take 1 capsule (100 mg total) by mouth 2 (two) times daily.  20 capsule  0  . HYDROcodone-acetaminophen (NORCO) 5-325 MG per tablet Take 1 tablet by mouth every 6 (six) hours as needed for pain.  20 tablet  0  . Lurasidone HCl (LATUDA) 20 MG TABS Take 20 mg by mouth at bedtime.      . sertraline (ZOLOFT) 50 MG tablet Take 50 mg by mouth daily with breakfast.          Results for orders placed during the hospital encounter of 12/01/12 (from the past 48 hour(s))  URINALYSIS, ROUTINE W REFLEX MICROSCOPIC     Status: Abnormal  Collection Time    12/01/12 11:53 PM      Result Value Range   Color, Urine YELLOW  YELLOW   APPearance CLOUDY (*) CLEAR   Specific Gravity, Urine 1.033 (*) 1.005 - 1.030   pH 6.0  5.0 - 8.0   Glucose, UA NEGATIVE  NEGATIVE mg/dL   Hgb urine dipstick MODERATE (*) NEGATIVE   Bilirubin Urine SMALL (*) NEGATIVE   Ketones, ur NEGATIVE  NEGATIVE mg/dL   Protein, ur 161 (*) NEGATIVE mg/dL   Urobilinogen, UA 1.0  0.0 - 1.0 mg/dL   Nitrite NEGATIVE  NEGATIVE   Leukocytes, UA MODERATE (*) NEGATIVE  URINE MICROSCOPIC-ADD ON     Status: Abnormal   Collection Time    12/01/12 11:53 PM      Result Value Range   Squamous Epithelial / LPF MANY (*) RARE   WBC, UA 11-20  <3 WBC/hpf   RBC / HPF 0-2  <3 RBC/hpf    Bacteria, UA FEW (*) RARE   Crystals CA OXALATE CRYSTALS (*) NEGATIVE   Urine-Other MUCOUS PRESENT     Comment: LESS THAN 10 mL OF URINE SUBMITTED   US Transvaginal Non-ob  11/30/2012   *RADIOLOGY REPORT*  Clinical Data: Right lower quadrant pain. LMP 11/22/2012  TRANSABDOMINAL AND TRANSVAGINAL ULTRASOUND OF PELVIS Technique:  Both transabdominal and transvaginal ultrasound examinations of the pelvis were performed. Transabdominal technique was performed for global imaging of the pelvis including uterus, ovaries, adnexal regions, and pelvic cul-de-sac.  It was necessary to proceed with endovaginal exam following the transabdominal exam to visualize the myometrium, endometrium and adnexa.  Comparison:  None  Findings:  Uterus: Is anteverted and anteflexed and demonstrates a sagittal length of 10.9 cm, depth of 6.0 cm and width of 6.1 cm.  A focal mural fibroid is identified in the right lateral mid body measuring 2.1 x 1.7 x 1.9 cm.  Two prior C-section incision sites are seen in the lower uterine segment.  No other focal mural abnormalities are identified.  Endometrium: Appears homogeneously echogenic with a width of 7 mm. No areas of focal thickening or heterogeneity are seen. Some linear vascularity is seen within the endometrial lining with color Doppler assessment and this is of questionable significance given lack of visualization of a focal abnormality such as a polyp.  Right ovary:  Has a normal appearance measuring 2.7 x 1.5 x 2.2 cm  Left ovary: As a normal appearance measuring 3.3 x 1.4 x 2.2 cm  Other findings: No pelvic fluid or separate adnexal masses are seen.  IMPRESSION: Focal fibroid with size and location as noted above.  Some linear vascularity extending into the endometrial canal is of questionable significance given the lack of a visualized focal endometrial abnormality.  Normal ovaries.   Original Report Authenticated By: Rhodia Albright, M.D.   Ct Abdomen Pelvis W Contrast  11/30/2012    *RADIOLOGY REPORT*  Clinical Data: Right lower quadrant and pelvic pain for 2 days, history hypertension, uterine fibroid  CT ABDOMEN AND PELVIS WITH CONTRAST  Technique:  Multidetector CT imaging of the abdomen and pelvis was performed following the standard protocol during bolus administration of intravenous contrast. Sagittal and coronal MPR images reconstructed from axial data set.  Contrast: OMNIPAQUE IOHEXOL 300 MG/ML  SOLN, 50mL OMNIPAQUE IOHEXOL 300 MG/ML  SOLN  Comparison: None  Findings: Minimal atelectasis left lung base. Gallbladder surgically absent. Liver, spleen, pancreas, kidneys, and adrenal glands normal appearance. Normal appendix. Small umbilical hernia containing fat. The presence of mild  surrounding edema and minimal fluid within the hernia sac raises question of incarceration.  Unremarkable bladder. A small amount of high attenuation is seen within ureters bilaterally likely related to excrete contrast material, imaging appears late versus contrast injection, unable to completely exclude tiny nonobstructing residual calculi in this setting. No ureteral dilatation or hydronephrosis. Unremarkable bladder, uterus and adnexae. Stomach and bowel loops normal appearance for technique. No mass, adenopathy, free fluid, or inflammatory process. No acute osseous findings.  IMPRESSION: Small umbilical hernia containing fat, with mild infiltrative changes surrounding the hernia sac and minimal fluid within the hernia sac raising question of incarceration; recommend correlation for symptoms at this site. Normal appendix. No other definite intra-abdominal or intrapelvic abnormalities as above.   Original Report Authenticated By: Ulyses Southward, M.D.   US Renal  11/30/2012   *RADIOLOGY REPORT*  Clinical Data: Right lower quadrant and right leg pain for 2 days  RENAL/URINARY TRACT ULTRASOUND COMPLETE  Comparison:  Abdominal ultrasound on 09-29-2012  Findings:  Right Kidney:  The right kidney  demonstrates a sagittal length of 11.3 cm.  No focal parenchymal abnormalities are seen but overall parenchymal evaluation is somewhat compromised by decreased resolution due to the patient body habitus. No signs of hydronephrosis are evident.  Left Kidney:  The left kidney demonstrates a sagittal length of 11.8 cm.  No focal parenchymal abnormality is identified but overall resolution is compromised by patient body habitus.  No hydronephrosis is seen.  Bladder:  Has a normal filled appearance of bilateral ureteral jets are evident  IMPRESSION: Somewhat compromised parenchymal evaluation due to patient body habitus.  No definite abnormality seen.   Original Report Authenticated By: Rhodia Albright, M.D.    Review of Systems  Constitutional: Negative for fever and chills.  Respiratory: Negative.   Cardiovascular: Positive for chest pain. Negative for palpitations and leg swelling.  Gastrointestinal: Positive for nausea, vomiting and abdominal pain. Negative for heartburn, diarrhea and constipation.  Genitourinary: Negative.   Musculoskeletal: Negative.   Psychiatric/Behavioral: The patient is nervous/anxious.     Blood pressure 156/103, pulse 82, temperature 99 F (37.2 C), temperature source Oral, resp. rate 16, height 5\' 5"  (1.651 m), weight 300 lb (136.079 kg), last menstrual period 11/22/2012, SpO2 99.00%. Physical Exam  General: obese African American female, in no distress Skin: Warm and dry without rash or infection. HEENT: No palpable masses or thyromegaly. Sclera nonicteric. Pupils equal round and reactive. Oropharynx clear. Lymph nodes: No cervical, supraclavicular, or inguinal nodes palpable. Lungs: Breath sounds clear and equal without increased work of breathing Cardiovascular: Regular rate and rhythm without murmur. No JVD or edema. Peripheral pulses intact. Abdomen: Obese. Nondistended. She is quite tender in the periumbilical area. There is a well-healed approximately 6 cm  incision in the midline just above the umbilicus. I cannot feel any definite mass or hernia but the exam is limited due to obesity. There is also mild to moderate tenderness in the right midabdomen and right lower quadrant. Extremities: No edema or joint swelling or deformity. No chronic venous stasis changes. Neurologic: Alert and fully oriented. No gross motor or sensory deficits.  Assessment/Plan Persistent and worsening periumbilical pain. I personally reviewed her CT scan. The only abnormality is a ventral hernia at or near the umbilicus which contains fat and some fluid and there is some surrounding inflammatory change. This could represent an incarcerated primary umbilical hernia or incarcerated incisional hernia from her recent laparoscopic cholecystectomy. She had an 11 mm trocar placed just above the umbilicus without any  fascial incision or suturing. Her pain is persistent and worsening and I believe she will need to be admitted for treatment. I would think laparoscopy to diagnose and confirm the problem and then repair of her hernia either laparoscopically or open would be indicated. This was discussed with the patient. All questions were answered.  Leslee Suire T 12/02/2012, 2:07 AM

## 2012-12-02 NOTE — Op Note (Signed)
12/01/2012 - 12/02/2012  10:07 PM  PATIENT:  [name]  27 y.o. female  Patient Care Team: Renaye Rakers, MD as PCP - General (Family Medicine)  PRE-OPERATIVE DIAGNOSIS:  Incarcerated Incisional hernia  POST-OPERATIVE DIAGNOSIS:  Incarcerated Incisional hernia  PROCEDURE:  Procedure(s): LAPAROSCOPIC repair of incarcerated incisional hernia INSERTION OF MESH  SURGEON:  Surgeon(s): Ardeth Sportsman, MD  ASSISTANT: RN   ANESTHESIA:   local and general  EBL:  Total I/O In: 1000 [I.V.:1000] Out: -   Delay start of Pharmacological VTE agent (>24hrs) due to surgical blood loss or risk of bleeding:  no  DRAINS: none   SPECIMEN:  Source of Specimen:  Omentum & hernia sac  DISPOSITION OF SPECIMEN:  N/A  COUNTS:  YES  PLAN OF CARE: Admit to inpatient   PATIENT DISPOSITION:  PACU - hemodynamically stable.  INDICATION: Morbidly obese female status post urgent cholecystectomy a few months ago.  Developed pain and a pole.  Found to have inflamed fatty mass presumed to be an incarcerated hernia at the supraumbilical extraction site.  Admitted for uncontrolled pain.  Recommended urgent evaluation and repair.   The anatomy & physiology of the abdominal wall was discussed. The pathophysiology of hernias was discussed. Natural history risks without surgery including progeressive enlargement, pain, incarceration & strangulation was discussed. Contributors to complications such as smoking, obesity, diabetes, prior surgery, etc were discussed.  I feel the risks of no intervention will lead to serious problems that outweigh the operative risks; therefore, I recommended surgery to reduce and repair the hernia. I explained laparoscopic techniques with possible need for an open approach. I noted the probable use of mesh to patch and/or buttress the hernia repair  Risks such as bleeding, infection, abscess, need for further treatment, heart attack, death, and other risks were discussed. I noted a  good likelihood this will help address the problem. Goals of post-operative recovery were discussed as well. Possibility that this will not correct all symptoms was explained. I stressed the importance of low-impact activity, aggressive pain control, avoiding constipation, & not pushing through pain to minimize risk of post-operative chronic pain or injury. Possibility of reherniation especially with smoking, obesity, diabetes, immunosuppression, and other health conditions was discussed. We will work to minimize complications.  An educational handout further explaining the pathology & treatment options was given as well. Questions were answered. The patient expresses understanding & wishes to proceed with surgery.   OR FINDINGS:   Supraumbilical incisional hernia Swiss cheese type.  3 x 1.5 cm region.  Greater omentum incarcerated within it  Type of repair - Laparoscopic underlay repair   Name of mesh - Bard Caren Macadam?  Size of mesh - Length 21 cm, Width 15 cm  Mesh overlap - 5-7 cm, transverse positioning  Placement of mesh - Intraperitoneal underlay repair   DESCRIPTION:   Informed consent was confirmed. The patient underwent general anaesthesia without difficulty. The patient was positioned appropriately. VTE prevention in place. The patient's abdomen was clipped, prepped, & draped in a sterile fashion. Surgical timeout confirmed our plan.  The patient was positioned in reverse Trendelenburg. Abdominal entry was gained using optical entry technique in the left upper abdomen. Entry was clean. I induced carbon dioxide insufflation. Camera inspection revealed no injury. Extra ports were carefully placed under direct laparoscopic visualization.   I could see A moderate swath of greater omentum in the periumbilical supraumbilical region.  There is some epiploic appendages stuck up there as well but no colon.  I  did laparoscopic lysis of adhesions to expose the entire anterior abdominal wall.  I  primarily used and focused scissors.   I was able to reduce out diffuse was cheese hernias containing some greater omentum.   I made sure hemostasis was good.  I mapped out the region using a needle passer.   To ensure that I would have at least 5 cm radial coverage outside of the hernia defect, I chose a 21x15 cm dual sided mesh.  I placed #1 Prolene stitches around its edge about every 5 cm = 14 total.    I opened up the subcutaneous tissues through a supraumbilical incision.  I encountered a hernia sac with some omentum incarcerated within it.  I freed that off using cautery to better expose the Swiss cheese defects from the superficial side.  I rolled the mesh & placed into the peritoneal cavity through the ventral wall hernia.10 cm fascial defect.  I unrolled  the mesh and positioned it appropriately.  I secured the mesh to cover up the hernia defect using a laparoscopic suture passer to pass the tails of the Prolene through the abdominal wall & tagged them with clamps.  I started out in four corners to make sure I had the mesh centered over the hernia defect appropriately, and then proceeded to work in quadrants.  We evacuated CO2 & desufflated the abdomen.  I tied the fascial stitches down.  I reinsufflated the abdomen.  The mesh provided at least 5-10 cm circumferential coverage around the entire region of hernia defects.   I tacked the edges & central part of the mesh with SecureStrap absorbable tacks.   Hemostasis was excellent.  I closed the fascia port site on the 10 mm port using a 0 Vicryl stitch using laparoscopic intracorporeal suture passer. I then placed On-Q catheter sheaths in the preperitoneal plane under direct laparoscopic guidance from the subxiphoid region down to the posterior iliac crests in the lower flanks. I did reinspection. Hemostasis was good. Mesh laid well. Capnoperitoneum was evacuated. Ports were removed. The skin was closed with Monocryl at the port sites and Steri-Strips  on the fascial stitch puncture sites. OnQ catheters were placed and the sheathes peeled away. On-Q pump was secured. Patient is being extubated to go to the recovery room. I'm about discussed operative findings with the patient's family.

## 2012-12-02 NOTE — Transfer of Care (Signed)
Immediate Anesthesia Transfer of Care Note  Patient: [name]  Procedure(s) Performed: Procedure(s): LAPAROSCOPIC UMBILICAL HERNIA (N/A) INSERTION OF MESH (N/A)  Patient Location: PACU  Anesthesia Type:General  Level of Consciousness: awake, alert , oriented and patient cooperative  Airway & Oxygen Therapy: Patient Spontanous Breathing and Patient connected to face mask oxygen  Post-op Assessment: Report given to PACU RN and Post -op Vital signs reviewed and stable  Post vital signs: Reviewed and stable  Complications: No apparent anesthesia complications

## 2012-12-02 NOTE — Anesthesia Preprocedure Evaluation (Signed)
Anesthesia Evaluation  Patient identified by MRN, date of birth, ID band Patient awake    Reviewed: Allergy & Precautions, H&P , NPO status , Patient's Chart, lab work & pertinent test results  Airway Mallampati: III TM Distance: <3 FB Neck ROM: Full    Dental no notable dental hx.    Pulmonary sleep apnea , Current Smoker,  breath sounds clear to auscultation  + decreased breath sounds      Cardiovascular hypertension, Pt. on medications Rhythm:Regular Rate:Normal     Neuro/Psych negative neurological ROS  negative psych ROS   GI/Hepatic negative GI ROS, Neg liver ROS,   Endo/Other  Morbid obesity  Renal/GU negative Renal ROS  negative genitourinary   Musculoskeletal negative musculoskeletal ROS (+)   Abdominal   Peds negative pediatric ROS (+)  Hematology negative hematology ROS (+)   Anesthesia Other Findings   Reproductive/Obstetrics negative OB ROS                           Anesthesia Physical Anesthesia Plan  ASA: III  Anesthesia Plan: General   Post-op Pain Management:    Induction: Intravenous  Airway Management Planned: Oral ETT  Additional Equipment:   Intra-op Plan:   Post-operative Plan: Extubation in OR  Informed Consent: I have reviewed the patients History and Physical, chart, labs and discussed the procedure including the risks, benefits and alternatives for the proposed anesthesia with the patient or authorized representative who has indicated his/her understanding and acceptance.   Dental advisory given  Plan Discussed with: CRNA and Surgeon  Anesthesia Plan Comments:         Anesthesia Quick Evaluation

## 2012-12-02 NOTE — Progress Notes (Addendum)
General: Pt awake/alert/oriented x4 in no major acute distress Eyes: PERRL, normal EOM. Sclera nonicteric Neuro: CN II-XII intact w/o focal sensory/motor deficits. Lymph: No head/neck/groin lymphadenopathy Psych:  No delerium/psychosis/paranoia.  Anxious but stable HENT: Normocephalic, Mucus membranes moist.  No thrush Neck: Supple, No tracheal deviation Chest: No pain.  Good respiratory excursion. CV:  Pulses intact.  Regular rhythm MS: Normal AROM mjr joints.  No obvious deformity Abdomen: Soft, Nondistended.  Tender in central abdomen.  Ext:  SCDs BLE.  No significant edema.  No cyanosis Skin: No petechiae / purpura  Prob incarcerated port-site hernia.    Rec Dx lap with prob hernia repair:  The anatomy & physiology of the abdominal wall was discussed.  The pathophysiology of hernias was discussed.  Natural history risks without surgery including progeressive enlargement, pain, incarceration & strangulation was discussed.   Contributors to complications such as smoking, obesity, diabetes, prior surgery, etc were discussed.   I feel the risks of no intervention will lead to serious problems that outweigh the operative risks; therefore, I recommended surgery to reduce and repair the hernia.  I explained laparoscopic techniques with possible need for an open approach.  I noted the probable use of mesh to patch and/or buttress the hernia repair  Risks such as bleeding, infection, abscess, need for further treatment, heart attack, death, and other risks were discussed.  I noted a good likelihood this will help address the problem.   Goals of post-operative recovery were discussed as well.  Possibility that this will not correct all symptoms was explained.  I stressed the importance of low-impact activity, aggressive pain control, avoiding constipation, & not pushing through pain to minimize risk of post-operative chronic pain or injury. Possibility of reherniation especially with smoking, obesity,  diabetes, immunosuppression, and other health conditions was discussed.  We will work to minimize complications.     An educational handout further explaining the pathology & treatment options was given as well.  Questions were answered.  The patient expresses understanding & wishes to proceed with surgery.

## 2012-12-03 ENCOUNTER — Encounter (HOSPITAL_COMMUNITY): Payer: Self-pay | Admitting: Surgery

## 2012-12-03 ENCOUNTER — Observation Stay (HOSPITAL_COMMUNITY): Payer: Medicaid Other

## 2012-12-03 DIAGNOSIS — K219 Gastro-esophageal reflux disease without esophagitis: Secondary | ICD-10-CM | POA: Insufficient documentation

## 2012-12-03 DIAGNOSIS — F419 Anxiety disorder, unspecified: Secondary | ICD-10-CM | POA: Insufficient documentation

## 2012-12-03 DIAGNOSIS — I10 Essential (primary) hypertension: Secondary | ICD-10-CM | POA: Insufficient documentation

## 2012-12-03 DIAGNOSIS — K430 Incisional hernia with obstruction, without gangrene: Secondary | ICD-10-CM | POA: Diagnosis present

## 2012-12-03 DIAGNOSIS — G4730 Sleep apnea, unspecified: Secondary | ICD-10-CM | POA: Insufficient documentation

## 2012-12-03 MED ORDER — OXYCODONE HCL 5 MG PO TABS
10.0000 mg | ORAL_TABLET | ORAL | Status: DC | PRN
  Administered 2012-12-03 – 2012-12-04 (×3): 15 mg via ORAL
  Filled 2012-12-03 (×3): qty 3

## 2012-12-03 MED ORDER — METHOCARBAMOL 750 MG PO TABS
750.0000 mg | ORAL_TABLET | Freq: Four times a day (QID) | ORAL | Status: DC
  Administered 2012-12-03 – 2012-12-04 (×5): 750 mg via ORAL
  Filled 2012-12-03 (×8): qty 1

## 2012-12-03 NOTE — Progress Notes (Signed)
Seen,, agree with above.  Home probably tomorrow.

## 2012-12-03 NOTE — Progress Notes (Signed)
Patient ID: [name], female   DOB: 1986-05-22, 27 y.o.   MRN: [id number] 1 Day Post-Op  Subjective: Pt having some pain, but controlled.  Foley just removed.  Hasn't voided yet.  Tolerating full liquids last night.  Objective: Vital signs in last 24 hours: Temp:  [98.1 F (36.7 C)-99.2 F (37.3 C)] 99.2 F (37.3 C) (06/27 0555) Pulse Rate:  [74-112] 100 (06/27 0600) Resp:  [13-18] 18 (06/27 0555) BP: (136-155)/(75-111) 147/88 mmHg (06/27 0555) SpO2:  [94 %-100 %] 97 % (06/27 0600) Last BM Date: 11/30/12  Intake/Output from previous day: 06/26 0701 - 06/27 0700 In: 2726 [P.O.:480; I.V.:2246] Out: 1875 [Urine:1875] Intake/Output this shift:    PE: Abd: soft, tender, all incisions are c/d/i with On-Q ball in place.  Few BS, obese  Lab Results:   Recent Labs  11/30/12 1000  WBC 14.6*  HGB 11.0*  HCT 33.5*  PLT 283   BMET No results found for this basename: NA, K, CL, CO2, GLUCOSE, BUN, CREATININE, CALCIUM,  in the last 72 hours PT/INR No results found for this basename: LABPROT, INR,  in the last 72 hours CMP     Component Value Date/Time   NA 137 11/20/2012 2025   K 3.3* 11/20/2012 2025   CL 105 11/20/2012 2025   CO2 26 11/20/2012 2025   GLUCOSE 97 11/20/2012 2025   BUN 10 11/20/2012 2025   CREATININE 0.61 11/20/2012 2025   CALCIUM 9.1 11/20/2012 2025   PROT 7.1 11/20/2012 2025   ALBUMIN 3.3* 11/20/2012 2025   AST 13 11/20/2012 2025   ALT 9 11/20/2012 2025   ALKPHOS 58 11/20/2012 2025   BILITOT 0.2* 11/20/2012 2025   GFRNONAA >90 11/20/2012 2025   GFRAA >90 11/20/2012 2025   Lipase     Component Value Date/Time   LIPASE 31 11/20/2012 2025       Studies/Results: No results found.  Anti-infectives: Anti-infectives   Start     Dose/Rate Route Frequency Ordered Stop   12/02/12 1200  [MAR Hold]  cefOXitin (MEFOXIN) 2 g in dextrose 5 % 50 mL IVPB     (On MAR Hold since 12/02/12 1928)  Comments:  On call to OR   2 g 100 mL/hr over 30 Minutes Intravenous   Once 12/02/12 1126 12/02/12 1955       Assessment/Plan  1. Incarcerated incisional hernia, s/p lap incisional hernia repair with mesh  Plan: 1. Patient needs to mobilize today. 2. Work on pain control 3. Keep binder in place 4. Await ability to void on her own 5. Advance diet 6. If patient tolerates all of these things, she may be able to go home later today; however, i suspect that we are looking at tomorrow.   LOS: 2 days    Jazman Reuter E 12/03/2012, 8:18 AM Pager: [phone]

## 2012-12-04 MED ORDER — OXYCODONE HCL 10 MG PO TABS: 5.0000 mg | ORAL_TABLET | ORAL | Status: DC | PRN

## 2012-12-04 MED ORDER — OXYCODONE HCL 5 MG PO TABS: 5.0000 mg | ORAL_TABLET | Freq: Four times a day (QID) | ORAL | Status: DC | PRN

## 2012-12-04 NOTE — Discharge Summary (Signed)
Physician Discharge Summary  Patient ID: [name] MRN: [id number] DOB/AGE: 29-08-1985 29 y.o.  Admit date: 12/01/2012 Discharge date: 12/04/2012  Admission Diagnoses: Incarcerated ventral incisional hernia- primary problem Patient Active Problem List   Diagnosis Date Noted  . Incarcerated incisional hernia s/p lap repair 12/02/2012 12/03/2012  . Hypertension   . Acid reflux   . Anxiety   . Obesity   . Sleep apnea   . Chronic cholecystitis with calculus s/p lap chole [id number] 10/04/2012    Discharge Diagnoses:  Active Problems:   Incarcerated incisional hernia s/p lap repair 12/02/2012 and above.    Discharged Condition: stable  Hospital Course:  Pt admitted with umbilical pain.  Ct demonstrated omentum incarcerated in hernia at previous incision.  She was taken to OR the next day for laparoscopic assisted repair.  On POD 1, she was tolerating regular diet, but still had a lot of pain and dizziness with IV pain medication.  Her oxycodone PO dose was increased, and this helped significantly.  She is discharged to home on POD 2 in stable condition.  She reports independent ambulation and voiding.    Consults: None  Significant Diagnostic Studies: labs:  Lab Results  Component Value Date   CREATININE 0.61 11/20/2012    Treatments: surgery: see above   Discharge Exam: Blood pressure 155/92, pulse 95, temperature 98.7 F (37.1 C), temperature source Oral, resp. rate 16, height 5\' 5"  (1.651 m), weight 300 lb (136.079 kg), last menstrual period 11/22/2012, SpO2 98.00%. General appearance: alert, cooperative and no distress Resp: breathing comfortably GI: soft, approp tender, non distended, no erythema, OnQ in place, still around 1/2 full.    Disposition: 01-Home or Self Care  Discharge Orders   Future Orders Complete By Expires     Call MD for:  difficulty breathing, headache or visual disturbances  As directed     Call MD for:  persistant nausea and vomiting  As  directed     Call MD for:  redness, tenderness, or signs of infection (pain, swelling, redness, odor or green/yellow discharge around incision site)  As directed     Call MD for:  severe uncontrolled pain  As directed     Call MD for:  temperature >100.4  As directed     Diet - low sodium heart healthy  As directed     Increase activity slowly  As directed     Lifting restrictions  As directed     Comments:      No lifting over 10 pounds for 6 weeks.        Medication List    STOP taking these medications       HYDROcodone-acetaminophen 5-325 MG per tablet  Commonly known as:  NORCO      TAKE these medications       doxycycline 100 MG capsule  Commonly known as:  VIBRAMYCIN  Take 1 capsule (100 mg total) by mouth 2 (two) times daily.     LATUDA 20 MG Tabs  Generic drug:  Lurasidone HCl  Take 20 mg by mouth at bedtime.     oxyCODONE 5 MG immediate release tablet  Commonly known as:  Oxy IR/ROXICODONE  Take 1-4 tablets (5-20 mg total) by mouth every 6 (six) hours as needed for pain.     sertraline 50 MG tablet  Commonly known as:  ZOLOFT  Take 50 mg by mouth daily with breakfast.           Follow-up Information  Follow up with CCS,MD, MD. Schedule an appointment as soon as possible for a visit in 3 weeks.   Contact information:   [address] [place] [address] [phone]       Signed: Almond Lint 12/04/2012, 8:32 AM

## 2012-12-04 NOTE — Progress Notes (Signed)
Pt D/C home, alert and oriented no new compline, pain is controlled, no s/s of distress, d/c orders, medication administration teaching done. Pt verbalizes understanding.

## 2012-12-05 ENCOUNTER — Emergency Department (HOSPITAL_COMMUNITY): Payer: Medicaid Other

## 2012-12-05 ENCOUNTER — Emergency Department (HOSPITAL_COMMUNITY)
Admission: EM | Admit: 2012-12-05 | Discharge: 2012-12-05 | Disposition: A | Discharge status: Home / Self Care | Payer: Medicaid Other | Source: Home / Self Care | Attending: Emergency Medicine | Admitting: Emergency Medicine

## 2012-12-05 ENCOUNTER — Encounter (HOSPITAL_COMMUNITY): Payer: Self-pay | Admitting: Emergency Medicine

## 2012-12-05 ENCOUNTER — Emergency Department (HOSPITAL_COMMUNITY)
Admission: EM | Admit: 2012-12-05 | Discharge: 2012-12-05 | Disposition: A | Discharge status: Home / Self Care | Payer: Medicaid Other | Attending: Emergency Medicine | Admitting: Emergency Medicine

## 2012-12-05 DIAGNOSIS — E669 Obesity, unspecified: Secondary | ICD-10-CM | POA: Insufficient documentation

## 2012-12-05 DIAGNOSIS — I10 Essential (primary) hypertension: Secondary | ICD-10-CM | POA: Insufficient documentation

## 2012-12-05 DIAGNOSIS — F3289 Other specified depressive episodes: Secondary | ICD-10-CM | POA: Insufficient documentation

## 2012-12-05 DIAGNOSIS — Z79899 Other long term (current) drug therapy: Secondary | ICD-10-CM | POA: Insufficient documentation

## 2012-12-05 DIAGNOSIS — Z8619 Personal history of other infectious and parasitic diseases: Secondary | ICD-10-CM | POA: Insufficient documentation

## 2012-12-05 DIAGNOSIS — Z8709 Personal history of other diseases of the respiratory system: Secondary | ICD-10-CM | POA: Insufficient documentation

## 2012-12-05 DIAGNOSIS — F17200 Nicotine dependence, unspecified, uncomplicated: Secondary | ICD-10-CM | POA: Insufficient documentation

## 2012-12-05 DIAGNOSIS — R51 Headache: Secondary | ICD-10-CM | POA: Insufficient documentation

## 2012-12-05 DIAGNOSIS — Z87448 Personal history of other diseases of urinary system: Secondary | ICD-10-CM | POA: Insufficient documentation

## 2012-12-05 DIAGNOSIS — Z87440 Personal history of urinary (tract) infections: Secondary | ICD-10-CM | POA: Insufficient documentation

## 2012-12-05 DIAGNOSIS — Z8742 Personal history of other diseases of the female genital tract: Secondary | ICD-10-CM | POA: Insufficient documentation

## 2012-12-05 DIAGNOSIS — F411 Generalized anxiety disorder: Secondary | ICD-10-CM | POA: Insufficient documentation

## 2012-12-05 DIAGNOSIS — K219 Gastro-esophageal reflux disease without esophagitis: Secondary | ICD-10-CM | POA: Insufficient documentation

## 2012-12-05 DIAGNOSIS — Z8719 Personal history of other diseases of the digestive system: Secondary | ICD-10-CM | POA: Insufficient documentation

## 2012-12-05 DIAGNOSIS — F329 Major depressive disorder, single episode, unspecified: Secondary | ICD-10-CM | POA: Insufficient documentation

## 2012-12-05 LAB — URINE MICROSCOPIC-ADD ON

## 2012-12-05 LAB — URINE CULTURE: Colony Count: 90000

## 2012-12-05 LAB — URINALYSIS, ROUTINE W REFLEX MICROSCOPIC
Bilirubin Urine: NEGATIVE
Glucose, UA: NEGATIVE mg/dL
Ketones, ur: NEGATIVE mg/dL
Nitrite: NEGATIVE
Protein, ur: NEGATIVE mg/dL
Specific Gravity, Urine: 1.007 (ref 1.005–1.030)
Urobilinogen, UA: 0.2 mg/dL (ref 0.0–1.0)
pH: 6.5 (ref 5.0–8.0)

## 2012-12-05 MED ORDER — METOCLOPRAMIDE HCL 5 MG/ML IJ SOLN
10.0000 mg | Freq: Once | INTRAMUSCULAR | Status: AC
  Administered 2012-12-05: 10 mg via INTRAMUSCULAR
  Filled 2012-12-05: qty 2

## 2012-12-05 MED ORDER — HYDROMORPHONE HCL PF 1 MG/ML IJ SOLN
1.0000 mg | Freq: Once | INTRAMUSCULAR | Status: AC
  Administered 2012-12-05: 1 mg via INTRAMUSCULAR
  Filled 2012-12-05: qty 1

## 2012-12-05 MED ORDER — TRAMADOL HCL 50 MG PO TABS
50.0000 mg | ORAL_TABLET | Freq: Once | ORAL | Status: AC
  Administered 2012-12-05: 50 mg via ORAL
  Filled 2012-12-05: qty 1

## 2012-12-05 NOTE — ED Notes (Signed)
Patient C/O HA 7/10. Hernia repair three days ago. Incision sites to abdomen with steri strips. Bupivocaine pump to abdomen.

## 2012-12-05 NOTE — ED Notes (Signed)
Pt d/c yesterday from [facility] after hernia repair. Pt transported via EMS tonight c/o HA, pt states HA began before being d/c now worse. Denies n/v/d, denies fever.

## 2012-12-05 NOTE — ED Provider Notes (Signed)
History    CSN: [id number] Arrival date & time 12/05/12  0609  First MD Initiated Contact with Patient 12/05/12 0654     Chief Complaint  Patient presents with  . Headache   (Consider location/radiation/quality/duration/timing/severity/associated sxs/prior Treatment) Patient is a 27 y.o. female presenting with headaches. The history is provided by the patient.  Headache Associated symptoms: no abdominal pain, no back pain, no diarrhea, no neck pain, no neck stiffness, no numbness, no sore throat and no vomiting   pt c/o gradual onset headache in past 1-2 days. Pod 5 from uncomplicated umbilical hernia repair. States hx same, feels same as prior headaches, gradual onset, mild at onset, no abrupt worsening. Pt states thinks headache is due to lying down more, back of head resting against furniture/bed/chair (as she has been lying down more due to recent surgery).  Pt denies neck pain or stiffness. No eye pain or change in vision. No change in headache whether standing/upright or supine. No nv. No change in speech or vision. No numbness/weakness. No problems w balance or coordination. No sinus drainage, congestion or pain.  Pt has [place] noted occ non prod cough, no sob. States peri-incisional pain has been well controlled w pain pump.  Is eating/drinking. Had normal bm, passing gas. No dysuria or gu c/o. t 998 on arrival to ed.  Denies chills/sweats.     Past Medical History  Diagnosis Date  . Bronchitis   . Hypertension   . Acid reflux   . Pregnancy induced hypertension   . Urinary tract infection   . History of PID   . Abnormal Pap smear     colposcopy, mild dysplasia, HPV  . Gonorrhea   . Chlamydia   . Trichomonas   . Depression     on meds for bipolar  . Anxiety     on meds- stable  . Obesity   . Sleep apnea     hasnt uses CPAP x 4 years  . Fibroid    Past Surgical History  Procedure Laterality Date  . Wisdom tooth extraction    . Foot surgery    . Tubal ligation    .  Cesarean section  05/02/03,05/04/08,10/23/10    x3  . Cholecystectomy N/A 10/07/2012    Procedure: LAPAROSCOPIC CHOLECYSTECTOMY WITH INTRAOPERATIVE CHOLANGIOGRAM;  Surgeon: Wilmon Arms. Corliss Skains, MD;  Location: [facility];  Service: General;  Laterality: N/A;  . Umbilical hernia repair N/A 12/02/2012    Procedure: LAPAROSCOPIC UMBILICAL HERNIA;  Surgeon: Ardeth Sportsman, MD;  Location: [facility];  Service: General;  Laterality: N/A;  . Insertion of mesh N/A 12/02/2012    Procedure: INSERTION OF MESH;  Surgeon: Ardeth Sportsman, MD;  Location: [facility];  Service: General;  Laterality: N/A;   Family History  Problem Relation Age of Onset  . Other Neg Hx   . Hypertension Mother   . Heart disease Father   . Stroke Father    History  Substance Use Topics  . Smoking status: Current Every Day Smoker -- 0.25 packs/day for 1 years    Types: Cigarettes  . Smokeless tobacco: Never Used  . Alcohol Use: Yes     Comment: socially   OB History   Grav Para Term Preterm Abortions TAB SAB Ect Mult Living   3 3 3       3      Review of Systems  Constitutional: Negative for chills.  HENT: Negative for sore throat, neck pain and neck stiffness.   Eyes: Negative  for redness.  Respiratory: Negative for shortness of breath.   Cardiovascular: Negative for chest pain.  Gastrointestinal: Negative for vomiting, abdominal pain and diarrhea.  Genitourinary: Negative for dysuria and flank pain.  Musculoskeletal: Negative for back pain.  Skin: Negative for rash.  Neurological: Positive for headaches. Negative for weakness and numbness.  Hematological: Does not bruise/bleed easily.  Psychiatric/Behavioral: Negative for confusion.    Allergies  Nsaids and Tylenol  Home Medications   Current Outpatient Rx  Name  Route  Sig  Dispense  Refill  . doxycycline (VIBRAMYCIN) 100 MG capsule   Oral   Take 1 capsule (100 mg total) by mouth 2 (two) times daily.   20 capsule   0   . Lurasidone HCl (LATUDA) 20 MG TABS   Oral    Take 20 mg by mouth at bedtime.         Marland Kitchen oxyCODONE (OXY IR/ROXICODONE) 5 MG immediate release tablet   Oral   Take 1-4 tablets (5-20 mg total) by mouth every 6 (six) hours as needed for pain.   100 tablet   0   . sertraline (ZOLOFT) 50 MG tablet   Oral   Take 50 mg by mouth daily with breakfast.           BP 169/116  Pulse 113  Temp(Src) 99.8 F (37.7 C) (Oral)  Resp 20  SpO2 94%  LMP 11/22/2012 Physical Exam  Nursing note and vitals reviewed. Constitutional: She is oriented to person, place, and time. She appears well-developed and well-nourished. No distress.  HENT:  Head: Atraumatic.  Nose: Nose normal.  Mouth/Throat: Oropharynx is clear and moist.  No sinus or temporal tenderness.  Eyes: Conjunctivae and EOM are normal. Pupils are equal, round, and reactive to light. No scleral icterus.  Neck: Neck supple. No tracheal deviation present. No thyromegaly present.  No stiffness or rigidity.   Cardiovascular: Normal rate, regular rhythm, normal heart sounds and intact distal pulses.  Exam reveals no gallop and no friction rub.   No murmur heard. Pulmonary/Chest: Effort normal and breath sounds normal. No respiratory distress.  Abdominal: Soft. Normal appearance and bowel sounds are normal. She exhibits no distension. There is no tenderness.  Obese. Recent surgical incision, healing without sign of infection. Pain pump device upper abd, site without sign of infection.   Genitourinary:  No cva tenderness.  Musculoskeletal: Normal range of motion. She exhibits no edema and no tenderness.  Neurological: She is alert and oriented to person, place, and time. No cranial nerve deficit.  Motor intact bilaterally. Steady gait.   Skin: Skin is warm and dry. No rash noted. She is not diaphoretic.  Psychiatric: She has a normal mood and affect.    ED Course  Procedures (including critical care time)  Results for orders placed during the hospital encounter of 12/05/12   URINALYSIS, ROUTINE W REFLEX MICROSCOPIC      Result Value Range   Color, Urine YELLOW  YELLOW   APPearance CLEAR  CLEAR   Specific Gravity, Urine 1.007  1.005 - 1.030   pH 6.5  5.0 - 8.0   Glucose, UA NEGATIVE  NEGATIVE mg/dL   Hgb urine dipstick SMALL (*) NEGATIVE   Bilirubin Urine NEGATIVE  NEGATIVE   Ketones, ur NEGATIVE  NEGATIVE mg/dL   Protein, ur NEGATIVE  NEGATIVE mg/dL   Urobilinogen, UA 0.2  0.0 - 1.0 mg/dL   Nitrite NEGATIVE  NEGATIVE   Leukocytes, UA SMALL (*) NEGATIVE  URINE MICROSCOPIC-ADD ON  Result Value Range   Squamous Epithelial / LPF FEW (*) RARE   WBC, UA 0-2  <3 WBC/hpf   RBC / HPF 0-2  <3 RBC/hpf   Bacteria, UA RARE  RARE   Dg Chest 2 View  12/05/2012   *RADIOLOGY REPORT*  Clinical Data: Headache, hypertension, feels like heart was racing. Hernia surgery 2 days ago.  CHEST - 2 VIEW  Comparison: 12/03/2012  Findings: Linear subsegmental atelectasis observed in the left lower lobe.  Lungs appear otherwise clear. Cardiac and mediastinal contours appear unremarkable.  No pleural effusion noted.  IMPRESSION: 1.  Linear subsegmental atelectasis, left lower lobe.  Otherwise negative.   Original Report Authenticated By: Gaylyn Rong, M.D.   Dg Chest 2 View  12/03/2012   *RADIOLOGY REPORT*  Clinical Data: Smoker with 1-day history of chest pain.  CHEST - 2 VIEW  Comparison: One-view chest x-ray 11/20/2012, 10/16/2012 and two- view chest x-ray 10/11/2012.  Findings: Suboptimal inspiration due to body habitus which accounts for crowded bronchovascular markings at the bases and accentuates the cardiac silhouette.  Taking this account, cardiomediastinal silhouette unremarkable.  Lungs clear.  Bronchovascular markings normal.  Pulmonary vascularity normal.  No pneumothorax.  No pleural effusions.  Visualized bony thorax intact.  IMPRESSION: Suboptimal inspiration.  No acute cardiopulmonary disease.   Original Report Authenticated By: Hulan Saas, M.D.   US  Transvaginal Non-ob  11/30/2012   *RADIOLOGY REPORT*  Clinical Data: Right lower quadrant pain. LMP 11/22/2012  TRANSABDOMINAL AND TRANSVAGINAL ULTRASOUND OF PELVIS Technique:  Both transabdominal and transvaginal ultrasound examinations of the pelvis were performed. Transabdominal technique was performed for global imaging of the pelvis including uterus, ovaries, adnexal regions, and pelvic cul-de-sac.  It was necessary to proceed with endovaginal exam following the transabdominal exam to visualize the myometrium, endometrium and adnexa.  Comparison:  None  Findings:  Uterus: Is anteverted and anteflexed and demonstrates a sagittal length of 10.9 cm, depth of 6.0 cm and width of 6.1 cm.  A focal mural fibroid is identified in the right lateral mid body measuring 2.1 x 1.7 x 1.9 cm.  Two prior C-section incision sites are seen in the lower uterine segment.  No other focal mural abnormalities are identified.  Endometrium: Appears homogeneously echogenic with a width of 7 mm. No areas of focal thickening or heterogeneity are seen. Some linear vascularity is seen within the endometrial lining with color Doppler assessment and this is of questionable significance given lack of visualization of a focal abnormality such as a polyp.  Right ovary:  Has a normal appearance measuring 2.7 x 1.5 x 2.2 cm  Left ovary: As a normal appearance measuring 3.3 x 1.4 x 2.2 cm  Other findings: No pelvic fluid or separate adnexal masses are seen.  IMPRESSION: Focal fibroid with size and location as noted above.  Some linear vascularity extending into the endometrial canal is of questionable significance given the lack of a visualized focal endometrial abnormality.  Normal ovaries.   Original Report Authenticated By: Rhodia Albright, M.D.   US Pelvis Complete  11/30/2012   *RADIOLOGY REPORT*  Clinical Data: Right lower quadrant pain. LMP 11/22/2012  TRANSABDOMINAL AND TRANSVAGINAL ULTRASOUND OF PELVIS Technique:  Both transabdominal  and transvaginal ultrasound examinations of the pelvis were performed. Transabdominal technique was performed for global imaging of the pelvis including uterus, ovaries, adnexal regions, and pelvic cul-de-sac.  It was necessary to proceed with endovaginal exam following the transabdominal exam to visualize the myometrium, endometrium and adnexa.  Comparison:  None  Findings:  Uterus: Is anteverted and anteflexed and demonstrates a sagittal length of 10.9 cm, depth of 6.0 cm and width of 6.1 cm.  A focal mural fibroid is identified in the right lateral mid body measuring 2.1 x 1.7 x 1.9 cm.  Two prior C-section incision sites are seen in the lower uterine segment.  No other focal mural abnormalities are identified.  Endometrium: Appears homogeneously echogenic with a width of 7 mm. No areas of focal thickening or heterogeneity are seen. Some linear vascularity is seen within the endometrial lining with color Doppler assessment and this is of questionable significance given lack of visualization of a focal abnormality such as a polyp.  Right ovary:  Has a normal appearance measuring 2.7 x 1.5 x 2.2 cm  Left ovary: As a normal appearance measuring 3.3 x 1.4 x 2.2 cm  Other findings: No pelvic fluid or separate adnexal masses are seen.  IMPRESSION: Focal fibroid with size and location as noted above.  Some linear vascularity extending into the endometrial canal is of questionable significance given the lack of a visualized focal endometrial abnormality.  Normal ovaries.   Original Report Authenticated By: Rhodia Albright, M.D.   Ct Abdomen Pelvis W Contrast  11/30/2012   *RADIOLOGY REPORT*  Clinical Data: Right lower quadrant and pelvic pain for 2 days, history hypertension, uterine fibroid  CT ABDOMEN AND PELVIS WITH CONTRAST  Technique:  Multidetector CT imaging of the abdomen and pelvis was performed following the standard protocol during bolus administration of intravenous contrast. Sagittal and coronal MPR  images reconstructed from axial data set.  Contrast: OMNIPAQUE IOHEXOL 300 MG/ML  SOLN, 50mL OMNIPAQUE IOHEXOL 300 MG/ML  SOLN  Comparison: None  Findings: Minimal atelectasis left lung base. Gallbladder surgically absent. Liver, spleen, pancreas, kidneys, and adrenal glands normal appearance. Normal appendix. Small umbilical hernia containing fat. The presence of mild surrounding edema and minimal fluid within the hernia sac raises question of incarceration.  Unremarkable bladder. A small amount of high attenuation is seen within ureters bilaterally likely related to excrete contrast material, imaging appears late versus contrast injection, unable to completely exclude tiny nonobstructing residual calculi in this setting. No ureteral dilatation or hydronephrosis. Unremarkable bladder, uterus and adnexae. Stomach and bowel loops normal appearance for technique. No mass, adenopathy, free fluid, or inflammatory process. No acute osseous findings.  IMPRESSION: Small umbilical hernia containing fat, with mild infiltrative changes surrounding the hernia sac and minimal fluid within the hernia sac raising question of incarceration; recommend correlation for symptoms at this site. Normal appendix. No other definite intra-abdominal or intrapelvic abnormalities as above.   Original Report Authenticated By: Ulyses Southward, M.D.   US Renal  11/30/2012   *RADIOLOGY REPORT*  Clinical Data: Right lower quadrant and right leg pain for 2 days  RENAL/URINARY TRACT ULTRASOUND COMPLETE  Comparison:  Abdominal ultrasound on 09-29-2012  Findings:  Right Kidney:  The right kidney demonstrates a sagittal length of 11.3 cm.  No focal parenchymal abnormalities are seen but overall parenchymal evaluation is somewhat compromised by decreased resolution due to the patient body habitus. No signs of hydronephrosis are evident.  Left Kidney:  The left kidney demonstrates a sagittal length of 11.8 cm.  No focal parenchymal abnormality is  identified but overall resolution is compromised by patient body habitus.  No hydronephrosis is seen.  Bladder:  Has a normal filled appearance of bilateral ureteral jets are evident  IMPRESSION: Somewhat compromised parenchymal evaluation due to patient body habitus.  No definite abnormality seen.  Original Report Authenticated By: Rhodia Albright, M.D.   Dg Abd Acute W/chest  11/20/2012   *RADIOLOGY REPORT*  Clinical Data: Upper abdominal pain  ACUTE ABDOMEN SERIES (ABDOMEN 2 VIEW & CHEST 1 VIEW)  Comparison: 10/16/2012  Findings: The lungs clear.  Heart size normal.  No effusion. Vascular clips in the right upper abdomen.  No free air.  Normal bowel gas pattern.  Regional bones unremarkable.  No abnormal abdominal calcifications.  IMPRESSION: 1.  Normal bowel gas pattern. 2.  No free air. 3.  No acute cardiopulmonary disease.   Original Report Authenticated By: D. Andria Rhein, MD     MDM  Ultram po.  Pt 5 days s/p umb hernia repair, low grade temp, mild non prod cough - will get cxr and ua.  Recheck pt comfortable. Nad.   Reviewed recent visits, recent dx gc, pt is aware, was txd with rocephin and zithromax, and has rx doxy which she is still completing.  Currently no pelvic pain or tenderness on exam. Pt to complete course of rx.  cxr w atelectasis - pt instructed on use incentive spirometer, and discussed need to use, stay active, to minimize risk pna.  Pt voices understanding but says left her IS in the hospital and forgot to take home.  Will provide new one.  Discussed need f/u closely w pcp, incl for recheck, and for recheck bp.   Pt appears stable for d/c.   Suzi Roots, MD 12/05/12 1109

## 2012-12-05 NOTE — ED Notes (Signed)
Patient complained of chest pain b/c she's cold

## 2012-12-05 NOTE — ED Provider Notes (Signed)
History    CSN: [id number] Arrival date & time 12/05/12  0214  First MD Initiated Contact with Patient 12/05/12 [phone]     Chief Complaint  Patient presents with  . Headache   (Consider location/radiation/quality/duration/timing/severity/associated sxs/prior Treatment) HPI Patient presents 4 days postoperatively after a umbilical hernia repair with frontal headache.  She reports headache over the past 24 hours.  She states she sometimes has headaches but this seemed to be more severe.  She denies photophobia and phonophobia.  She is taking oxycodone postoperatively for her abdominal pain and although this seems to be slightly helping her headache she still present in the emergency para per evaluation.  She denies weakness of her upper lower extremities.  No recent fall or trauma to her head.  She reports that her surgical case lasted approximately one hour.  This was not a robotic case.  Her symptoms are mild to moderate in severity.    Past Medical History  Diagnosis Date  . Bronchitis   . Hypertension   . Acid reflux   . Pregnancy induced hypertension   . Urinary tract infection   . History of PID   . Abnormal Pap smear     colposcopy, mild dysplasia, HPV  . Gonorrhea   . Chlamydia   . Trichomonas   . Depression     on meds for bipolar  . Anxiety     on meds- stable  . Obesity   . Sleep apnea     hasnt uses CPAP x 4 years  . Fibroid    Past Surgical History  Procedure Laterality Date  . Wisdom tooth extraction    . Foot surgery    . Tubal ligation    . Cesarean section  05/02/03,05/04/08,10/23/10    x3  . Cholecystectomy N/A 10/07/2012    Procedure: LAPAROSCOPIC CHOLECYSTECTOMY WITH INTRAOPERATIVE CHOLANGIOGRAM;  Surgeon: Wilmon Arms. Corliss Skains, MD;  Location: [facility];  Service: General;  Laterality: N/A;  . Umbilical hernia repair N/A 12/02/2012    Procedure: LAPAROSCOPIC UMBILICAL HERNIA;  Surgeon: Ardeth Sportsman, MD;  Location: [facility];  Service: General;  Laterality: N/A;   . Insertion of mesh N/A 12/02/2012    Procedure: INSERTION OF MESH;  Surgeon: Ardeth Sportsman, MD;  Location: [facility];  Service: General;  Laterality: N/A;   Family History  Problem Relation Age of Onset  . Other Neg Hx   . Hypertension Mother   . Heart disease Father   . Stroke Father    History  Substance Use Topics  . Smoking status: Current Every Day Smoker -- 0.25 packs/day for 1 years    Types: Cigarettes  . Smokeless tobacco: Never Used  . Alcohol Use: Yes     Comment: socially   OB History   Grav Para Term Preterm Abortions TAB SAB Ect Mult Living   3 3 3       3      Review of Systems  All other systems reviewed and are negative.    Allergies  Nsaids and Tylenol  Home Medications   Current Outpatient Rx  Name  Route  Sig  Dispense  Refill  . doxycycline (VIBRAMYCIN) 100 MG capsule   Oral   Take 1 capsule (100 mg total) by mouth 2 (two) times daily.   20 capsule   0   . Lurasidone HCl (LATUDA) 20 MG TABS   Oral   Take 20 mg by mouth at bedtime.         Marland Kitchen  oxyCODONE (OXY IR/ROXICODONE) 5 MG immediate release tablet   Oral   Take 1-4 tablets (5-20 mg total) by mouth every 6 (six) hours as needed for pain.   100 tablet   0   . sertraline (ZOLOFT) 50 MG tablet   Oral   Take 50 mg by mouth daily with breakfast.           BP 173/118  Pulse 94  Temp(Src) 99.8 F (37.7 C) (Oral)  Resp 20  SpO2 95%  LMP 11/22/2012 Physical Exam  Nursing note and vitals reviewed. Constitutional: She is oriented to person, place, and time. She appears well-developed and well-nourished. No distress.  HENT:  Head: Normocephalic and atraumatic.  Eyes: EOM are normal. Pupils are equal, round, and reactive to light.  Neck: Normal range of motion.  Cardiovascular: Normal rate, regular rhythm and normal heart sounds.   Pulmonary/Chest: Effort normal and breath sounds normal.  Abdominal: Soft. She exhibits no distension. There is no tenderness.  Musculoskeletal: Normal  range of motion.  Neurological: She is alert and oriented to person, place, and time.  5/5 strength in major muscle groups of  bilateral upper and lower extremities. Speech normal. No facial asymetry.   Skin: Skin is warm and dry.  Psychiatric: She has a normal mood and affect. Judgment normal.    ED Course  Procedures (including critical care time) Labs Reviewed - No data to display  1. Headache     MDM  5:32 AM Nonspecific headache.  Feels much better at this time.  Discharge home in good condition.  Hypertension noted.  This is also in the setting of pain.  Repeat blood pressure improved.  Oral temperature is 99.8 but she denies any new abdominal discomfort or pain  Lyanne Co, MD 12/05/12 [phone]

## 2012-12-05 NOTE — ED Notes (Signed)
MD Steinl aware of CP

## 2012-12-05 NOTE — ED Notes (Signed)
Pt c/o headache, pt seen for same here tonight, after d/c in waiting room pt states she began feeling "funny" with pressure in head and heart racing. Pt intermittently falling asleep during triage interview.

## 2012-12-07 ENCOUNTER — Emergency Department (HOSPITAL_COMMUNITY)
Admission: EM | Admit: 2012-12-07 | Discharge: 2012-12-07 | Disposition: A | Discharge status: Home / Self Care | Payer: Medicaid Other | Source: Home / Self Care

## 2012-12-07 DIAGNOSIS — J029 Acute pharyngitis, unspecified: Secondary | ICD-10-CM

## 2012-12-07 LAB — POCT RAPID STREP A: Streptococcus, Group A Screen (Direct): NEGATIVE

## 2012-12-07 NOTE — ED Provider Notes (Signed)
History    CSN: [id number] Arrival date & time 12/07/12  4696  First MD Initiated Contact with Patient 12/07/12 1048     Chief Complaint  Patient presents with  . Sore Throat   (Consider location/radiation/quality/duration/timing/severity/associated sxs/prior Treatment) HPI  27 yo Salinas presents today with the above complaint.  Sore throat x one day and progressively worsening.  She is currently taking doxycycline for recent PID.  Also had rocephin and zithromax for gonococcal infection.  Has noticed white patchy areas on tongue and back of throat.  Pain with swallowing.  Denies fever, chills, cp, sob.  Also recovering from hernia surgery 25 June.   Past Medical History  Diagnosis Date  . Bronchitis   . Hypertension   . Acid reflux   . Pregnancy induced hypertension   . Urinary tract infection   . History of PID   . Abnormal Pap smear     colposcopy, mild dysplasia, HPV  . Gonorrhea   . Chlamydia   . Trichomonas   . Depression     on meds for bipolar  . Anxiety     on meds- stable  . Obesity   . Sleep apnea     hasnt uses CPAP x 4 years  . Fibroid    Past Surgical History  Procedure Laterality Date  . Wisdom tooth extraction    . Foot surgery    . Tubal ligation    . Cesarean section  05/02/03,05/04/08,10/23/10    x3  . Cholecystectomy N/A 10/07/2012    Procedure: LAPAROSCOPIC CHOLECYSTECTOMY WITH INTRAOPERATIVE CHOLANGIOGRAM;  Surgeon: Wilmon Arms. Corliss Skains, MD;  Location: [facility];  Service: General;  Laterality: N/A;  . Umbilical hernia repair N/A 12/02/2012    Procedure: LAPAROSCOPIC UMBILICAL HERNIA;  Surgeon: Ardeth Sportsman, MD;  Location: [facility];  Service: General;  Laterality: N/A;  . Insertion of mesh N/A 12/02/2012    Procedure: INSERTION OF MESH;  Surgeon: Ardeth Sportsman, MD;  Location: [facility];  Service: General;  Laterality: N/A;   Family History  Problem Relation Age of Onset  . Other Neg Hx   . Hypertension Mother   . Heart disease Father   . Stroke Father     History  Substance Use Topics  . Smoking status: Current Every Day Smoker -- 0.25 packs/day for 1 years    Types: Cigarettes  . Smokeless tobacco: Never Used  . Alcohol Use: Yes     Comment: socially   OB History   Grav Para Term Preterm Abortions TAB SAB Ect Mult Living   3 3 3       3      Review of Systems  Constitutional: Negative.   HENT: Positive for sore throat and trouble swallowing. Negative for congestion, rhinorrhea, sneezing, mouth sores, dental problem, voice change, postnasal drip and sinus pressure.   Eyes: Negative.   Respiratory: Negative.   Cardiovascular: Negative.   Gastrointestinal: Negative.   Endocrine: Negative.   Genitourinary: Negative.   Musculoskeletal: Negative.     Allergies  Nsaids and Tylenol  Home Medications   Current Outpatient Rx  Name  Route  Sig  Dispense  Refill  . doxycycline (VIBRAMYCIN) 100 MG capsule   Oral   Take 1 capsule (100 mg total) by mouth 2 (two) times daily.   20 capsule   0   . Lurasidone HCl (LATUDA) 20 MG TABS   Oral   Take 20 mg by mouth at bedtime.         Marland Kitchen  oxyCODONE (OXY IR/ROXICODONE) 5 MG immediate release tablet   Oral   Take 1-4 tablets (5-20 mg total) by mouth every 6 (six) hours as needed for pain.   100 tablet   0   . sertraline (ZOLOFT) 50 MG tablet   Oral   Take 50 mg by mouth daily with breakfast.           BP 145/86  Pulse 91  Temp(Src) 98.4 F (36.9 C) (Oral)  Resp 16  SpO2 100%  LMP 11/22/2012 Physical Exam  Constitutional: She is oriented to person, place, and time. She appears well-developed and well-nourished.  HENT:  Head: Normocephalic.  Mouth/Throat: Oropharyngeal exudate (diffuse white patchy areas and redness) present.  Eyes: Conjunctivae are normal. Pupils are equal, round, and reactive to light.  Neck: Normal range of motion. Neck supple.  Cardiovascular: Normal rate and regular rhythm.   Pulmonary/Chest: Effort normal.  Musculoskeletal: Normal range of  motion.  Lymphadenopathy:    She has no cervical adenopathy.  Neurological: She is alert and oriented to person, place, and time.  Skin: Skin is warm and dry.  Psychiatric: She has a normal mood and affect.    ED Course  Procedures (including critical care time) Labs Reviewed  CULTURE, GROUP A STREP  GONOCOCCUS CULTURE  POCT RAPID STREP A ([facility] ONLY)   No results found. 1. Pharyngitis, acute   likely thrush but will r/o gonococcal infection  MDM  Called in script for magic mouthwash (nystatin, lidocaine, mylanta and diphenhydramine).  Will return in 3-5 days if not better and sooner if needed.   Voices understanding.    Zonia Kief, PA-C 12/07/12 1204

## 2012-12-07 NOTE — ED Notes (Signed)
States she has a sore throat since yesterday.   Patient states she has a white film on her tongue and back of throat.  Patient thought it was her pain medication but she stop for a day and only had a little relief.  States she does have dry mouth a lot lately.

## 2012-12-07 NOTE — ED Provider Notes (Signed)
Medical screening examination/treatment/procedure(s) were performed by non-physician practitioner and as supervising physician I was immediately available for consultation/collaboration.  Raynald Blend, MD 12/07/12 1252

## 2012-12-09 LAB — CULTURE, GROUP A STREP

## 2012-12-09 LAB — GONOCOCCUS CULTURE: Culture: NO GROWTH

## 2012-12-17 ENCOUNTER — Other Ambulatory Visit (INDEPENDENT_AMBULATORY_CARE_PROVIDER_SITE_OTHER): Payer: Self-pay | Admitting: *Deleted

## 2012-12-17 ENCOUNTER — Telehealth (INDEPENDENT_AMBULATORY_CARE_PROVIDER_SITE_OTHER): Payer: Self-pay | Admitting: *Deleted

## 2012-12-17 MED ORDER — OXYCODONE HCL 5 MG PO TABS: 5.0000 mg | ORAL_TABLET | Freq: Four times a day (QID) | ORAL | Status: DC | PRN

## 2012-12-17 NOTE — Telephone Encounter (Signed)
Patient called to ask for a refill of pain medication.  Patient has not had a refill yet however patient is allergic to Tylenol.  Spoke to Dr. Michaell Cowing who approved this refill Oxycodone 5mg  1-2 tablets every 6 hours as needed for pain #30 no refills, signed by Dr. Daphine Deutscher here in the office.  Patient updated and will come to pick up at front desk.

## 2012-12-24 NOTE — Anesthesia Postprocedure Evaluation (Signed)
  Anesthesia Post-op Note  Patient: [name]  Procedure(s) Performed: Procedure(s) (LRB): LAPAROSCOPIC UMBILICAL HERNIA (N/A) INSERTION OF MESH (N/A)  Patient Location: PACU  Anesthesia Type: General  Level of Consciousness: awake and alert   Airway and Oxygen Therapy: Patient Spontanous Breathing  Post-op Pain: mild  Post-op Assessment: Post-op Vital signs reviewed, Patient's Cardiovascular Status Stable, Respiratory Function Stable, Patent Airway and No signs of Nausea or vomiting  Last Vitals:  Filed Vitals:   12/04/12 0513  BP: 155/92  Pulse: 95  Temp: 37.1 C  Resp: 16    Post-op Vital Signs: stable   Complications: No apparent anesthesia complications

## 2012-12-28 ENCOUNTER — Encounter (INDEPENDENT_AMBULATORY_CARE_PROVIDER_SITE_OTHER): Payer: Self-pay | Admitting: General Surgery

## 2012-12-28 ENCOUNTER — Encounter (INDEPENDENT_AMBULATORY_CARE_PROVIDER_SITE_OTHER): Payer: Self-pay

## 2012-12-28 ENCOUNTER — Ambulatory Visit (INDEPENDENT_AMBULATORY_CARE_PROVIDER_SITE_OTHER): Payer: Medicaid Other | Admitting: Internal Medicine

## 2012-12-28 VITALS — BP 156/102 | HR 84 | Temp 99.0°F | Resp 18 | Ht 65.0 in | Wt 297.0 lb

## 2012-12-28 DIAGNOSIS — K430 Incisional hernia with obstruction, without gangrene: Secondary | ICD-10-CM

## 2012-12-28 MED ORDER — OXYCODONE HCL 5 MG PO TABS: 5.0000 mg | ORAL_TABLET | Freq: Four times a day (QID) | ORAL | Status: DC | PRN

## 2012-12-28 NOTE — Patient Instructions (Addendum)
Continue lifting restrictions until August 4th, then may resume regular activity without restrictions Follow up as needed. Call with questions or concerns.

## 2012-12-28 NOTE — Progress Notes (Signed)
  Subjective: Pt returns to the clinic today after being hospitalized for an incarcerated incisional hernia.  The patient underwent laparoscopic repair on 12/02/12.  She is still needing narcotics at time for pain control.  She says she can not use ibuprofen or tylenol as they cause rashes/hives.  She denies n/v or constipation.  Objective: Vital signs in last 24 hours: Reviewed  PE:  Abd: soft, mildly tender, +BS, incision c/d/i  Lab Results:  No results found for this basename: WBC, HGB, HCT, PLT,  in the last 72 hours BMET No results found for this basename: NA, K, CL, CO2, GLUCOSE, BUN, CREATININE, CALCIUM,  in the last 72 hours PT/INR No results found for this basename: LABPROT, INR,  in the last 72 hours CMP     Component Value Date/Time   NA 137 11/20/2012 2025   K 3.3* 11/20/2012 2025   CL 105 11/20/2012 2025   CO2 26 11/20/2012 2025   GLUCOSE 97 11/20/2012 2025   BUN 10 11/20/2012 2025   CREATININE 0.61 11/20/2012 2025   CALCIUM 9.1 11/20/2012 2025   PROT 7.1 11/20/2012 2025   ALBUMIN 3.3* 11/20/2012 2025   AST 13 11/20/2012 2025   ALT 9 11/20/2012 2025   ALKPHOS 58 11/20/2012 2025   BILITOT 0.2* 11/20/2012 2025   GFRNONAA >90 11/20/2012 2025   GFRAA >90 11/20/2012 2025   Lipase     Component Value Date/Time   LIPASE 31 11/20/2012 2025       Studies/Results: No results found.  Anti-infectives: Anti-infectives   None       Assessment/Plan  1.  S/P Incarcerated incisional hernia repair: doing well overall with expected post-op symptoms.  Will refill her pain meds today but advised if they are needed past another week she needs to call to be re-evaluated, she may return to work on August 4th without restrictions.  Follow up as needed.     [name] 12/28/2012

## 2013-01-11 ENCOUNTER — Telehealth (INDEPENDENT_AMBULATORY_CARE_PROVIDER_SITE_OTHER): Payer: Self-pay | Admitting: *Deleted

## 2013-01-11 NOTE — Telephone Encounter (Signed)
Patient called today to state continued pain and asking for refill of pain medication.  Explained to patient that because she has already received 2 refills since her surgery approval would have to be obtained.  Patient had lap umbilical hernia on 12/02/12.  Patient has had 2 refills of the Oxycodone 5mg  since she is allergic to Tylenol.  Awaiting approval at this time.

## 2013-01-11 NOTE — Telephone Encounter (Signed)
Spoke to [place] PA who states patient has already had the prescription at time of surgery and 2 refills since.  Marisue Ivan PA states patient really shouldn't be having pain so bad that she still needs Oxycodone on this time.  Marisue Ivan PA states patient needs to come in to be reassess by Dr. Michaell Cowing who performed the surgery if she feels she still needs this level of pain control.  Appt made for 01/24/13 for patient to see Dr. Michaell Cowing.  Patient instructed to use Ibuprofen until that time or she would need to go to the ED to be assessed if she needs stronger medication than that.  Patient states she can't take Tylenol or Ibuprofen so she said she will probably have to go to the ED because she needs the Oxycodone.  Patient reports a burning and at times sharp pain in her right side.  Explained to patient that this is probably scar tissue that is forming and then when she makes sudden movements she is pulling at the scar tissue.  Patient states understanding but states she still needs to narcotics.  Patient states understanding of the plan at this time and agreeable.

## 2013-01-13 ENCOUNTER — Encounter (HOSPITAL_COMMUNITY): Payer: Self-pay | Admitting: Cardiology

## 2013-01-13 ENCOUNTER — Emergency Department (HOSPITAL_COMMUNITY)
Admission: EM | Admit: 2013-01-13 | Discharge: 2013-01-13 | Disposition: A | Discharge status: Home / Self Care | Payer: Medicaid Other | Attending: Emergency Medicine | Admitting: Emergency Medicine

## 2013-01-13 DIAGNOSIS — G8918 Other acute postprocedural pain: Secondary | ICD-10-CM | POA: Insufficient documentation

## 2013-01-13 DIAGNOSIS — Z8742 Personal history of other diseases of the female genital tract: Secondary | ICD-10-CM | POA: Insufficient documentation

## 2013-01-13 DIAGNOSIS — Z87891 Personal history of nicotine dependence: Secondary | ICD-10-CM | POA: Insufficient documentation

## 2013-01-13 DIAGNOSIS — Z8719 Personal history of other diseases of the digestive system: Secondary | ICD-10-CM | POA: Insufficient documentation

## 2013-01-13 DIAGNOSIS — Z79899 Other long term (current) drug therapy: Secondary | ICD-10-CM | POA: Insufficient documentation

## 2013-01-13 DIAGNOSIS — R109 Unspecified abdominal pain: Secondary | ICD-10-CM | POA: Insufficient documentation

## 2013-01-13 DIAGNOSIS — F329 Major depressive disorder, single episode, unspecified: Secondary | ICD-10-CM | POA: Insufficient documentation

## 2013-01-13 DIAGNOSIS — F3289 Other specified depressive episodes: Secondary | ICD-10-CM | POA: Insufficient documentation

## 2013-01-13 DIAGNOSIS — I10 Essential (primary) hypertension: Secondary | ICD-10-CM | POA: Insufficient documentation

## 2013-01-13 DIAGNOSIS — E669 Obesity, unspecified: Secondary | ICD-10-CM | POA: Insufficient documentation

## 2013-01-13 DIAGNOSIS — F411 Generalized anxiety disorder: Secondary | ICD-10-CM | POA: Insufficient documentation

## 2013-01-13 DIAGNOSIS — Z8619 Personal history of other infectious and parasitic diseases: Secondary | ICD-10-CM | POA: Insufficient documentation

## 2013-01-13 DIAGNOSIS — Z8709 Personal history of other diseases of the respiratory system: Secondary | ICD-10-CM | POA: Insufficient documentation

## 2013-01-13 DIAGNOSIS — Z87440 Personal history of urinary (tract) infections: Secondary | ICD-10-CM | POA: Insufficient documentation

## 2013-01-13 LAB — CBC WITH DIFFERENTIAL/PLATELET
Basophils Absolute: 0 10*3/uL (ref 0.0–0.1)
Basophils Relative: 0 % (ref 0–1)
Eosinophils Absolute: 0.2 10*3/uL (ref 0.0–0.7)
Eosinophils Relative: 2 % (ref 0–5)
HCT: 33 % — ABNORMAL LOW (ref 36.0–46.0)
Hemoglobin: 11.1 g/dL — ABNORMAL LOW (ref 12.0–15.0)
Lymphocytes Relative: 31 % (ref 12–46)
Lymphs Abs: 3.3 10*3/uL (ref 0.7–4.0)
MCH: 27.4 pg (ref 26.0–34.0)
MCHC: 33.6 g/dL (ref 30.0–36.0)
MCV: 81.5 fL (ref 78.0–100.0)
Monocytes Absolute: 0.7 10*3/uL (ref 0.1–1.0)
Monocytes Relative: 6 % (ref 3–12)
Neutro Abs: 6.5 10*3/uL (ref 1.7–7.7)
Neutrophils Relative %: 61 % (ref 43–77)
Platelets: 336 10*3/uL (ref 150–400)
RBC: 4.05 MIL/uL (ref 3.87–5.11)
RDW: 14 % (ref 11.5–15.5)
WBC: 10.6 10*3/uL — ABNORMAL HIGH (ref 4.0–10.5)

## 2013-01-13 LAB — BASIC METABOLIC PANEL
BUN: 7 mg/dL (ref 6–23)
CO2: 25 mEq/L (ref 19–32)
Calcium: 8.7 mg/dL (ref 8.4–10.5)
Chloride: 105 mEq/L (ref 96–112)
Creatinine, Ser: 0.6 mg/dL (ref 0.50–1.10)
GFR calc Af Amer: >90 mL/min (ref >90)
GFR calc non Af Amer: >90 mL/min (ref >90)
Glucose, Bld: 92 mg/dL (ref 70–99)
Potassium: 3.2 mEq/L — ABNORMAL LOW (ref 3.5–5.1)
Sodium: 138 mEq/L (ref 135–145)

## 2013-01-13 MED ORDER — OXYCODONE HCL 5 MG PO TABS: 5.0000 mg | ORAL_TABLET | Freq: Four times a day (QID) | ORAL | Status: DC | PRN

## 2013-01-13 MED ORDER — HYDROCODONE-ACETAMINOPHEN 5-325 MG PO TABS: 1.0000 | ORAL_TABLET | Freq: Four times a day (QID) | ORAL | Status: DC | PRN

## 2013-01-13 MED ORDER — IBUPROFEN 400 MG PO TABS: 400.0000 mg | ORAL_TABLET | Freq: Four times a day (QID) | ORAL | Status: DC | PRN

## 2013-01-13 MED ORDER — DIPHENHYDRAMINE HCL 25 MG PO CAPS: 25.0000 mg | ORAL_CAPSULE | Freq: Four times a day (QID) | ORAL | Status: DC | PRN

## 2013-01-13 NOTE — ED Notes (Signed)
Pt reports that she had hernia surgery about a month ago. States that she is still having pain at the surgical site. Denies any urinary symptoms or n/v. Denies any redness or drainage or fever at home. [facility] preformed the hernia repair.

## 2013-01-13 NOTE — ED Provider Notes (Signed)
CSN: [id number]     Arrival date & time 01/13/13  1513 History     First MD Initiated Contact with Patient 01/13/13 1735     Chief Complaint  Patient presents with  . Abdominal Pain   (Consider location/radiation/quality/duration/timing/severity/associated sxs/prior Treatment) HPI Comments: Pt comes in with cc of abd pain. Pt is s/p hernia repair surgery from about a month ago. States that she has been having abd pain, just lateral to the surgical incision site since 1 week post surgery. The pain only comes about with movement. No n/v/f/c/diarrhea.  Patient is a 27 y.o. female presenting with abdominal pain. The history is provided by the patient.  Abdominal Pain Associated symptoms: no chest pain, no dysuria, no nausea, no shortness of breath and no vomiting     Past Medical History  Diagnosis Date  . Bronchitis   . Hypertension   . Acid reflux   . Pregnancy induced hypertension   . Urinary tract infection   . History of PID   . Abnormal Pap smear     colposcopy, mild dysplasia, HPV  . Gonorrhea   . Chlamydia   . Trichomonas   . Depression     on meds for bipolar  . Anxiety     on meds- stable  . Obesity   . Sleep apnea     hasnt uses CPAP x 4 years  . Fibroid    Past Surgical History  Procedure Laterality Date  . Wisdom tooth extraction    . Foot surgery    . Tubal ligation    . Cesarean section  05/02/03,05/04/08,10/23/10    x3  . Cholecystectomy N/A 10/07/2012    Procedure: LAPAROSCOPIC CHOLECYSTECTOMY WITH INTRAOPERATIVE CHOLANGIOGRAM;  Surgeon: Wilmon Arms. Corliss Skains, MD;  Location: [facility];  Service: General;  Laterality: N/A;  . Umbilical hernia repair N/A 12/02/2012    Procedure: LAPAROSCOPIC UMBILICAL HERNIA;  Surgeon: Ardeth Sportsman, MD;  Location: [facility];  Service: General;  Laterality: N/A;  . Insertion of mesh N/A 12/02/2012    Procedure: INSERTION OF MESH;  Surgeon: Ardeth Sportsman, MD;  Location: [facility];  Service: General;  Laterality: N/A;   Family History   Problem Relation Age of Onset  . Other Neg Hx   . Hypertension Mother   . Heart disease Father   . Stroke Father    History  Substance Use Topics  . Smoking status: Former Smoker -- 0.25 packs/day for 1 years    Types: Cigarettes  . Smokeless tobacco: Never Used  . Alcohol Use: Yes     Comment: socially   OB History   Grav Para Term Preterm Abortions TAB SAB Ect Mult Living   3 3 3       3      Review of Systems  Constitutional: Negative for activity change.  HENT: Negative for neck pain.   Respiratory: Negative for shortness of breath.   Cardiovascular: Negative for chest pain.  Gastrointestinal: Positive for abdominal pain. Negative for nausea and vomiting.  Genitourinary: Negative for dysuria.  Neurological: Negative for headaches.    Allergies  Nsaids and Tylenol  Home Medications   Current Outpatient Rx  Name  Route  Sig  Dispense  Refill  . lurasidone (LATUDA) 40 MG TABS tablet   Oral   Take 40 mg by mouth at bedtime.         . sertraline (ZOLOFT) 50 MG tablet   Oral   Take 50 mg by mouth daily.         Marland Kitchen  oxyCODONE (OXY IR/ROXICODONE) 5 MG immediate release tablet   Oral   Take 1 tablet (5 mg total) by mouth every 6 (six) hours as needed for pain.   15 tablet   0    BP 158/88  Pulse 82  Temp(Src) 97.7 F (36.5 C) (Oral)  Resp 18  Ht 5\' 5"  (1.651 m)  Wt 293 lb (132.904 kg)  BMI 48.76 kg/m2  SpO2 95% Physical Exam  Constitutional: She is oriented to person, place, and time. She appears well-developed and well-nourished.  HENT:  Head: Normocephalic and atraumatic.  Eyes: EOM are normal. Pupils are equal, round, and reactive to light.  Neck: Neck supple.  Cardiovascular: Normal rate, regular rhythm and normal heart sounds.   No murmur heard. Pulmonary/Chest: Effort normal. No respiratory distress.  Abdominal: Soft. She exhibits no distension. There is tenderness. There is no rebound and no guarding.  Tenderness just lateral to the surgical  incision site, with the wound appearing clean. There are no masses, no erythema, no fluctuance.  Neurological: She is alert and oriented to person, place, and time.  Skin: Skin is warm and dry.    ED Course   Procedures (including critical care time)  Labs Reviewed  CBC WITH DIFFERENTIAL - Abnormal; Notable for the following:    WBC 10.6 (*)    Hemoglobin 11.1 (*)    HCT 33.0 (*)    All other components within normal limits  BASIC METABOLIC PANEL - Abnormal; Notable for the following:    Potassium 3.2 (*)    All other components within normal limits   No results found. 1. Abdominal pain     MDM  Pt comes in with cc of abd pain. Exam negative for peritoneal findings, and there is no signs of abscess, large hematoma. Likely scar tissue related - however, has a surgical f.u on 21st, which should be more helpful. No indication for CT, [place].  Derwood Kaplan, MD 01/13/13 2051

## 2013-01-22 ENCOUNTER — Encounter (HOSPITAL_COMMUNITY): Payer: Self-pay | Admitting: *Deleted

## 2013-01-22 ENCOUNTER — Emergency Department (HOSPITAL_COMMUNITY)
Admission: EM | Admit: 2013-01-22 | Discharge: 2013-01-22 | Disposition: A | Discharge status: Home / Self Care | Payer: Medicaid Other | Attending: Emergency Medicine | Admitting: Emergency Medicine

## 2013-01-22 DIAGNOSIS — Z8619 Personal history of other infectious and parasitic diseases: Secondary | ICD-10-CM | POA: Insufficient documentation

## 2013-01-22 DIAGNOSIS — I10 Essential (primary) hypertension: Secondary | ICD-10-CM | POA: Insufficient documentation

## 2013-01-22 DIAGNOSIS — R109 Unspecified abdominal pain: Secondary | ICD-10-CM | POA: Insufficient documentation

## 2013-01-22 DIAGNOSIS — Z79899 Other long term (current) drug therapy: Secondary | ICD-10-CM | POA: Insufficient documentation

## 2013-01-22 DIAGNOSIS — Z8709 Personal history of other diseases of the respiratory system: Secondary | ICD-10-CM | POA: Insufficient documentation

## 2013-01-22 DIAGNOSIS — F319 Bipolar disorder, unspecified: Secondary | ICD-10-CM | POA: Insufficient documentation

## 2013-01-22 DIAGNOSIS — Z8742 Personal history of other diseases of the female genital tract: Secondary | ICD-10-CM | POA: Insufficient documentation

## 2013-01-22 DIAGNOSIS — Z8719 Personal history of other diseases of the digestive system: Secondary | ICD-10-CM | POA: Insufficient documentation

## 2013-01-22 DIAGNOSIS — F411 Generalized anxiety disorder: Secondary | ICD-10-CM | POA: Insufficient documentation

## 2013-01-22 DIAGNOSIS — Z3202 Encounter for pregnancy test, result negative: Secondary | ICD-10-CM | POA: Insufficient documentation

## 2013-01-22 DIAGNOSIS — Z9889 Other specified postprocedural states: Secondary | ICD-10-CM | POA: Insufficient documentation

## 2013-01-22 DIAGNOSIS — Z87891 Personal history of nicotine dependence: Secondary | ICD-10-CM | POA: Insufficient documentation

## 2013-01-22 DIAGNOSIS — Z9851 Tubal ligation status: Secondary | ICD-10-CM | POA: Insufficient documentation

## 2013-01-22 DIAGNOSIS — E669 Obesity, unspecified: Secondary | ICD-10-CM | POA: Insufficient documentation

## 2013-01-22 DIAGNOSIS — Z8669 Personal history of other diseases of the nervous system and sense organs: Secondary | ICD-10-CM | POA: Insufficient documentation

## 2013-01-22 DIAGNOSIS — Z9089 Acquired absence of other organs: Secondary | ICD-10-CM | POA: Insufficient documentation

## 2013-01-22 DIAGNOSIS — Z87440 Personal history of urinary (tract) infections: Secondary | ICD-10-CM | POA: Insufficient documentation

## 2013-01-22 LAB — URINE MICROSCOPIC-ADD ON

## 2013-01-22 LAB — URINALYSIS, ROUTINE W REFLEX MICROSCOPIC
Glucose, UA: NEGATIVE mg/dL
Ketones, ur: NEGATIVE mg/dL
Nitrite: NEGATIVE
Protein, ur: NEGATIVE mg/dL
Specific Gravity, Urine: 1.024 (ref 1.005–1.030)
Urobilinogen, UA: 0.2 mg/dL (ref 0.0–1.0)
pH: 6 (ref 5.0–8.0)

## 2013-01-22 LAB — CBC WITH DIFFERENTIAL/PLATELET
Basophils Absolute: 0 10*3/uL (ref 0.0–0.1)
Basophils Relative: 0 % (ref 0–1)
Eosinophils Absolute: 0.1 10*3/uL (ref 0.0–0.7)
Eosinophils Relative: 1 % (ref 0–5)
HCT: 32.6 % — ABNORMAL LOW (ref 36.0–46.0)
Hemoglobin: 10.9 g/dL — ABNORMAL LOW (ref 12.0–15.0)
Lymphocytes Relative: 30 % (ref 12–46)
Lymphs Abs: 2.9 10*3/uL (ref 0.7–4.0)
MCH: 26.8 pg (ref 26.0–34.0)
MCHC: 33.4 g/dL (ref 30.0–36.0)
MCV: 80.3 fL (ref 78.0–100.0)
Monocytes Absolute: 0.5 10*3/uL (ref 0.1–1.0)
Monocytes Relative: 5 % (ref 3–12)
Neutro Abs: 6.2 10*3/uL (ref 1.7–7.7)
Neutrophils Relative %: 64 % (ref 43–77)
Platelets: 311 10*3/uL (ref 150–400)
RBC: 4.06 MIL/uL (ref 3.87–5.11)
RDW: 13.7 % (ref 11.5–15.5)
WBC: 9.6 10*3/uL (ref 4.0–10.5)

## 2013-01-22 LAB — POCT PREGNANCY, URINE: Preg Test, Ur: NEGATIVE

## 2013-01-22 LAB — COMPREHENSIVE METABOLIC PANEL WITH GFR
ALT: 11 U/L (ref 0–35)
AST: 15 U/L (ref 0–37)
Albumin: 3.4 g/dL — ABNORMAL LOW (ref 3.5–5.2)
Alkaline Phosphatase: 56 U/L (ref 39–117)
BUN: 8 mg/dL (ref 6–23)
CO2: 27 meq/L (ref 19–32)
Calcium: 8.9 mg/dL (ref 8.4–10.5)
Chloride: 103 meq/L (ref 96–112)
Creatinine, Ser: 0.69 mg/dL (ref 0.50–1.10)
GFR calc Af Amer: >90 mL/min
GFR calc non Af Amer: >90 mL/min
Glucose, Bld: 75 mg/dL (ref 70–99)
Potassium: 3 meq/L — ABNORMAL LOW (ref 3.5–5.1)
Sodium: 138 meq/L (ref 135–145)
Total Bilirubin: 0.2 mg/dL — ABNORMAL LOW (ref 0.3–1.2)
Total Protein: 7 g/dL (ref 6.0–8.3)

## 2013-01-22 MED ORDER — OXYCODONE HCL 5 MG PO TABS: 5.0000 mg | ORAL_TABLET | Freq: Four times a day (QID) | ORAL | Status: DC | PRN

## 2013-01-22 NOTE — ED Notes (Signed)
Reports having hernia repair in July but still having a "pulling" discomfort to abd. Denies any n/v/d. No acute distress noted at this time.

## 2013-01-22 NOTE — ED Provider Notes (Signed)
Medical screening examination/treatment/procedure(s) were performed by non-physician practitioner and as supervising physician I was immediately available for consultation/collaboration.   Gavin Pound. Oletta Lamas, MD 01/22/13 [phone]

## 2013-01-22 NOTE — ED Provider Notes (Signed)
CSN: [id number]     Arrival date & time 01/22/13  1645 History     First MD Initiated Contact with Patient 01/22/13 1826     Chief Complaint  Patient presents with  . Abdominal Pain   (Consider location/radiation/quality/duration/timing/severity/associated sxs/prior Treatment) HPI Pt is a 27yo female hx of abdominal hernia repair July 2014. C/o burning and pulling discomfort in abdomen for past 2 weeks.  Was evaluated in ED last week for same.  Was discharged home with pain medication and f/u with surgeon.  Pt states pulling sensation is associated with burning, sharp, stabbing pain, 10/10 at worst, completely resolves why lying in certain positions, made worse when sitting, lying, or standing in certain positions. Has been taking oxycodone for pain wit moderate relief. Denies trauma to area. Denies fever, n/v/d.  Denies dysuria.  Reports normal BMs.  Has f/u appointment scheduled for Monday, 8/18 with general surgery.     Past Medical History  Diagnosis Date  . Bronchitis   . Hypertension   . Acid reflux   . Pregnancy induced hypertension   . Urinary tract infection   . History of PID   . Abnormal Pap smear     colposcopy, mild dysplasia, HPV  . Gonorrhea   . Chlamydia   . Trichomonas   . Depression     on meds for bipolar  . Anxiety     on meds- stable  . Obesity   . Sleep apnea     hasnt uses CPAP x 4 years  . Fibroid    Past Surgical History  Procedure Laterality Date  . Wisdom tooth extraction    . Foot surgery    . Tubal ligation    . Cesarean section  05/02/03,05/04/08,10/23/10    x3  . Cholecystectomy N/A 10/07/2012    Procedure: LAPAROSCOPIC CHOLECYSTECTOMY WITH INTRAOPERATIVE CHOLANGIOGRAM;  Surgeon: Wilmon Arms. Corliss Skains, MD;  Location: [facility];  Service: General;  Laterality: N/A;  . Umbilical hernia repair N/A 12/02/2012    Procedure: LAPAROSCOPIC UMBILICAL HERNIA;  Surgeon: Ardeth Sportsman, MD;  Location: [facility];  Service: General;  Laterality: N/A;  . Insertion of  mesh N/A 12/02/2012    Procedure: INSERTION OF MESH;  Surgeon: Ardeth Sportsman, MD;  Location: [facility];  Service: General;  Laterality: N/A;   Family History  Problem Relation Age of Onset  . Other Neg Hx   . Hypertension Mother   . Heart disease Father   . Stroke Father    History  Substance Use Topics  . Smoking status: Former Smoker -- 0.25 packs/day for 1 years    Types: Cigarettes  . Smokeless tobacco: Never Used  . Alcohol Use: Yes     Comment: socially   OB History   Grav Para Term Preterm Abortions TAB SAB Ect Mult Living   3 3 3       3      Review of Systems  Constitutional: Negative for fever, chills, appetite change and unexpected weight change.  Gastrointestinal: Positive for abdominal pain. Negative for nausea, vomiting, diarrhea and constipation.  All other systems reviewed and are negative.    Allergies  Nsaids and Tylenol  Home Medications   Current Outpatient Rx  Name  Route  Sig  Dispense  Refill  . lurasidone (LATUDA) 40 MG TABS tablet   Oral   Take 40 mg by mouth at bedtime.         . sertraline (ZOLOFT) 50 MG tablet   Oral  Take 50 mg by mouth daily.         Marland Kitchen oxyCODONE (OXY IR/ROXICODONE) 5 MG immediate release tablet   Oral   Take 1 tablet (5 mg total) by mouth every 6 (six) hours as needed for pain.   15 tablet   0    BP 154/98  Pulse 87  Temp(Src) 98.8 F (37.1 C) (Oral)  Resp 20  SpO2 100%  LMP 01/15/2013 Physical Exam  Nursing note and vitals reviewed. Constitutional: She appears well-developed and well-nourished. No distress.  HENT:  Head: Normocephalic and atraumatic.  Eyes: Conjunctivae are normal. No scleral icterus.  Neck: Normal range of motion.  Cardiovascular: Normal rate, regular rhythm and normal heart sounds.   Pulmonary/Chest: Effort normal and breath sounds normal. No respiratory distress. She has no wheezes. She has no rales. She exhibits no tenderness.  Abdominal: Soft. Bowel sounds are normal. She  exhibits no distension and no mass. There is tenderness. There is no rebound and no guarding.   Body habitus limits physical exam.  Well healed vertical scar above umbilicus.  TTP right of surgical incision.   Musculoskeletal: Normal range of motion.  Neurological: She is alert.  Skin: Skin is warm and dry. She is not diaphoretic.    ED Course   Procedures (including critical care time)  Labs Reviewed  COMPREHENSIVE METABOLIC PANEL - Abnormal; Notable for the following:    Potassium 3.0 (*)    Albumin 3.4 (*)    Total Bilirubin 0.2 (*)    All other components within normal limits  CBC WITH DIFFERENTIAL - Abnormal; Notable for the following:    Hemoglobin 10.9 (*)    HCT 32.6 (*)    All other components within normal limits  URINALYSIS, ROUTINE W REFLEX MICROSCOPIC - Abnormal; Notable for the following:    APPearance CLOUDY (*)    Hgb urine dipstick SMALL (*)    Bilirubin Urine SMALL (*)    Leukocytes, UA SMALL (*)    All other components within normal limits  URINE MICROSCOPIC-ADD ON - Abnormal; Notable for the following:    Squamous Epithelial / LPF FEW (*)    All other components within normal limits  POCT PREGNANCY, URINE   No results found. 1. Abdominal pain     MDM  Pt reevaluated for pain and pulling discomfort in abdomen after hernia repair.  Denies fever, n/v/d.  PE: pt is tender right of surgical scar, however, no mass appreciated, no rebound or guarding.  Negative for peritoneal findings.  No signs of abscess or large hematoma. No indication for U/S or CT.  Discussed pt with Dr. Oletta Lamas.  Discomfort and pulling likely due to scar tissue.  Pt encouraged to f/u as scheduled Monday, 8/18 for general surgery recheck.  Pt verbalized agreement and understanding with tx plan.    Junius Finner, PA-C 01/22/13 1900

## 2013-01-22 NOTE — ED Notes (Signed)
Pt had hernia repair at the end of July 2014. Pt came to the ED because she is still having a burning and pulling discomfort. Pt stated that she is unable to take Tylenol and IB Profen. She stated that Tramadol does not work. No n/v. No loss of appetite. No cardiac or respiratory distress. Will continue to monitor.

## 2013-01-24 ENCOUNTER — Encounter (INDEPENDENT_AMBULATORY_CARE_PROVIDER_SITE_OTHER): Payer: Medicaid Other | Admitting: Surgery

## 2013-01-27 ENCOUNTER — Telehealth (INDEPENDENT_AMBULATORY_CARE_PROVIDER_SITE_OTHER): Payer: Self-pay | Admitting: *Deleted

## 2013-01-27 MED ORDER — OXYCODONE HCL 5 MG PO TABS
5.0000 mg | ORAL_TABLET | Freq: Four times a day (QID) | ORAL | Status: DC | PRN
Start: 2013-01-27 — End: 2013-02-22

## 2013-01-27 MED ORDER — METHOCARBAMOL 750 MG PO TABS: 750.0000 mg | ORAL_TABLET | Freq: Four times a day (QID) | ORAL | Status: DC | PRN

## 2013-01-27 NOTE — Telephone Encounter (Signed)
  Oxycodone 5mg  #40 one to two by mouth every 6 hours when necessary.  Also offer Robaxin 750 mg by mouth every 6 hours when necessary spasms #20 with two refills.  No more refills unless I seen in office

## 2013-01-27 NOTE — Telephone Encounter (Signed)
Prescription for Oxycodone was written out and signed by Dr. Abbey Chatters since Dr. Michaell Cowing is not in the office.  Prescription was placed at the front desk for patient to pick up tomorrow.  The prescription for Robaxin was escribed to the patient's pharmacy at this time.

## 2013-01-27 NOTE — Telephone Encounter (Signed)
Patient called in today requesting a refill of her Oxycodone.  Patient states continued abdominal pain at her incision site.  Patient had an umbilical hernia repair on 12/02/12.  Patient was given prescription for Oxycodone IR 5mg  #30 at discharge.  Patient then called in on 12/17/12 and Marisue Ivan PA approved Oxycodone IR 5mg  #30.  Patient came in on 12/28/12 to [facility] and was given another prescription by Marisue Ivan PA for Oxycodone IR 5mg  #60.  Marisue Ivan PA documented the incision site to be healing well and no complications.  Patient then called in on 01/11/13 asking for another refill, spoke to [place] PA who states that if patient is still having pain at this time she would need to be reassessed by Dr. Michaell Cowing.  Marisue Ivan PA stated she did not feel comfortable approving another prescription at this time without patient being assessed.  Marisue Ivan PA instructed that patient should try Tylenol or Ibuprofen until her appt with Dr. Michaell Cowing.  Patient reported to this RN that she can't take Tylenol and Ibuprofen so she would go to the ED to get prescription for Oxycodone.  Patient went to ED on 01/13/13 and 01/22/13 at which patient was prescribed Oxycodone IR 5mg  #15 each time.  Lab work was done in the ED on 01/22/13 which appeared normal, ED felt there was no indication to perform radiology.  Patient had an appt to see Dr. Michaell Cowing on 01/24/13 but was a No Show for the appt.  Patient then called and rescheduled appt to 01/31/13 to see Dr. Michaell Cowing.  Explained to patient that Marisue Ivan PA no longer feels comfortable prescribing medications for her and asked if a message can be sent to Dr. Michaell Cowing to ask for approval for Oxycodone IR 5mg .  Explained that a message will be sent for his decision.  Patient states understanding and agreeable with plan at this time.

## 2013-01-31 ENCOUNTER — Encounter (INDEPENDENT_AMBULATORY_CARE_PROVIDER_SITE_OTHER): Payer: Medicaid Other | Admitting: Surgery

## 2013-02-02 ENCOUNTER — Encounter (INDEPENDENT_AMBULATORY_CARE_PROVIDER_SITE_OTHER): Payer: Medicaid Other | Admitting: Surgery

## 2013-02-08 ENCOUNTER — Encounter (INDEPENDENT_AMBULATORY_CARE_PROVIDER_SITE_OTHER): Payer: Medicaid Other | Admitting: Surgery

## 2013-02-10 ENCOUNTER — Telehealth (INDEPENDENT_AMBULATORY_CARE_PROVIDER_SITE_OTHER): Payer: Self-pay | Admitting: General Surgery

## 2013-02-10 NOTE — Telephone Encounter (Signed)
Pt called to ask about pain she is having on the Rt side at the site of her surgery (had lap chole 10/07/12.)  She states is transiently feels like it "is ripping in there."  Reassured pt that she is most likely feeling scar tissue as it is forming in the areas that were surgically cut.  Stated she might have noticed the same sensations at [employer] area, and she agreed she had them but not as bad.  She seemed relieved to learn what is going on.  Since she states she is allergic to NSAIDs and Tylenol (hives) suggested she apply an ice pack to the site for comfort.  No narcotics would be recommended after 4 months from surgery.  She understands.

## 2013-02-22 ENCOUNTER — Ambulatory Visit (INDEPENDENT_AMBULATORY_CARE_PROVIDER_SITE_OTHER): Payer: Medicaid Other | Admitting: Surgery

## 2013-02-22 ENCOUNTER — Encounter (INDEPENDENT_AMBULATORY_CARE_PROVIDER_SITE_OTHER): Payer: Self-pay | Admitting: Surgery

## 2013-02-22 VITALS — BP 182/94 | HR 80 | Resp 20 | Ht 65.0 in | Wt 297.2 lb

## 2013-02-22 DIAGNOSIS — K8010 Calculus of gallbladder with chronic cholecystitis without obstruction: Secondary | ICD-10-CM

## 2013-02-22 DIAGNOSIS — K430 Incisional hernia with obstruction, without gangrene: Secondary | ICD-10-CM

## 2013-02-22 DIAGNOSIS — I10 Essential (primary) hypertension: Secondary | ICD-10-CM

## 2013-02-22 MED ORDER — METHOCARBAMOL 750 MG PO TABS: 750.0000 mg | ORAL_TABLET | Freq: Four times a day (QID) | ORAL | Status: DC | PRN

## 2013-02-22 MED ORDER — OXYCODONE HCL 5 MG PO TABS: 5.0000 mg | ORAL_TABLET | Freq: Four times a day (QID) | ORAL | Status: DC | PRN

## 2013-02-22 NOTE — Progress Notes (Signed)
Subjective:     Patient ID: [name], female   DOB: 1986/04/17, 27 y.o.   MRN: [id number]  HPI  [name]  03/25/1986 [id number]  Patient Care Team: Renaye Rakers, MD as PCP - General (Family Medicine)  Procedure (Date: 12/02/2012):  POST-OPERATIVE DIAGNOSIS: Incarcerated Incisional hernia   PROCEDURE: Procedure(s):  LAPAROSCOPIC repair of incarcerated incisional hernia  INSERTION OF MESH  SURGEON: Surgeon(s):  Ardeth Sportsman, MD   This patient returns for surgical re-evaluation.  Morbidly obese female with incisional hernia status post urgent cholecystectomy.  Cholecystectomy in May.  Hernia repair done six weeks ago.  The patient still struggles with pain.  Ran out of narcotics.  Cannot tolerate Tylenol or nonsteroidals to two hives and allergic reactions.  Was using a binder but then was told to stop.  Trying to take care of three kids.  Did not know a muscle relaxant was called and so never had it filled.  Patient Active Problem List   Diagnosis Date Noted  . Incarcerated incisional hernia s/p lap repair 12/02/2012 12/03/2012  . Hypertension   . Acid reflux   . Anxiety   . Obesity, Class III, BMI 40-49.9 (morbid obesity)   . Sleep apnea   . Chronic cholecystitis with calculus s/p lap chole [id number] 10/04/2012    Past Medical History  Diagnosis Date  . Bronchitis   . Hypertension   . Acid reflux   . Pregnancy induced hypertension   . Urinary tract infection   . History of PID   . Abnormal Pap smear     colposcopy, mild dysplasia, HPV  . Gonorrhea   . Chlamydia   . Trichomonas   . Depression     on meds for bipolar  . Anxiety     on meds- stable  . Obesity   . Sleep apnea     hasnt uses CPAP x 4 years  . Fibroid     Past Surgical History  Procedure Laterality Date  . Wisdom tooth extraction    . Foot surgery    . Tubal ligation    . Cesarean section  05/02/03,05/04/08,10/23/10    x3  . Cholecystectomy N/A 10/07/2012    Procedure: LAPAROSCOPIC  CHOLECYSTECTOMY WITH INTRAOPERATIVE CHOLANGIOGRAM;  Surgeon: Wilmon Arms. Corliss Skains, MD;  Location: [facility];  Service: General;  Laterality: N/A;  . Umbilical hernia repair N/A 12/02/2012    Procedure: LAPAROSCOPIC UMBILICAL HERNIA;  Surgeon: Ardeth Sportsman, MD;  Location: [facility];  Service: General;  Laterality: N/A;  . Insertion of mesh N/A 12/02/2012    Procedure: INSERTION OF MESH;  Surgeon: Ardeth Sportsman, MD;  Location: [facility];  Service: General;  Laterality: N/A;    History   Social History  . Marital Status: Single    Spouse Name: N/A    Number of Children: N/A  . Years of Education: N/A   Occupational History  . Not on file.   Social History Main Topics  . Smoking status: Former Smoker -- 0.25 packs/day for 1 years    Types: Cigarettes  . Smokeless tobacco: Never Used  . Alcohol Use: Yes     Comment: socially  . Drug Use: No  . Sexual Activity: Yes    Birth Control/ Protection: Surgical     Comment: Last intercourse 2 nights ago.   Other Topics Concern  . Not on file   Social History Narrative  . No narrative on file    Family History  Problem Relation Age of  Onset  . Other Neg Hx   . Hypertension Mother   . Heart disease Father   . Stroke Father     Current Outpatient Prescriptions  Medication Sig Dispense Refill  . lurasidone (LATUDA) 40 MG TABS tablet Take 40 mg by mouth at bedtime.      . sertraline (ZOLOFT) 50 MG tablet Take 50 mg by mouth daily.      . methocarbamol (ROBAXIN-750) 750 MG tablet Take 1 tablet (750 mg total) by mouth every 6 (six) hours as needed (For spasms).  40 tablet  1  . oxyCODONE (OXY IR/ROXICODONE) 5 MG immediate release tablet Take 1-2 tablets (5-10 mg total) by mouth every 6 (six) hours as needed for pain.  40 tablet  0   No current facility-administered medications for this visit.     Allergies  Allergen Reactions  . Nsaids Hives, Itching and Rash  . Tylenol [Acetaminophen] Hives, Itching and Rash    BP 182/94  Pulse 80   Resp 20  Ht 5\' 5"  (1.651 m)  Wt 297 lb 3.2 oz (134.809 kg)  BMI 49.46 kg/m2  No results found.   Review of Systems  Constitutional: Negative for fever, chills and diaphoresis.  HENT: Negative for ear pain, sore throat and trouble swallowing.   Eyes: Negative for photophobia and visual disturbance.  Respiratory: Negative for cough and choking.   Cardiovascular: Negative for chest pain and palpitations.  Gastrointestinal: Positive for abdominal pain. Negative for nausea, vomiting, diarrhea, constipation, blood in stool, anal bleeding and rectal pain.  Genitourinary: Negative for dysuria, frequency and difficulty urinating.  Musculoskeletal: Negative for myalgias and gait problem.  Skin: Negative for color change, pallor and rash.  Neurological: Negative for dizziness, speech difficulty, weakness and numbness.  Hematological: Negative for adenopathy.  Psychiatric/Behavioral: Negative for confusion and agitation. The patient is not nervous/anxious.        Objective:   Physical Exam  Constitutional: She is oriented to person, place, and time. She appears well-developed and well-nourished. No distress.  HENT:  Head: Normocephalic.  Mouth/Throat: Oropharynx is clear and moist. No oropharyngeal exudate.  Eyes: Conjunctivae and EOM are normal. Pupils are equal, round, and reactive to light. No scleral icterus.  Neck: Normal range of motion. Neck supple. No tracheal deviation present.  Cardiovascular: Normal rate, regular rhythm and intact distal pulses.   Pulmonary/Chest: Effort normal and breath sounds normal. No stridor. No respiratory distress. She exhibits no tenderness.  Abdominal: Soft. She exhibits no distension and no mass. There is no rebound and no guarding. Hernia confirmed negative in the right inguinal area and confirmed negative in the left inguinal area.  Mild soreness on bilateral flanks not severe.  Incisions clean with normal healing ridges.  No hernias  Genitourinary: No  vaginal discharge found.  Musculoskeletal: Normal range of motion. She exhibits no tenderness.       Right elbow: She exhibits normal range of motion.       Left elbow: She exhibits normal range of motion.       Right wrist: She exhibits normal range of motion.       Left wrist: She exhibits normal range of motion.       Right hand: Normal strength noted.       Left hand: Normal strength noted.  Lymphadenopathy:       Head (right side): No posterior auricular adenopathy present.       Head (left side): No posterior auricular adenopathy present.    She has  no cervical adenopathy.    She has no axillary adenopathy.       Right: No inguinal adenopathy present.       Left: No inguinal adenopathy present.  Neurological: She is alert and oriented to person, place, and time. No cranial nerve deficit. She exhibits normal muscle tone. Coordination normal.  Skin: Skin is warm and dry. No rash noted. She is not diaphoretic. No erythema.  Psychiatric: She has a normal mood and affect. Her behavior is normal. Judgment and thought content normal.       Assessment:     Six weeks status post urgent ventral wall incisional hernia repair with mesh struggling with soreness.     Plan:     I again went over the indications for the emergency surgery and the technique.  Handouts given as well.  I noted to the patient is not suppressed is still struggle with soreness especially in the setting of an emergency surgery and in the setting of obesity.  The vast majority of these aches and pains resolve by three months.  She is had made a lot of improvements in the past month already, so I am hopeful that she will get better.  Improve her pain control.  Start using the binder again.    Use heating pad six times a day.  I renewed a new prescription for the Robaxin muscle relaxant.  Also some more oxycodone #40.  That should help.  Increase activity as tolerated to regular activity.  Low impact exercise such as  walking an hour a day at least ideal.  Do not push through pain.  Diet as tolerated.  Low fat high fiber diet ideal.  Bowel regimen with 30 g fiber a day and fiber supplement as needed to avoid problems.  Return to clinic as needed.   Instructions discussed.  Followup with primary care physician for other health issues as would normally be done.  Questions answered.  The patient expressed understanding and appreciation

## 2013-02-22 NOTE — Patient Instructions (Signed)
HERNIA REPAIR: POST OP INSTRUCTIONS  1. DIET: Follow a light bland diet the first 24 hours after arrival home, such as soup, liquids, crackers, etc.  Be sure to include lots of fluids daily.  Avoid fast food or heavy meals as your are more likely to get nauseated.  Eat a low fat the next few days after surgery. 2. Take your usually prescribed home medications unless otherwise directed. 3. PAIN CONTROL: a. Pain is best controlled by a usual combination of three different methods TOGETHER: i. Ice/Heat ii. Over the counter pain medication iii. Prescription pain medication b. Most patients will experience some swelling and bruising around the hernia(s) such as the bellybutton, groins, or old incisions.  Ice packs or heating pads (30-60 minutes up to 6 times a day) will help. Use ice for the first few days to help decrease swelling and bruising, then switch to heat to help relax tight/sore spots and speed recovery.  Some people prefer to use ice alone, heat alone, alternating between ice & heat.  Experiment to what works for you.  Swelling and bruising can take several weeks to resolve.   c. It is helpful to take an over-the-counter pain medication regularly for the first few weeks.  Choose one of the following that works best for you: i. Naproxen (Aleve, etc)  Two 220mg tabs twice a day ii. Ibuprofen (Advil, etc) Three 200mg tabs four times a day (every meal & bedtime) iii. Acetaminophen (Tylenol, etc) 325-650mg four times a day (every meal & bedtime) d. A  prescription for pain medication should be given to you upon discharge.  Take your pain medication as prescribed.  i. If you are having problems/concerns with the prescription medicine (does not control pain, nausea, vomiting, rash, itching, etc), please call us [phone] to see if we need to switch you to a different pain medicine that will work better for you and/or control your side effect better. ii. If you need a refill on your pain  medication, please contact your pharmacy.  They will contact our office to request authorization. Prescriptions will not be filled after 5 pm or on week-ends. 4. Avoid getting constipated.  Between the surgery and the pain medications, it is common to experience some constipation.  Increasing fluid intake and taking a fiber supplement (such as Metamucil, Citrucel, FiberCon, MiraLax, etc) 1-2 times a day regularly will usually help prevent this problem from occurring.  A mild laxative (prune juice, Milk of Magnesia, MiraLax, etc) should be taken according to package directions if there are no bowel movements after 48 hours.   5. Wash / shower every day.  You may shower over the dressings as they are waterproof.   6. Remove your waterproof bandages 5 days after surgery.  You may leave the incision open to air.  You may replace a dressing/Band-Aid to cover the incision for comfort if you wish.  Continue to shower over incision(s) after the dressing is off.    7. ACTIVITIES as tolerated:   a. You may resume regular (light) daily activities beginning the next day-such as daily self-care, walking, climbing stairs-gradually increasing activities as tolerated.  If you can walk 30 minutes without difficulty, it is safe to try more intense activity such as jogging, treadmill, bicycling, low-impact aerobics, swimming, etc. b. Save the most intensive and strenuous activity for last such as sit-ups, heavy lifting, contact sports, etc  Refrain from any heavy lifting or straining until you are off narcotics for pain control.     c. DO NOT PUSH THROUGH PAIN.  Let pain be your guide: If it hurts to do something, don't do it.  Pain is your body warning you to avoid that activity for another week until the pain goes down. d. You may drive when you are no longer taking prescription pain medication, you can comfortably wear a seatbelt, and you can safely maneuver your car and apply brakes. e. You may have sexual intercourse  when it is comfortable.  8. FOLLOW UP in our office a. Please call [facility] at [phone] to set up an appointment to see your surgeon in the office for a follow-up appointment approximately 2-3 weeks after your surgery. b. Make sure that you call for this appointment the day you arrive home to insure a convenient appointment time. 9.  IF YOU HAVE DISABILITY OR FAMILY LEAVE FORMS, BRING THEM TO THE OFFICE FOR PROCESSING.  DO NOT GIVE THEM TO YOUR DOCTOR.  WHEN TO CALL US [phone]: 1. Poor pain control 2. Reactions / problems with new medications (rash/itching, nausea, etc)  3. Fever over 101.5 F (38.5 C) 4. Inability to urinate 5. Nausea and/or vomiting 6. Worsening swelling or bruising 7. Continued bleeding from incision. 8. Increased pain, redness, or drainage from the incision   The clinic staff is available to answer your questions during regular business hours (8:30am-5pm).  Please don't hesitate to call and ask to speak to one of our nurses for clinical concerns.   If you have a medical emergency, go to the nearest emergency room or call 911.  A surgeon from [facility] is always on call at the hospitals in [place]  [facility] [address], [address], [place], [place]  [address] ?  [address] [address], [place], [place]   [address] MAIN: [phone] ? TOLL FREE: [phone] ? FAX: [phone] [email or web address]  Managing Pain  Pain after surgery or related to activity is often due to strain/injury to muscle, tendon, nerves and/or incisions.  This pain is usually short-term and will improve in a few months.   Many people find it helpful to do the following things TOGETHER to help speed the process of healing and to get back to regular activity more quickly:  1. Avoid heavy physical activity a.  no lifting greater than 20 pounds b. Do not "push through" the pain.  Listen to your body and avoid positions and maneuvers than  reproduce the pain c. Walking is okay as tolerated, but go slowly and stop when getting sore.  d. Remember: If it hurts to do it, then don't do it! 2. Take Anti-inflammatory medication  a. Take with food/snack around the clock for 1-2 weeks i. This helps the muscle and nerve tissues become less irritable and calm down faster b. Choose ONE of the following over-the-counter medications: i. Naproxen 220mg tabs (ex. Aleve) 1-2 pills twice a day  ii. Ibuprofen 200mg tabs (ex. Advil, Motrin) 3-4 pills with every meal and just before bedtime iii. Acetaminophen 500mg tabs (Tylenol) 1-2 pills with every meal and just before bedtime 3. Use a Heating pad or Ice/Cold Pack a. 4-6 times a day b. May use warm bath/hottub  or showers 4. Try Gentle Massage and/or Stretching  a. at the area of pain many times a day b. stop if you feel pain - do not overdo it  Try these steps together to help you body heal faster and avoid making things get worse.  Doing just one of these   things may not be enough.    If you are not getting better after two weeks or are noticing you are getting worse, contact our office for further advice; we may need to re-evaluate you & see what other things we can do to help.  Hernia A hernia occurs when an internal organ pushes out through a weak spot in the abdominal wall. Hernias most commonly occur in the groin and around the navel. Hernias often can be pushed back into place (reduced). Most hernias tend to get worse over time. Some abdominal hernias can get stuck in the opening (irreducible or incarcerated hernia) and cannot be reduced. An irreducible abdominal hernia which is tightly squeezed into the opening is at risk for impaired blood supply (strangulated hernia). A strangulated hernia is a medical emergency. Because of the risk for an irreducible or strangulated hernia, surgery may be recommended to repair a hernia. CAUSES   Heavy lifting.  Prolonged coughing.  Straining to  have a bowel movement.  A cut (incision) made during an abdominal surgery. HOME CARE INSTRUCTIONS   Bed rest is not required. You may continue your normal activities.  Avoid lifting more than 10 pounds (4.5 kg) or straining.  Cough gently. If you are a smoker it is best to stop. Even the best hernia repair can break down with the continual strain of coughing. Even if you do not have your hernia repaired, a cough will continue to aggravate the problem.  Do not wear anything tight over your hernia. Do not try to keep it in with an outside bandage or truss. These can damage abdominal contents if they are trapped within the hernia sac.  Eat a normal diet.  Avoid constipation. Straining over long periods of time will increase hernia size and encourage breakdown of repairs. If you cannot do this with diet alone, stool softeners may be used. SEEK IMMEDIATE MEDICAL CARE IF:   You have a fever.  You develop increasing abdominal pain.  You feel nauseous or vomit.  Your hernia is stuck outside the abdomen, looks discolored, feels hard, or is tender.  You have any changes in your bowel habits or in the hernia that are unusual for you.  You have increased pain or swelling around the hernia.  You cannot push the hernia back in place by applying gentle pressure while lying down. MAKE SURE YOU:   Understand these instructions.  Will watch your condition.  Will get help right away if you are not doing well or get worse. Document Released: 05/26/2005 Document Revised: 08/18/2011 Document Reviewed: 01/13/2008 [employer] Patient Information 2014 [employer].  

## 2013-03-16 ENCOUNTER — Encounter (INDEPENDENT_AMBULATORY_CARE_PROVIDER_SITE_OTHER): Payer: Medicaid Other | Admitting: Surgery

## 2013-03-28 ENCOUNTER — Encounter (INDEPENDENT_AMBULATORY_CARE_PROVIDER_SITE_OTHER): Payer: Medicaid Other | Admitting: Surgery

## 2013-03-30 ENCOUNTER — Encounter (INDEPENDENT_AMBULATORY_CARE_PROVIDER_SITE_OTHER): Payer: Self-pay | Admitting: Surgery

## 2013-04-14 ENCOUNTER — Other Ambulatory Visit: Payer: Self-pay

## 2013-06-15 ENCOUNTER — Encounter (HOSPITAL_COMMUNITY): Payer: Self-pay | Admitting: Emergency Medicine

## 2013-06-15 ENCOUNTER — Emergency Department (HOSPITAL_COMMUNITY)
Admission: EM | Admit: 2013-06-15 | Discharge: 2013-06-15 | Disposition: A | Discharge status: Home / Self Care | Payer: Medicaid Other | Attending: Emergency Medicine | Admitting: Emergency Medicine

## 2013-06-15 DIAGNOSIS — Z87891 Personal history of nicotine dependence: Secondary | ICD-10-CM | POA: Insufficient documentation

## 2013-06-15 DIAGNOSIS — E669 Obesity, unspecified: Secondary | ICD-10-CM | POA: Insufficient documentation

## 2013-06-15 DIAGNOSIS — H5789 Other specified disorders of eye and adnexa: Secondary | ICD-10-CM | POA: Insufficient documentation

## 2013-06-15 DIAGNOSIS — H9209 Otalgia, unspecified ear: Secondary | ICD-10-CM | POA: Insufficient documentation

## 2013-06-15 DIAGNOSIS — J329 Chronic sinusitis, unspecified: Secondary | ICD-10-CM

## 2013-06-15 DIAGNOSIS — Z8619 Personal history of other infectious and parasitic diseases: Secondary | ICD-10-CM | POA: Insufficient documentation

## 2013-06-15 DIAGNOSIS — Z87440 Personal history of urinary (tract) infections: Secondary | ICD-10-CM | POA: Insufficient documentation

## 2013-06-15 DIAGNOSIS — R040 Epistaxis: Secondary | ICD-10-CM | POA: Insufficient documentation

## 2013-06-15 DIAGNOSIS — F3289 Other specified depressive episodes: Secondary | ICD-10-CM | POA: Insufficient documentation

## 2013-06-15 DIAGNOSIS — Z79899 Other long term (current) drug therapy: Secondary | ICD-10-CM | POA: Insufficient documentation

## 2013-06-15 DIAGNOSIS — F329 Major depressive disorder, single episode, unspecified: Secondary | ICD-10-CM | POA: Insufficient documentation

## 2013-06-15 DIAGNOSIS — Z8742 Personal history of other diseases of the female genital tract: Secondary | ICD-10-CM | POA: Insufficient documentation

## 2013-06-15 DIAGNOSIS — F411 Generalized anxiety disorder: Secondary | ICD-10-CM | POA: Insufficient documentation

## 2013-06-15 DIAGNOSIS — I10 Essential (primary) hypertension: Secondary | ICD-10-CM | POA: Insufficient documentation

## 2013-06-15 DIAGNOSIS — Z8719 Personal history of other diseases of the digestive system: Secondary | ICD-10-CM | POA: Insufficient documentation

## 2013-06-15 DIAGNOSIS — Z8709 Personal history of other diseases of the respiratory system: Secondary | ICD-10-CM | POA: Insufficient documentation

## 2013-06-15 MED ORDER — OXYMETAZOLINE HCL 0.05 % NA SOLN: 1.0000 | Freq: Two times a day (BID) | NASAL | Status: DC

## 2013-06-15 MED ORDER — PSEUDOEPHEDRINE HCL 60 MG PO TABS: 60.0000 mg | ORAL_TABLET | Freq: Four times a day (QID) | ORAL | Status: DC | PRN

## 2013-06-15 MED ORDER — SALINE SPRAY 0.65 % NA SOLN: 1.0000 | NASAL | Status: DC | PRN

## 2013-06-15 NOTE — ED Provider Notes (Signed)
CSN: [id number]     Arrival date & time 06/15/13  0626 History  This chart was scribed for non-physician practitioner, Cleatrice Burke, PA-C working with Houston Siren, MD by Frederich Balding, ED scribe. This patient was seen in room TR09C/TR09C and the patient's care was started at 10:11 AM.   Chief Complaint  Patient presents with  . Nasal Congestion  . Facial Pain   The history is provided by the patient. No language interpreter was used.   HPI Comments: [name] is a 28 y.o. female who presents to the Emergency Department complaining of dull headache, nasal congestion and sinus pressure that started 3 days ago. She states she has been blowing out blood clots when she blows her nose. Leaning forward and backwards worsens the headache. She states she is also having bilateral ear pain and eye redness. Pt has taken Tylenol with no relief. Denies fever, cough, SOB, abdominal pain.   Past Medical History  Diagnosis Date  . Bronchitis   . Hypertension   . Acid reflux   . Pregnancy induced hypertension   . Urinary tract infection   . History of PID   . Abnormal Pap smear     colposcopy, mild dysplasia, HPV  . Gonorrhea   . Chlamydia   . Trichomonas   . Depression     on meds for bipolar  . Anxiety     on meds- stable  . Obesity   . Sleep apnea     hasnt uses CPAP x 4 years  . Fibroid    Past Surgical History  Procedure Laterality Date  . Wisdom tooth extraction    . Foot surgery    . Tubal ligation    . Cesarean section  05/02/03,05/04/08,10/23/10    x3  . Cholecystectomy N/A 10/07/2012    Procedure: LAPAROSCOPIC CHOLECYSTECTOMY WITH INTRAOPERATIVE CHOLANGIOGRAM;  Surgeon: Imogene Burn. Georgette Dover, MD;  Location: [facility];  Service: General;  Laterality: N/A;  . Umbilical hernia repair N/A 12/02/2012    Procedure: LAPAROSCOPIC UMBILICAL HERNIA;  Surgeon: Adin Hector, MD;  Location: [facility];  Service: General;  Laterality: N/A;  . Insertion of mesh N/A 12/02/2012    Procedure:  INSERTION OF MESH;  Surgeon: Adin Hector, MD;  Location: [facility];  Service: General;  Laterality: N/A;   Family History  Problem Relation Age of Onset  . Other Neg Hx   . Hypertension Mother   . Heart disease Father   . Stroke Father    History  Substance Use Topics  . Smoking status: Former Smoker -- 0.25 packs/day for 1 years    Types: Cigarettes  . Smokeless tobacco: Never Used  . Alcohol Use: Yes     Comment: socially   OB History   Grav Para Term Preterm Abortions TAB SAB Ect Mult Living   3 3 3       3      Review of Systems  Constitutional: Negative for fever.  HENT: Positive for congestion, ear pain and sinus pressure.   Eyes: Positive for redness.  Respiratory: Negative for cough and shortness of breath.   Gastrointestinal: Negative for abdominal pain.  Neurological: Positive for headaches.  All other systems reviewed and are negative.    Allergies  Nsaids and Tylenol  Home Medications   Current Outpatient Rx  Name  Route  Sig  Dispense  Refill  . lurasidone (LATUDA) 40 MG TABS tablet   Oral   Take 40 mg by mouth at bedtime.         Marland Kitchen  methocarbamol (ROBAXIN-750) 750 MG tablet   Oral   Take 1 tablet (750 mg total) by mouth every 6 (six) hours as needed (For spasms).   40 tablet   1   . oxyCODONE (OXY IR/ROXICODONE) 5 MG immediate release tablet   Oral   Take 1-2 tablets (5-10 mg total) by mouth every 6 (six) hours as needed for pain.   40 tablet   0   . sertraline (ZOLOFT) 50 MG tablet   Oral   Take 50 mg by mouth daily.          BP 161/100  Pulse 75  Temp(Src) 98.3 F (36.8 C) (Oral)  Resp 18  SpO2 96%  Physical Exam  Nursing note and vitals reviewed. Constitutional: She is oriented to person, place, and time. She appears well-developed and well-nourished. No distress.  HENT:  Head: Normocephalic and atraumatic.  Right Ear: Tympanic membrane, external ear and ear canal normal.  Left Ear: Tympanic membrane, external ear and ear  canal normal.  Nose: Mucosal edema present. Right sinus exhibits no maxillary sinus tenderness and no frontal sinus tenderness. Left sinus exhibits no maxillary sinus tenderness and no frontal sinus tenderness.  Mouth/Throat: Oropharynx is clear and moist.  Bilateral nares erythematous.   Eyes: Conjunctivae and EOM are normal. Pupils are equal, round, and reactive to light. Right conjunctiva is not injected. Left conjunctiva is not injected.  Neck: Normal range of motion.  Cardiovascular: Normal rate, regular rhythm and normal heart sounds.   Pulmonary/Chest: Effort normal and breath sounds normal. No stridor. No respiratory distress. She has no wheezes. She has no rales.  Abdominal: Soft. She exhibits no distension.  Musculoskeletal: Normal range of motion.  Neurological: She is alert and oriented to person, place, and time. She has normal strength.  Strength 5/5 in all extremities  Skin: Skin is warm and dry. She is not diaphoretic. No erythema.  Psychiatric: She has a normal mood and affect. Her behavior is normal.    ED Course  Procedures (including critical care time)  DIAGNOSTIC STUDIES: Oxygen Saturation is 96% on RA, normal by my interpretation.    COORDINATION OF CARE: 10:14 AM-Discussed treatment plan which includes Sudafed, Afrin and continuing tylenol with pt at bedside and pt agreed to plan.   Labs Review Labs Reviewed - No data to display Imaging Review No results found.  EKG Interpretation   None       MDM   1. Sinusitis   2. Nosebleed    Patient complaining of symptoms of sinusitis.  Mild to moderate symptoms of clear/yellow nasal discharge/congestion and scratchy throat with cough for less than 10 days.  Patient is afebrile.  No concern for acute bacterial rhinosinusitis; likely viral in nature.  Patient discharged with symptomatic treatment.  Patient instructions given for warm saline nasal washes.  Recommendations for follow-up with primary care physician.      I personally performed the services described in this documentation, which was scribed in my presence. The recorded information has been reviewed and is accurate.    Elwyn Lade, PA-C 06/15/13 1038

## 2013-06-15 NOTE — ED Notes (Signed)
Pt c/o nasal congestion and some facial pain x 3 days with sinus pressure; pt denies fever or cough

## 2013-06-15 NOTE — Discharge Instructions (Signed)
Sinus Headache A sinus headache happens when your sinuses become clogged or puffy (swollen). Sinus headaches can be mild or severe. HOME CARE  Take your medicines (antibiotics) as told. Finish them even if you start to feel better.  Only take medicine as told by your doctor.  Use a nose spray if you feel stuffed up (congested). GET HELP RIGHT AWAY IF:  You have a fever.  You have trouble seeing.  You suddenly have pain in your face or head.  You start to twitch or shake (seizure).  You are confused.  You get headaches more than once a week.  Light or sound bothers you.  You feel sick to your stomach (nauseous) or throw up (vomit).  Your headaches do not get better with treatment. MAKE SURE YOU:  Understand these instructions.  Will watch your condition.  Will get help right away if you are not doing well or get worse. Document Released: 09/25/2010 Document Revised: 08/18/2011 Document Reviewed: 09/25/2010 [facility] Patient Information 2014 [place], [place].  Sinusitis Sinusitis is redness, soreness, and puffiness (inflammation) of the air pockets in the bones of your face (sinuses). The redness, soreness, and puffiness can cause air and mucus to get trapped in your sinuses. This can allow germs to grow and cause an infection.  HOME CARE   Drink enough fluids to keep your pee (urine) clear or pale yellow.  Use a humidifier in your home.  Run a hot shower to create steam in the bathroom. Sit in the bathroom with the door closed. Breathe in the steam 3 4 times a day.  Put a warm, moist washcloth on your face 3 4 times a day, or as told by your doctor.  Use salt water sprays (saline sprays) to wet the thick fluid in your nose. This can help the sinuses drain.  Only take medicine as told by your doctor. GET HELP RIGHT AWAY IF:   Your pain gets worse.  You have very bad headaches.  You are sick to your stomach (nauseous).  You throw up (vomit).  You are very  sleepy (drowsy) all the time.  Your face is puffy (swollen).  Your vision changes.  You have a stiff neck.  You have trouble breathing. MAKE SURE YOU:   Understand these instructions.  Will watch your condition.  Will get help right away if you are not doing well or get worse. Document Released: 11/12/2007 Document Revised: 02/18/2012 Document Reviewed: 12/30/2011 [facility] Patient Information 2014 [place].

## 2013-06-17 NOTE — ED Provider Notes (Signed)
Medical screening examination/treatment/procedure(s) were performed by non-physician practitioner and as supervising physician I was immediately available for consultation/collaboration.  EKG Interpretation   None         Houston Siren, MD 06/17/13 [phone]

## 2013-08-25 ENCOUNTER — Ambulatory Visit: Payer: Self-pay | Admitting: Advanced Practice Midwife

## 2013-08-25 IMAGING — CR DG ABDOMEN ACUTE W/ 1V CHEST
4 series · 4 of 4 positions shown · non-contrast
Comparison: Chest radiograph [DATE]

CLINICAL DATA: Abdominal pain and nausea

ACUTE ABDOMEN SERIES (ABDOMEN 2 VIEW & CHEST 1 VIEW)

[w chest pa *]
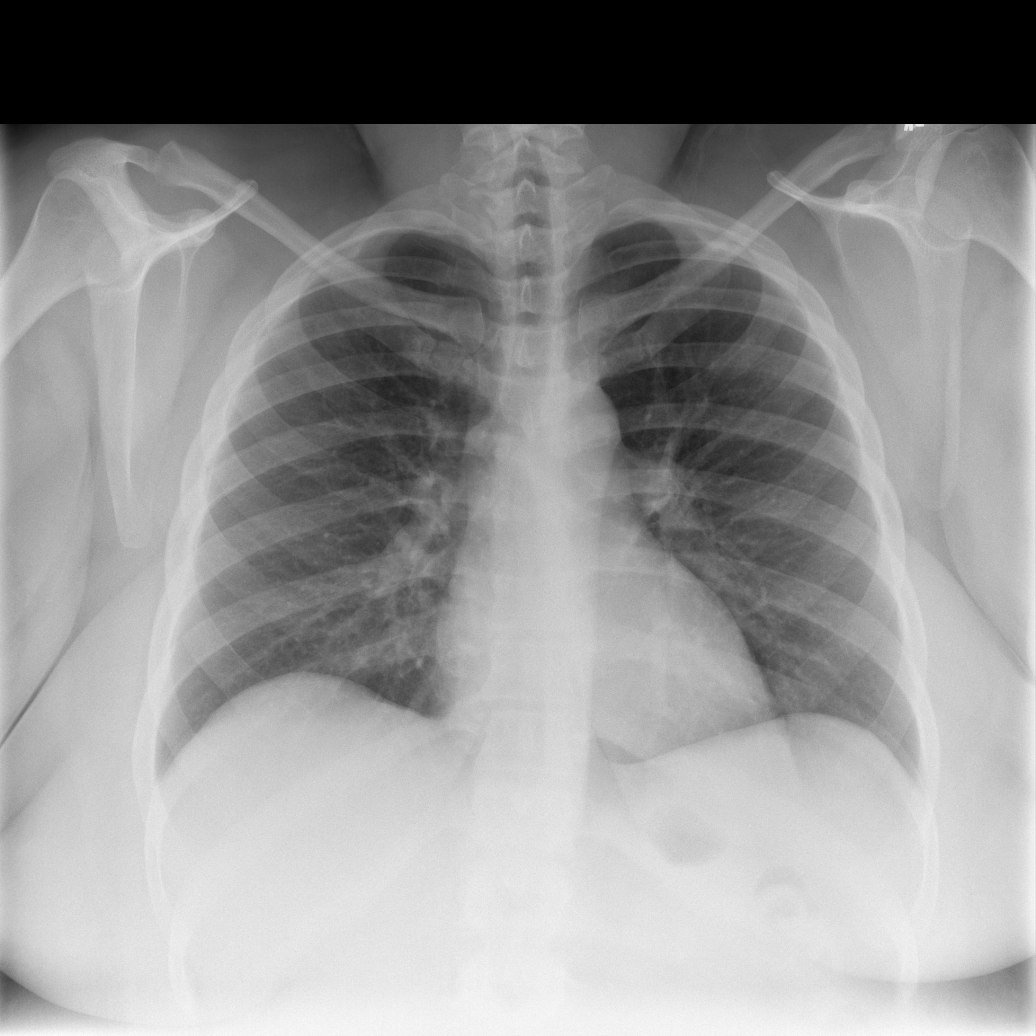

[w abdomen upright *]
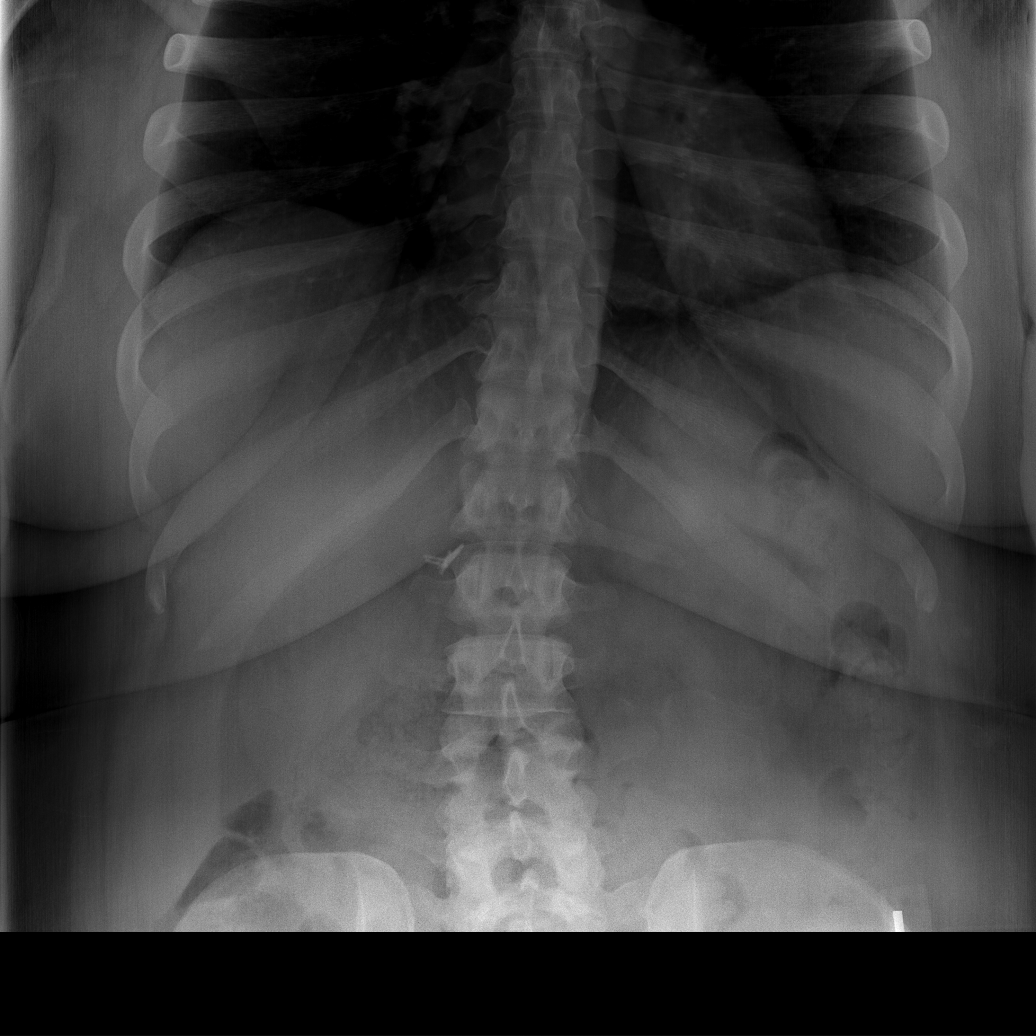

[t abdomen supine * (1 of 2)]
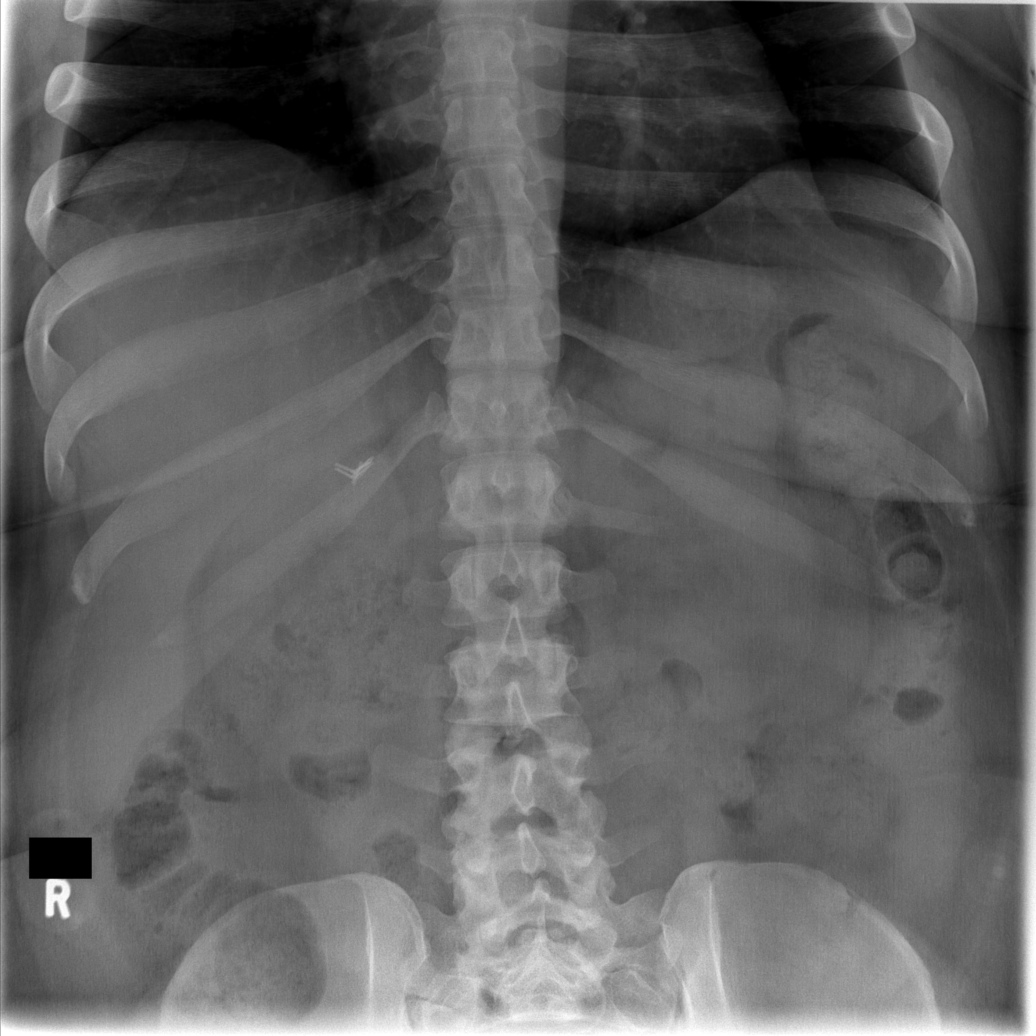

[t abdomen supine * (2 of 2)]
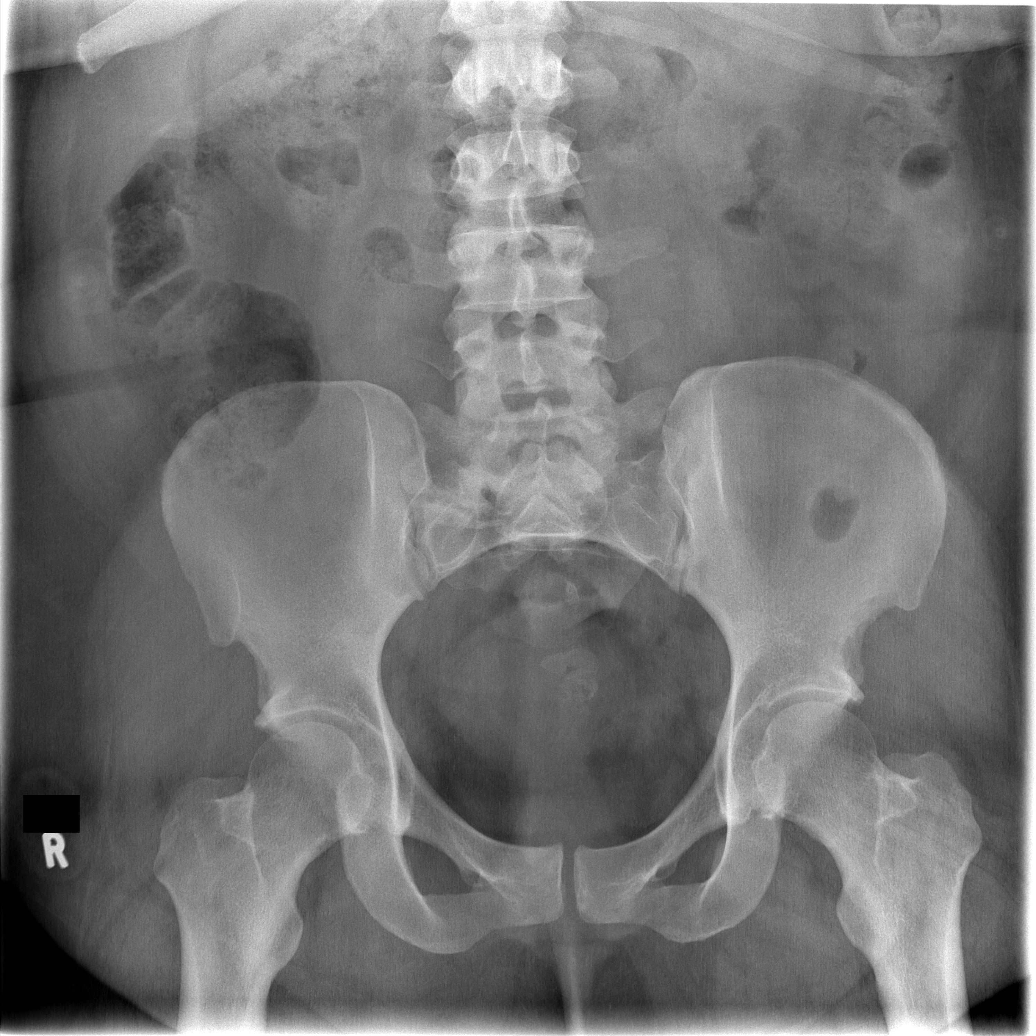

[4 of 4 positions shown; findings below may reference images not displayed]

FINDINGS: There is no evidence of dilated bowel loops or free
intraperitoneal air.  No radiopaque calculi or other significant
radiographic abnormality is seen. Heart size and mediastinal
contours are within normal limits.  Both lungs are clear.
IMPRESSION: Negative abdominal radiographs.  No acute cardiopulmonary disease.

## 2013-09-05 ENCOUNTER — Ambulatory Visit: Payer: Self-pay | Admitting: Obstetrics & Gynecology

## 2013-09-23 ENCOUNTER — Ambulatory Visit: Payer: Self-pay | Admitting: Advanced Practice Midwife

## 2013-11-07 ENCOUNTER — Emergency Department (HOSPITAL_COMMUNITY): Payer: Medicaid Other

## 2013-11-07 ENCOUNTER — Encounter (HOSPITAL_COMMUNITY): Payer: Self-pay | Admitting: Emergency Medicine

## 2013-11-07 ENCOUNTER — Emergency Department (HOSPITAL_COMMUNITY)
Admission: EM | Admit: 2013-11-07 | Discharge: 2013-11-07 | Disposition: A | Discharge status: Home / Self Care | Payer: Medicaid Other | Attending: Emergency Medicine | Admitting: Emergency Medicine

## 2013-11-07 DIAGNOSIS — Z87440 Personal history of urinary (tract) infections: Secondary | ICD-10-CM | POA: Insufficient documentation

## 2013-11-07 DIAGNOSIS — I10 Essential (primary) hypertension: Secondary | ICD-10-CM | POA: Insufficient documentation

## 2013-11-07 DIAGNOSIS — Z9851 Tubal ligation status: Secondary | ICD-10-CM | POA: Insufficient documentation

## 2013-11-07 DIAGNOSIS — F329 Major depressive disorder, single episode, unspecified: Secondary | ICD-10-CM | POA: Insufficient documentation

## 2013-11-07 DIAGNOSIS — Z8742 Personal history of other diseases of the female genital tract: Secondary | ICD-10-CM | POA: Insufficient documentation

## 2013-11-07 DIAGNOSIS — R079 Chest pain, unspecified: Secondary | ICD-10-CM

## 2013-11-07 DIAGNOSIS — Z8709 Personal history of other diseases of the respiratory system: Secondary | ICD-10-CM | POA: Insufficient documentation

## 2013-11-07 DIAGNOSIS — Z87891 Personal history of nicotine dependence: Secondary | ICD-10-CM | POA: Insufficient documentation

## 2013-11-07 DIAGNOSIS — F3289 Other specified depressive episodes: Secondary | ICD-10-CM | POA: Insufficient documentation

## 2013-11-07 DIAGNOSIS — Z8619 Personal history of other infectious and parasitic diseases: Secondary | ICD-10-CM | POA: Insufficient documentation

## 2013-11-07 DIAGNOSIS — R51 Headache: Secondary | ICD-10-CM | POA: Insufficient documentation

## 2013-11-07 DIAGNOSIS — Z9089 Acquired absence of other organs: Secondary | ICD-10-CM | POA: Insufficient documentation

## 2013-11-07 DIAGNOSIS — K219 Gastro-esophageal reflux disease without esophagitis: Secondary | ICD-10-CM

## 2013-11-07 DIAGNOSIS — F411 Generalized anxiety disorder: Secondary | ICD-10-CM | POA: Insufficient documentation

## 2013-11-07 LAB — BASIC METABOLIC PANEL
BUN: 5 mg/dL — ABNORMAL LOW (ref 6–23)
CO2: 25 mEq/L (ref 19–32)
Calcium: 8.9 mg/dL (ref 8.4–10.5)
Chloride: 103 mEq/L (ref 96–112)
Creatinine, Ser: 0.63 mg/dL (ref 0.50–1.10)
GFR calc Af Amer: >90 mL/min (ref >90)
GFR calc non Af Amer: >90 mL/min (ref >90)
Glucose, Bld: 101 mg/dL — ABNORMAL HIGH (ref 70–99)
Potassium: 3.6 mEq/L — ABNORMAL LOW (ref 3.7–5.3)
Sodium: 139 mEq/L (ref 137–147)

## 2013-11-07 LAB — CBC
HCT: 33.6 % — ABNORMAL LOW (ref 36.0–46.0)
Hemoglobin: 11.1 g/dL — ABNORMAL LOW (ref 12.0–15.0)
MCH: 27.8 pg (ref 26.0–34.0)
MCHC: 33 g/dL (ref 30.0–36.0)
MCV: 84 fL (ref 78.0–100.0)
Platelets: 285 10*3/uL (ref 150–400)
RBC: 4 MIL/uL (ref 3.87–5.11)
RDW: 14.5 % (ref 11.5–15.5)
WBC: 8.7 10*3/uL (ref 4.0–10.5)

## 2013-11-07 LAB — I-STAT TROPONIN, ED: Troponin i, poc: 0 ng/mL (ref 0.00–0.08)

## 2013-11-07 MED ORDER — RANITIDINE HCL 150 MG PO CAPS: 150.0000 mg | ORAL_CAPSULE | Freq: Every day | ORAL | Status: DC

## 2013-11-07 MED ORDER — HYDROCHLOROTHIAZIDE 12.5 MG PO TABS: 12.5000 mg | ORAL_TABLET | Freq: Every day | ORAL | Status: DC

## 2013-11-07 MED ORDER — GI COCKTAIL ~~LOC~~
30.0000 mL | Freq: Once | ORAL | Status: AC
  Administered 2013-11-07: 30 mL via ORAL
  Filled 2013-11-07: qty 30

## 2013-11-07 MED ORDER — PANTOPRAZOLE SODIUM 40 MG IV SOLR
40.0000 mg | Freq: Once | INTRAVENOUS | Status: AC
  Administered 2013-11-07: 40 mg via INTRAVENOUS
  Filled 2013-11-07: qty 40

## 2013-11-07 MED ORDER — OMEPRAZOLE 20 MG PO CPDR: 20.0000 mg | DELAYED_RELEASE_CAPSULE | Freq: Every day | ORAL | Status: DC

## 2013-11-07 NOTE — Discharge Instructions (Signed)
Chest Pain (Nonspecific) °It is often hard to give a specific diagnosis for the cause of chest pain. There is always a chance that your pain could be related to something serious, such as a heart attack or a blood clot in the lungs. You need to follow up with your caregiver for further evaluation. °CAUSES  °· Heartburn. °· Pneumonia or bronchitis. °· Anxiety or stress. °· Inflammation around your heart (pericarditis) or lung (pleuritis or pleurisy). °· A blood clot in the lung. °· A collapsed lung (pneumothorax). It can develop suddenly on its own (spontaneous pneumothorax) or from injury (trauma) to the chest. °· Shingles infection (herpes zoster virus). °The chest wall is composed of bones, muscles, and cartilage. Any of these can be the source of the pain. °· The bones can be bruised by injury. °· The muscles or cartilage can be strained by coughing or overwork. °· The cartilage can be affected by inflammation and become sore (costochondritis). °DIAGNOSIS  °Lab tests or other studies, such as X-rays, electrocardiography, stress testing, or cardiac imaging, may be needed to find the cause of your pain.  °TREATMENT  °· Treatment depends on what may be causing your chest pain. Treatment may include: °· Acid blockers for heartburn. °· Anti-inflammatory medicine. °· Pain medicine for inflammatory conditions. °· Antibiotics if an infection is present. °· You may be advised to change lifestyle habits. This includes stopping smoking and avoiding alcohol, caffeine, and chocolate. °· You may be advised to keep your head raised (elevated) when sleeping. This reduces the chance of acid going backward from your stomach into your esophagus. °· Most of the time, nonspecific chest pain will improve within 2 to 3 days with rest and mild pain medicine. °HOME CARE INSTRUCTIONS  °· If antibiotics were prescribed, take your antibiotics as directed. Finish them even if you start to feel better. °· For the next few days, avoid physical  activities that bring on chest pain. Continue physical activities as directed. °· Do not smoke. °· Avoid drinking alcohol. °· Only take over-the-counter or prescription medicine for pain, discomfort, or fever as directed by your caregiver. °· Follow your caregiver's suggestions for further testing if your chest pain does not go away. °· Keep any follow-up appointments you made. If you do not go to an appointment, you could develop lasting (chronic) problems with pain. If there is any problem keeping an appointment, you must call to reschedule. °SEEK MEDICAL CARE IF:  °· You think you are having problems from the medicine you are taking. Read your medicine instructions carefully. °· Your chest pain does not go away, even after treatment. °· You develop a rash with blisters on your chest. °SEEK IMMEDIATE MEDICAL CARE IF:  °· You have increased chest pain or pain that spreads to your arm, neck, jaw, back, or abdomen. °· You develop shortness of breath, an increasing cough, or you are coughing up blood. °· You have severe back or abdominal pain, feel nauseous, or vomit. °· You develop severe weakness, fainting, or chills. °· You have a fever. °THIS IS AN EMERGENCY. Do not wait to see if the pain will go away. Get medical help at once. Call your local emergency services (911 in [place] Do not drive yourself to the hospital. °MAKE SURE YOU:  °· Understand these instructions. °· Will watch your condition. °· Will get help right away if you are not doing well or get worse. °Document Released: 03/05/2005 Document Revised: 08/18/2011 Document Reviewed: 12/30/2007 °[employer]® Patient Information ©2014 [employer]   [employer].  Diet for Gastroesophageal Reflux Disease, Adult Reflux (acid reflux) is when acid from your stomach flows up into the esophagus. When acid comes in contact with the esophagus, the acid causes irritation and soreness (inflammation) in the esophagus. When reflux happens often or so severely that it causes damage to  the esophagus, it is called gastroesophageal reflux disease (GERD). Nutrition therapy can help ease the discomfort of GERD. FOODS OR DRINKS TO AVOID OR LIMIT  Smoking or chewing tobacco. Nicotine is one of the most potent stimulants to acid production in the gastrointestinal tract.  Caffeinated and decaffeinated coffee and black tea.  Regular or low-calorie carbonated beverages or energy drinks (caffeine-free carbonated beverages are allowed).   Strong spices, such as black pepper, white pepper, red pepper, cayenne, curry powder, and chili powder.  Peppermint or spearmint.  Chocolate.  High-fat foods, including meats and fried foods. Extra added fats including oils, butter, salad dressings, and nuts. Limit these to less than 8 tsp per day.  Fruits and vegetables if they are not tolerated, such as citrus fruits or tomatoes.  Alcohol.  Any food that seems to aggravate your condition. If you have questions regarding your diet, call your caregiver or a registered dietitian. OTHER THINGS THAT MAY HELP GERD INCLUDE:   Eating your meals slowly, in a relaxed setting.  Eating 5 to 6 small meals per day instead of 3 large meals.  Eliminating food for a period of time if it causes distress.  Not lying down until 3 hours after eating a meal.  Keeping the head of your bed raised 6 to 9 inches (15 to 23 cm) by using a foam wedge or blocks under the legs of the bed. Lying flat may make symptoms worse.  Being physically active. Weight loss may be helpful in reducing reflux in overweight or obese adults.  Wear loose fitting clothing EXAMPLE MEAL PLAN This meal plan is approximately 2,000 calories based on [employer] meal planning guidelines. Breakfast   cup cooked oatmeal.  1 cup strawberries.  1 cup low-fat milk.  1 oz almonds. Snack  1 cup cucumber slices.  6 oz yogurt (made from low-fat or fat-free milk). Lunch  2 slice whole-wheat bread.  2 oz sliced  [place].  2 tsp mayonnaise.  1 cup blueberries.  1 cup snap peas. Snack  6 whole-wheat crackers.  1 oz string cheese. Dinner   cup brown rice.  1 cup mixed veggies.  1 tsp olive oil.  3 oz grilled fish. Document Released: 05/26/2005 Document Revised: 08/18/2011 Document Reviewed: 04/11/2011 [facility] Patient Information 2014 [place], [place].

## 2013-11-07 NOTE — ED Provider Notes (Signed)
CSN: [id number]     Arrival date & time 11/07/13  0906 History   First MD Initiated Contact with Patient 11/07/13 [phone]     Chief Complaint  Patient presents with  . Headache  . Chest Pain     (Consider location/radiation/quality/duration/timing/severity/associated sxs/prior Treatment) HPI  28 year old obese female with history of bronchitis, hypertension, PID, fibroid, recurrent urinary tract infection brought in by EMS, for evaluation of chest pain.  Patient report last night she went to sleep with headache, and central chest discomfort she described as a burning sensation. She woke up take some ibuprofen which has helped with the headache however her chest discomfort has been persistent. She reports lightheadedness and dizziness but no shortness of breath or exertional chest pain. No diaphoresis. Report tingling sensation to both arms.  She forces herself to vomited once and noticed vomitus with a mix of food and small amounts of blood. She continues to complain of burning sensation to her chest. Her chest discomfort has been persistent since midnight.The symptoms are similar to her usual gastric reflux however it has never lasted this long. EMS gave her ASA and SL nitro with minimal improvement of sxs. She admits to taking ibuprofen every other day for headache. She denies fever, chills, abd pain, back pain, black tarry stool.  No other complaint.  Has quit smoking a year ago.  No significant family hx of premature cardiac disease.  No hx of diabetes.    Past Medical History  Diagnosis Date  . Bronchitis   . Hypertension   . Acid reflux   . Pregnancy induced hypertension   . Urinary tract infection   . History of PID   . Abnormal Pap smear     colposcopy, mild dysplasia, HPV  . Gonorrhea   . Chlamydia   . Trichomonas   . Depression     on meds for bipolar  . Anxiety     on meds- stable  . Obesity   . Sleep apnea     hasnt uses CPAP x 4 years  . Fibroid    Past Surgical History   Procedure Laterality Date  . Wisdom tooth extraction    . Foot surgery    . Tubal ligation    . Cesarean section  05/02/03,05/04/08,10/23/10    x3  . Cholecystectomy N/A 10/07/2012    Procedure: LAPAROSCOPIC CHOLECYSTECTOMY WITH INTRAOPERATIVE CHOLANGIOGRAM;  Surgeon: Imogene Burn. Georgette Dover, MD;  Location: [facility];  Service: General;  Laterality: N/A;  . Umbilical hernia repair N/A 12/02/2012    Procedure: LAPAROSCOPIC UMBILICAL HERNIA;  Surgeon: Adin Hector, MD;  Location: [facility];  Service: General;  Laterality: N/A;  . Insertion of mesh N/A 12/02/2012    Procedure: INSERTION OF MESH;  Surgeon: Adin Hector, MD;  Location: [facility];  Service: General;  Laterality: N/A;   Family History  Problem Relation Age of Onset  . Other Neg Hx   . Hypertension Mother   . Heart disease Father   . Stroke Father    History  Substance Use Topics  . Smoking status: Former Smoker -- 0.25 packs/day for 1 years    Types: Cigarettes  . Smokeless tobacco: Never Used  . Alcohol Use: Yes     Comment: socially   OB History   Grav Para Term Preterm Abortions TAB SAB Ect Mult Living   3 3 3       3      Review of Systems  Cardiovascular: Positive for chest pain.  Gastrointestinal:  Negative for abdominal pain.  All other systems reviewed and are negative.     Allergies  Nsaids and Tylenol  Home Medications   Prior to Admission medications   Medication Sig Start Date End Date Taking? Authorizing Provider  acetaminophen (TYLENOL) 500 MG tablet Take 1,000 mg by mouth every 6 (six) hours as needed for mild pain.    Historical Provider, MD  lurasidone (LATUDA) 40 MG TABS tablet Take 40 mg by mouth at bedtime.    Historical Provider, MD  methocarbamol (ROBAXIN-750) 750 MG tablet Take 1 tablet (750 mg total) by mouth every 6 (six) hours as needed (For spasms). 02/22/13   Adin Hector, MD  oxymetazoline (AFRIN NASAL SPRAY) 0.05 % nasal spray Place 1 spray into both nostrils 2 (two) times daily. For 3  days 06/15/13   Elwyn Lade, PA-C  pseudoephedrine (SUDAFED) 60 MG tablet Take 1 tablet (60 mg total) by mouth every 6 (six) hours as needed for congestion. 06/15/13   Elwyn Lade, PA-C  sertraline (ZOLOFT) 50 MG tablet Take 50 mg by mouth daily.    Historical Provider, MD  sodium chloride (OCEAN) 0.65 % SOLN nasal spray Place 1 spray into both nostrils as needed for congestion. 06/15/13   Elwyn Lade, PA-C   Ht 5\' 5"  (1.651 m)  Wt 297 lb (134.718 kg)  BMI 49.42 kg/m2  SpO2 99%  LMP 11/07/2013 Physical Exam  Nursing note and vitals reviewed. Constitutional: She is oriented to person, place, and time. She appears well-developed and well-nourished. No distress.  Moderately obese African American female appears to be in no acute distress  HENT:  Head: Atraumatic.  Mouth/Throat: Oropharynx is clear and moist.  Eyes: Conjunctivae are normal.  Neck: Neck supple. No JVD present. No tracheal deviation present.  Cardiovascular: Normal rate and regular rhythm.   Pulmonary/Chest: Effort normal and breath sounds normal. She has no wheezes. She has no rales. She exhibits no tenderness.  Abdominal: Soft. There is no tenderness.  Musculoskeletal: She exhibits no edema.  Neurological: She is alert and oriented to person, place, and time.  Skin: No rash noted.  Psychiatric: She has a normal mood and affect.    ED Course  Procedures (including critical care time)  9:44 AM Pt here with burning chest discomfort suggestive of GERD/PUD.  Pt is PERC negative.  TIMI 0.  GI cocktail, PPI given.  Work up initiated.    10:58 AM For workup is essentially unremarkable. Pain resolved after receiving GI cocktail and PPI. I have low suspicion for ACS. Low suspicion for PE. She does have a primary care Dr. which I encouraged patient to follow. Plan to discharge with PPI and H2-blocker. Return precautions discussed. Resource provided.  11:17 AM Pt has hx of HTN, currently not taking any meds for that.   She has been hypertensive in ER.  Doubt hypertensive emergency causing end organ damage.  Will prescribe HCTZ and have pt f/u with PCP for further care.   Labs Review Labs Reviewed  CBC - Abnormal; Notable for the following:    Hemoglobin 11.1 (*)    HCT 33.6 (*)    All other components within normal limits  BASIC METABOLIC PANEL - Abnormal; Notable for the following:    Potassium 3.6 (*)    Glucose, Bld 101 (*)    BUN 5 (*)    All other components within normal limits  Randolm Idol, ED    Imaging Review Dg Chest 2 View  11/07/2013  CLINICAL DATA:  Chest pain.  EXAM: CHEST  2 VIEW  COMPARISON:  12/05/2012.  FINDINGS: Trachea is midline. Heart size normal. Lungs are clear. No pleural fluid.  IMPRESSION: No acute findings.   Electronically Signed   By: Lorin Picket M.D.   On: 11/07/2013 09:59     EKG Interpretation   Date/Time:  Monday November 07 2013 09:15:27 EDT Ventricular Rate:  70 PR Interval:  146 QRS Duration: 95 QT Interval:  425 QTC Calculation: 459 R Axis:   46 Text Interpretation:  Sinus rhythm Confirmed by HORTON  MD, COURTNEY  ([id number]) on 11/07/2013 9:24:09 AM      MDM   Final diagnoses:  Chest pain with low risk for cardiac etiology  Acid reflux    BP 172/112  Pulse 73  Temp(Src) 98.1 F (36.7 C)  Resp 15  Ht 5\' 5"  (1.651 m)  Wt 297 lb (134.718 kg)  BMI 49.42 kg/m2  SpO2 99%  LMP 11/07/2013  I have reviewed nursing notes and vital signs. I personally reviewed the imaging tests through PACS system  I reviewed available ER/hospitalization records thought the EMR     Domenic Moras, [place] 11/07/13 1118

## 2013-11-07 NOTE — ED Notes (Signed)
Patient transported to X-ray 

## 2013-11-07 NOTE — ED Notes (Addendum)
Pt c/o headache at [facility] chest pain. Pt took ibuprofen which relieved her headache. Pt describes chest pain as burning. Pt forced herself to vomit, reports there was a little bit of blood mixed with the food she ate last night. Pt was given 324 mg of ASA and 1 Nitro by EMS

## 2013-11-07 NOTE — ED Provider Notes (Signed)
Medical screening examination/treatment/procedure(s) were performed by non-physician practitioner and as supervising physician I was immediately available for consultation/collaboration.   EKG Interpretation   Date/Time:  Monday November 07 2013 09:15:27 EDT Ventricular Rate:  70 PR Interval:  146 QRS Duration: 95 QT Interval:  425 QTC Calculation: 459 R Axis:   46 Text Interpretation:  Sinus rhythm Confirmed by Dina Rich  MD, Naheem Mosco  ([id number]) on 11/07/2013 9:24:09 AM        Merryl Hacker, MD 11/07/13 1248

## 2013-12-04 ENCOUNTER — Emergency Department (HOSPITAL_COMMUNITY)
Admission: EM | Admit: 2013-12-04 | Discharge: 2013-12-04 | Disposition: A | Discharge status: Home / Self Care | Payer: Medicaid Other | Attending: Emergency Medicine | Admitting: Emergency Medicine

## 2013-12-04 ENCOUNTER — Encounter (HOSPITAL_COMMUNITY): Payer: Self-pay | Admitting: Emergency Medicine

## 2013-12-04 DIAGNOSIS — Z87891 Personal history of nicotine dependence: Secondary | ICD-10-CM | POA: Insufficient documentation

## 2013-12-04 DIAGNOSIS — Z87440 Personal history of urinary (tract) infections: Secondary | ICD-10-CM | POA: Insufficient documentation

## 2013-12-04 DIAGNOSIS — K219 Gastro-esophageal reflux disease without esophagitis: Secondary | ICD-10-CM | POA: Insufficient documentation

## 2013-12-04 DIAGNOSIS — Z8742 Personal history of other diseases of the female genital tract: Secondary | ICD-10-CM | POA: Insufficient documentation

## 2013-12-04 DIAGNOSIS — Z79899 Other long term (current) drug therapy: Secondary | ICD-10-CM | POA: Insufficient documentation

## 2013-12-04 DIAGNOSIS — Z8659 Personal history of other mental and behavioral disorders: Secondary | ICD-10-CM | POA: Insufficient documentation

## 2013-12-04 DIAGNOSIS — G44209 Tension-type headache, unspecified, not intractable: Secondary | ICD-10-CM

## 2013-12-04 DIAGNOSIS — I10 Essential (primary) hypertension: Secondary | ICD-10-CM | POA: Insufficient documentation

## 2013-12-04 DIAGNOSIS — Z8709 Personal history of other diseases of the respiratory system: Secondary | ICD-10-CM | POA: Insufficient documentation

## 2013-12-04 DIAGNOSIS — R51 Headache: Secondary | ICD-10-CM | POA: Insufficient documentation

## 2013-12-04 DIAGNOSIS — Z8619 Personal history of other infectious and parasitic diseases: Secondary | ICD-10-CM | POA: Insufficient documentation

## 2013-12-04 MED ORDER — SODIUM CHLORIDE 0.9 % IV SOLN
1000.0000 mL | Freq: Once | INTRAVENOUS | Status: AC
  Administered 2013-12-04: 1000 mL via INTRAVENOUS

## 2013-12-04 MED ORDER — METOCLOPRAMIDE HCL 5 MG/ML IJ SOLN
10.0000 mg | Freq: Once | INTRAMUSCULAR | Status: AC
  Administered 2013-12-04: 10 mg via INTRAVENOUS
  Filled 2013-12-04: qty 2

## 2013-12-04 MED ORDER — DIPHENHYDRAMINE HCL 50 MG/ML IJ SOLN
25.0000 mg | Freq: Once | INTRAMUSCULAR | Status: AC
Start: 2013-12-04 — End: 2013-12-04
  Administered 2013-12-04: 25 mg via INTRAVENOUS
  Filled 2013-12-04: qty 1

## 2013-12-04 MED ORDER — METOCLOPRAMIDE HCL 10 MG PO TABS: 10.0000 mg | ORAL_TABLET | Freq: Four times a day (QID) | ORAL | Status: DC | PRN

## 2013-12-04 MED ORDER — SODIUM CHLORIDE 0.9 % IV SOLN
1000.0000 mL | INTRAVENOUS | Status: DC
  Administered 2013-12-04: 1000 mL via INTRAVENOUS

## 2013-12-04 NOTE — ED Provider Notes (Signed)
CSN: [id number]     Arrival date & time 12/04/13  0326 History   First MD Initiated Contact with Patient 12/04/13 [phone]     Chief Complaint  Patient presents with  . Headache     (Consider location/radiation/quality/duration/timing/severity/associated sxs/prior Treatment) Patient is a 28 y.o. female presenting with headaches. The history is provided by the patient.  Headache She had onset history morning of a throbbing headache which was at her vertex but radiated toward her neck. She rated the pain at an 8/10. She denies any vision change. There is associated nausea without vomiting. She also has felt off balance. She relates that this headache is similar to previous headaches she has had. She has not taken any medication because she doesn't like taking any medication. She denies fever chills or sweats. There has been no focal weakness or numbness or tingling. He there is moderate photophobia. Nothing makes her headache better.  Past Medical History  Diagnosis Date  . Bronchitis   . Hypertension   . Acid reflux   . Pregnancy induced hypertension   . Urinary tract infection   . History of PID   . Abnormal Pap smear     colposcopy, mild dysplasia, HPV  . Gonorrhea   . Chlamydia   . Trichomonas   . Depression     on meds for bipolar  . Anxiety     on meds- stable  . Obesity   . Sleep apnea     hasnt uses CPAP x 4 years  . Fibroid    Past Surgical History  Procedure Laterality Date  . Wisdom tooth extraction    . Foot surgery    . Tubal ligation    . Cesarean section  05/02/03,05/04/08,10/23/10    x3  . Cholecystectomy N/A 10/07/2012    Procedure: LAPAROSCOPIC CHOLECYSTECTOMY WITH INTRAOPERATIVE CHOLANGIOGRAM;  Surgeon: Imogene Burn. Georgette Dover, MD;  Location: [facility];  Service: General;  Laterality: N/A;  . Umbilical hernia repair N/A 12/02/2012    Procedure: LAPAROSCOPIC UMBILICAL HERNIA;  Surgeon: Adin Hector, MD;  Location: [facility];  Service: General;  Laterality: N/A;  .  Insertion of mesh N/A 12/02/2012    Procedure: INSERTION OF MESH;  Surgeon: Adin Hector, MD;  Location: [facility];  Service: General;  Laterality: N/A;   Family History  Problem Relation Age of Onset  . Other Neg Hx   . Hypertension Mother   . Heart disease Father   . Stroke Father    History  Substance Use Topics  . Smoking status: Former Smoker -- 0.25 packs/day for 1 years    Types: Cigarettes  . Smokeless tobacco: Never Used  . Alcohol Use: Yes     Comment: socially   OB History   Grav Para Term Preterm Abortions TAB SAB Ect Mult Living   3 3 3       3      Review of Systems  Neurological: Positive for headaches.  All other systems reviewed and are negative.     Allergies  Nsaids and Tylenol  Home Medications   Prior to Admission medications   Medication Sig Start Date End Date Taking? Authorizing Provider  hydrochlorothiazide (HYDRODIURIL) 12.5 MG tablet Take 1 tablet (12.5 mg total) by mouth daily. 11/07/13  Yes Domenic Moras, PA-C  omeprazole (PRILOSEC) 20 MG capsule Take 1 capsule (20 mg total) by mouth daily. 11/07/13  Yes Domenic Moras, PA-C  ranitidine (ZANTAC) 150 MG capsule Take 1 capsule (150 mg total) by mouth daily.  11/07/13  Yes Domenic Moras, PA-C   BP 158/113  Pulse 78  Temp(Src) 98.4 F (36.9 C) (Oral)  Resp 16  Ht 5\' 5"  (1.651 m)  Wt 304 lb 3 oz (137.979 kg)  BMI 50.62 kg/m2  SpO2 98%  LMP 11/07/2013 Physical Exam  Nursing note and vitals reviewed.  28 year old female, resting comfortably and in no acute distress. Vital signs are significant for hypertension with blood pressure 150/113. Oxygen saturation is 98%, which is normal. Head is normocephalic and atraumatic. PERRLA, EOMI. Oropharynx is clear. Fundi show no hemorrhage, exudate, or papilledema. Neck is nontender and supple without adenopathy or JVD. There is tenderness at the insertion of paracervical muscles. Back is nontender and there is no CVA tenderness. Lungs are clear without rales, wheezes,  or rhonchi. Chest is nontender. Heart has regular rate and rhythm without murmur. Abdomen is soft, flat, nontender without masses or hepatosplenomegaly and peristalsis is normoactive. Extremities have no cyanosis or edema, full range of motion is present. Skin is warm and dry without rash. Neurologic: Mental status is normal, cranial nerves are intact, there are no motor or sensory deficits. There is no pronator drift. There is no extinction on double simultaneous stimulation.  ED Course  Procedures (including critical care time)  MDM   Final diagnoses:  Muscle contraction headache    Muscle contraction headache. She will be treated with a headache cocktail of normal saline bolus, IV metoclopramide, and IV diphenhydramine. Of note, patient states that she has not taken her blood pressure medication because of concerns of medication side effects. Old records are reviewed and she had has prior ED visits for headaches.  She got good relief of headache with above noted treatment. She is discharged with prescription for metoclopramide. Followup blood pressure was normal. She is advised to monitor her blood pressure at home so that her PCP can make a decision about need for treating her blood pressure.    Delora Fuel, MD 59/16/38 [id number]

## 2013-12-04 NOTE — ED Notes (Signed)
Pt reports headache since 1130 yesterday and reports right arm feeling weak since 1200 yesterday--no drift or weak grip.  Pt reports bumping into things.  Headache to top of head and reports pounding.

## 2013-12-04 NOTE — Discharge Instructions (Signed)
Monitor your blood pressure at home.  Tension Headache A tension headache is a feeling of pain, pressure, or aching often felt over the front and sides of the head. The pain can be dull or can feel tight (constricting). It is the most common type of headache. Tension headaches are not normally associated with nausea or vomiting and do not get worse with physical activity. Tension headaches can last 30 minutes to several days.  CAUSES  The exact cause is not known, but it may be caused by chemicals and hormones in the brain that lead to pain. Tension headaches often begin after stress, anxiety, or depression. Other triggers may include:  Alcohol.  Caffeine (too much or withdrawal).  Respiratory infections (colds, flu, sinus infections).  Dental problems or teeth clenching.  Fatigue.  Holding your head and neck in one position too long while using a computer. SYMPTOMS   Pressure around the head.   Dull, aching head pain.   Pain felt over the front and sides of the head.   Tenderness in the muscles of the head, neck, and shoulders. DIAGNOSIS  A tension headache is often diagnosed based on:   Symptoms.   Physical examination.   A CT scan or MRI of your head. These tests may be ordered if symptoms are severe or unusual. TREATMENT  Medicines may be given to help relieve symptoms.  HOME CARE INSTRUCTIONS   Only take over-the-counter or prescription medicines for pain or discomfort as directed by your caregiver.   Lie down in a dark, quiet room when you have a headache.   Keep a journal to find out what may be triggering your headaches. For example, write down:  What you eat and drink.  How much sleep you get.  Any change to your diet or medicines.  Try massage or other relaxation techniques.   Ice packs or heat applied to the head and neck can be used. Use these 3 to 4 times per day for 15 to 20 minutes each time, or as needed.   Limit stress.   Sit up  straight, and do not tense your muscles.   Quit smoking if you smoke.  Limit alcohol use.  Decrease the amount of caffeine you drink, or stop drinking caffeine.  Eat and exercise regularly.  Get 7 to 9 hours of sleep, or as recommended by your caregiver.  Avoid excessive use of pain medicine as recurrent headaches can occur.  SEEK MEDICAL CARE IF:   You have problems with the medicines you were prescribed.  Your medicines do not work.  You have a change from the usual headache.  You have nausea or vomiting. SEEK IMMEDIATE MEDICAL CARE IF:   Your headache becomes severe.  You have a fever.  You have a stiff neck.  You have loss of vision.  You have muscular weakness or loss of muscle control.  You lose your balance or have trouble walking.  You feel faint or pass out.  You have severe symptoms that are different from your first symptoms. MAKE SURE YOU:   Understand these instructions.  Will watch your condition.  Will get help right away if you are not doing well or get worse. Document Released: 05/26/2005 Document Revised: 08/18/2011 Document Reviewed: 05/16/2011 [facility] Patient Information 2015 [place], [place]. This information is not intended to replace advice given to you by your health care provider. Make sure you discuss any questions you have with your health care provider.  Metoclopramide tablets What is this  medicine? METOCLOPRAMIDE (met oh kloe PRA mide) is used to treat the symptoms of gastroesophageal reflux disease (GERD) like heartburn. It is also used to treat people with slow emptying of the stomach and intestinal tract. This medicine may be used for other purposes; ask your health care provider or pharmacist if you have questions. COMMON BRAND NAME(S): Reglan What should I tell my health care provider before I take this medicine? They need to know if you have any of these conditions: -breast cancer -depression -diabetes -heart  failure -high blood pressure -kidney disease -liver disease -Parkinson's disease or a movement disorder -pheochromocytoma -seizures -stomach obstruction, bleeding, or perforation -an unusual or allergic reaction to metoclopramide, procainamide, sulfites, other medicines, foods, dyes, or preservatives -pregnant or trying to get pregnant -breast-feeding How should I use this medicine? Take this medicine by mouth with a glass of water. Follow the directions on the prescription label. Take this medicine on an empty stomach, about 30 minutes before eating. Take your doses at regular intervals. Do not take your medicine more often than directed. Do not stop taking except on the advice of your doctor or health care professional. A special MedGuide will be given to you by the pharmacist with each prescription and refill. Be sure to read this information carefully each time. Talk to your pediatrician regarding the use of this medicine in children. Special care may be needed. Overdosage: If you think you have taken too much of this medicine contact a poison control center or emergency room at once. NOTE: This medicine is only for you. Do not share this medicine with others. What if I miss a dose? If you miss a dose, take it as soon as you can. If it is almost time for your next dose, take only that dose. Do not take double or extra doses. What may interact with this medicine? -acetaminophen -cyclosporine -digoxin -medicines for blood pressure -medicines for diabetes, including insulin -medicines for hay fever and other allergies -medicines for depression, especially an Monoamine Oxidase Inhibitor (MAOI) -medicines for Parkinson's disease, like levodopa -medicines for sleep or for pain -tetracycline This list may not describe all possible interactions. Give your health care provider a list of all the medicines, herbs, non-prescription drugs, or dietary supplements you use. Also tell them if you  smoke, drink alcohol, or use illegal drugs. Some items may interact with your medicine. What should I watch for while using this medicine? It may take a few weeks for your stomach condition to start to get better. However, do not take this medicine for longer than 12 weeks. The longer you take this medicine, and the more you take it, the greater your chances are of developing serious side effects. If you are an elderly patient, a female patient, or you have diabetes, you may be at an increased risk for side effects from this medicine. Contact your doctor immediately if you start having movements you cannot control such as lip smacking, rapid movements of the tongue, involuntary or uncontrollable movements of the eyes, head, arms and legs, or muscle twitches and spasms. Patients and their families should watch out for worsening depression or thoughts of suicide. Also watch out for any sudden or severe changes in feelings such as feeling anxious, agitated, panicky, irritable, hostile, aggressive, impulsive, severely restless, overly excited and hyperactive, or not being able to sleep. If this happens, especially at the beginning of treatment or after a change in dose, call your doctor. Do not treat yourself for high fever. Ask  your doctor or health care professional for advice. You may get drowsy or dizzy. Do not drive, use machinery, or do anything that needs mental alertness until you know how this drug affects you. Do not stand or sit up quickly, especially if you are an older patient. This reduces the risk of dizzy or fainting spells. Alcohol can make you more drowsy and dizzy. Avoid alcoholic drinks. What side effects may I notice from receiving this medicine? Side effects that you should report to your doctor or health care professional as soon as possible: -allergic reactions like skin rash, itching or hives, swelling of the face, lips, or tongue -abnormal production of milk in females -breast  enlargement in both males and females -change in the way you walk -difficulty moving, speaking or swallowing -drooling, lip smacking, or rapid movements of the tongue -excessive sweating -fever -involuntary or uncontrollable movements of the eyes, head, arms and legs -irregular heartbeat or palpitations -muscle twitches and spasms -unusually weak or tired Side effects that usually do not require medical attention (report to your doctor or health care professional if they continue or are bothersome): -change in sex drive or performance -depressed mood -diarrhea -difficulty sleeping -headache -menstrual changes -restless or nervous This list may not describe all possible side effects. Call your doctor for medical advice about side effects. You may report side effects to FDA at 1-800-FDA-1088. Where should I keep my medicine? Keep out of the reach of children. Store at room temperature between 20 and 25 degrees C (68 and 77 degrees F). Protect from light. Keep container tightly closed. Throw away any unused medicine after the expiration date. NOTE: This sheet is a summary. It may not cover all possible information. If you have questions about this medicine, talk to your doctor, pharmacist, or health care provider.  2015, [employer]vier/Gold Standard. (2011-09-23 13:04:38)  Hypertension Hypertension, commonly called high blood pressure, is when the force of blood pumping through your arteries is too strong. Your arteries are the blood vessels that carry blood from your heart throughout your body. A blood pressure reading consists of a higher number over a lower number, such as 110/72. The higher number (systolic) is the pressure inside your arteries when your heart pumps. The lower number (diastolic) is the pressure inside your arteries when your heart relaxes. Ideally you want your blood pressure below 120/80. Hypertension forces your heart to work harder to pump blood. Your arteries may become  narrow or stiff. Having hypertension puts you at risk for heart disease, stroke, and other problems.  RISK FACTORS Some risk factors for high blood pressure are controllable. Others are not.  Risk factors you cannot control include:   Race. You may be at higher risk if you are African American.  Age. Risk increases with age.  Gender. Men are at higher risk than women before age 41 years. After age 8, women are at higher risk than men. Risk factors you can control include:  Not getting enough exercise or physical activity.  Being overweight.  Getting too much fat, sugar, calories, or salt in your diet.  Drinking too much alcohol. SIGNS AND SYMPTOMS Hypertension does not usually cause signs or symptoms. Extremely high blood pressure (hypertensive crisis) may cause headache, anxiety, shortness of breath, and nosebleed. DIAGNOSIS  To check if you have hypertension, your health care provider will measure your blood pressure while you are seated, with your arm held at the level of your heart. It should be measured at least twice using the  same arm. Certain conditions can cause a difference in blood pressure between your right and left arms. A blood pressure reading that is higher than normal on one occasion does not mean that you need treatment. If one blood pressure reading is high, ask your health care provider about having it checked again. TREATMENT  Treating high blood pressure includes making lifestyle changes and possibly taking medication. Living a healthy lifestyle can help lower high blood pressure. You may need to change some of your habits. Lifestyle changes may include:  Following the DASH diet. This diet is high in fruits, vegetables, and whole grains. It is low in salt, red meat, and added sugars.  Getting at least 2 1/2 hours of brisk physical activity every week.  Losing weight if necessary.  Not smoking.  Limiting alcoholic beverages.  Learning ways to reduce  stress. If lifestyle changes are not enough to get your blood pressure under control, your health care provider may prescribe medicine. You may need to take more than one. Work closely with your health care provider to understand the risks and benefits. HOME CARE INSTRUCTIONS  Have your blood pressure rechecked as directed by your health care provider.   Only take medicine as directed by your health care provider. Follow the directions carefully. Blood pressure medicines must be taken as prescribed. The medicine does not work as well when you skip doses. Skipping doses also puts you at risk for problems.   Do not smoke.   Monitor your blood pressure at home as directed by your health care provider. SEEK MEDICAL CARE IF:   You think you are having a reaction to medicines taken.  You have recurrent headaches or feel dizzy.  You have swelling in your ankles.  You have trouble with your vision. SEEK IMMEDIATE MEDICAL CARE IF:  You develop a severe headache or confusion.  You have unusual weakness, numbness, or feel faint.  You have severe chest or abdominal pain.  You vomit repeatedly.  You have trouble breathing. MAKE SURE YOU:   Understand these instructions.  Will watch your condition.  Will get help right away if you are not doing well or get worse. Document Released: 05/26/2005 Document Revised: 05/31/2013 Document Reviewed: 03/18/2013 [facility] Patient Information 2015 [place], [place]. This information is not intended to replace advice given to you by your health care provider. Make sure you discuss any questions you have with your health care provider.

## 2014-01-26 ENCOUNTER — Inpatient Hospital Stay (HOSPITAL_COMMUNITY)
Admission: AD | Admit: 2014-01-26 | Discharge: 2014-01-26 | Disposition: A | Discharge status: Home / Self Care | Payer: Medicaid Other | Source: Ambulatory Visit | Attending: Family Medicine | Admitting: Family Medicine

## 2014-01-26 ENCOUNTER — Inpatient Hospital Stay (HOSPITAL_COMMUNITY): Payer: Medicaid Other

## 2014-01-26 ENCOUNTER — Encounter (HOSPITAL_COMMUNITY): Payer: Self-pay | Admitting: *Deleted

## 2014-01-26 DIAGNOSIS — R102 Pelvic and perineal pain: Secondary | ICD-10-CM

## 2014-01-26 DIAGNOSIS — N949 Unspecified condition associated with female genital organs and menstrual cycle: Secondary | ICD-10-CM | POA: Diagnosis not present

## 2014-01-26 DIAGNOSIS — F4322 Adjustment disorder with anxiety: Secondary | ICD-10-CM

## 2014-01-26 DIAGNOSIS — N938 Other specified abnormal uterine and vaginal bleeding: Secondary | ICD-10-CM | POA: Insufficient documentation

## 2014-01-26 DIAGNOSIS — I10 Essential (primary) hypertension: Secondary | ICD-10-CM | POA: Diagnosis not present

## 2014-01-26 DIAGNOSIS — A5901 Trichomonal vulvovaginitis: Secondary | ICD-10-CM | POA: Diagnosis not present

## 2014-01-26 DIAGNOSIS — N926 Irregular menstruation, unspecified: Secondary | ICD-10-CM | POA: Insufficient documentation

## 2014-01-26 DIAGNOSIS — K219 Gastro-esophageal reflux disease without esophagitis: Secondary | ICD-10-CM | POA: Insufficient documentation

## 2014-01-26 DIAGNOSIS — N390 Urinary tract infection, site not specified: Secondary | ICD-10-CM | POA: Diagnosis not present

## 2014-01-26 DIAGNOSIS — R109 Unspecified abdominal pain: Secondary | ICD-10-CM | POA: Diagnosis present

## 2014-01-26 DIAGNOSIS — Z87891 Personal history of nicotine dependence: Secondary | ICD-10-CM | POA: Diagnosis not present

## 2014-01-26 DIAGNOSIS — N925 Other specified irregular menstruation: Secondary | ICD-10-CM | POA: Insufficient documentation

## 2014-01-26 DIAGNOSIS — N72 Inflammatory disease of cervix uteri: Secondary | ICD-10-CM | POA: Insufficient documentation

## 2014-01-26 DIAGNOSIS — F341 Dysthymic disorder: Secondary | ICD-10-CM | POA: Diagnosis not present

## 2014-01-26 LAB — URINE MICROSCOPIC-ADD ON

## 2014-01-26 LAB — URINALYSIS, ROUTINE W REFLEX MICROSCOPIC
Glucose, UA: NEGATIVE mg/dL
Ketones, ur: 15 mg/dL — AB
Nitrite: POSITIVE — AB
Protein, ur: 100 mg/dL — AB
Specific Gravity, Urine: 1.025 (ref 1.005–1.030)
Urobilinogen, UA: 1 mg/dL (ref 0.0–1.0)
pH: 6 (ref 5.0–8.0)

## 2014-01-26 LAB — COMPREHENSIVE METABOLIC PANEL
ALT: 11 U/L (ref 0–35)
AST: 15 U/L (ref 0–37)
Albumin: 3.6 g/dL (ref 3.5–5.2)
Alkaline Phosphatase: 58 U/L (ref 39–117)
Anion gap: 8 (ref 5–15)
BUN: 7 mg/dL (ref 6–23)
CO2: 28 mEq/L (ref 19–32)
Calcium: 8.8 mg/dL (ref 8.4–10.5)
Chloride: 104 mEq/L (ref 96–112)
Creatinine, Ser: 0.65 mg/dL (ref 0.50–1.10)
GFR calc Af Amer: >90 mL/min (ref >90)
GFR calc non Af Amer: >90 mL/min (ref >90)
Glucose, Bld: 90 mg/dL (ref 70–99)
Potassium: 3.6 mEq/L — ABNORMAL LOW (ref 3.7–5.3)
Sodium: 140 mEq/L (ref 137–147)
Total Bilirubin: 0.4 mg/dL (ref 0.3–1.2)
Total Protein: 7.2 g/dL (ref 6.0–8.3)

## 2014-01-26 LAB — WET PREP, GENITAL
Clue Cells Wet Prep HPF POC: NONE SEEN
Yeast Wet Prep HPF POC: NONE SEEN

## 2014-01-26 LAB — CBC
HCT: 35.2 % — ABNORMAL LOW (ref 36.0–46.0)
Hemoglobin: 12.1 g/dL (ref 12.0–15.0)
MCH: 28.8 pg (ref 26.0–34.0)
MCHC: 34.4 g/dL (ref 30.0–36.0)
MCV: 83.8 fL (ref 78.0–100.0)
Platelets: 291 10*3/uL (ref 150–400)
RBC: 4.2 MIL/uL (ref 3.87–5.11)
RDW: 13.7 % (ref 11.5–15.5)
WBC: 10 10*3/uL (ref 4.0–10.5)

## 2014-01-26 MED ORDER — HYDROCHLOROTHIAZIDE 12.5 MG PO CAPS
12.5000 mg | ORAL_CAPSULE | Freq: Once | ORAL | Status: AC
  Administered 2014-01-26: 12.5 mg via ORAL
  Filled 2014-01-26: qty 1

## 2014-01-26 MED ORDER — AZITHROMYCIN 250 MG PO TABS
1000.0000 mg | ORAL_TABLET | Freq: Once | ORAL | Status: AC
Start: 2014-01-26 — End: 2014-01-26
  Administered 2014-01-26: 1000 mg via ORAL
  Filled 2014-01-26: qty 4

## 2014-01-26 MED ORDER — CITALOPRAM HYDROBROMIDE 20 MG PO TABS
20.0000 mg | ORAL_TABLET | Freq: Every day | ORAL | Status: DC
Start: 2014-01-26 — End: 2015-01-11

## 2014-01-26 MED ORDER — IBUPROFEN 800 MG PO TABS: 800.0000 mg | ORAL_TABLET | Freq: Three times a day (TID) | ORAL | Status: DC

## 2014-01-26 MED ORDER — METRONIDAZOLE 500 MG PO TABS
2000.0000 mg | ORAL_TABLET | Freq: Once | ORAL | Status: AC
  Administered 2014-01-26: 2000 mg via ORAL
  Filled 2014-01-26: qty 4

## 2014-01-26 MED ORDER — CEFTRIAXONE SODIUM 250 MG IJ SOLR
250.0000 mg | Freq: Once | INTRAMUSCULAR | Status: AC
Start: 2014-01-26 — End: 2014-01-26
  Administered 2014-01-26: 250 mg via INTRAMUSCULAR
  Filled 2014-01-26: qty 250

## 2014-01-26 MED ORDER — HYDROCHLOROTHIAZIDE 25 MG PO TABS: 25.0000 mg | ORAL_TABLET | Freq: Every day | ORAL | Status: DC

## 2014-01-26 MED ORDER — DOXYCYCLINE HYCLATE 100 MG PO CAPS
100.0000 mg | ORAL_CAPSULE | Freq: Two times a day (BID) | ORAL | Status: DC
Start: 2014-01-26 — End: 2015-01-11

## 2014-01-26 MED ORDER — NITROFURANTOIN MONOHYD MACRO 100 MG PO CAPS: 100.0000 mg | ORAL_CAPSULE | Freq: Two times a day (BID) | ORAL | Status: DC

## 2014-01-26 MED ORDER — KETOROLAC TROMETHAMINE 60 MG/2ML IM SOLN
60.0000 mg | Freq: Once | INTRAMUSCULAR | Status: AC
Start: 2014-01-26 — End: 2014-01-26
  Administered 2014-01-26: 60 mg via INTRAMUSCULAR
  Filled 2014-01-26: qty 2
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
## 2014-01-26 NOTE — MAU Note (Signed)
Pt states she took ibuprofen 600 mg this am at 0800 and the pain went away.

## 2014-01-26 NOTE — MAU Note (Signed)
Pt presents to MAU with c/o lower abdominal pain. Pt states she is having a period and she had a period two weeks ago. Pt states this episode is the same as a regular period.

## 2014-01-27 LAB — GC/CHLAMYDIA PROBE AMP
CT Probe RNA: NEGATIVE
GC Probe RNA: NEGATIVE

## 2014-01-27 NOTE — MAU Provider Note (Signed)
Attestation of Attending Supervision of Advanced Practitioner (PA/CNM/NP): Evaluation and management procedures were performed by the Advanced Practitioner under my supervision and collaboration.  I have reviewed the Advanced Practitioner's note and chart, and I agree with the management and plan.  Donnamae Jude, MD [facility] Attending 01/27/2014 8:42 AM

## 2014-01-28 LAB — URINE CULTURE: Colony Count: 45000

## 2014-01-31 ENCOUNTER — Encounter: Payer: Self-pay | Admitting: Family Medicine

## 2014-03-22 ENCOUNTER — Encounter: Payer: Medicaid Other | Admitting: Family Medicine

## 2014-04-10 ENCOUNTER — Encounter (HOSPITAL_COMMUNITY): Payer: Self-pay | Admitting: *Deleted

## 2014-04-21 ENCOUNTER — Encounter: Payer: Medicaid Other | Admitting: Family Medicine

## 2014-04-24 ENCOUNTER — Encounter: Payer: Medicaid Other | Admitting: Family Medicine

## 2014-06-05 ENCOUNTER — Encounter: Payer: Self-pay | Admitting: *Deleted

## 2014-06-06 ENCOUNTER — Encounter: Payer: Self-pay | Admitting: Obstetrics & Gynecology

## 2014-06-16 ENCOUNTER — Encounter: Payer: Medicaid Other | Admitting: Family Medicine

## 2014-06-18 ENCOUNTER — Emergency Department (HOSPITAL_COMMUNITY)
Admission: EM | Admit: 2014-06-18 | Discharge: 2014-06-18 | Disposition: A | Discharge status: Home / Self Care | Payer: Medicaid Other | Attending: Emergency Medicine | Admitting: Emergency Medicine

## 2014-06-18 ENCOUNTER — Encounter (HOSPITAL_COMMUNITY): Payer: Self-pay | Admitting: *Deleted

## 2014-06-18 DIAGNOSIS — I10 Essential (primary) hypertension: Secondary | ICD-10-CM | POA: Insufficient documentation

## 2014-06-18 DIAGNOSIS — Z3202 Encounter for pregnancy test, result negative: Secondary | ICD-10-CM | POA: Insufficient documentation

## 2014-06-18 DIAGNOSIS — Z8709 Personal history of other diseases of the respiratory system: Secondary | ICD-10-CM | POA: Diagnosis not present

## 2014-06-18 DIAGNOSIS — F419 Anxiety disorder, unspecified: Secondary | ICD-10-CM | POA: Diagnosis not present

## 2014-06-18 DIAGNOSIS — Z87891 Personal history of nicotine dependence: Secondary | ICD-10-CM | POA: Insufficient documentation

## 2014-06-18 DIAGNOSIS — Z8742 Personal history of other diseases of the female genital tract: Secondary | ICD-10-CM | POA: Diagnosis not present

## 2014-06-18 DIAGNOSIS — Z8619 Personal history of other infectious and parasitic diseases: Secondary | ICD-10-CM | POA: Insufficient documentation

## 2014-06-18 DIAGNOSIS — F329 Major depressive disorder, single episode, unspecified: Secondary | ICD-10-CM | POA: Insufficient documentation

## 2014-06-18 DIAGNOSIS — F10120 Alcohol abuse with intoxication, uncomplicated: Secondary | ICD-10-CM

## 2014-06-18 DIAGNOSIS — R51 Headache: Secondary | ICD-10-CM | POA: Insufficient documentation

## 2014-06-18 DIAGNOSIS — Z79899 Other long term (current) drug therapy: Secondary | ICD-10-CM | POA: Insufficient documentation

## 2014-06-18 DIAGNOSIS — F10129 Alcohol abuse with intoxication, unspecified: Secondary | ICD-10-CM | POA: Insufficient documentation

## 2014-06-18 DIAGNOSIS — R112 Nausea with vomiting, unspecified: Secondary | ICD-10-CM | POA: Diagnosis present

## 2014-06-18 LAB — CBC WITH DIFFERENTIAL/PLATELET
Basophils Absolute: 0 10*3/uL (ref 0.0–0.1)
Basophils Relative: 0 % (ref 0–1)
Eosinophils Absolute: 0 10*3/uL (ref 0.0–0.7)
Eosinophils Relative: 0 % (ref 0–5)
HCT: 37.3 % (ref 36.0–46.0)
Hemoglobin: 12.2 g/dL (ref 12.0–15.0)
Lymphocytes Relative: 25 % (ref 12–46)
Lymphs Abs: 2.9 10*3/uL (ref 0.7–4.0)
MCH: 26.9 pg (ref 26.0–34.0)
MCHC: 32.7 g/dL (ref 30.0–36.0)
MCV: 82.2 fL (ref 78.0–100.0)
Monocytes Absolute: 0.4 10*3/uL (ref 0.1–1.0)
Monocytes Relative: 4 % (ref 3–12)
Neutro Abs: 8.3 10*3/uL — ABNORMAL HIGH (ref 1.7–7.7)
Neutrophils Relative %: 71 % (ref 43–77)
Platelets: 365 10*3/uL (ref 150–400)
RBC: 4.54 MIL/uL (ref 3.87–5.11)
RDW: 13.5 % (ref 11.5–15.5)
WBC: 11.7 10*3/uL — ABNORMAL HIGH (ref 4.0–10.5)

## 2014-06-18 LAB — COMPREHENSIVE METABOLIC PANEL
ALT: 18 U/L (ref 0–35)
AST: 24 U/L (ref 0–37)
Albumin: 4.4 g/dL (ref 3.5–5.2)
Alkaline Phosphatase: 51 U/L (ref 39–117)
Anion gap: 9 (ref 5–15)
BUN: 8 mg/dL (ref 6–23)
CO2: 25 mmol/L (ref 19–32)
Calcium: 8.7 mg/dL (ref 8.4–10.5)
Chloride: 106 mEq/L (ref 96–112)
Creatinine, Ser: 0.55 mg/dL (ref 0.50–1.10)
GFR calc Af Amer: >90 mL/min (ref >90)
GFR calc non Af Amer: >90 mL/min (ref >90)
Glucose, Bld: 97 mg/dL (ref 70–99)
Potassium: 3.5 mmol/L (ref 3.5–5.1)
Sodium: 140 mmol/L (ref 135–145)
Total Bilirubin: 0.5 mg/dL (ref 0.3–1.2)
Total Protein: 8.4 g/dL — ABNORMAL HIGH (ref 6.0–8.3)

## 2014-06-18 LAB — POC URINE PREG, ED: Preg Test, Ur: NEGATIVE

## 2014-06-18 MED ORDER — ONDANSETRON HCL 4 MG/2ML IJ SOLN
4.0000 mg | Freq: Once | INTRAMUSCULAR | Status: DC
  Filled 2014-06-18: qty 2

## 2014-06-18 MED ORDER — KETOROLAC TROMETHAMINE 30 MG/ML IJ SOLN
30.0000 mg | Freq: Once | INTRAMUSCULAR | Status: DC
  Filled 2014-06-18: qty 1

## 2014-06-18 MED ORDER — SODIUM CHLORIDE 0.9 % IV BOLUS (SEPSIS)
1000.0000 mL | Freq: Once | INTRAVENOUS | Status: AC
  Administered 2014-06-18: 1000 mL via INTRAVENOUS

## 2014-06-18 NOTE — ED Notes (Signed)
Pt in from home. Reported to ems that she drank too much etoh last night and c/o n/v, HA.

## 2014-06-18 NOTE — ED Notes (Signed)
Bed: [facility] Expected date:  Expected time:  Means of arrival:  Comments: 

## 2014-06-18 NOTE — Discharge Instructions (Signed)
Alcohol and Nutrition Nutrition serves two purposes. It provides energy. It also maintains body structure and function. Food supplies energy. It also provides the building blocks needed to replace worn or damaged cells. Alcoholics often eat poorly. This limits their supply of essential nutrients. This affects energy supply and structure maintenance. Alcohol also affects the body's nutrients in:  Digestion.  Storage.  Using and getting rid of waste products. IMPAIRMENT OF NUTRIENT DIGESTION AND UTILIZATION   Once ingested, food must be broken down into small components (digested). Then it is available for energy. It helps maintain body structure and function. Digestion begins in the mouth. It continues in the stomach and intestines, with help from the pancreas. The nutrients from digested food are absorbed from the intestines into the blood. Then they are carried to the liver. The liver prepares nutrients for:  Immediate use.  Storage and future use.  Alcohol inhibits the breakdown of nutrients into usable molecules.  It decreases secretion of digestive enzymes from the pancreas.  Alcohol impairs nutrient absorption by damaging the cells lining the stomach and intestines.  It also interferes with moving some nutrients into the blood.  In addition, nutritional deficiencies themselves may lead to further absorption problems.  For example, folate deficiency changes the cells that line the small intestine. This impairs how water is absorbed. It also affects absorbed nutrients. These include glucose, sodium, and additional folate.  Even if nutrients are digested and absorbed, alcohol can prevent them from being fully used. It changes their transport, storage, and excretion. Impaired utilization of nutrients by alcoholics is indicated by:  Decreased liver stores of vitamins, such as vitamin A.  Increased excretion of nutrients such as fat. ALCOHOL AND ENERGY SUPPLY   Three basic  nutritional components found in food are:  Carbohydrates.  Proteins.  Fats.  These are used as energy. Some alcoholics take in as much as 50% of their total daily calories from alcohol. They often neglect important foods.  Even when enough food is eaten, alcohol can impair the ways the body controls blood sugar (glucose) levels. It may either increase or decrease blood sugar.  In non-diabetic alcoholics, increased blood sugar (hyperglycemia) is caused by poor insulin secretion. It is usually temporary.  Decreased blood sugar (hypoglycemia) can cause serious injury even if this condition is short-lived. Low blood sugar can happen when a fasting or malnourished person drinks alcohol. When there is no food to supply energy, stored sugar is used up. The products of alcohol inhibit forming glucose from other compounds such as amino acids. As a result, alcohol causes the brain and other body tissue to lack glucose. It is needed for energy and function.  Alcohol is an energy source. But how the body processes and uses the energy from alcohol is complex. Also, when alcohol is substituted for carbohydrates, subjects tend to lose weight. This indicates that they get less energy from alcohol than from food. ALCOHOL - MAINTAINING CELL STRUCTURE AND FUNCTION  Structure Cells are made mostly of protein. So an adequate protein diet is important for maintaining cell structure. This is especially true if cells are being damaged. Research indicates that alcohol affects protein nutrition by causing impaired:  Digestion of proteins to amino acids.  Processing of amino acids by the small intestine and liver.  Synthesis of proteins from amino acids.  Protein secretion by the liver. Function Nutrients are essential for the body to function well. They provide the tools that the body needs to work well:  Proteins.  Vitamins.  Minerals. Alcohol can disrupt body function. It may cause nutrient  deficiencies. And it may interfere with the way nutrients are processed. Vitamins  Vitamins are essential to maintain growth and normal metabolism. They regulate many of the body`s processes. Chronic heavy drinking causes deficiencies in many vitamins. This is caused by eating less. And, in some cases, vitamins may be poorly absorbed. For example, alcohol inhibits fat absorption. It impairs how the vitamins A, E, and D are normally absorbed along with dietary fats. Not enough vitamin A may cause night blindness. Not enough vitamin D may cause softening of the bones.  Some alcoholics lack vitamins A, C, D, E, K, and the B vitamins. These are all involved in wound healing and cell maintenance. In particular, because vitamin K is necessary for blood clotting, lacking that vitamin can cause delayed clotting. The result is excess bleeding. Lacking other vitamins involved in brain function may cause severe neurological damage. Minerals Deficiencies of minerals such as calcium, magnesium, iron, and zinc are common in alcoholics. The alcohol itself does not seem to affect how these minerals are absorbed. Rather, they seem to occur secondary to other alcohol-related problems, such as:  Less calcium absorbed.  Not enough magnesium.  More urinary excretion.  Vomiting.  Diarrhea.  Not enough iron due to gastrointestinal bleeding.  Not enough zinc or losses related to other nutrient deficiencies.  Mineral deficiencies can cause a variety of medical consequences. These range from calcium-related bone disease to zinc-related night blindness and skin lesions. ALCOHOL, MALNUTRITION, AND MEDICAL COMPLICATIONS  Liver Disease   Alcoholic liver damage is caused primarily by alcohol itself. But poor nutrition may increase the risk of alcohol-related liver damage. For example, nutrients normally found in the liver are known to be affected by drinking alcohol. These include carotenoids, which are the major  sources of vitamin A, and vitamin E compounds. Decreases in such nutrients may play some role in alcohol-related liver damage. Pancreatitis  Research suggests that malnutrition may increase the risk of developing alcoholic pancreatitis. Research suggests that a diet lacking in protein may increase alcohol's damaging effect on the pancreas. Brain  Nutritional deficiencies may have severe effects on brain function. These may be permanent. Specifically, thiamine deficiencies are often seen in alcoholics. They can cause severe neurological problems. These include:  Impaired movement.  Memory loss seen in Wernicke-Korsakoff syndrome. Pregnancy  Alcohol has toxic effects on fetal development. It causes alcohol-related birth defects. They include fetal alcohol syndrome. Alcohol itself is toxic to the fetus. Also, the nutritional deficiency can affect how the fetus develops. That may compound the risk of developmental damage.  Nutritional needs during pregnancy are 10% to 30% greater than normal. Food intake can increase by as much as 140% to cover the needs of both mother and fetus. An alcoholic mother`s nutritional problems may adversely affect the nutrition of the fetus. And alcohol itself can also restrict nutrition flow to the fetus. NUTRITIONAL STATUS OF ALCOHOLICS  Techniques for assessing nutritional status include:  Taking body measurements to estimate fat reserves. They include:  Weight.  Height.  Mass.  Skin fold thickness.  Performing blood analysis to provide measurements of circulating:  Proteins.  Vitamins.  Minerals.  These techniques tend to be imprecise. For many nutrients, there is no clear "cut-off" point that would allow an accurate definition of deficiency. So assessing the nutritional status of alcoholics is limited by these techniques. Dietary status may provide information about the risk of developing nutritional problems.  Dietary status is assessed by:  Taking  patients' dietary histories.  Evaluating the amount and types of food they are eating.  It is difficult to determine what exact amount of alcohol begins to have damaging effects on nutrition. In general, moderate drinkers have 2 drinks or less per day. They seem to be at little risk for nutritional problems. Various medical disorders begin to appear at greater levels.  Research indicates that the majority of even the heaviest drinkers have few obvious nutritional deficiencies. Many alcoholics who are hospitalized for medical complications of their disease do have severe malnutrition. Alcoholics tend to eat poorly. Often they eat less than the amounts of food necessary to provide enough:  Carbohydrates.  Protein.  Fat.  Vitamins A and C.  B vitamins.  Minerals like calcium and iron. Of major concern is alcohol's effect on digesting food and use of nutrients. It may shift a mildly malnourished person toward severe malnutrition. Document Released: 03/20/2005 Document Revised: 08/18/2011 Document Reviewed: 09/03/2005 [facility] Patient Information 2015 [place], [place]. This information is not intended to replace advice given to you by your health care provider. Make sure you discuss any questions you have with your health care provider.  Alcohol Intoxication Alcohol intoxication occurs when the amount of alcohol that a person has consumed impairs his or her ability to mentally and physically function. Alcohol directly impairs the normal chemical activity of the brain. Drinking large amounts of alcohol can lead to changes in mental function and behavior, and it can cause many physical effects that can be harmful.  Alcohol intoxication can range in severity from mild to very severe. Various factors can affect the level of intoxication that occurs, such as the person's age, gender, weight, frequency of alcohol consumption, and the presence of other medical conditions (such as diabetes, seizures, or  heart conditions). Dangerous levels of alcohol intoxication may occur when people drink large amounts of alcohol in a short period (binge drinking). Alcohol can also be especially dangerous when combined with certain prescription medicines or "recreational" drugs. SIGNS AND SYMPTOMS Some common signs and symptoms of mild alcohol intoxication include:  Loss of coordination.  Changes in mood and behavior.  Impaired judgment.  Slurred speech. As alcohol intoxication progresses to more severe levels, other signs and symptoms will appear. These may include:  Vomiting.  Confusion and impaired memory.  Slowed breathing.  Seizures.  Loss of consciousness. DIAGNOSIS  Your health care provider will take a medical history and perform a physical exam. You will be asked about the amount and type of alcohol you have consumed. Blood tests will be done to measure the concentration of alcohol in your blood. In many places, your blood alcohol level must be lower than 80 mg/dL (0.08%) to legally drive. However, many dangerous effects of alcohol can occur at much lower levels.  TREATMENT  People with alcohol intoxication often do not require treatment. Most of the effects of alcohol intoxication are temporary, and they go away as the alcohol naturally leaves the body. Your health care provider will monitor your condition until you are stable enough to go home. Fluids are sometimes given through an IV access tube to help prevent dehydration.  HOME CARE INSTRUCTIONS  Do not drive after drinking alcohol.  Stay hydrated. Drink enough water and fluids to keep your urine clear or pale yellow. Avoid caffeine.   Only take over-the-counter or prescription medicines as directed by your health care provider.  SEEK MEDICAL CARE IF:   You have persistent  vomiting.   You do not feel better after a few days.  You have frequent alcohol intoxication. Your health care provider can help determine if you should  see a substance use treatment counselor. SEEK IMMEDIATE MEDICAL CARE IF:   You become shaky or tremble when you try to stop drinking.   You shake uncontrollably (seizure).   You throw up (vomit) blood. This may be bright red or may look like black coffee grounds.   You have blood in your stool. This may be bright red or may appear as a black, tarry, bad smelling stool.   You become lightheaded or faint.  MAKE SURE YOU:   Understand these instructions.  Will watch your condition.  Will get help right away if you are not doing well or get worse. Document Released: 03/05/2005 Document Revised: 01/26/2013 Document Reviewed: 10/29/2012 [facility] Patient Information 2015 [place], [place]. This information is not intended to replace advice given to you by your health care provider. Make sure you discuss any questions you have with your health care provider.  Alcohol Use Disorder Alcohol use disorder is a mental disorder. It is not a one-time incident of heavy drinking. Alcohol use disorder is the excessive and uncontrollable use of alcohol over time that leads to problems with functioning in one or more areas of daily living. People with this disorder risk harming themselves and others when they drink to excess. Alcohol use disorder also can cause other mental disorders, such as mood and anxiety disorders, and serious physical problems. People with alcohol use disorder often misuse other drugs.  Alcohol use disorder is common and widespread. Some people with this disorder drink alcohol to cope with or escape from negative life events. Others drink to relieve chronic pain or symptoms of mental illness. People with a family history of alcohol use disorder are at higher risk of losing control and using alcohol to excess.  SYMPTOMS  Signs and symptoms of alcohol use disorder may include the following:   Consumption ofalcohol inlarger amounts or over a longer period of time than  intended.  Multiple unsuccessful attempts to cutdown or control alcohol use.   A great deal of time spent obtaining alcohol, using alcohol, or recovering from the effects of alcohol (hangover).  A strong desire or urge to use alcohol (cravings).   Continued use of alcohol despite problems at work, school, or home because of alcohol use.   Continued use of alcohol despite problems in relationships because of alcohol use.  Continued use of alcohol in situations when it is physically hazardous, such as driving a car.  Continued use of alcohol despite awareness of a physical or psychological problem that is likely related to alcohol use. Physical problems related to alcohol use can involve the brain, heart, liver, stomach, and intestines. Psychological problems related to alcohol use include intoxication, depression, anxiety, psychosis, delirium, and dementia.   The need for increased amounts of alcohol to achieve the same desired effect, or a decreased effect from the consumption of the same amount of alcohol (tolerance).  Withdrawal symptoms upon reducing or stopping alcohol use, or alcohol use to reduce or avoid withdrawal symptoms. Withdrawal symptoms include:  Racing heart.  Hand tremor.  Difficulty sleeping.  Nausea.  Vomiting.  Hallucinations.  Restlessness.  Seizures. DIAGNOSIS Alcohol use disorder is diagnosed through an assessment by your health care provider. Your health care provider may start by asking three or four questions to screen for excessive or problematic alcohol use. To confirm a diagnosis  of alcohol use disorder, at least two symptoms must be present within a 27-month period. The severity of alcohol use disorder depends on the number of symptoms:  Mild--two or three.  Moderate--four or five.  Severe--six or more. Your health care provider may perform a physical exam or use results from lab tests to see if you have physical problems resulting from  alcohol use. Your health care provider may refer you to a mental health professional for evaluation. TREATMENT  Some people with alcohol use disorder are able to reduce their alcohol use to low-risk levels. Some people with alcohol use disorder need to quit drinking alcohol. When necessary, mental health professionals with specialized training in substance use treatment can help. Your health care provider can help you decide how severe your alcohol use disorder is and what type of treatment you need. The following forms of treatment are available:   Detoxification. Detoxification involves the use of prescription medicines to prevent alcohol withdrawal symptoms in the first week after quitting. This is important for people with a history of symptoms of withdrawal and for heavy drinkers who are likely to have withdrawal symptoms. Alcohol withdrawal can be dangerous and, in severe cases, cause death. Detoxification is usually provided in a hospital or in-patient substance use treatment facility.  Counseling or talk therapy. Talk therapy is provided by substance use treatment counselors. It addresses the reasons people use alcohol and ways to keep them from drinking again. The goals of talk therapy are to help people with alcohol use disorder find healthy activities and ways to cope with life stress, to identify and avoid triggers for alcohol use, and to handle cravings, which can cause relapse.  Medicines.Different medicines can help treat alcohol use disorder through the following actions:  Decrease alcohol cravings.  Decrease the positive reward response felt from alcohol use.  Produce an uncomfortable physical reaction when alcohol is used (aversion therapy).  Support groups. Support groups are run by people who have quit drinking. They provide emotional support, advice, and guidance. These forms of treatment are often combined. Some people with alcohol use disorder benefit from intensive  combination treatment provided by specialized substance use treatment centers. Both inpatient and outpatient treatment programs are available. Document Released: 07/03/2004 Document Revised: 10/10/2013 Document Reviewed: 09/02/2012 [facility] Patient Information 2015 [place], [place]. This information is not intended to replace advice given to you by your health care provider. Make sure you discuss any questions you have with your health care provider.

## 2014-06-18 NOTE — ED Provider Notes (Signed)
CSN: [id number]     Arrival date & time 06/18/14  1434 History   First MD Initiated Contact with Patient 06/18/14 1557     Chief Complaint  Patient presents with  . Nausea  . Emesis  . Headache     (Consider location/radiation/quality/duration/timing/severity/associated sxs/prior Treatment) HPI Pt is a 29yo female with hx of HTN, anxiety and depression, presenting to ED with c/o headache, associated with nausea and vomiting that started earlier this morning when she woke up. Pt reports heavy drinking of "straight tequila" last night as well as "a few drinks at the club."  Pt states "I think I drank too much last night."  Headache is frontal, constant, aching, 5/10.  HA was gradual in onset, feels similar to previous headaches but more severe. No pain medication PTA as pt states she was unable to find her tylenol or motrin at home.  Reports vomiting 4 times today, no blood in emesis but states emesis was yellow in color.  Denies fever, chills, or diarrhea. Denies abdominal pain.  Denies urinary or vaginal symptoms. Abdominal surgical hx significant for cholecystectomy and umbilical hernia repair. No sick contacts or recent travel. Denies SI/HI. Denies use of recreational or prescription drugs while drinking. Denies having a drinking problem. States she drinks socially.   Past Medical History  Diagnosis Date  . Bronchitis   . Hypertension   . Acid reflux   . Pregnancy induced hypertension   . Urinary tract infection   . History of PID   . Abnormal Pap smear     colposcopy, mild dysplasia, HPV  . Gonorrhea   . Chlamydia   . Trichomonas   . Depression     on meds for bipolar  . Anxiety     on meds- stable  . Obesity   . Sleep apnea     hasnt uses CPAP x 4 years  . Fibroid    Past Surgical History  Procedure Laterality Date  . Wisdom tooth extraction    . Foot surgery    . Tubal ligation    . Cesarean section  05/02/03,05/04/08,10/23/10    x3  . Cholecystectomy N/A 10/07/2012     Procedure: LAPAROSCOPIC CHOLECYSTECTOMY WITH INTRAOPERATIVE CHOLANGIOGRAM;  Surgeon: Imogene Burn. Georgette Dover, MD;  Location: [facility];  Service: General;  Laterality: N/A;  . Umbilical hernia repair N/A 12/02/2012    Procedure: LAPAROSCOPIC UMBILICAL HERNIA;  Surgeon: Adin Hector, MD;  Location: [facility];  Service: General;  Laterality: N/A;  . Insertion of mesh N/A 12/02/2012    Procedure: INSERTION OF MESH;  Surgeon: Adin Hector, MD;  Location: [facility];  Service: General;  Laterality: N/A;   Family History  Problem Relation Age of Onset  . Other Neg Hx   . Hypertension Mother   . Heart disease Father   . Stroke Father    History  Substance Use Topics  . Smoking status: Former Smoker -- 0.25 packs/day for 1 years    Types: Cigarettes  . Smokeless tobacco: Never Used  . Alcohol Use: No     Comment: socially   OB History    Gravida Para Term Preterm AB TAB SAB Ectopic Multiple Living   3 3 3       3      Review of Systems  Constitutional: Negative for fever and chills.  Respiratory: Negative for cough and shortness of breath.   Gastrointestinal: Positive for nausea and vomiting. Negative for abdominal pain, diarrhea and constipation.  Genitourinary: Negative for  dysuria, hematuria and flank pain.  Neurological: Positive for headaches. Negative for dizziness, syncope, light-headedness and numbness.  All other systems reviewed and are negative.     Allergies  Review of patient's allergies indicates no known allergies.  Home Medications   Prior to Admission medications   Medication Sig Start Date End Date Taking? Authorizing Provider  acetaminophen (TYLENOL) 500 MG tablet Take 1,000 mg by mouth every 6 (six) hours as needed for moderate pain (pain).   Yes Historical Provider, MD  hydrochlorothiazide (HYDRODIURIL) 25 MG tablet Take 1 tablet (25 mg total) by mouth daily. 01/26/14  Yes West Pugh, NP  citalopram (CELEXA) 20 MG tablet Take 1 tablet (20 mg total) by mouth  daily. Patient not taking: Reported on 06/18/2014 01/26/14   West Pugh, NP  doxycycline (VIBRAMYCIN) 100 MG capsule Take 1 capsule (100 mg total) by mouth 2 (two) times daily. Patient not taking: Reported on 06/18/2014 01/26/14   West Pugh, NP  ibuprofen (ADVIL,MOTRIN) 200 MG tablet Take 200 mg by mouth every 6 (six) hours as needed.    Historical Provider, MD  ibuprofen (ADVIL,MOTRIN) 800 MG tablet Take 1 tablet (800 mg total) by mouth 3 (three) times daily. Patient not taking: Reported on 06/18/2014 01/26/14   West Pugh, NP  nitrofurantoin, macrocrystal-monohydrate, (MACROBID) 100 MG capsule Take 1 capsule (100 mg total) by mouth 2 (two) times daily. Patient not taking: Reported on 06/18/2014 01/26/14   West Pugh, NP   BP 136/92 mmHg  Pulse 82  Temp(Src) 98.2 F (36.8 C) (Oral)  Resp 14  SpO2 100% Physical Exam  Constitutional: She is oriented to person, place, and time. She appears well-developed and well-nourished. No distress.  Morbidly obese female lying in exam bed, NAD.   HENT:  Head: Normocephalic and atraumatic.  Moist mucous membranes  Eyes: Conjunctivae and EOM are normal. Pupils are equal, round, and reactive to light. No scleral icterus.  Neck: Normal range of motion. Neck supple.  No nuchal rigidity or meningeal signs.  Cardiovascular: Normal rate, regular rhythm and normal heart sounds.   Pulmonary/Chest: Effort normal and breath sounds normal. No respiratory distress. She has no wheezes. She has no rales. She exhibits no tenderness.  Abdominal: Soft. Bowel sounds are normal. She exhibits no distension and no mass. There is no tenderness. There is no rebound and no guarding.  Obese abdomen, soft, non-tender.  Musculoskeletal: Normal range of motion.  Neurological: She is alert and oriented to person, place, and time. No cranial nerve deficit. Coordination normal.  Skin: Skin is warm and dry. She is not diaphoretic.  Nursing note and vitals  reviewed.   ED Course  Procedures (including critical care time) Labs Review Labs Reviewed  COMPREHENSIVE METABOLIC PANEL - Abnormal; Notable for the following:    Total Protein 8.4 (*)    All other components within normal limits  CBC WITH DIFFERENTIAL - Abnormal; Notable for the following:    WBC 11.7 (*)    Neutro Abs 8.3 (*)    All other components within normal limits  POC URINE PREG, ED    Imaging Review No results found.   EKG Interpretation None      MDM   Final diagnoses:  Hangover without complication    Pt presenting to ED with c/o frontal headache associated with nausea and vomiting after a night of "heavy" alcohol consumption.  Denies SI/HI. Denies use of recreational or prescription drugs while drinking.  Denies having an alcohol problem. States  she drinks socially. Pt is afebrile, non-toxic appearing. NAD. Appears well hydrated. BP is elevated- 153/106. Pt has hx of HTN, has not taken her BP medications today. Will get CBC and CMP as pt reports yellow colored emesis, however, low concern for surgical abdomen. Will tx with IV fluids, toradol, and zofran. Doubt emergent process taking place at this time including but not limited to [facility], temporal arteritis, meningitis, or surgical abdomen.   6:07 PM Pt states she feels better and feels comfortable being discharged home. She has been able to keep down 4 cups of water and some crackers. Pt states her headache has resolved.   Will discharge home with home care instructions. Advised to f/u with PCP as needed. Return precautions provided. Pt verbalized understanding and agreement with tx plan.   Noland Fordyce, PA-C 06/18/14 1919  Tanna Furry, MD 06/27/14 [phone]

## 2014-07-31 ENCOUNTER — Encounter: Payer: Medicaid Other | Admitting: Obstetrics & Gynecology

## 2014-09-08 ENCOUNTER — Ambulatory Visit: Payer: Medicaid Other | Admitting: Certified Nurse Midwife

## 2014-11-25 ENCOUNTER — Emergency Department (HOSPITAL_COMMUNITY)
Admission: EM | Admit: 2014-11-25 | Discharge: 2014-11-26 | Disposition: A | Discharge status: Home / Self Care | Payer: Medicaid Other | Attending: Emergency Medicine | Admitting: Emergency Medicine

## 2014-11-25 ENCOUNTER — Encounter (HOSPITAL_COMMUNITY): Payer: Self-pay | Admitting: Emergency Medicine

## 2014-11-25 DIAGNOSIS — I10 Essential (primary) hypertension: Secondary | ICD-10-CM | POA: Diagnosis not present

## 2014-11-25 DIAGNOSIS — Z8719 Personal history of other diseases of the digestive system: Secondary | ICD-10-CM | POA: Diagnosis not present

## 2014-11-25 DIAGNOSIS — Z87891 Personal history of nicotine dependence: Secondary | ICD-10-CM | POA: Insufficient documentation

## 2014-11-25 DIAGNOSIS — Z8709 Personal history of other diseases of the respiratory system: Secondary | ICD-10-CM | POA: Insufficient documentation

## 2014-11-25 DIAGNOSIS — H53149 Visual discomfort, unspecified: Secondary | ICD-10-CM | POA: Diagnosis not present

## 2014-11-25 DIAGNOSIS — Z86018 Personal history of other benign neoplasm: Secondary | ICD-10-CM | POA: Diagnosis not present

## 2014-11-25 DIAGNOSIS — F419 Anxiety disorder, unspecified: Secondary | ICD-10-CM | POA: Diagnosis not present

## 2014-11-25 DIAGNOSIS — R200 Anesthesia of skin: Secondary | ICD-10-CM | POA: Insufficient documentation

## 2014-11-25 DIAGNOSIS — E669 Obesity, unspecified: Secondary | ICD-10-CM | POA: Diagnosis not present

## 2014-11-25 DIAGNOSIS — Z8619 Personal history of other infectious and parasitic diseases: Secondary | ICD-10-CM | POA: Diagnosis not present

## 2014-11-25 DIAGNOSIS — Z87440 Personal history of urinary (tract) infections: Secondary | ICD-10-CM | POA: Insufficient documentation

## 2014-11-25 DIAGNOSIS — F329 Major depressive disorder, single episode, unspecified: Secondary | ICD-10-CM | POA: Diagnosis not present

## 2014-11-25 DIAGNOSIS — Z8742 Personal history of other diseases of the female genital tract: Secondary | ICD-10-CM | POA: Insufficient documentation

## 2014-11-25 DIAGNOSIS — R51 Headache: Secondary | ICD-10-CM | POA: Diagnosis present

## 2014-11-25 DIAGNOSIS — R519 Headache, unspecified: Secondary | ICD-10-CM

## 2014-11-25 MED ORDER — KETOROLAC TROMETHAMINE 30 MG/ML IJ SOLN
30.0000 mg | Freq: Once | INTRAMUSCULAR | Status: DC
  Filled 2014-11-25: qty 1

## 2014-11-25 MED ORDER — SODIUM CHLORIDE 0.9 % IV BOLUS (SEPSIS)
1000.0000 mL | Freq: Once | INTRAVENOUS | Status: AC
  Administered 2014-11-25: 1000 mL via INTRAVENOUS

## 2014-11-25 MED ORDER — METOCLOPRAMIDE HCL 5 MG/ML IJ SOLN
10.0000 mg | INTRAMUSCULAR | Status: DC
  Filled 2014-11-25: qty 2

## 2014-11-25 NOTE — ED Notes (Signed)
Per EMS pt complains of a headache. 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 She believes she may be dehydrated.

## 2014-11-25 NOTE — ED Provider Notes (Signed)
CSN: [id number]     Arrival date & time 11/25/14  2217 History  This chart was scribed for Antonietta Breach, PA-C, working with Dorie Rank, MD, by Stephania Fragmin, ED Scribe. This patient was seen in room [facility] and the patient's care was started at 10:25 PM.    Chief Complaint  Patient presents with  . Headache   Patient is a 29 y.o. female presenting with headaches. The history is provided by the patient. No language interpreter was used.  Headache Pain location:  Generalized Quality: throbbing. Radiates to:  Does not radiate Onset quality:  Gradual Duration:  10 hours Timing:  Constant Progression:  Worsening Chronicity:  New Context: eating (sodium)   Context comment:  Drinking soda Relieved by:  None tried Worsened by:  Nothing Ineffective treatments:  None tried Associated symptoms: numbness and photophobia   Associated symptoms: no blurred vision, no fever, no hearing loss, no nausea, no visual change, no vomiting and no weakness     HPI Comments: [name] is a 29 y.o. female brought in by ambulance, who presents to the Emergency Department complaining of a constant generalized, throbbing, "flushing" headache that began about 10 hours ago. 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 She states she may have also eaten a lot of salt today, and thinks she may be dehydrated. She denies LOC or head injury. Patient has a history of hypertension but is non-compliant with her medications, prescribed by Dr. Criss Rosales. She denies nausea, vomiting, extremity numbness, weakness, speech difficulties, vision loss, hearing loss, or fever. She denies seeing a PCP at this time.    Past Medical History  Diagnosis Date  . Bronchitis   . Hypertension   . Acid reflux   . Pregnancy induced hypertension   . Urinary tract  infection   . History of PID   . Abnormal Pap smear     colposcopy, mild dysplasia, HPV  . Gonorrhea   . Chlamydia   . Trichomonas   . Depression     on meds for bipolar  . Anxiety     on meds- stable  . Obesity   . Sleep apnea     hasnt uses CPAP x 4 years  . Fibroid    Past Surgical History  Procedure Laterality Date  . Wisdom tooth extraction    . Foot surgery    . Tubal ligation    . Cesarean section  05/02/03,05/04/08,10/23/10    x3  . Cholecystectomy N/A 10/07/2012    Procedure: LAPAROSCOPIC CHOLECYSTECTOMY WITH INTRAOPERATIVE CHOLANGIOGRAM;  Surgeon: Imogene Burn. Georgette Dover, MD;  Location: [facility];  Service: General;  Laterality: N/A;  . Umbilical hernia repair N/A 12/02/2012    Procedure: LAPAROSCOPIC UMBILICAL HERNIA;  Surgeon: Adin Hector, MD;  Location: [facility];  Service: General;  Laterality: N/A;  . Insertion of mesh N/A 12/02/2012    Procedure: INSERTION OF MESH;  Surgeon: Adin Hector, MD;  Location: [facility];  Service: General;  Laterality: N/A;   Family History  Problem Relation Age of Onset  . Other Neg Hx   . Hypertension Mother   . Heart disease Father   . Stroke Father    History  Substance Use Topics  . Smoking status: Former Smoker -- 0.25 packs/day for 1 years    Types: Cigarettes  .  Smokeless tobacco: Never Used  . Alcohol Use: No     Comment: socially   OB History    Gravida Para Term Preterm AB TAB SAB Ectopic Multiple Living   3 3 3       3       Review of Systems  Constitutional: Negative for fever.  HENT: Negative for hearing loss.   Eyes: Positive for photophobia. Negative for blurred vision and visual disturbance.  Gastrointestinal: Negative for nausea and vomiting.  Neurological: Positive for numbness and headaches. Negative for speech difficulty and weakness.  All other systems reviewed and are negative.   Allergies  Review of patient's allergies indicates no known allergies.  Home Medications   Prior to Admission medications    Medication Sig Start Date End Date Taking? Authorizing Provider  ibuprofen (ADVIL,MOTRIN) 200 MG tablet Take 400 mg by mouth every 6 (six) hours as needed.    Yes Historical Provider, MD  citalopram (CELEXA) 20 MG tablet Take 1 tablet (20 mg total) by mouth daily. Patient not taking: Reported on 06/18/2014 01/26/14   West Pugh, NP  doxycycline (VIBRAMYCIN) 100 MG capsule Take 1 capsule (100 mg total) by mouth 2 (two) times daily. Patient not taking: Reported on 06/18/2014 01/26/14   West Pugh, NP  hydrochlorothiazide (HYDRODIURIL) 25 MG tablet Take 1 tablet (25 mg total) by mouth daily. Patient not taking: Reported on 11/25/2014 01/26/14   West Pugh, NP  ibuprofen (ADVIL,MOTRIN) 800 MG tablet Take 1 tablet (800 mg total) by mouth 3 (three) times daily. Patient not taking: Reported on 06/18/2014 01/26/14   West Pugh, NP  nitrofurantoin, macrocrystal-monohydrate, (MACROBID) 100 MG capsule Take 1 capsule (100 mg total) by mouth 2 (two) times daily. Patient not taking: Reported on 06/18/2014 01/26/14   West Pugh, NP   BP 141/86 mmHg  Pulse 67  Temp(Src) 98 F (36.7 C) (Oral)  Resp 18  SpO2 100%   Physical Exam  Constitutional: She is oriented to person, place, and time. She appears well-developed and well-nourished. No distress.  Nontoxic/nonseptic appearing.  HENT:  Head: Normocephalic and atraumatic.  Mouth/Throat: Oropharynx is clear and moist. No oropharyngeal exudate.  Eyes: Conjunctivae and EOM are normal. Pupils are equal, round, and reactive to light. No scleral icterus.  Neck: Normal range of motion.  No nuchal rigidity or meningismus  Cardiovascular: Normal rate, regular rhythm and intact distal pulses.   Pulmonary/Chest: Effort normal. No respiratory distress.  Respirations even and unlabored  Musculoskeletal: Normal range of motion.  Neurological: She is alert and oriented to person, place, and time. She has normal reflexes. No cranial nerve  deficit. She exhibits normal muscle tone. Coordination normal.  GCS 15. Speech is goal oriented. No cranial nerve deficits appreciated; symmetric eyebrow raise, no facial drooping, tongue midline. Patient has equal grip strength bilaterally with 5/5 strength against resistance in all major muscle groups bilaterally. Sensation to light touch intact. Patient ambulatory with steady gait.  Skin: Skin is warm and dry. No rash noted. She is not diaphoretic. No erythema. No pallor.  Psychiatric: She has a normal mood and affect. Her behavior is normal.  Nursing note and vitals reviewed.   ED Course  Procedures (including critical care time)  DIAGNOSTIC STUDIES: Oxygen Saturation is 99% on RA, normal by my interpretation.    COORDINATION OF CARE: 10:30 PM - Discussed treatment plan with pt at bedside, and pt agreed to plan.   Labs Review Labs Reviewed - No data to display  Imaging Review No results found.   EKG Interpretation None      MDM   Final diagnoses:  Acute nonintractable headache, unspecified headache type    29 year old female presents to the emergency department for further evaluation of a headache. Patient is afebrile. No nuchal rigidity or meningismus to suggest meningitis. No history of head injury or trauma. Neurologic exam nonfocal. Symptoms resolved with IV fluids, Toradol, and Reglan. Patient states that she is feeling much better and wishes to go home. Doubt emergent cause of headache today. 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 She is stable for discharge at this time with instruction to follow-up with her primary care provider for further evaluation of her symptoms, should they persist. Return precautions discussed and provided. Patient agreeable to plan with known address concerns. Patient discharged in good condition.  I personally performed the services described in this  documentation, which was scribed in my presence. The recorded information has been reviewed and is accurate.   Filed Vitals:   11/25/14 2219 11/25/14 2232 11/26/14 0043  BP:  141/86 133/67  Pulse:  67 73  Temp:  98 F (36.7 C) 97.4 F (36.3 C)  TempSrc:  Oral Oral  Resp:  18 14  SpO2: 99% 100% 100%     Antonietta Breach, PA-C 11/26/14 0045  Dorie Rank, MD 11/27/14 1230

## 2014-11-26 NOTE — Discharge Instructions (Signed)
General Headache Without Cause A general headache is pain or discomfort felt around the head or neck area. The cause may not be found.  HOME CARE   Keep all doctor visits.  Only take medicines as told by your doctor.  Lie down in a dark, quiet room when you have a headache.  Keep a journal to find out if certain things bring on headaches. For example, write down:  What you eat and drink.  How much sleep you get.  Any change to your diet or medicines.  Relax by getting a massage or doing other relaxing activities.  Put ice or heat packs on the head and neck area as told by your doctor.  Lessen stress.  Sit up straight. Do not tighten (tense) your muscles.  Quit smoking if you smoke.  Lessen how much alcohol you drink.  Lessen how much caffeine you drink, or stop drinking caffeine.  Eat and sleep on a regular schedule.  Get 7 to 9 hours of sleep, or as told by your doctor.  Keep lights dim if bright lights bother you or make your headaches worse. GET HELP RIGHT AWAY IF:   Your headache becomes really bad.  You have a fever.  You have a stiff neck.  You have trouble seeing.  Your muscles are weak, or you lose muscle control.  You lose your balance or have trouble walking.  You feel like you will pass out (faint), or you pass out.  You have really bad symptoms that are different than your first symptoms.  You have problems with the medicines given to you by your doctor.  Your medicines do not work.  Your headache feels different than the other headaches.  You feel sick to your stomach (nauseous) or throw up (vomit). MAKE SURE YOU:   Understand these instructions.  Will watch your condition.  Will get help right away if you are not doing well or get worse. Document Released: 03/04/2008 Document Revised: 08/18/2011 Document Reviewed: 05/16/2011 [facility] Patient Information 2015 [place], [place]. This information is not intended to replace advice given to  you by your health care provider. Make sure you discuss any questions you have with your health care provider.  Dehydration, Adult Dehydration means your body does not have as much fluid as it needs. Your kidneys, brain, and heart will not work properly without the right amount of fluids and salt.  HOME CARE  Ask your doctor how to replace body fluid losses (rehydrate).  Drink enough fluids to keep your pee (urine) clear or pale yellow.  Drink small amounts of fluids often if you feel sick to your stomach (nauseous) or throw up (vomit).  Eat like you normally do.  Avoid:  Foods or drinks high in sugar.  Bubbly (carbonated) drinks.  Juice.  Very hot or cold fluids.  Drinks with caffeine.  Fatty, greasy foods.  Alcohol.  Tobacco.  Eating too much.  Gelatin desserts.  Wash your hands to avoid spreading germs (bacteria, viruses).  Only take medicine as told by your doctor.  Keep all doctor visits as told. GET HELP RIGHT AWAY IF:   You cannot drink something without throwing up.  You get worse even with treatment.  Your vomit has blood in it or looks greenish.  Your poop (stool) has blood in it or looks black and tarry.  You have not peed in 6 to 8 hours.  You pee a small amount of very dark pee.  You have a fever.  You pass out (faint).  You have belly (abdominal) pain that gets worse or stays in one spot (localizes).  You have a rash, stiff neck, or bad headache.  You get easily annoyed, sleepy, or are hard to wake up.  You feel weak, dizzy, or very thirsty. MAKE SURE YOU:   Understand these instructions.  Will watch your condition.  Will get help right away if you are not doing well or get worse. Document Released: 03/22/2009 Document Revised: 08/18/2011 Document Reviewed: 01/13/2011 [facility] Patient Information 2015 [place], [place]. This information is not intended to replace advice given to you by your health care provider. Make sure you  discuss any questions you have with your health care provider.

## 2015-01-11 ENCOUNTER — Inpatient Hospital Stay (HOSPITAL_COMMUNITY)
Admission: AD | Admit: 2015-01-11 | Discharge: 2015-01-11 | Disposition: A | Discharge status: Home / Self Care | Payer: Medicaid Other | Source: Ambulatory Visit | Attending: Family Medicine | Admitting: Family Medicine

## 2015-01-11 ENCOUNTER — Encounter (HOSPITAL_COMMUNITY): Payer: Self-pay | Admitting: *Deleted

## 2015-01-11 DIAGNOSIS — R109 Unspecified abdominal pain: Secondary | ICD-10-CM | POA: Diagnosis present

## 2015-01-11 DIAGNOSIS — D259 Leiomyoma of uterus, unspecified: Secondary | ICD-10-CM | POA: Diagnosis not present

## 2015-01-11 DIAGNOSIS — Z87891 Personal history of nicotine dependence: Secondary | ICD-10-CM | POA: Diagnosis not present

## 2015-01-11 DIAGNOSIS — N920 Excessive and frequent menstruation with regular cycle: Secondary | ICD-10-CM | POA: Diagnosis not present

## 2015-01-11 DIAGNOSIS — N760 Acute vaginitis: Secondary | ICD-10-CM | POA: Insufficient documentation

## 2015-01-11 DIAGNOSIS — B9689 Other specified bacterial agents as the cause of diseases classified elsewhere: Secondary | ICD-10-CM

## 2015-01-11 DIAGNOSIS — N939 Abnormal uterine and vaginal bleeding, unspecified: Secondary | ICD-10-CM | POA: Insufficient documentation

## 2015-01-11 LAB — URINALYSIS, ROUTINE W REFLEX MICROSCOPIC
Glucose, UA: NEGATIVE mg/dL
Ketones, ur: 15 mg/dL — AB
Leukocytes, UA: NEGATIVE
Nitrite: NEGATIVE
Protein, ur: 30 mg/dL — AB
Specific Gravity, Urine: 1.025 (ref 1.005–1.030)
Urobilinogen, UA: 4 mg/dL — ABNORMAL HIGH (ref 0.0–1.0)
pH: 6 (ref 5.0–8.0)

## 2015-01-11 LAB — URINE MICROSCOPIC-ADD ON

## 2015-01-11 LAB — WET PREP, GENITAL
Trich, Wet Prep: NONE SEEN
Yeast Wet Prep HPF POC: NONE SEEN

## 2015-01-11 LAB — CBC WITH DIFFERENTIAL/PLATELET
Basophils Absolute: 0 10*3/uL (ref 0.0–0.1)
Basophils Relative: 0 % (ref 0–1)
Eosinophils Absolute: 0.1 10*3/uL (ref 0.0–0.7)
Eosinophils Relative: 1 % (ref 0–5)
HCT: 34.9 % — ABNORMAL LOW (ref 36.0–46.0)
Hemoglobin: 12 g/dL (ref 12.0–15.0)
Lymphocytes Relative: 31 % (ref 12–46)
Lymphs Abs: 3.2 10*3/uL (ref 0.7–4.0)
MCH: 28.4 pg (ref 26.0–34.0)
MCHC: 34.4 g/dL (ref 30.0–36.0)
MCV: 82.5 fL (ref 78.0–100.0)
Monocytes Absolute: 0.6 10*3/uL (ref 0.1–1.0)
Monocytes Relative: 5 % (ref 3–12)
Neutro Abs: 6.7 10*3/uL (ref 1.7–7.7)
Neutrophils Relative %: 63 % (ref 43–77)
Platelets: 319 10*3/uL (ref 150–400)
RBC: 4.23 MIL/uL (ref 3.87–5.11)
RDW: 13.8 % (ref 11.5–15.5)
WBC: 10.6 10*3/uL — ABNORMAL HIGH (ref 4.0–10.5)

## 2015-01-11 LAB — POCT PREGNANCY, URINE: Preg Test, Ur: NEGATIVE

## 2015-01-11 MED ORDER — METRONIDAZOLE 500 MG PO TABS: 500.0000 mg | ORAL_TABLET | Freq: Two times a day (BID) | ORAL | Status: DC

## 2015-01-11 NOTE — MAU Provider Note (Signed)
History     CSN: [id number]  Arrival date and time: 01/11/15 8264   First Provider Initiated Contact with Patient 01/11/15 1848      Chief Complaint  Patient presents with  . Vaginal Bleeding  . Abdominal Pain   HPI  [name] is a 29 y.o. [id number] who presents to MAU today with complaint of vaginal bleeding and abdominal cramping. The patient states that during her last visit in MAU she as diagnosed with a fibroid. She states that this causes irregular painful, heavy periods. She states that her bleeding started today. She has used 1 pad all day. She was having cramping earlier, but took 2 ibuprofen with relief and denies pain now. She also denies associated weakness or dizziness. Patient also endorses a small amount of white discharge and requests infection testing.   OB History    Gravida Para Term Preterm AB TAB SAB Ectopic Multiple Living   3 3 3       3       Past Medical History  Diagnosis Date  . Bronchitis   . Hypertension   . Acid reflux   . Pregnancy induced hypertension   . Urinary tract infection   . History of PID   . Abnormal Pap smear     colposcopy, mild dysplasia, HPV  . Gonorrhea   . Chlamydia   . Trichomonas   . Depression     on meds for bipolar  . Anxiety     on meds- stable  . Obesity   . Sleep apnea     hasnt uses CPAP x 4 years  . Fibroid     Past Surgical History  Procedure Laterality Date  . Wisdom tooth extraction    . Foot surgery    . Tubal ligation    . Cesarean section  05/02/03,05/04/08,10/23/10    x3  . Cholecystectomy N/A 10/07/2012    Procedure: LAPAROSCOPIC CHOLECYSTECTOMY WITH INTRAOPERATIVE CHOLANGIOGRAM;  Surgeon: Imogene Burn. Georgette Dover, MD;  Location: [facility];  Service: General;  Laterality: N/A;  . Umbilical hernia repair N/A 12/02/2012    Procedure: LAPAROSCOPIC UMBILICAL HERNIA;  Surgeon: Adin Hector, MD;  Location: [facility];  Service: General;  Laterality: N/A;  . Insertion of mesh N/A 12/02/2012    Procedure:  INSERTION OF MESH;  Surgeon: Adin Hector, MD;  Location: [facility];  Service: General;  Laterality: N/A;    Family History  Problem Relation Age of Onset  . Other Neg Hx   . Hypertension Mother   . Heart disease Father   . Stroke Father     History  Substance Use Topics  . Smoking status: Former Smoker -- 0.25 packs/day for 1 years    Types: Cigarettes  . Smokeless tobacco: Never Used  . Alcohol Use: No     Comment: socially    Allergies: No Known Allergies  Prescriptions prior to admission  Medication Sig Dispense Refill Last Dose  . citalopram (CELEXA) 20 MG tablet Take 1 tablet (20 mg total) by mouth daily. (Patient not taking: Reported on 06/18/2014) 30 tablet 1 Completed Course at Unknown time  . doxycycline (VIBRAMYCIN) 100 MG capsule Take 1 capsule (100 mg total) by mouth 2 (two) times daily. (Patient not taking: Reported on 06/18/2014) 20 capsule 0 Completed Course at Unknown time  . hydrochlorothiazide (HYDRODIURIL) 25 MG tablet Take 1 tablet (25 mg total) by mouth daily. (Patient not taking: Reported on 11/25/2014) 30 tablet 1 Not Taking at Unknown time  .  ibuprofen (ADVIL,MOTRIN) 200 MG tablet Take 400 mg by mouth every 6 (six) hours as needed.    11/25/2014 at Unknown time  . ibuprofen (ADVIL,MOTRIN) 800 MG tablet Take 1 tablet (800 mg total) by mouth 3 (three) times daily. (Patient not taking: Reported on 06/18/2014) 21 tablet 0 Completed Course at Unknown time  . nitrofurantoin, macrocrystal-monohydrate, (MACROBID) 100 MG capsule Take 1 capsule (100 mg total) by mouth 2 (two) times daily. (Patient not taking: Reported on 06/18/2014) 10 capsule 0 Completed Course at Unknown time    Review of Systems  Constitutional: Negative for fever and malaise/fatigue.  Gastrointestinal: Negative for nausea, vomiting, abdominal pain, diarrhea and constipation.  Genitourinary: Negative for dysuria, urgency and frequency.       + vaginal bleeding, vaginal discharge   Physical Exam    Blood pressure 142/95, pulse 81, temperature 98.4 F (36.9 C), temperature source Oral, resp. rate 16, height 5\' 6"  (1.676 m), weight 267 lb (121.11 kg), last menstrual period 12/24/2014.  Physical Exam  Nursing note and vitals reviewed. Constitutional: She is oriented to person, place, and time. She appears well-developed and well-nourished. No distress.  HENT:  Head: Normocephalic and atraumatic.  Cardiovascular: Normal rate.   Respiratory: Effort normal.  GI: Soft. She exhibits no distension and no mass. There is no tenderness. There is no rebound and no guarding.  Genitourinary: Uterus is enlarged (slightly) and tender (mild). Cervix exhibits no motion tenderness, no discharge and no friability. Right adnexum displays no mass and no tenderness. Left adnexum displays no mass and no tenderness. There is bleeding (scant blood noted) in the vagina. No vaginal discharge found.  Neurological: She is alert and oriented to person, place, and time.  Skin: Skin is warm and dry. No erythema.  Psychiatric: She has a normal mood and affect.   Results for orders placed or performed during the hospital encounter of 01/11/15 (from the past 24 hour(s))  Urinalysis, Routine w reflex microscopic (not at [facility])     Status: Abnormal   Collection Time: 01/11/15  6:27 PM  Result Value Ref Range   Color, Urine AMBER (A) YELLOW   APPearance CLEAR CLEAR   Specific Gravity, Urine 1.025 1.005 - 1.030   pH 6.0 5.0 - 8.0   Glucose, UA NEGATIVE NEGATIVE mg/dL   Hgb urine dipstick LARGE (A) NEGATIVE   Bilirubin Urine SMALL (A) NEGATIVE   Ketones, ur 15 (A) NEGATIVE mg/dL   Protein, ur 30 (A) NEGATIVE mg/dL   Urobilinogen, UA 4.0 (H) 0.0 - 1.0 mg/dL   Nitrite NEGATIVE NEGATIVE   Leukocytes, UA NEGATIVE NEGATIVE  Urine microscopic-add on     Status: Abnormal   Collection Time: 01/11/15  6:27 PM  Result Value Ref Range   Squamous Epithelial / LPF MANY (A) RARE   WBC, UA 3-6 <3 WBC/hpf   RBC / HPF 11-20 <3  RBC/hpf   Bacteria, UA MANY (A) RARE   Urine-Other MUCOUS PRESENT   Pregnancy, urine POC     Status: None   Collection Time: 01/11/15  6:41 PM  Result Value Ref Range   Preg Test, Ur NEGATIVE NEGATIVE  Wet prep, genital     Status: Abnormal   Collection Time: 01/11/15  6:50 PM  Result Value Ref Range   Yeast Wet Prep HPF POC NONE SEEN NONE SEEN   Trich, Wet Prep NONE SEEN NONE SEEN   Clue Cells Wet Prep HPF POC FEW (A) NONE SEEN   WBC, Wet Prep HPF POC FEW (A) NONE  SEEN  CBC with Differential/Platelet     Status: Abnormal (Preliminary result)   Collection Time: 01/11/15  7:00 PM  Result Value Ref Range   WBC 10.6 (H) 4.0 - 10.5 K/uL   RBC 4.23 3.87 - 5.11 MIL/uL   Hemoglobin 12.0 12.0 - 15.0 g/dL   HCT 34.9 (L) 36.0 - 46.0 %   MCV 82.5 78.0 - 100.0 fL   MCH 28.4 26.0 - 34.0 pg   MCHC 34.4 30.0 - 36.0 g/dL   RDW 13.8 11.5 - 15.5 %   Platelets 319 150 - 400 K/uL   Neutrophils Relative % 63 43 - 77 %   Neutro Abs 6.7 1.7 - 7.7 K/uL   Lymphocytes Relative 31 12 - 46 %   Lymphs Abs 3.2 0.7 - 4.0 K/uL   Monocytes Relative 5 3 - 12 %   Monocytes Absolute 0.6 0.1 - 1.0 K/uL   Eosinophils Relative 1 0 - 5 %   Eosinophils Absolute 0.1 0.0 - 0.7 K/uL   Basophils Relative 0 0 - 1 %   Basophils Absolute 0.0 0.0 - 0.1 K/uL   Other PENDING %    MAU Course  Procedures None  MDM UPT - negative UA, CBC, wet prep, GC/Chlamydia, HIV and RPR today Patient is hemodynamically stable and bleeding is minimal on exam.  Assessment and Plan  A: Fibroid, uterine Abnormal vaginal bleeding Bacterial vaginosis  P: Discharge home Rx for Flagyl given to patient Bleeding precautions discussed Patient referred to [facility] for further management. They will call patient with an appointment.  Patient may return to MAU as needed or if her condition were to change or worsen   Luvenia Redden, PA-C  01/11/2015, 7:14 PM

## 2015-01-11 NOTE — Discharge Instructions (Signed)
Abnormal Uterine Bleeding Abnormal uterine bleeding means bleeding from the vagina that is not your normal menstrual period. This can be:  Bleeding or spotting between periods.  Bleeding after sex (sexual intercourse).  Bleeding that is heavier or more than normal.  Periods that last longer than usual.  Bleeding after menopause. There are many problems that may cause this. Treatment will depend on the cause of the bleeding. Any kind of bleeding that is not normal should be reviewed by your doctor.  HOME CARE Watch your condition for any changes. These actions may lessen any discomfort you are having:  Do not use tampons or douches as told by your doctor.  Change your pads often. You should get regular pelvic exams and Pap tests. Keep all appointments for tests as told by your doctor. GET HELP IF:  You are bleeding for more than 1 week.  You feel dizzy at times. GET HELP RIGHT AWAY IF:   You pass out.  You have to change pads every 15 to 30 minutes.  You have belly pain.  You have a fever.  You become sweaty or weak.  You are passing large blood clots from the vagina.  You feel sick to your stomach (nauseous) and throw up (vomit). MAKE SURE YOU:  Understand these instructions.  Will watch your condition.  Will get help right away if you are not doing well or get worse. Document Released: 03/23/2009 Document Revised: 05/31/2013 Document Reviewed: 12/23/2012 [facility] Patient Information 2015 [place], [place]. This information is not intended to replace advice given to you by your health care provider. Make sure you discuss any questions you have with your health care provider. Bacterial Vaginosis Bacterial vaginosis is an infection of the vagina. It happens when too many of certain germs (bacteria) grow in the vagina. HOME CARE  Take your medicine as told by your doctor.  Finish your medicine even if you start to feel better.  Do not have sex until you finish your  medicine and are better.  Tell your sex partner that you have an infection. They should see their doctor for treatment.  Practice safe sex. Use condoms. Have only one sex partner. GET HELP IF:  You are not getting better after 3 days of treatment.  You have more grey fluid (discharge) coming from your vagina than before.  You have more pain than before.  You have a fever. MAKE SURE YOU:   Understand these instructions.  Will watch your condition.  Will get help right away if you are not doing well or get worse. Document Released: 03/04/2008 Document Revised: 03/16/2013 Document Reviewed: 01/05/2013 [facility] Patient Information 2015 [place], [place]. This information is not intended to replace advice given to you by your health care provider. Make sure you discuss any questions you have with your health care provider. Fibroids Fibroids are lumps (tumors) that can occur any place in a woman's body. These lumps are not cancerous. Fibroids vary in size, weight, and where they grow. HOME CARE  Do not take aspirin.  Write down the number of pads or tampons you use during your period. Tell your doctor. This can help determine the best treatment for you. GET HELP RIGHT AWAY IF:  You have pain in your lower belly (abdomen) that is not helped with medicine.  You have cramps that are not helped with medicine.  You have more bleeding between or during your period.  You feel lightheaded or pass out (faint).  Your lower belly pain gets worse.  MAKE SURE YOU:  Understand these instructions.  Will watch your condition.  Will get help right away if you are not doing well or get worse. Document Released: 06/28/2010 Document Revised: 08/18/2011 Document Reviewed: 06/28/2010 [facility] Patient Information 2015 [place], [place]. This information is not intended to replace advice given to you by your health care provider. Make sure you discuss any questions you have with your health care  provider.

## 2015-01-11 NOTE — MAU Note (Signed)
Pt presents complaining of vaginal bleeding from a known fibroid. Also has cramping. States she feels like she's feeling movement but she has had a tubal. Also having hot flashes.

## 2015-01-12 ENCOUNTER — Emergency Department (HOSPITAL_COMMUNITY)
Admission: EM | Admit: 2015-01-12 | Discharge: 2015-01-12 | Disposition: A | Discharge status: Home / Self Care | Payer: Medicaid Other | Attending: Emergency Medicine | Admitting: Emergency Medicine

## 2015-01-12 ENCOUNTER — Encounter (HOSPITAL_COMMUNITY): Payer: Self-pay

## 2015-01-12 DIAGNOSIS — I10 Essential (primary) hypertension: Secondary | ICD-10-CM | POA: Insufficient documentation

## 2015-01-12 DIAGNOSIS — Z8742 Personal history of other diseases of the female genital tract: Secondary | ICD-10-CM | POA: Diagnosis not present

## 2015-01-12 DIAGNOSIS — R51 Headache: Secondary | ICD-10-CM | POA: Diagnosis present

## 2015-01-12 DIAGNOSIS — E669 Obesity, unspecified: Secondary | ICD-10-CM | POA: Diagnosis not present

## 2015-01-12 DIAGNOSIS — K219 Gastro-esophageal reflux disease without esophagitis: Secondary | ICD-10-CM | POA: Insufficient documentation

## 2015-01-12 DIAGNOSIS — Z87891 Personal history of nicotine dependence: Secondary | ICD-10-CM | POA: Insufficient documentation

## 2015-01-12 DIAGNOSIS — Z79899 Other long term (current) drug therapy: Secondary | ICD-10-CM | POA: Diagnosis not present

## 2015-01-12 DIAGNOSIS — Z8619 Personal history of other infectious and parasitic diseases: Secondary | ICD-10-CM | POA: Insufficient documentation

## 2015-01-12 DIAGNOSIS — Z8544 Personal history of malignant neoplasm of other female genital organs: Secondary | ICD-10-CM | POA: Insufficient documentation

## 2015-01-12 DIAGNOSIS — E860 Dehydration: Secondary | ICD-10-CM

## 2015-01-12 DIAGNOSIS — F419 Anxiety disorder, unspecified: Secondary | ICD-10-CM | POA: Insufficient documentation

## 2015-01-12 DIAGNOSIS — F329 Major depressive disorder, single episode, unspecified: Secondary | ICD-10-CM | POA: Insufficient documentation

## 2015-01-12 DIAGNOSIS — R519 Headache, unspecified: Secondary | ICD-10-CM

## 2015-01-12 LAB — GC/CHLAMYDIA PROBE AMP (~~LOC~~) NOT AT ARMC
Chlamydia: NEGATIVE
Neisseria Gonorrhea: NEGATIVE

## 2015-01-12 LAB — I-STAT BETA HCG BLOOD, ED (MC, WL, AP ONLY): I-stat hCG, quantitative: <5 m[IU]/mL (ref <5)

## 2015-01-12 LAB — I-STAT CHEM 8, ED
BUN: 3 mg/dL — ABNORMAL LOW (ref 6–20)
Calcium, Ion: 1.21 mmol/L (ref 1.12–1.23)
Chloride: 104 mmol/L (ref 101–111)
Creatinine, Ser: 0.7 mg/dL (ref 0.44–1.00)
Glucose, Bld: 96 mg/dL (ref 65–99)
HCT: 40 % (ref 36.0–46.0)
Hemoglobin: 13.6 g/dL (ref 12.0–15.0)
Potassium: 3 mmol/L — ABNORMAL LOW (ref 3.5–5.1)
Sodium: 142 mmol/L (ref 135–145)
TCO2: 24 mmol/L (ref 0–100)

## 2015-01-12 LAB — HIV ANTIBODY (ROUTINE TESTING W REFLEX): HIV Screen 4th Generation wRfx: NONREACTIVE

## 2015-01-12 LAB — RPR: RPR Ser Ql: NONREACTIVE

## 2015-01-12 MED ORDER — KETOROLAC TROMETHAMINE 30 MG/ML IJ SOLN
30.0000 mg | Freq: Once | INTRAMUSCULAR | Status: AC
  Administered 2015-01-12: 30 mg via INTRAVENOUS
  Filled 2015-01-12: qty 1

## 2015-01-12 MED ORDER — SODIUM CHLORIDE 0.9 % IV BOLUS (SEPSIS)
1500.0000 mL | Freq: Once | INTRAVENOUS | Status: AC
Start: 2015-01-12 — End: 2015-01-12
  Administered 2015-01-12: 1500 mL via INTRAVENOUS

## 2015-01-12 NOTE — ED Notes (Signed)
MD at bedside. 

## 2015-01-12 NOTE — ED Notes (Signed)
Bed: YO45 Expected date:  Expected time:  Means of arrival:  Comments: EMS- 29yo F, headache x 1 day

## 2015-01-12 NOTE — ED Notes (Signed)
Per [facility]- Pt c/o of right sided headache that radiates to the back. Denies any other complaints. Pt states she has HTN. She is not taking medications. She states she has not been eating or drinking for several days. Denies N/V/D and fever

## 2015-01-12 NOTE — ED Provider Notes (Signed)
CSN: [id number]     Arrival date & time 01/12/15  0808 History   First MD Initiated Contact with Patient 01/12/15 [phone]     Chief Complaint  Patient presents with  . Headache    rt side x 1 day  . Hypertension    noncompliant with medications      HPI Patient reports mild intermittent headache over the past 48 hours.  She states the pain moves around her head.  She reports decreased oral intake over the past several days.  She denies nausea vomiting diarrhea.  No fever or chills.  No neck pain or stiffness.  Denies dysuria or urinary frequency.  She reports she's currently on her menstrual cycle.  Denies abdominal pain.  Denies weakness of her arms or legs.  No change in her vision.  Symptoms are mild in severity.   Past Medical History  Diagnosis Date  . Bronchitis   . Hypertension   . Acid reflux   . Pregnancy induced hypertension   . Urinary tract infection   . History of PID   . Abnormal Pap smear     colposcopy, mild dysplasia, HPV  . Gonorrhea   . Chlamydia   . Trichomonas   . Depression     on meds for bipolar  . Anxiety     on meds- stable  . Obesity   . Sleep apnea     hasnt uses CPAP x 4 years  . Fibroid    Past Surgical History  Procedure Laterality Date  . Wisdom tooth extraction    . Foot surgery    . Tubal ligation    . Cesarean section  05/02/03,05/04/08,10/23/10    x3  . Cholecystectomy N/A 10/07/2012    Procedure: LAPAROSCOPIC CHOLECYSTECTOMY WITH INTRAOPERATIVE CHOLANGIOGRAM;  Surgeon: Imogene Burn. Georgette Dover, MD;  Location: [facility];  Service: General;  Laterality: N/A;  . Umbilical hernia repair N/A 12/02/2012    Procedure: LAPAROSCOPIC UMBILICAL HERNIA;  Surgeon: Adin Hector, MD;  Location: [facility];  Service: General;  Laterality: N/A;  . Insertion of mesh N/A 12/02/2012    Procedure: INSERTION OF MESH;  Surgeon: Adin Hector, MD;  Location: [facility];  Service: General;  Laterality: N/A;   Family History  Problem Relation Age of Onset  . Other Neg Hx    . Hypertension Mother   . Heart disease Father   . Stroke Father    History  Substance Use Topics  . Smoking status: Former Smoker -- 0.25 packs/day for 1 years    Types: Cigarettes  . Smokeless tobacco: Never Used  . Alcohol Use: No     Comment: socially   OB History    Gravida Para Term Preterm AB TAB SAB Ectopic Multiple Living   3 3 3       3      Review of Systems  All other systems reviewed and are negative.     Allergies  Review of patient's allergies indicates no known allergies.  Home Medications   Prior to Admission medications   Medication Sig Start Date End Date Taking? Authorizing Provider  ibuprofen (ADVIL,MOTRIN) 200 MG tablet Take 400 mg by mouth every 6 (six) hours as needed.    Yes Historical Provider, MD  metroNIDAZOLE (FLAGYL) 500 MG tablet Take 1 tablet (500 mg total) by mouth 2 (two) times daily. 01/11/15  Yes Luvenia Redden, PA-C  hydrochlorothiazide (HYDRODIURIL) 25 MG tablet Take 1 tablet (25 mg total) by mouth daily. Patient not taking:  Reported on 11/25/2014 01/26/14   West Pugh, NP  ibuprofen (ADVIL,MOTRIN) 800 MG tablet Take 1 tablet (800 mg total) by mouth 3 (three) times daily. Patient not taking: Reported on 06/18/2014 01/26/14   West Pugh, NP   BP 147/85 mmHg  Pulse 69  Temp(Src) 98.8 F (37.1 C) (Oral)  Resp 18  Ht 5\' 6"  (1.676 m)  Wt 267 lb (121.11 kg)  BMI 43.12 kg/m2  SpO2 100%  LMP 12/24/2014 Physical Exam  Constitutional: She is oriented to person, place, and time. She appears well-developed and well-nourished. No distress.  HENT:  Head: Normocephalic and atraumatic.  Eyes: EOM are normal.  Neck: Normal range of motion.  Cardiovascular: Normal rate, regular rhythm and normal heart sounds.   Pulmonary/Chest: Effort normal and breath sounds normal.  Abdominal: Soft. She exhibits no distension. There is no tenderness.  Musculoskeletal: Normal range of motion.  Neurological: She is alert and oriented to person,  place, and time.  Skin: Skin is warm and dry.  Psychiatric: She has a normal mood and affect. Judgment normal.  Nursing note and vitals reviewed.   ED Course  Procedures (including critical care time) Labs Review Labs Reviewed  I-STAT CHEM 8, ED - Abnormal; Notable for the following:    Potassium 3.0 (*)    BUN 3 (*)    All other components within normal limits  I-STAT BETA HCG BLOOD, ED ([facility], [facility], [facility] ONLY)    Imaging Review No results found.   EKG Interpretation None        MDM   Final diagnoses:  Headache, unspecified headache type  Dehydration    Patient feels much better after IV fluids.  Suspect dehydration/volume depletion.  Discharge home in good condition.  Vital signs normal.  Primary care follow-up.     Jola Schmidt, MD 01/12/15 [phone]

## 2015-01-17 ENCOUNTER — Telehealth: Payer: Self-pay | Admitting: General Practice

## 2015-01-17 NOTE — Telephone Encounter (Signed)
Patient called and left message stating she is having awful migraines and hasn't really been eating. Patient states she started BP medication last week and also really hasn't had an appetite. Per chart review patient is not a current pt here but has an appt on 8/17. Called patient and discussed that she needs to be speaking with her PCP about her migraines and BP. Patient verbalized understanding and states that she was being seen at the [facility] but they released here and she has been trying to get in somewhere else. Told patient if she has their name removed off her medicaid card we could help refer her at her visit but otherwise will not be able to. Patient verbalized understanding and states she knows she will contact her case worker today. Told patient she may take tylenol or ibuprofen for her headache but to continue taking the BP medication. Patient verbalized understanding and had no questions

## 2015-01-24 ENCOUNTER — Ambulatory Visit: Payer: Medicaid Other | Admitting: Obstetrics & Gynecology

## 2015-03-02 ENCOUNTER — Emergency Department
Admission: EM | Admit: 2015-03-02 | Discharge: 2015-03-02 | Discharge status: Against Medical Advice | Payer: Medicaid Other | Attending: Emergency Medicine | Admitting: Emergency Medicine

## 2015-03-02 ENCOUNTER — Encounter: Payer: Self-pay | Admitting: Emergency Medicine

## 2015-03-02 DIAGNOSIS — F410 Panic disorder [episodic paroxysmal anxiety] without agoraphobia: Secondary | ICD-10-CM | POA: Diagnosis present

## 2015-03-02 DIAGNOSIS — I10 Essential (primary) hypertension: Secondary | ICD-10-CM | POA: Insufficient documentation

## 2015-03-02 DIAGNOSIS — Z87891 Personal history of nicotine dependence: Secondary | ICD-10-CM | POA: Insufficient documentation

## 2015-03-02 HISTORY — DX: Panic disorder (episodic paroxysmal anxiety): F41.0

## 2015-03-02 NOTE — ED Notes (Addendum)
Pt presents with panic attack; onset about 30 min ago. Hx of the same. Denies any new stressors or triggers to her anxiety. Pt tachypneic during triage and appears anxious.  [name]

## 2015-03-05 ENCOUNTER — Encounter (HOSPITAL_COMMUNITY): Payer: Self-pay | Admitting: Emergency Medicine

## 2015-03-05 ENCOUNTER — Emergency Department (HOSPITAL_COMMUNITY)
Admission: EM | Admit: 2015-03-05 | Discharge: 2015-03-05 | Disposition: A | Discharge status: Home / Self Care | Payer: Medicaid Other | Attending: Emergency Medicine | Admitting: Emergency Medicine

## 2015-03-05 DIAGNOSIS — Z86018 Personal history of other benign neoplasm: Secondary | ICD-10-CM | POA: Diagnosis not present

## 2015-03-05 DIAGNOSIS — Z87440 Personal history of urinary (tract) infections: Secondary | ICD-10-CM | POA: Diagnosis not present

## 2015-03-05 DIAGNOSIS — Z79899 Other long term (current) drug therapy: Secondary | ICD-10-CM | POA: Insufficient documentation

## 2015-03-05 DIAGNOSIS — Z87891 Personal history of nicotine dependence: Secondary | ICD-10-CM | POA: Insufficient documentation

## 2015-03-05 DIAGNOSIS — I10 Essential (primary) hypertension: Secondary | ICD-10-CM | POA: Insufficient documentation

## 2015-03-05 DIAGNOSIS — R251 Tremor, unspecified: Secondary | ICD-10-CM | POA: Diagnosis not present

## 2015-03-05 DIAGNOSIS — Z8719 Personal history of other diseases of the digestive system: Secondary | ICD-10-CM | POA: Diagnosis not present

## 2015-03-05 DIAGNOSIS — Z8742 Personal history of other diseases of the female genital tract: Secondary | ICD-10-CM | POA: Insufficient documentation

## 2015-03-05 DIAGNOSIS — Z8669 Personal history of other diseases of the nervous system and sense organs: Secondary | ICD-10-CM | POA: Insufficient documentation

## 2015-03-05 DIAGNOSIS — E669 Obesity, unspecified: Secondary | ICD-10-CM | POA: Diagnosis not present

## 2015-03-05 DIAGNOSIS — F410 Panic disorder [episodic paroxysmal anxiety] without agoraphobia: Secondary | ICD-10-CM | POA: Diagnosis present

## 2015-03-05 DIAGNOSIS — Z8619 Personal history of other infectious and parasitic diseases: Secondary | ICD-10-CM | POA: Diagnosis not present

## 2015-03-05 DIAGNOSIS — Z8709 Personal history of other diseases of the respiratory system: Secondary | ICD-10-CM | POA: Insufficient documentation

## 2015-03-05 NOTE — ED Notes (Signed)
Pt reports panic attack, reports shaking . Also report was upset today. Pt denies chest or shortness of breath. Pt denies Hx anxiety and sts  not taking any meds for panic attack. Pt alert and oriented x4. Pt is in no distress at this time. [name]

## 2015-03-05 NOTE — Discharge Instructions (Signed)
Tonight your blood sugar is normal  I would recommend that you take at least 8 hours of the day for sleep and a regular meal   Emergency Department Resource Guide 1) Find a Doctor and Pay Out of Pocket Although you won't have to find out who is covered by your insurance plan, it is a good idea to ask around and get recommendations. You will then need to call the office and see if the doctor you have chosen will accept you as a new patient and what types of options they offer for patients who are self-pay. Some doctors offer discounts or will set up payment plans for their patients who do not have insurance, but you will need to ask so you aren't surprised when you get to your appointment.  2) Contact Your Local Health Department Not all health departments have doctors that can see patients for sick visits, but many do, so it is worth a call to see if yours does. If you don't know where your local health department is, you can check in your phone book. The CDC also has a tool to help you locate your state's health department, and many state websites also have listings of all of their local health departments.  3) Find a [facility] If your illness is not likely to be very severe or complicated, you may want to try a walk in clinic. These are popping up all over the country in pharmacies, drugstores, and shopping centers. They're usually staffed by nurse practitioners or physician assistants that have been trained to treat common illnesses and complaints. They're usually fairly quick and inexpensive. However, if you have serious medical issues or chronic medical problems, these are probably not your best option.  No Primary Care Doctor: - Call Health Connect at  [phone] - they can help you locate a primary care doctor that  accepts your insurance, provides certain services, etc. - Physician Referral Service- [phone]  Chronic Pain Problems: Organization         Address  Phone    Notes  [facility]  [phone] Patients need to be referred by their primary care doctor.   Medication Assistance: Organization         Address  Phone   Notes  [facility] Medication [facility] - [facility] [address] [place], [place] [address] [phone] --Must be a resident of [facility] -- Must have NO insurance coverage whatsoever (no Medicaid/ Medicare, etc.) -- The pt. MUST have a primary care doctor that directs their care regularly and follows them in the community   MedAssist  [phone]   [employer]  [phone]    Agencies that provide inexpensive medical care: Organization         Address  Phone   Notes  [place]  [phone]   [facility]    [phone]   [facility] [place], [place] [address] [phone]   [place] [address], [place] [phone]   Planned Parenthood    [phone]   [facility]    [phone]   [place] and [address], [place] Phone:  [phone], Fax:  [phone] Hours of Operation:  9 am - 6 pm, M-F.  Also accepts Medicaid/Medicare and self-pay.  [facility] for [place] [address],  [place] Phone: [phone], Fax: [phone]. Hours of Operation:  8:30 am - 5:30 pm, M-F.  Also accepts Medicaid and self-pay.  [facility] [place] [address] [place] Phone: [phone]   [place], [place], [place] [phone], Ext. [phone] Mondays & Thursdays: 7-9 AM.  First 15 patients are seen on a first come, first serve basis.    [place] Providers:  Organization         Address  Phone   Notes  [facility] [address], [place] [phone] Also accepts self-pay patients.  [facility]  [address], [place]  [phone]   [place], [address], [place] [phone]   [facility] [address], [place] [phone]   Lucianne Lei [address], [place]   [phone] Only accepts [place] Access [place] patients after they have their name applied to their card.   Self-Pay (no insurance) in [facility]:  Organization         Address  Phone   Notes  Sickle Cell Patients, [facility] [place] [phone]   [facility] Urgent Care [place] [phone]   [facility] [place]  [address], [place], [place] [phone]   [facility]/Dr. Osei-Bonsu  [address] [place] or [address], [place] [phone] Phone number for both [place] and [place] locations is the same.  Urgent Medical and [facility] [address], [place] [phone]   [facility] [address], [place] or [address] [phone]   [facility] [address] [place] [phone], phone; [phone], fax Sees patients 1st and 3rd Saturday of every month.  Must not qualify for public or private insurance (i.e. Medicaid, Medicare, [place] Health Choice, Veterans' Benefits)  Household income should be no more than 200% of the poverty level The clinic cannot treat you if you are pregnant or think you are pregnant  Sexually transmitted diseases are not treated at the clinic.    Dental Care: Organization         Address  Phone  Notes  [facility] Department of [facility] [phone] Accepts children up to age 65 who are enrolled in [place] or [place]; pregnant women with a Medicaid card; and children who have  applied for Medicaid or [place] Health Choice, but were declined, whose parents can pay a reduced fee at time of service.  [facility] Department of [facility]  [address], [place] [phone] Accepts children up to age 41 who are enrolled in [place] or [place]; pregnant women with a Medicaid card; and children who have applied for Medicaid or [place] Health Choice, but were declined, whose parents can pay a reduced fee at time of service.  [place] Adult [facility] Access PROGRAM  [place] [phone] Patients are seen by appointment only. Walk-ins are not accepted. [place] will see patients 25 years of age and older. Monday - Tuesday (8am-5pm) Most Wednesdays (8:30-5pm) $30 per visit, cash only  [place]  [place]  [address], [facility] [phone] Patients are seen by appointment only. Walk-ins are not accepted. [place] will see patients 73 years of age and older. One Wednesday Evening (Monthly: Volunteer Based).  $30 per visit, cash only  [place]  [phone] for adults; Children under age 15, call Graduate Pediatric Dentistry at [phone]. Children aged 25-14, please call [phone] to request a pediatric application.  Dental services are provided in all areas of dental care including fillings, crowns and bridges, complete and partial dentures, implants, gum treatment, root canals, and extractions. Preventive care is also provided. Treatment is provided to both adults and children. Patients are selected via a lottery and there is often a waiting list.   [facility] [address] [place]  [phone] [email or web address]   [facility] [address] [place], [place] [phone], Ext. [phone] Second and Fourth Thursday of each month, opens at 6:30 AM; Clinic ends at 9 AM.  Patients are seen on a first-come first-served basis, and a  limited number are seen during each clinic.   [facility]  [address] Hillard Danker [place], [place] [phone]   Eligibility Requirements You must have lived in [place], [place], or [place] counties for at least the last three months.   You cannot be eligible for state or federal sponsored [employer], including [employer], [place], or [employer].   You generally cannot be eligible for healthcare insurance through your employer.    How to apply: Eligibility screenings are held every Tuesday and Wednesday afternoon from 1:00 pm until 4:00 pm. You do not need an appointment for the interview!  [facility] [address] [place], [place]   [place]  [place] Department  [place]  [phone]    Behavioral Health Resources in the Community: Intensive Outpatient Programs Organization         Address  Phone  Notes  [place]. [address] [place], [place] [phone]   [facility]patient [address], [place], [place]   ADS: Alcohol & Drug Svcs [address], [place], [place]   [place] [address]  [place], [place] or [phone]   Substance Abuse Resources Organization         Address  Phone  Notes  Alcohol and Drug Services  [phone]   [place]  [phone]   The [place]   Chinita Pester  [phone]   Residential & Outpatient Substance Abuse Program  [phone]   Psychological Services Organization         Address  Phone  Notes  [facility] [place]  [place]  [phone]   [place] [address], [place] or [phone]    Mobile Crisis Teams Organization          Address  Phone  Notes  [facility], Mobile Crisis Care Unit  [phone]   [facility]otherapeutic Services  [address] [place], [place]   [place] [address], [place] [phone]    Self-Help/Support Groups Organization         Address  Phone             Notes  [place]. of [place] - variety  of support groups  336- [phone] Call for more information  Narcotics Anonymous (NA), [facility] [address], [employer] [place]  2 meetings at this location   Residential Facilities manager         Address  Phone  Notes  [facility] [place],    [place]  [phone]   [facility] [place]  [address], [place] [phone], [place], [place]   [place], [place] [phone] Admissions: 8am-3pm M-F  Incentives Substance [place] [address],    [place], [place] [phone]   [facility] [address] [place], [place], [place]   [facility] [address],  [place], [place]   [facility]ight Programs - Intensive Outpatient [address], [place], [place]   [facility]  [place], [place] [phone] or [phone]   [facility] Services (RTS) [address] [place], [place] Accepts Medicaid  [facility] [place] [address],  [place] [phone] Substance Abuse/Addiction Treatment   [facility] Organization         Address  Phone  Notes  [facility]  [phone]   Domenic Schwab, PhD [address] Arlis Porta [place], [place]   [phone] or [phone]   [place], [place] [phone]   [facility] [address] [place], [place] [phone] Insurance/Medicaid/sponsorship  through [facility] and Families [address]                                    [place], [place] [phone] [place] [address], [place] [phone]    Dr. Adele Schilder  [phone]   [facility] Dept. 1) [address], [place] 2) [place] 3)  [place] [address], [place] [phone]  [phone]   [place] [phone] or [phone] (After Hours)

## 2015-03-05 NOTE — ED Provider Notes (Signed)
CSN: [id number]     Arrival date & time 03/05/15  2218 History   By signing my name below, I, Forrestine Him, attest that this documentation has been prepared under the direction and in the presence of Junius Creamer, [place] . Electronically Signed: Forrestine Him, ED Scribe. 03/05/2015. 11:07 PM.   Chief Complaint  Patient presents with  . Panic Attack   The history is provided by the patient. No language interpreter was used.    HPI Comments: [name] is a 29 y.o. female with a PMHx of HTN and panic attacks who presents to the Emergency Department here for intermittent, recurrent, ongoing panic attacks this evening. Associated "shaking" also reported. Pt states she is unable to identify a trigger for her panic attacks. However, she states she does not sleep well as she works two full time jobs. Denies eating 3 healthy balance meals daily and states she eats when she can. Ms. Linskey admits she is not complaint with hypertension medications. No previous history of DM. Denies any recent fever, chills, nausea, vomiting, shortness of breath, or chest pain. Was sleeping soundly when assessment started   Past Medical History  Diagnosis Date  . Bronchitis   . Hypertension   . Acid reflux   . Pregnancy induced hypertension   . Urinary tract infection   . History of PID   . Abnormal Pap smear     colposcopy, mild dysplasia, HPV  . Gonorrhea   . Chlamydia   . Trichomonas   . Depression     on meds for bipolar  . Anxiety     on meds- stable  . Obesity   . Sleep apnea     hasnt uses CPAP x 4 years  . Fibroid   . Panic attack    Past Surgical History  Procedure Laterality Date  . Wisdom tooth extraction    . Foot surgery    . Tubal ligation    . Cesarean section  05/02/03,05/04/08,10/23/10    x3  . Cholecystectomy N/A 10/07/2012    Procedure: LAPAROSCOPIC CHOLECYSTECTOMY WITH INTRAOPERATIVE CHOLANGIOGRAM;  Surgeon: Imogene Burn. Georgette Dover, MD;  [facility];  Service: General;  Laterality:  N/A;  . Umbilical hernia repair N/A 12/02/2012    Procedure: LAPAROSCOPIC UMBILICAL HERNIA;  Surgeon: Adin Hector, MD;  [facility];  Service: General;  Laterality: N/A;  . Insertion of mesh N/A 12/02/2012    Procedure: INSERTION OF MESH;  Surgeon: Adin Hector, MD;  [facility];  Service: General;  Laterality: N/A;   Family History  Problem Relation Age of Onset  . Other Neg Hx   . Hypertension Mother   . Heart disease Father   . Stroke Father    Social History  Substance Use Topics  . Smoking status: Former Smoker -- 0.25 packs/day for 1 years    Types: Cigarettes  . Smokeless tobacco: Never Used  . Alcohol Use: No     Comment: socially   OB History    Gravida Para Term Preterm AB TAB SAB Ectopic Multiple Living   3 3 3       3      Review of Systems  Constitutional: Negative for fever and chills.  Respiratory: Negative for cough and shortness of breath.   Cardiovascular: Negative for chest pain.  Gastrointestinal: Negative for nausea, vomiting, abdominal pain and diarrhea.  Skin: Negative for rash.  Neurological: Negative for headaches.  Psychiatric/Behavioral: Negative for confusion.  All other systems reviewed and are  negative.     Allergies  Review of patient's allergies indicates no known allergies.  Home Medications   Prior to Admission medications   Medication Sig Start Date End Date Taking? Authorizing Provider  hydrochlorothiazide (HYDRODIURIL) 25 MG tablet Take 1 tablet (25 mg total) by mouth daily. Patient not taking: Reported on 11/25/2014 01/26/14   West Pugh, NP  ibuprofen (ADVIL,MOTRIN) 200 MG tablet Take 400 mg by mouth every 6 (six) hours as needed.     Historical Provider, MD  ibuprofen (ADVIL,MOTRIN) 800 MG tablet Take 1 tablet (800 mg total) by mouth 3 (three) times daily. Patient not taking: Reported on 06/18/2014 01/26/14   [place] Pugh, NP  metroNIDAZOLE (FLAGYL) 500 MG tablet Take 1 tablet (500 mg total) by mouth 2  (two) times daily. 01/11/15   Luvenia Redden, PA-C   Triage Vitals: BP 139/76 mmHg  Pulse 80  Temp(Src) 98.9 F (37.2 C) (Oral)  Resp 20  SpO2 98%  LMP 02/09/2015   Physical Exam  Constitutional: She is oriented to person, place, and time. She appears well-developed and well-nourished.  HENT:  Head: Normocephalic.  Eyes: EOM are normal.  Neck: Normal range of motion.  Cardiovascular: Normal rate.   Pulmonary/Chest: Effort normal.  Abdominal: She exhibits no distension.  Musculoskeletal: Normal range of motion.  Neurological: She is alert and oriented to person, place, and time.  Skin: No rash noted.  Psychiatric: She has a normal mood and affect.  Nursing note and vitals reviewed.   ED Course  Procedures (including critical care time)  DIAGNOSTIC STUDIES: Oxygen Saturation is 98% on RA, Normal by my interpretation.    COORDINATION OF CARE: 11:07 PM- Will order CBG. Discussed treatment plan with pt at bedside and pt agreed to plan.     Labs Review Labs Reviewed  CBG MONITORING, ED    Imaging Review No results found. I have personally reviewed and evaluated these images and lab results as part of my medical decision-making.   EKG Interpretation None    blood sugar 98   MDM   Final diagnoses:  Shaking    I personally performed the services described in this documentation, which was scribed in my presence. The recorded information has been reviewed and is accurate.  Junius Creamer, NP 03/05/15 3382  Merrily Pew, MD 03/05/15 [phone]

## 2015-03-06 LAB — CBG MONITORING, ED: Glucose-Capillary: 96 mg/dL (ref 65–99)

## 2015-03-26 ENCOUNTER — Encounter (HOSPITAL_COMMUNITY): Payer: Self-pay | Admitting: *Deleted

## 2015-03-26 ENCOUNTER — Emergency Department (HOSPITAL_COMMUNITY)
Admission: EM | Admit: 2015-03-26 | Discharge: 2015-03-26 | Disposition: A | Discharge status: Home / Self Care | Payer: Medicaid Other | Attending: Emergency Medicine | Admitting: Emergency Medicine

## 2015-03-26 DIAGNOSIS — Z9851 Tubal ligation status: Secondary | ICD-10-CM | POA: Insufficient documentation

## 2015-03-26 DIAGNOSIS — Z86018 Personal history of other benign neoplasm: Secondary | ICD-10-CM | POA: Diagnosis not present

## 2015-03-26 DIAGNOSIS — Z9049 Acquired absence of other specified parts of digestive tract: Secondary | ICD-10-CM | POA: Diagnosis not present

## 2015-03-26 DIAGNOSIS — E669 Obesity, unspecified: Secondary | ICD-10-CM | POA: Insufficient documentation

## 2015-03-26 DIAGNOSIS — Z87891 Personal history of nicotine dependence: Secondary | ICD-10-CM | POA: Diagnosis not present

## 2015-03-26 DIAGNOSIS — I10 Essential (primary) hypertension: Secondary | ICD-10-CM | POA: Diagnosis not present

## 2015-03-26 DIAGNOSIS — Z3202 Encounter for pregnancy test, result negative: Secondary | ICD-10-CM | POA: Insufficient documentation

## 2015-03-26 DIAGNOSIS — Z87440 Personal history of urinary (tract) infections: Secondary | ICD-10-CM | POA: Diagnosis not present

## 2015-03-26 DIAGNOSIS — Z8669 Personal history of other diseases of the nervous system and sense organs: Secondary | ICD-10-CM | POA: Insufficient documentation

## 2015-03-26 DIAGNOSIS — Z8742 Personal history of other diseases of the female genital tract: Secondary | ICD-10-CM | POA: Diagnosis not present

## 2015-03-26 DIAGNOSIS — Z8709 Personal history of other diseases of the respiratory system: Secondary | ICD-10-CM | POA: Insufficient documentation

## 2015-03-26 DIAGNOSIS — K432 Incisional hernia without obstruction or gangrene: Secondary | ICD-10-CM | POA: Insufficient documentation

## 2015-03-26 DIAGNOSIS — Z8659 Personal history of other mental and behavioral disorders: Secondary | ICD-10-CM | POA: Insufficient documentation

## 2015-03-26 DIAGNOSIS — Z8619 Personal history of other infectious and parasitic diseases: Secondary | ICD-10-CM | POA: Diagnosis not present

## 2015-03-26 DIAGNOSIS — R109 Unspecified abdominal pain: Secondary | ICD-10-CM | POA: Diagnosis present

## 2015-03-26 LAB — URINALYSIS, ROUTINE W REFLEX MICROSCOPIC
Bilirubin Urine: NEGATIVE
Glucose, UA: NEGATIVE mg/dL
Ketones, ur: NEGATIVE mg/dL
Leukocytes, UA: NEGATIVE
Nitrite: NEGATIVE
Protein, ur: NEGATIVE mg/dL
Specific Gravity, Urine: 1.021 (ref 1.005–1.030)
Urobilinogen, UA: 1 mg/dL (ref 0.0–1.0)
pH: 8 (ref 5.0–8.0)

## 2015-03-26 LAB — COMPREHENSIVE METABOLIC PANEL
ALT: 11 U/L — ABNORMAL LOW (ref 14–54)
AST: 15 U/L (ref 15–41)
Albumin: 3.6 g/dL (ref 3.5–5.0)
Alkaline Phosphatase: 45 U/L (ref 38–126)
Anion gap: 5 (ref 5–15)
BUN: 6 mg/dL (ref 6–20)
CO2: 25 mmol/L (ref 22–32)
Calcium: 8.9 mg/dL (ref 8.9–10.3)
Chloride: 109 mmol/L (ref 101–111)
Creatinine, Ser: 0.63 mg/dL (ref 0.44–1.00)
GFR calc Af Amer: >60 mL/min (ref >60)
GFR calc non Af Amer: >60 mL/min (ref >60)
Glucose, Bld: 98 mg/dL (ref 65–99)
Potassium: 3.5 mmol/L (ref 3.5–5.1)
Sodium: 139 mmol/L (ref 135–145)
Total Bilirubin: 0.5 mg/dL (ref 0.3–1.2)
Total Protein: 7.4 g/dL (ref 6.5–8.1)

## 2015-03-26 LAB — CBC
HCT: 34.8 % — ABNORMAL LOW (ref 36.0–46.0)
Hemoglobin: 11.6 g/dL — ABNORMAL LOW (ref 12.0–15.0)
MCH: 28.1 pg (ref 26.0–34.0)
MCHC: 33.3 g/dL (ref 30.0–36.0)
MCV: 84.3 fL (ref 78.0–100.0)
Platelets: 315 10*3/uL (ref 150–400)
RBC: 4.13 MIL/uL (ref 3.87–5.11)
RDW: 13.4 % (ref 11.5–15.5)
WBC: 11.2 10*3/uL — ABNORMAL HIGH (ref 4.0–10.5)

## 2015-03-26 LAB — URINE MICROSCOPIC-ADD ON

## 2015-03-26 LAB — I-STAT BETA HCG BLOOD, ED (MC, WL, AP ONLY): I-stat hCG, quantitative: <5 m[IU]/mL (ref <5)

## 2015-03-26 LAB — LIPASE, BLOOD: Lipase: 23 U/L (ref 22–51)

## 2015-03-26 NOTE — ED Provider Notes (Signed)
CSN: [id number]     Arrival date & time 03/26/15  1034 History   First MD Initiated Contact with Patient 03/26/15 1205     Chief Complaint  Patient presents with  . Hernia  . Abdominal Pain     (Consider location/radiation/quality/duration/timing/severity/associated sxs/prior Treatment) HPI Comments: 29 year old female with history of hypertension, depression/anxiety, OSA, previous hernia repair who presents with hernia. The patient states that since March, she has noted an intermittent "pulled" over her previous hernia repair incision site. She occasionally has mild discomfort in that area but denies any pain currently. She denies any vomiting, abnormal bowel movements, urinary problems, fevers, or recent illness.  Patient is a 29 y.o. female presenting with abdominal pain. The history is provided by the patient.  Abdominal Pain   Past Medical History  Diagnosis Date  . Bronchitis   . Hypertension   . Acid reflux   . Pregnancy induced hypertension   . Urinary tract infection   . History of PID   . Abnormal Pap smear     colposcopy, mild dysplasia, HPV  . Gonorrhea   . Chlamydia   . Trichomonas   . Depression     on meds for bipolar  . Anxiety     on meds- stable  . Obesity   . Sleep apnea     hasnt uses CPAP x 4 years  . Fibroid   . Panic attack    Past Surgical History  Procedure Laterality Date  . Wisdom tooth extraction    . Foot surgery    . Tubal ligation    . Cesarean section  05/02/03,05/04/08,10/23/10    x3  . Cholecystectomy N/A 10/07/2012    Procedure: LAPAROSCOPIC CHOLECYSTECTOMY WITH INTRAOPERATIVE CHOLANGIOGRAM;  Surgeon: Imogene Burn. Georgette Dover, MD;  [facility];  Service: General;  Laterality: N/A;  . Umbilical hernia repair N/A 12/02/2012    Procedure: LAPAROSCOPIC UMBILICAL HERNIA;  Surgeon: Adin Hector, MD;  [facility];  Service: General;  Laterality: N/A;  . Insertion of mesh N/A 12/02/2012    Procedure: INSERTION OF MESH;  Surgeon: Adin Hector, MD;  [facility];  Service: General;  Laterality: N/A;   Family History  Problem Relation Age of Onset  . Other Neg Hx   . Hypertension Mother   . Heart disease Father   . Stroke Father    Social History  Substance Use Topics  . Smoking status: Former Smoker -- 0.25 packs/day for 1 years    Types: Cigarettes  . Smokeless tobacco: Never Used  . Alcohol Use: No     Comment: socially   OB History    Gravida Para Term Preterm AB TAB SAB Ectopic Multiple Living   3 3 3       3      Review of Systems  Gastrointestinal: Positive for abdominal pain.   10 Systems reviewed and are negative for acute change except as noted in the HPI.    Allergies  Ibuprofen and Tylenol  Home Medications   Prior to Admission medications   Medication Sig Start Date End Date Taking? Authorizing Provider  hydrochlorothiazide (HYDRODIURIL) 25 MG tablet Take 1 tablet (25 mg total) by mouth daily. Patient not taking: Reported on 11/25/2014 01/26/14   West Pugh, NP  metroNIDAZOLE (FLAGYL) 500 MG tablet Take 1 tablet (500 mg total) by mouth 2 (two) times daily. Patient not taking: Reported on 03/26/2015 01/11/15   Luvenia Redden, PA-C   BP 146/93 mmHg  Pulse  89  Temp(Src) 99 F (37.2 C) (Oral)  Resp 16  SpO2 97%  LMP 02/21/2015 Physical Exam  Constitutional: She is oriented to person, place, and time. She appears well-developed and well-nourished. No distress.  HENT:  Head: Normocephalic and atraumatic.  Moist mucous membranes  Eyes: Conjunctivae are normal. Pupils are equal, round, and reactive to light.  Neck: Neck supple.  Cardiovascular: Normal rate, regular rhythm and normal heart sounds.   No murmur heard. Pulmonary/Chest: Effort normal and breath sounds normal.  Abdominal: Soft. Bowel sounds are normal. She exhibits no distension. There is no tenderness.  Midline surgical scar w/ palpable scar tissue, no appreciable hernia noted  Musculoskeletal: She exhibits no  edema.  Neurological: She is alert and oriented to person, place, and time.  Fluent speech  Skin: Skin is warm and dry.  Psychiatric: She has a normal mood and affect. Judgment normal.  Nursing note and vitals reviewed.   ED Course  Procedures (including critical care time) Labs Review Labs Reviewed  COMPREHENSIVE METABOLIC PANEL - Abnormal; Notable for the following:    ALT 11 (*)    All other components within normal limits  CBC - Abnormal; Notable for the following:    WBC 11.2 (*)    Hemoglobin 11.6 (*)    HCT 34.8 (*)    All other components within normal limits  URINALYSIS, ROUTINE W REFLEX MICROSCOPIC (NOT AT [facility]) - Abnormal; Notable for the following:    APPearance CLOUDY (*)    Hgb urine dipstick SMALL (*)    All other components within normal limits  URINE MICROSCOPIC-ADD ON - Abnormal; Notable for the following:    Squamous Epithelial / LPF MANY (*)    All other components within normal limits  LIPASE, BLOOD  I-STAT BETA HCG BLOOD, ED ([facility], [facility], [facility] ONLY)    Imaging Review No results found. I have personally reviewed and evaluated these lab results as part of my medical decision-making.   EKG Interpretation None      MDM   Final diagnoses:  Incisional hernia, without obstruction or gangrene   29 year old female who presents with several months of intermittent "bulge" at her previous abdominal hernia repair site that is occasionally sore but she denies any significant abdominal pain. Patient well-appearing with normal vital signs. No appreciable hernia on exam although patient does have notable scar tissue. Based on report that the "bulge" is occasionally more noticeable, she may have a small residual incisional hernia but has no signs of incarceration or strangulation. Labwork obtained from triage is unremarkable. Instructed to follow-up with primary surgeon in the clinic to discuss treatment options. Reviewed return precautions including signs of incarceration.  Patient voiced understanding and was discharged in satisfactory condition.   Sharlett Iles, MD 03/26/15 1250

## 2015-03-26 NOTE — Discharge Instructions (Signed)
Hernia, Adult A hernia is the bulging of an organ or tissue through a weak spot in the muscles of the abdomen (abdominal wall). Hernias develop most often near the navel or groin. There are many kinds of hernias. Common kinds include:  Femoral hernia. This kind of hernia develops under the groin in the upper thigh area.  Inguinal hernia. This kind of hernia develops in the groin or scrotum.  Umbilical hernia. This kind of hernia develops near the navel.  Hiatal hernia. This kind of hernia causes part of the stomach to be pushed up into the chest.  Incisional hernia. This kind of hernia bulges through a scar from an abdominal surgery. CAUSES This condition may be caused by:  Heavy lifting.  Coughing over a long period of time.  Straining to have a bowel movement.  An incision made during an abdominal surgery.  A birth defect (congenital defect).  Excess weight or obesity.  Smoking.  Poor nutrition.  Cystic fibrosis.  Excess fluid in the abdomen.  Undescended testicles. SYMPTOMS Symptoms of a hernia include:  A lump on the abdomen. This is the first sign of a hernia. The lump may become more obvious with standing, straining, or coughing. It may get bigger over time if it is not treated or if the condition causing it is not treated.  Pain. A hernia is usually painless, but it may become painful over time if treatment is delayed. The pain is usually dull and may get worse with standing or lifting heavy objects. Sometimes a hernia gets tightly squeezed in the weak spot (strangulated) or stuck there (incarcerated) and causes additional symptoms. These symptoms may include:  Vomiting.  Nausea.  Constipation.  Irritability. DIAGNOSIS A hernia may be diagnosed with:  A physical exam. During the exam your health care provider may ask you to cough or to make a specific movement, because a hernia is usually more visible when you move.  Imaging tests. These can  include:  X-rays.  Ultrasound.  CT scan. TREATMENT A hernia that is small and painless may not need to be treated. A hernia that is large or painful may be treated with surgery. Inguinal hernias may be treated with surgery to prevent incarceration or strangulation. Strangulated hernias are always treated with surgery, because lack of blood to the trapped organ or tissue can cause it to die. Surgery to treat a hernia involves pushing the bulge back into place and repairing the weak part of the abdomen. HOME CARE INSTRUCTIONS  Avoid straining.  Do not lift anything heavier than 10 lb (4.5 kg).  Lift with your leg muscles, not your back muscles. This helps avoid strain.  When coughing, try to cough gently.  Prevent constipation. Constipation leads to straining with bowel movements, which can make a hernia worse or cause a hernia repair to break down. You can prevent constipation by:  Eating a high-fiber diet that includes plenty of fruits and vegetables.  Drinking enough fluids to keep your urine clear or pale yellow. Aim to drink 6-8 glasses of water per day.  Using a stool softener as directed by your health care provider.  Lose weight, if you are overweight.  Do not use any tobacco products, including cigarettes, chewing tobacco, or electronic cigarettes. If you need help quitting, ask your health care provider.  Keep all follow-up visits as directed by your health care provider. This is important. Your health care provider may need to monitor your condition. SEEK MEDICAL CARE IF:  You have   swelling, redness, and pain in the affected area.  Your bowel habits change. SEEK IMMEDIATE MEDICAL CARE IF:  You have a fever.  You have abdominal pain that is getting worse.  You feel nauseous or you vomit.  You cannot push the hernia back in place by gently pressing on it while you are lying down.  The hernia:  Changes in shape or size.  Is stuck outside the  abdomen.  Becomes discolored.  Feels hard or tender.   This information is not intended to replace advice given to you by your health care provider. Make sure you discuss any questions you have with your health care provider.   Document Released: 05/26/2005 Document Revised: 06/16/2014 Document Reviewed: 04/05/2014 Elsevier Interactive Patient Education 2016 Elsevier Inc.  

## 2015-03-26 NOTE — ED Notes (Signed)
Pt reports hx of abd hernia surgery, reports a bulge around belly button for several months, says it feels like a "pulling", also having left groin pain like she pulled a muscle. Pain 4/10. Hx of HTN, not taking medications.

## 2015-03-28 ENCOUNTER — Encounter: Payer: Self-pay | Admitting: Emergency Medicine

## 2015-03-28 ENCOUNTER — Emergency Department
Admission: EM | Admit: 2015-03-28 | Discharge: 2015-03-28 | Disposition: A | Discharge status: Home / Self Care | Payer: Medicaid Other | Attending: Emergency Medicine | Admitting: Emergency Medicine

## 2015-03-28 DIAGNOSIS — K432 Incisional hernia without obstruction or gangrene: Secondary | ICD-10-CM | POA: Diagnosis not present

## 2015-03-28 DIAGNOSIS — I10 Essential (primary) hypertension: Secondary | ICD-10-CM | POA: Insufficient documentation

## 2015-03-28 DIAGNOSIS — Z79899 Other long term (current) drug therapy: Secondary | ICD-10-CM | POA: Diagnosis not present

## 2015-03-28 DIAGNOSIS — Z3202 Encounter for pregnancy test, result negative: Secondary | ICD-10-CM | POA: Diagnosis not present

## 2015-03-28 DIAGNOSIS — K5900 Constipation, unspecified: Secondary | ICD-10-CM | POA: Diagnosis not present

## 2015-03-28 DIAGNOSIS — Z87891 Personal history of nicotine dependence: Secondary | ICD-10-CM | POA: Diagnosis not present

## 2015-03-28 DIAGNOSIS — E669 Obesity, unspecified: Secondary | ICD-10-CM | POA: Insufficient documentation

## 2015-03-28 DIAGNOSIS — R1084 Generalized abdominal pain: Secondary | ICD-10-CM

## 2015-03-28 DIAGNOSIS — R109 Unspecified abdominal pain: Secondary | ICD-10-CM | POA: Diagnosis present

## 2015-03-28 LAB — COMPREHENSIVE METABOLIC PANEL
ALT: 11 U/L — ABNORMAL LOW (ref 14–54)
AST: 17 U/L (ref 15–41)
Albumin: 3.6 g/dL (ref 3.5–5.0)
Alkaline Phosphatase: 42 U/L (ref 38–126)
Anion gap: 5 (ref 5–15)
BUN: 8 mg/dL (ref 6–20)
CO2: 24 mmol/L (ref 22–32)
Calcium: 8.6 mg/dL — ABNORMAL LOW (ref 8.9–10.3)
Chloride: 110 mmol/L (ref 101–111)
Creatinine, Ser: 0.72 mg/dL (ref 0.44–1.00)
GFR calc Af Amer: >60 mL/min (ref >60)
GFR calc non Af Amer: >60 mL/min (ref >60)
Glucose, Bld: 131 mg/dL — ABNORMAL HIGH (ref 65–99)
Potassium: 3.3 mmol/L — ABNORMAL LOW (ref 3.5–5.1)
Sodium: 139 mmol/L (ref 135–145)
Total Bilirubin: 0.5 mg/dL (ref 0.3–1.2)
Total Protein: 7.2 g/dL (ref 6.5–8.1)

## 2015-03-28 LAB — URINALYSIS COMPLETE WITH MICROSCOPIC (ARMC ONLY)
Bacteria, UA: NONE SEEN
Bilirubin Urine: NEGATIVE
Glucose, UA: NEGATIVE mg/dL
Ketones, ur: NEGATIVE mg/dL
Leukocytes, UA: NEGATIVE
Nitrite: NEGATIVE
Protein, ur: NEGATIVE mg/dL
Specific Gravity, Urine: 1.021 (ref 1.005–1.030)
pH: 6 (ref 5.0–8.0)

## 2015-03-28 LAB — CBC
HCT: 36.3 % (ref 35.0–47.0)
Hemoglobin: 12.2 g/dL (ref 12.0–16.0)
MCH: 28.2 pg (ref 26.0–34.0)
MCHC: 33.6 g/dL (ref 32.0–36.0)
MCV: 83.8 fL (ref 80.0–100.0)
Platelets: 272 10*3/uL (ref 150–440)
RBC: 4.33 MIL/uL (ref 3.80–5.20)
RDW: 13.9 % (ref 11.5–14.5)
WBC: 9.4 10*3/uL (ref 3.6–11.0)

## 2015-03-28 LAB — POCT PREGNANCY, URINE: Preg Test, Ur: NEGATIVE

## 2015-03-28 NOTE — ED Provider Notes (Signed)
[facility] Emergency Department Provider Note  ____________________________________________  Time seen: Approximately 2:01 PM  I have reviewed the triage vital signs and the nursing notes.   HISTORY  Chief Complaint Abdominal Pain    HPI [name] is a 29 y.o. female with a history that includes obesity and umbilical hernia repair with mesh insertion approximately 2 years ago in [place] (Dr. Johney Maine) due to incarceration who presents with what she describes as several months of intermittent discomfort around the site of her incision.  She states that sometimes it "bulges out" and it intermittently feels uncomfortable with burning and pain.  She states that sometimes she has nausea and has vomited several times recently.  She describes the pain as moderate when it happens but it typically is brief.  She is currently in no distress at this time.  She states that the bulge goes back in by itself.  She also reports that she has had some constipation recently with "hard balls" of stool.  She denies taking any narcotics.  Nothing makes the pain better nothing makes it worse.  She denies fever/chills, chest pain, shortness of breath, dysuria, vaginal bleeding, vaginal discharge.   Past Medical History  Diagnosis Date  . Bronchitis   . Hypertension   . Acid reflux   . Pregnancy induced hypertension   . Urinary tract infection   . History of PID   . Abnormal Pap smear     colposcopy, mild dysplasia, HPV  . Gonorrhea   . Chlamydia   . Trichomonas   . Depression     on meds for bipolar  . Anxiety     on meds- stable  . Obesity   . Sleep apnea     hasnt uses CPAP x 4 years  . Fibroid   . Panic attack     Patient Active Problem List   Diagnosis Date Noted  . Incarcerated incisional hernia s/p lap repair 12/02/2012 12/03/2012  . Hypertension   . Acid reflux   . Anxiety   . Obesity, Class III, BMI 40-49.9 (morbid obesity) ([place])   . Sleep apnea   .  Chronic cholecystitis with calculus s/p lap chole [id number] 10/04/2012    Past Surgical History  Procedure Laterality Date  . Wisdom tooth extraction    . Foot surgery    . Tubal ligation    . Cesarean section  05/02/03,05/04/08,10/23/10    x3  . Cholecystectomy N/A 10/07/2012    Procedure: LAPAROSCOPIC CHOLECYSTECTOMY WITH INTRAOPERATIVE CHOLANGIOGRAM;  Surgeon: Imogene Burn. Georgette Dover, MD;  Location: [facility] ORS;  Service: General;  Laterality: N/A;  . Umbilical hernia repair N/A 12/02/2012    Procedure: LAPAROSCOPIC UMBILICAL HERNIA;  Surgeon: Adin Hector, MD;  Location: [facility] ORS;  Service: General;  Laterality: N/A;  . Insertion of mesh N/A 12/02/2012    Procedure: INSERTION OF MESH;  Surgeon: Adin Hector, MD;  Location: [facility] OR[facility];  Service: General;  Laterality: N/A;  . Abdominal surgery      Current Outpatient Rx  Name  Route  Sig  Dispense  Refill  . hydrochlorothiazide (HYDRODIURIL) 25 MG tablet   Oral   Take 1 tablet (25 mg total) by mouth daily. Patient not taking: Reported on 11/25/2014   30 tablet   1   . metroNIDAZOLE (FLAGYL) 500 MG tablet   Oral   Take 1 tablet (500 mg total) by mouth 2 (two) times daily. Patient not taking: Reported on 03/26/2015   14 tablet  0     Allergies Ibuprofen and Tylenol  Family History  Problem Relation Age of Onset  . Other Neg Hx   . Hypertension Mother   . Heart disease Father   . Stroke Father     Social History Social History  Substance Use Topics  . Smoking status: Former Smoker -- 0.25 packs/day for 1 years    Types: Cigarettes  . Smokeless tobacco: Never Used  . Alcohol Use: No     Comment: socially    Review of Systems Constitutional: No fever/chills Eyes: No visual changes. ENT: No sore throat. Cardiovascular: Denies chest pain. Respiratory: Denies shortness of breath. Gastrointestinal: Mild intermittent burning abdominal pain around the site of her incision with a "bulge", some intermittent constipation with hard  balls of stool, intermittent mild nausea with 2 episodes of vomiting. Genitourinary: Negative for dysuria. Musculoskeletal: Negative for back pain. Skin: Negative for rash. Neurological: Negative for headaches, focal weakness or numbness.  10-point ROS otherwise negative.  ____________________________________________   PHYSICAL EXAM:  VITAL SIGNS: ED Triage Vitals  Enc Vitals Group     BP 03/28/15 1115 137/88 mmHg     Pulse Rate 03/28/15 1115 78     Resp 03/28/15 1115 18     Temp 03/28/15 1115 98.5 F (36.9 C)     Temp Source 03/28/15 1115 Oral     SpO2 03/28/15 1115 99 %     Weight 03/28/15 1115 257 lb (116.574 kg)     Height 03/28/15 1115 5\' 6"  (1.676 m)     Head Cir --      Peak Flow --      Pain Score 03/28/15 1116 5     Pain Loc --      Pain Edu? --      Excl. in [place]? --     Constitutional: Alert and oriented. Well appearing and in no acute distress. Eyes: Conjunctivae are normal. PERRL. EOMI. Head: Atraumatic. Nose: No congestion/rhinnorhea. Mouth/Throat: Mucous membranes are moist.  Oropharynx non-erythematous. Neck: No stridor.   Cardiovascular: Normal rate, regular rhythm. Grossly normal heart sounds.  Good peripheral circulation. Respiratory: Normal respiratory effort.  No retractions. Lungs CTAB. Gastrointestinal: Obese, Soft and nontender. No distention. No abdominal bruits. No CVA tenderness.  Scar tissue is palpable at the site of the well-appearing umbilical hernia scar, but there is no evidence of hernia even when she bears down.  There is no sign of incarceration or strangulation.  There is no tenderness to palpation. Musculoskeletal: No lower extremity tenderness nor edema.  No joint effusions. Neurologic:  Normal speech and language. No gross focal neurologic deficits are appreciated.  Skin:  Skin is warm, dry and intact. No rash noted. Psychiatric: Mood and affect are normal. Speech and behavior are  normal.  ____________________________________________   LABS (all labs ordered are listed, but only abnormal results are displayed)  Labs Reviewed  COMPREHENSIVE METABOLIC PANEL - Abnormal; Notable for the following:    Potassium 3.3 (*)    Glucose, Bld 131 (*)    Calcium 8.6 (*)    ALT 11 (*)    All other components within normal limits  URINALYSIS COMPLETEWITH MICROSCOPIC ([facility] ONLY) - Abnormal; Notable for the following:    Color, Urine YELLOW (*)    APPearance CLEAR (*)    Hgb urine dipstick 2+ (*)    Squamous Epithelial / LPF 0-5 (*)    All other components within normal limits  CBC  POC URINE PREG, ED  POCT PREGNANCY, URINE  ____________________________________________  EKG  Not indicated ____________________________________________  RADIOLOGY   No results found.  ____________________________________________   PROCEDURES  Procedure(s) performed: None  Critical Care performed: No ____________________________________________   INITIAL IMPRESSION / ASSESSMENT AND PLAN / ED COURSE  Pertinent labs & imaging results that were available during my care of the patient were reviewed by me and considered in my medical decision making (see chart for details).  The patient is well-appearing and in no acute distress with normal vital signs and afebrile.  She was seen at [facility] emergency department 2 days ago with the same complaint and was discharged with the recommendation to follow-up with her surgeon as an outpatient.  During the course of my history of present illness I asked the patient if she has followed up with her surgeon and she said no, and I asked if she is seen any other providers recently and she said no.  I asked her about her emergency department visit and she said "they did not want to give me any pain medicine, they weren't having any of that".  I explained that narcotic pain medicine is not indicated for this kind of issue and that she does need  to follow up with surgeon.  I provided her with information about over-the-counter treatment for constipation and our local surgeon's phone number.  I am easily prepared for discharge instructions but by the time they were printed the patient had left without discharge information.  ____________________________________________  FINAL CLINICAL IMPRESSION(S) / ED DIAGNOSES  Final diagnoses:  Incisional hernia, without obstruction or gangrene  Generalized abdominal discomfort  Constipation, unspecified constipation type      NEW MEDICATIONS STARTED DURING THIS VISIT:  Discharge Medication List as of 03/28/2015  2:06 PM       Hinda Kehr, MD 03/28/15 1414

## 2015-03-28 NOTE — Discharge Instructions (Signed)
As we discussed, your abdominal exam is reassuring.  Your best bet is to follow-up with a surgeon, either our original surgeon in [place] or the surgeon group for which we gave you contact information today.  Take regular over-the-counter pain medicine as needed for discomfort.  Also, please refer to the information below for recommendations on how to deal with constipation, including dietary changes and over-the-counter medications.  Return to the ED if your abdominal pain worsens or fails to improve, you develop bloody vomiting, bloody diarrhea, you are unable to tolerate fluids due to vomiting, fever greater than 101, or other symptoms that concern you.  Constipation recommendations: 1)  Colace (or Dulcolax) 100 mg:  This is a stool softener, and you may take it once or twice a day as needed. 2)  Senna tablets:  This is a bowel stimulant that will help "push" out your stool. It is the next step to add after you have tried a stool softener. 3)  Miralax (powder):  This medication works by drawing additional fluid into your intestines and helps to flush out your stool.  Mix the powder with water or juice according to label instructions.  It may help if the Colace and Senna are not sufficient, but you must be sure to use the recommended amount of water or juice when you mix up the powder. Remember that narcotic pain medications are constipating, so avoid them or minimize their use.  Drink plenty of fluids.    Hernia A hernia happens when an organ or tissue inside your body pushes out through a weak spot in the belly (abdomen). HOME CARE  Avoid stretching or overusing (straining) the muscles near the hernia.  Do not lift anything heavier than 10 lb (4.5 kg).  Use the muscles in your leg when you lift something up. Do not use the muscles in your back.  When you cough, try to cough gently.  Eat a diet that has a lot of fiber. Eat lots of fruits and vegetables.  Drink enough fluids to keep  your pee (urine) clear or pale yellow. Try to drink 6-8 glasses of water a day.  Take medicines to make your poop soft (stool softeners) as told by your doctor.  Lose weight, if you are overweight.  Do not use any tobacco products, including cigarettes, chewing tobacco, or electronic cigarettes. If you need help quitting, ask your doctor.  Keep all follow-up visits as told by your doctor. This is important. GET HELP IF:  The skin by the hernia gets puffy (swollen) or red.  The hernia is painful. GET HELP RIGHT AWAY IF:  You have a fever.  You have belly pain that is getting worse.  You feel sick to your stomach (nauseous) or you throw up (vomit).  You cannot push the hernia back in place by gently pressing on it while you are lying down.  The hernia:  Changes in shape or size.  Is stuck outside your belly.  Changes color.  Feels hard or tender.   This information is not intended to replace advice given to you by your health care provider. Make sure you discuss any questions you have with your health care provider.   Document Released: 11/13/2009 Document Revised: 06/16/2014 Document Reviewed: 04/05/2014 Elsevier Interactive Patient Education 2016 Elsevier Inc.  Abdominal Pain, Adult Many things can cause belly (abdominal) pain. Most times, the belly pain is not dangerous. Many cases of belly pain can be watched and treated at home. HOME CARE  Do not take medicines that help you go poop (laxatives) unless told to by your doctor.  Only take medicine as told by your doctor.  Eat or drink as told by your doctor. Your doctor will tell you if you should be on a special diet. GET HELP IF:  You do not know what is causing your belly pain.  You have belly pain while you are sick to your stomach (nauseous) or have runny poop (diarrhea).  You have pain while you pee or poop.  Your belly pain wakes you up at night.  You have belly pain that gets worse or better when  you eat.  You have belly pain that gets worse when you eat fatty foods.  You have a fever. GET HELP RIGHT AWAY IF:   The pain does not go away within 2 hours.  You keep throwing up (vomiting).  The pain changes and is only in the right or left part of the belly.  You have bloody or tarry looking poop. MAKE SURE YOU:   Understand these instructions.  Will watch your condition.  Will get help right away if you are not doing well or get worse.   This information is not intended to replace advice given to you by your health care provider. Make sure you discuss any questions you have with your health care provider.   Document Released: 11/12/2007 Document Revised: 06/16/2014 Document Reviewed: 02/02/2013 Elsevier Interactive Patient Education 2016 [employer].  Constipation, Adult Constipation is when a person:  Poops (has a bowel movement) less than 3 times a week.  Has a hard time pooping.  Has poop that is dry, hard, or bigger than normal. HOME CARE   Eat foods with a lot of fiber in them. This includes fruits, vegetables, beans, and whole grains such as brown rice.  Avoid fatty foods and foods with a lot of sugar. This includes french fries, hamburgers, cookies, candy, and soda.  If you are not getting enough fiber from food, take products with added fiber in them (supplements).  Drink enough fluid to keep your pee (urine) clear or pale yellow.  Exercise on a regular basis, or as told by your doctor.  Go to the restroom when you feel like you need to poop. Do not hold it.  Only take medicine as told by your doctor. Do not take medicines that help you poop (laxatives) without talking to your doctor first. GET HELP RIGHT AWAY IF:   You have bright red blood in your poop (stool).  Your constipation lasts more than 4 days or gets worse.  You have belly (abdominal) or butt (rectal) pain.  You have thin poop (as thin as a pencil).  You lose weight, and it cannot  be explained. MAKE SURE YOU:   Understand these instructions.  Will watch your condition.  Will get help right away if you are not doing well or get worse.   This information is not intended to replace advice given to you by your health care provider. Make sure you discuss any questions you have with your health care provider.   Document Released: 11/12/2007 Document Revised: 06/16/2014 Document Reviewed: 03/07/2013 Elsevier Interactive Patient Education [employer].

## 2015-03-28 NOTE — ED Notes (Signed)
Pt here with c/o burning and pain at umbilicus, states she had umbilical hernia surgery a year ago, and it has been giving her pain lately. Pt appears in no distress, states she is having occasional nausea, some constipation, however had small, hard bm yesterday.

## 2015-06-15 ENCOUNTER — Emergency Department (HOSPITAL_COMMUNITY)
Admission: EM | Admit: 2015-06-15 | Discharge: 2015-06-15 | Disposition: A | Discharge status: Home / Self Care | Payer: Medicaid Other | Attending: Emergency Medicine | Admitting: Emergency Medicine

## 2015-06-15 ENCOUNTER — Emergency Department (HOSPITAL_COMMUNITY): Payer: Medicaid Other

## 2015-06-15 ENCOUNTER — Encounter (HOSPITAL_COMMUNITY): Payer: Self-pay | Admitting: Emergency Medicine

## 2015-06-15 DIAGNOSIS — Z8669 Personal history of other diseases of the nervous system and sense organs: Secondary | ICD-10-CM | POA: Insufficient documentation

## 2015-06-15 DIAGNOSIS — Z8619 Personal history of other infectious and parasitic diseases: Secondary | ICD-10-CM | POA: Insufficient documentation

## 2015-06-15 DIAGNOSIS — Z87891 Personal history of nicotine dependence: Secondary | ICD-10-CM | POA: Insufficient documentation

## 2015-06-15 DIAGNOSIS — E669 Obesity, unspecified: Secondary | ICD-10-CM | POA: Insufficient documentation

## 2015-06-15 DIAGNOSIS — Z8659 Personal history of other mental and behavioral disorders: Secondary | ICD-10-CM | POA: Insufficient documentation

## 2015-06-15 DIAGNOSIS — I10 Essential (primary) hypertension: Secondary | ICD-10-CM | POA: Insufficient documentation

## 2015-06-15 DIAGNOSIS — M25512 Pain in left shoulder: Secondary | ICD-10-CM | POA: Diagnosis present

## 2015-06-15 DIAGNOSIS — Z8719 Personal history of other diseases of the digestive system: Secondary | ICD-10-CM | POA: Diagnosis not present

## 2015-06-15 DIAGNOSIS — Z87440 Personal history of urinary (tract) infections: Secondary | ICD-10-CM | POA: Insufficient documentation

## 2015-06-15 DIAGNOSIS — Z86018 Personal history of other benign neoplasm: Secondary | ICD-10-CM | POA: Diagnosis not present

## 2015-06-15 DIAGNOSIS — Z8742 Personal history of other diseases of the female genital tract: Secondary | ICD-10-CM | POA: Insufficient documentation

## 2015-06-15 MED ORDER — METHOCARBAMOL 500 MG PO TABS
500.0000 mg | ORAL_TABLET | Freq: Once | ORAL | Status: AC
  Administered 2015-06-15: 500 mg via ORAL
  Filled 2015-06-15: qty 1

## 2015-06-15 MED ORDER — METHOCARBAMOL 500 MG PO TABS: 500.0000 mg | ORAL_TABLET | Freq: Two times a day (BID) | ORAL | Status: DC

## 2015-06-15 NOTE — ED Provider Notes (Signed)
CSN: [id number]     Arrival date & time 06/15/15  0159 History  By signing my name below, I, [name], attest that this documentation has been prepared under the direction and in the presence of Bronson Bressman, MD. Electronically Signed: [name], ED Scribe. 06/15/2015. 3:27 AM.    Chief Complaint  Patient presents with  . Shoulder Pain   Patient is a 30 y.o. female presenting with shoulder pain. The history is provided by the patient. No language interpreter was used.  Shoulder Pain Location:  Shoulder Injury: no   Shoulder location:  L shoulder Pain details:    Quality:  Aching   Radiates to:  Does not radiate   Severity:  Moderate   Onset quality:  Gradual   Timing:  Constant   Progression:  Unable to specify Chronicity:  Chronic Handedness:  Right-handed Dislocation: no   Foreign body present:  No foreign bodies Relieved by:  Nothing Worsened by:  Nothing tried Ineffective treatments:  None tried Associated symptoms: no muscle weakness, no neck pain, no numbness, no stiffness and no swelling   Risk factors: no frequent fractures    HPI Comments: [name] is a 30 y.o. female who presents to the Emergency Department complaining of intermittent, moderate left shoulder pain down the elbow with intermittent pain in the fingertips onset several weeks ago; no tx's tried.  She states her shoulder "popped out of joint but but went back in" several days ago with no change in pain.      Past Medical History  Diagnosis Date  . Bronchitis   . Hypertension   . Acid reflux   . Pregnancy induced hypertension   . Urinary tract infection   . History of PID   . Abnormal Pap smear     colposcopy, mild dysplasia, HPV  . Gonorrhea   . Chlamydia   . Trichomonas   . Depression     on meds for bipolar  . Anxiety     on meds- stable  . Obesity   . Sleep apnea     hasnt uses CPAP x 4 years  . Fibroid   . Panic attack    Past Surgical History  Procedure Laterality  Date  . Wisdom tooth extraction    . Foot surgery    . Tubal ligation    . Cesarean section  05/02/03,05/04/08,10/23/10    x3  . Cholecystectomy N/A 10/07/2012    Procedure: LAPAROSCOPIC CHOLECYSTECTOMY WITH INTRAOPERATIVE CHOLANGIOGRAM;  Surgeon: Imogene Burn. Georgette Dover, MD;  Location: [facility];  Service: General;  Laterality: N/A;  . Umbilical hernia repair N/A 12/02/2012    Procedure: LAPAROSCOPIC UMBILICAL HERNIA;  Surgeon: Adin Hector, MD;  Location: [facility];  Service: General;  Laterality: N/A;  . Insertion of mesh N/A 12/02/2012    Procedure: INSERTION OF MESH;  Surgeon: Adin Hector, MD;  Location: [facility];  Service: General;  Laterality: N/A;  . Abdominal surgery     Family History  Problem Relation Age of Onset  . Other Neg Hx   . Hypertension Mother   . Heart disease Father   . Stroke Father    Social History  Substance Use Topics  . Smoking status: Former Smoker -- 0.25 packs/day for 1 years    Types: Cigarettes  . Smokeless tobacco: Never Used  . Alcohol Use: No     Comment: socially   OB History    Gravida Para Term Preterm AB TAB SAB Ectopic Multiple Living  3 3 3       3      Review of Systems  Musculoskeletal: Negative for stiffness and neck pain.  All other systems reviewed and are negative.  10 Systems reviewed and all are negative for acute change except as noted in the HPI.   Allergies  Ibuprofen and Tylenol  Home Medications   Prior to Admission medications   Medication Sig Start Date End Date Taking? Authorizing Provider  hydrochlorothiazide (HYDRODIURIL) 25 MG tablet Take 1 tablet (25 mg total) by mouth daily. Patient not taking: Reported on 11/25/2014 01/26/14   West Pugh, NP  metroNIDAZOLE (FLAGYL) 500 MG tablet Take 1 tablet (500 mg total) by mouth 2 (two) times daily. Patient not taking: Reported on 03/26/2015 01/11/15   Luvenia Redden, PA-C   BP 129/103 mmHg  Pulse 75  Temp(Src) 98.5 F (36.9 C) (Oral)  Resp 18  Ht 5\' 6"  (1.676 m)  Wt  260 lb (117.935 kg)  BMI 41.99 kg/m2  SpO2 100%  LMP 05/21/2015 Physical Exam  Constitutional: She is oriented to person, place, and time. She appears well-developed and well-nourished. No distress.  HENT:  Head: Normocephalic and atraumatic.  Mouth/Throat: Oropharynx is clear and moist.  Eyes: Conjunctivae and EOM are normal. Pupils are equal, round, and reactive to light.  Neck: Normal range of motion. Neck supple. No tracheal deviation present.  Cardiovascular: Normal rate and regular rhythm.   Pulmonary/Chest: Effort normal and breath sounds normal. No respiratory distress. She has no wheezes. She has no rales.  Abdominal: Soft. Bowel sounds are normal. There is no tenderness. There is no rebound and no guarding.  Musculoskeletal: Normal range of motion. She exhibits no edema or tenderness.       Left shoulder: Normal. She exhibits normal range of motion, no tenderness, no bony tenderness, no swelling, no effusion, no crepitus, no deformity, no laceration, no pain, no spasm, normal pulse and normal strength.       Left elbow: Normal.       Left wrist: Normal.       Left forearm: Normal.       Left hand: Normal. Normal sensation noted. Normal strength noted.  Negative Neer's test.  Biceps and triceps DTR's 2+.  brachioradialis 2+ .  Both tendons of the bicep intact.  Intact suponation and pronation. Cap refill < 2 seconds to all digits of left hand.  NVI left hand.  No snuff box.  No winging of the scapula.    Neurological: She is alert and oriented to person, place, and time. She has normal reflexes.  Skin: Skin is warm and dry.  Psychiatric: She has a normal mood and affect. Her behavior is normal.  Nursing note and vitals reviewed.   ED Course  Procedures (including critical care time)  DIAGNOSTIC STUDIES: Oxygen Saturation is 100% on RA, normal by my interpretation.    COORDINATION OF CARE:  3:43 AM Discussed treatment plan with patient at bedside.  Patient acknowledges and  agrees with plan.    Labs Review Labs Reviewed - No data to display  Imaging Review Dg Shoulder Left  06/15/2015  CLINICAL DATA:  Left shoulder pain after injury. EXAM: LEFT SHOULDER - 2+ VIEW COMPARISON:  None. FINDINGS: No fracture or dislocation. The alignment and joint spaces are maintained. No focal soft tissue abnormality. IMPRESSION: No fracture or dislocation of the left shoulder. Electronically Signed   By: Jeb Levering M.D.   On: 06/15/2015 03:14   I have personally  reviewed and evaluated these images and lab results as part of my medical decision-making.   EKG Interpretation None      MDM   Final diagnoses:  None    Shoulder pain no evidence of bankhardt lesion or hill sachs deformity highly doubt dislocation.  Will treat with muscle relaxants and ice as patient is allergy to be tylenol and NSAIDs  I personally performed the services described in this documentation, which was scribed in my presence. The recorded information has been reviewed and is accurate.      Veatrice Kells, MD 06/15/15 [phone]

## 2015-06-15 NOTE — ED Notes (Signed)
Patient reports approximately a few weeks ago "popped shoulder out of socket playing with boyfriend and popped it back in socket". C/o intermittent shoulder pain radiating down into elbow. Rates pain 5/10.

## 2015-11-05 ENCOUNTER — Inpatient Hospital Stay (HOSPITAL_COMMUNITY)
Admission: AD | Admit: 2015-11-05 | Discharge: 2015-11-06 | Disposition: A | Discharge status: Home / Self Care | Payer: Medicaid Other | Source: Ambulatory Visit | Attending: Family Medicine | Admitting: Family Medicine

## 2015-11-05 DIAGNOSIS — A6000 Herpesviral infection of urogenital system, unspecified: Secondary | ICD-10-CM | POA: Insufficient documentation

## 2015-11-05 DIAGNOSIS — K219 Gastro-esophageal reflux disease without esophagitis: Secondary | ICD-10-CM | POA: Insufficient documentation

## 2015-11-05 DIAGNOSIS — B9689 Other specified bacterial agents as the cause of diseases classified elsewhere: Secondary | ICD-10-CM

## 2015-11-05 DIAGNOSIS — Z3202 Encounter for pregnancy test, result negative: Secondary | ICD-10-CM | POA: Diagnosis not present

## 2015-11-05 DIAGNOSIS — B009 Herpesviral infection, unspecified: Secondary | ICD-10-CM

## 2015-11-05 DIAGNOSIS — I10 Essential (primary) hypertension: Secondary | ICD-10-CM | POA: Diagnosis not present

## 2015-11-05 DIAGNOSIS — A499 Bacterial infection, unspecified: Secondary | ICD-10-CM | POA: Diagnosis not present

## 2015-11-05 DIAGNOSIS — F418 Other specified anxiety disorders: Secondary | ICD-10-CM | POA: Diagnosis not present

## 2015-11-05 DIAGNOSIS — Z87891 Personal history of nicotine dependence: Secondary | ICD-10-CM | POA: Diagnosis not present

## 2015-11-05 DIAGNOSIS — G4730 Sleep apnea, unspecified: Secondary | ICD-10-CM | POA: Diagnosis not present

## 2015-11-05 DIAGNOSIS — R109 Unspecified abdominal pain: Secondary | ICD-10-CM | POA: Diagnosis present

## 2015-11-05 DIAGNOSIS — N760 Acute vaginitis: Secondary | ICD-10-CM | POA: Diagnosis not present

## 2015-11-05 LAB — URINALYSIS, ROUTINE W REFLEX MICROSCOPIC
Bilirubin Urine: NEGATIVE
Glucose, UA: NEGATIVE mg/dL
Ketones, ur: NEGATIVE mg/dL
Leukocytes, UA: NEGATIVE
Nitrite: NEGATIVE
Protein, ur: NEGATIVE mg/dL
Specific Gravity, Urine: >1.03 — ABNORMAL HIGH (ref 1.005–1.030)
pH: 6 (ref 5.0–8.0)

## 2015-11-05 LAB — URINE MICROSCOPIC-ADD ON: WBC, UA: NONE SEEN WBC/hpf (ref 0–5)

## 2015-11-05 LAB — POCT PREGNANCY, URINE: Preg Test, Ur: NEGATIVE

## 2015-11-05 NOTE — MAU Note (Signed)
Pt reports she has a history of herpes and thinks she is currently having an outbreak, does not have meds at home. Pt states she has been having a "fluttery" feeling in her lower abd off/on for "some months" .

## 2015-11-06 DIAGNOSIS — A499 Bacterial infection, unspecified: Secondary | ICD-10-CM

## 2015-11-06 DIAGNOSIS — B009 Herpesviral infection, unspecified: Secondary | ICD-10-CM | POA: Diagnosis not present

## 2015-11-06 DIAGNOSIS — N760 Acute vaginitis: Secondary | ICD-10-CM

## 2015-11-06 LAB — WET PREP, GENITAL
Sperm: NONE SEEN
Trich, Wet Prep: NONE SEEN
Yeast Wet Prep HPF POC: NONE SEEN

## 2015-11-06 MED ORDER — VALACYCLOVIR HCL 500 MG PO TABS: 500.0000 mg | ORAL_TABLET | Freq: Two times a day (BID) | ORAL | Status: DC

## 2015-11-06 MED ORDER — AMLODIPINE BESYLATE 5 MG PO TABS
5.0000 mg | ORAL_TABLET | Freq: Once | ORAL | Status: AC
  Administered 2015-11-06: 5 mg via ORAL
  Filled 2015-11-06: qty 1

## 2015-11-06 MED ORDER — AMLODIPINE BESYLATE 5 MG PO TABS: 5.0000 mg | ORAL_TABLET | Freq: Every day | ORAL | Status: DC

## 2015-11-06 MED ORDER — METRONIDAZOLE 500 MG PO TABS: 500.0000 mg | ORAL_TABLET | Freq: Two times a day (BID) | ORAL | Status: DC

## 2015-11-06 NOTE — MAU Provider Note (Signed)
History     CSN: [id number]  Arrival date and time: 11/05/15 2240   First Provider Initiated Contact with Patient 11/06/15 0016      Chief Complaint  Patient presents with  . Abdominal Pain   HPI [name] is a 30 y.o. [phone] who presents to MAU today with complaint of possible HSV outbreak and intermittent abdominal fluttering. The patient states that she has noted this intermittent fluttering in the lower abdomen for months. She denies pain today. She denies vaginal bleeding, fever, N/V/D or constipation or UTI symptoms. The patient has a history of HSV, and feels she might have a new outbreak since Wednesday. She is unsure about current sores. She has noted some external burning with urination. She denies urinary retention. She denies vaginal bleeding, but has had a small amount of thin, white discharge without odor. The patient has CHTN and has not taken her medication in 1 week.   OB History    Gravida Para Term Preterm AB TAB SAB Ectopic Multiple Living   3 3 3       3       Past Medical History  Diagnosis Date  . Bronchitis   . Hypertension   . Acid reflux   . Pregnancy induced hypertension   . Urinary tract infection   . History of PID   . Abnormal Pap smear     colposcopy, mild dysplasia, HPV  . Gonorrhea   . Chlamydia   . Trichomonas   . Depression     on meds for bipolar  . Anxiety     on meds- stable  . Obesity   . Sleep apnea     hasnt uses CPAP x 4 years  . Fibroid   . Panic attack     Past Surgical History  Procedure Laterality Date  . Wisdom tooth extraction    . Foot surgery    . Tubal ligation    . Cesarean section  05/02/03,05/04/08,10/23/10    x3  . Cholecystectomy N/A 10/07/2012    Procedure: LAPAROSCOPIC CHOLECYSTECTOMY WITH INTRAOPERATIVE CHOLANGIOGRAM;  Surgeon: Imogene Burn. Georgette Dover, MD;  Location: [facility];  Service: General;  Laterality: N/A;  . Umbilical hernia repair N/A 12/02/2012    Procedure: LAPAROSCOPIC UMBILICAL HERNIA;   Surgeon: Adin Hector, MD;  Location: [facility];  Service: General;  Laterality: N/A;  . Insertion of mesh N/A 12/02/2012    Procedure: INSERTION OF MESH;  Surgeon: Adin Hector, MD;  Location: [facility];  Service: General;  Laterality: N/A;  . Abdominal surgery      Family History  Problem Relation Age of Onset  . Other Neg Hx   . Hypertension Mother   . Heart disease Father   . Stroke Father     Social History  Substance Use Topics  . Smoking status: Former Smoker -- 0.25 packs/day for 1 years    Types: Cigarettes  . Smokeless tobacco: Never Used  . Alcohol Use: No     Comment: socially    Allergies:  Allergies  Allergen Reactions  . Ibuprofen Hives and Itching  . Tylenol [Acetaminophen] Hives and Itching    Prescriptions prior to admission  Medication Sig Dispense Refill Last Dose  . hydrochlorothiazide (HYDRODIURIL) 25 MG tablet Take 1 tablet (25 mg total) by mouth daily. (Patient not taking: Reported on 11/25/2014) 30 tablet 1 Not Taking at Unknown time  . methocarbamol (ROBAXIN) 500 MG tablet Take 1 tablet (500 mg total) by mouth 2 (two)  times daily. 20 tablet 0   . [DISCONTINUED] metroNIDAZOLE (FLAGYL) 500 MG tablet Take 1 tablet (500 mg total) by mouth 2 (two) times daily. (Patient not taking: Reported on 03/26/2015) 14 tablet 0 Not Taking at Unknown time    Review of Systems  Constitutional: Negative for fever and malaise/fatigue.  Gastrointestinal: Negative for nausea, vomiting, abdominal pain, diarrhea and constipation.  Genitourinary: Negative for dysuria, urgency and frequency.       + vaginal discharge ? HSV   Physical Exam   Blood pressure 154/101, pulse 72, temperature 98.7 F (37.1 C), temperature source Oral, resp. rate 20, height 5\' 6"  (1.676 m), weight 251 lb (113.853 kg), last menstrual period 10/25/2015, SpO2 100 %.  Physical Exam  Nursing note and vitals reviewed. Constitutional: She is oriented to person, place, and time. She appears  well-developed and well-nourished. No distress.  HENT:  Head: Normocephalic and atraumatic.  Cardiovascular: Normal rate.   Respiratory: Effort normal.  GI: Soft. She exhibits no distension and no mass. There is no tenderness. There is no rebound and no guarding.  Neurological: She is alert and oriented to person, place, and time.  Skin: Skin is warm and dry. No erythema.  Psychiatric: She has a normal mood and affect.    Results for orders placed or performed during the hospital encounter of 11/05/15 (from the past 24 hour(s))  Urinalysis, Routine w reflex microscopic (not at [facility])     Status: Abnormal   Collection Time: 11/05/15 10:57 PM  Result Value Ref Range   Color, Urine YELLOW YELLOW   APPearance CLEAR CLEAR   Specific Gravity, Urine >1.030 (H) 1.005 - 1.030   pH 6.0 5.0 - 8.0   Glucose, UA NEGATIVE NEGATIVE mg/dL   Hgb urine dipstick SMALL (A) NEGATIVE   Bilirubin Urine NEGATIVE NEGATIVE   Ketones, ur NEGATIVE NEGATIVE mg/dL   Protein, ur NEGATIVE NEGATIVE mg/dL   Nitrite NEGATIVE NEGATIVE   Leukocytes, UA NEGATIVE NEGATIVE  Urine microscopic-add on     Status: Abnormal   Collection Time: 11/05/15 10:57 PM  Result Value Ref Range   Squamous Epithelial / LPF 0-5 (A) NONE SEEN   WBC, UA NONE SEEN 0 - 5 WBC/hpf   RBC / HPF 0-5 0 - 5 RBC/hpf   Bacteria, UA RARE (A) NONE SEEN  Pregnancy, urine POC     Status: None   Collection Time: 11/05/15 11:09 PM  Result Value Ref Range   Preg Test, Ur NEGATIVE NEGATIVE  Wet prep, genital     Status: Abnormal   Collection Time: 11/06/15 12:30 AM  Result Value Ref Range   Yeast Wet Prep HPF POC NONE SEEN NONE SEEN   Trich, Wet Prep NONE SEEN NONE SEEN   Clue Cells Wet Prep HPF POC PRESENT (A) NONE SEEN   WBC, Wet Prep HPF POC MODERATE (A) NONE SEEN   Sperm NONE SEEN    Patient Vitals for the past 24 hrs:  BP Temp Temp src Pulse Resp SpO2 Height Weight  11/06/15 0108 (!) 154/101 mmHg - - 72 - - - -  11/06/15 0028 (!) 153/104  mmHg - - 73 20 - - -  11/05/15 2257 (!) 162/111 mmHg 98.7 F (37.1 C) Oral 77 18 100 % 5\' 6"  (1.676 m) 251 lb (113.853 kg)    MAU Course  Procedures None  MDM UPT - negative UA, wet prep, GC/Chlmaydia, HIV and RPR today  Patient is supposed to take Norvasc 5 mg daily for HTN, has not  taken in > 1 week. BP elevated significantly today. Will give dose of Norvasc and recheck BP prior to discharge.  Patient does endorse slight headache earlier, but none now and intermittent chest pain today as well, although with further questioning she states pain is fluttering in the epigastric area and not chest pain.  [id number] - 5 mg Norvasc given  Discussed with Dr. Nehemiah Settle. Agrees with plan for discharge at this time with Rx for Norvasc for ~ 2 weeks with understanding that patient needs to call PCP for refills and management  Assessment and Plan  A: CHTN Bacterial vaginosis HSV outbreak, recurrent  P: Discharge home Rx for Valtrex, Flagyl and Norvasc given to patient  Patient advised to call PCP at [facility] tomorrow for refill of Norvasc CVA/CHTN precautions discussed Patient advised to follow-up with PCP ASAP for chronic medical problem management Patient may return to MAU as needed or if her condition were to change or worsen   Luvenia Redden, PA-C  11/06/2015, 1:19 AM

## 2015-11-06 NOTE — Discharge Instructions (Signed)
Genital Herpes Genital herpes is a common sexually transmitted infection (STI) that is caused by a virus. The virus is spread from person to person through sexual contact. Infection can cause itching, blisters, and sores in the genital area or rectal area. This is called an outbreak. It affects both men and women. Genital herpes is particularly concerning for pregnant women because the virus can be passed to the baby during delivery and cause serious problems. Genital herpes is also a concern for people with a weakened defense (immune) system. Symptoms of genital herpes may last several days and then go away. However, the virus remains in your body, so you may have more outbreaks of symptoms in the future. The time between outbreaks varies and can be months or years. CAUSES Genital herpes is caused by a virus called herpes simplex virus (HSV) type 2 or HSV type 1. These viruses are contagious and are most often spread through sexual contact with an infected person. Sexual contact includes vaginal, anal, and oral sex. RISK FACTORS Risk factors for genital herpes include:  Being sexually active with multiple partners.  Having unprotected sex. SIGNS AND SYMPTOMS Symptoms may include:  Pain and itching in the genital area or rectal area.  Small red bumps that turn into blisters and then turn into sores.  Flu-like symptoms, including:  Fever.  Body aches.  Painful urination.  Vaginal discharge. DIAGNOSIS Genital herpes may be diagnosed by:  Physical exam.  Blood test.  Fluid culture test from an open sore. TREATMENT There is no cure for genital herpes. Oral antiviral medicines may be used to speed up healing and to help prevent the return of symptoms. These medicines can also help to reduce the spread of the virus to sexual partners. HOME CARE INSTRUCTIONS  Keep the affected areas dry and clean.  Take medicines only as directed by your health care provider.  Do not have sexual  contact during active infections. Genital herpes is contagious.  Practice safe sex. Latex condoms and female condoms may help to prevent the spread of the herpes virus.  Avoid rubbing or touching the blisters and sores. If you do touch the blister or sores:  Wash your hands thoroughly.  Do not touch your eyes afterward.  If you become pregnant, tell your health care provider if you have had genital herpes.  Keep all follow-up visits as directed by your health care provider. This is important. PREVENTION  Use condoms. Although anyone can contract genital herpes during sexual contact even with the use of a condom, a condom can provide some protection.  Avoid having multiple sexual partners.  Talk to your sexual partner about any symptoms and past history that either of you may have.  Get tested before you have sex. Ask your partner to do the same.  Recognize the symptoms of genital herpes. Do not have sexual contact if you notice these symptoms. SEEK MEDICAL CARE IF:  Your symptoms are not improving with medicine.  Your symptoms return.  You have new symptoms.  You have a fever.  You have abdominal pain.  You have redness, swelling, or pain in your eye. MAKE SURE YOU:  Understand these instructions.  Will watch your condition.  Will get help right away if you are not doing well or get worse.   This information is not intended to replace advice given to you by your health care provider. Make sure you discuss any questions you have with your health care provider.   Document Released: 05/23/2000  Document Revised: 06/16/2014 Document Reviewed: 10/11/2013 Elsevier Interactive Patient Education 2016 [employer]. Hypertension Hypertension is another name for high blood pressure. High blood pressure forces your heart to work harder to pump blood. A blood pressure reading has two numbers, which includes a higher number over a lower number (example: 110/72). HOME CARE    Have your blood pressure rechecked by your doctor.  Only take medicine as told by your doctor. Follow the directions carefully. The medicine does not work as well if you skip doses. Skipping doses also puts you at risk for problems.  Do not smoke.  Monitor your blood pressure at home as told by your doctor. GET HELP IF:  You think you are having a reaction to the medicine you are taking.  You have repeat headaches or feel dizzy.  You have puffiness (swelling) in your ankles.  You have trouble with your vision. GET HELP RIGHT AWAY IF:   You get a very bad headache and are confused.  You feel weak, numb, or faint.  You get chest or belly (abdominal) pain.  You throw up (vomit).  You cannot breathe very well. MAKE SURE YOU:   Understand these instructions.  Will watch your condition.  Will get help right away if you are not doing well or get worse.   This information is not intended to replace advice given to you by your health care provider. Make sure you discuss any questions you have with your health care provider.   Document Released: 11/12/2007 Document Revised: 05/31/2013 Document Reviewed: 03/18/2013 Elsevier Interactive Patient Education 2016 Elsevier Inc.  Bacterial Vaginosis Bacterial vaginosis is an infection of the vagina. It happens when too many germs (bacteria) grow in the vagina. Having this infection puts you at risk for getting other infections from sex. Treating this infection can help lower your risk for other infections, such as:   Chlamydia.  Gonorrhea.  HIV.  Herpes. HOME CARE  Take your medicine as told by your doctor.  Finish your medicine even if you start to feel better.  Tell your sex partner that you have an infection. They should see their doctor for treatment.  During treatment:  Avoid sex or use condoms correctly.  Do not douche.  Do not drink alcohol unless your doctor tells you it is ok.  Do not breastfeed unless  your doctor tells you it is ok. GET HELP IF:  You are not getting better after 3 days of treatment.  You have more grey fluid (discharge) coming from your vagina than before.  You have more pain than before.  You have a fever. MAKE SURE YOU:   Understand these instructions.  Will watch your condition.  Will get help right away if you are not doing well or get worse.   This information is not intended to replace advice given to you by your health care provider. Make sure you discuss any questions you have with your health care provider.   Document Released: 03/04/2008 Document Revised: 06/16/2014 Document Reviewed: 01/05/2013 Elsevier Interactive Patient Education [employer].

## 2015-11-07 LAB — GC/CHLAMYDIA PROBE AMP (~~LOC~~) NOT AT ARMC
Chlamydia: NEGATIVE
Neisseria Gonorrhea: NEGATIVE

## 2015-11-07 LAB — HIV ANTIBODY (ROUTINE TESTING W REFLEX): HIV Screen 4th Generation wRfx: NONREACTIVE

## 2015-11-07 LAB — RPR: RPR Ser Ql: NONREACTIVE

## 2016-06-08 ENCOUNTER — Inpatient Hospital Stay (HOSPITAL_COMMUNITY)
Admission: AD | Admit: 2016-06-08 | Discharge: 2016-06-08 | Disposition: A | Discharge status: Home / Self Care | Payer: Medicaid Other | Source: Ambulatory Visit | Attending: Obstetrics and Gynecology | Admitting: Obstetrics and Gynecology

## 2016-06-08 DIAGNOSIS — R198 Other specified symptoms and signs involving the digestive system and abdomen: Secondary | ICD-10-CM | POA: Insufficient documentation

## 2016-06-08 DIAGNOSIS — Z9851 Tubal ligation status: Secondary | ICD-10-CM | POA: Diagnosis not present

## 2016-06-08 NOTE — MAU Provider Note (Signed)
Patient was seen in MAU. She feels like she is pregnant but her home pregnancy test is negative. Her tubes are tied.  States that she feels "movement" in her uterus. She has an appointment with her primary care doctor on 07/03/2016. I provided reassurance that she is most likely feeling gas bubbles and encouraged her to keep her primary care appointment. Maye Hides CNM

## 2016-06-08 NOTE — MAU Note (Signed)
Maye Hides CNM in Triage to see pt and discuss d/c home. Pt d/c home from Triage

## 2016-06-08 NOTE — MAU Note (Addendum)
Feeling squiggly movements in lower abd, possibly in my uterus. Has had BTL. Denies vag d/c. No Pain. Did upt and was negative.

## 2018-10-07 ENCOUNTER — Encounter (HOSPITAL_COMMUNITY): Payer: Self-pay | Admitting: *Deleted

## 2018-10-07 ENCOUNTER — Other Ambulatory Visit: Payer: Self-pay | Admitting: Gastroenterology

## 2018-10-07 NOTE — Progress Notes (Signed)
Attempted to call patient x 3 with a busy signal each time.

## 2018-10-08 ENCOUNTER — Ambulatory Visit (HOSPITAL_COMMUNITY)
Admission: RE | Admit: 2018-10-08 | Discharge: 2018-10-08 | Disposition: A | Discharge status: Home / Self Care | Payer: Medicaid Other | Attending: Gastroenterology | Admitting: Gastroenterology

## 2018-10-08 ENCOUNTER — Ambulatory Visit (HOSPITAL_COMMUNITY): Payer: Medicaid Other | Admitting: Anesthesiology

## 2018-10-08 ENCOUNTER — Encounter (HOSPITAL_COMMUNITY): Payer: Self-pay | Admitting: *Deleted

## 2018-10-08 ENCOUNTER — Other Ambulatory Visit: Payer: Self-pay

## 2018-10-08 ENCOUNTER — Encounter (HOSPITAL_COMMUNITY)
Admission: RE | Disposition: A | Discharge status: Home / Self Care | Payer: Self-pay | Source: Home / Self Care | Attending: Gastroenterology

## 2018-10-08 DIAGNOSIS — G4730 Sleep apnea, unspecified: Secondary | ICD-10-CM | POA: Insufficient documentation

## 2018-10-08 DIAGNOSIS — R12 Heartburn: Secondary | ICD-10-CM | POA: Diagnosis present

## 2018-10-08 DIAGNOSIS — E669 Obesity, unspecified: Secondary | ICD-10-CM | POA: Insufficient documentation

## 2018-10-08 DIAGNOSIS — Z6841 Body Mass Index (BMI) 40.0 and over, adult: Secondary | ICD-10-CM | POA: Insufficient documentation

## 2018-10-08 DIAGNOSIS — Z8249 Family history of ischemic heart disease and other diseases of the circulatory system: Secondary | ICD-10-CM | POA: Diagnosis not present

## 2018-10-08 DIAGNOSIS — Z885 Allergy status to narcotic agent status: Secondary | ICD-10-CM | POA: Diagnosis not present

## 2018-10-08 DIAGNOSIS — R1310 Dysphagia, unspecified: Secondary | ICD-10-CM | POA: Diagnosis present

## 2018-10-08 DIAGNOSIS — K219 Gastro-esophageal reflux disease without esophagitis: Secondary | ICD-10-CM | POA: Diagnosis not present

## 2018-10-08 DIAGNOSIS — I10 Essential (primary) hypertension: Secondary | ICD-10-CM | POA: Diagnosis not present

## 2018-10-08 DIAGNOSIS — Z87891 Personal history of nicotine dependence: Secondary | ICD-10-CM | POA: Diagnosis not present

## 2018-10-08 HISTORY — PX: ESOPHAGOGASTRODUODENOSCOPY (EGD) WITH PROPOFOL: SHX5813

## 2018-10-08 LAB — PREGNANCY, URINE: Preg Test, Ur: NEGATIVE

## 2018-10-08 SURGERY — ESOPHAGOGASTRODUODENOSCOPY (EGD) WITH PROPOFOL
Anesthesia: General

## 2018-10-08 MED ORDER — LIDOCAINE 2% (20 MG/ML) 5 ML SYRINGE
INTRAMUSCULAR | Status: DC | PRN
  Administered 2018-10-08: 100 mg via INTRAVENOUS

## 2018-10-08 MED ORDER — SODIUM CHLORIDE 0.9 % IV SOLN: INTRAVENOUS | Status: DC

## 2018-10-08 MED ORDER — ESMOLOL HCL 100 MG/10ML IV SOLN
INTRAVENOUS | Status: DC | PRN
  Administered 2018-10-08 (×2): 40 mg via INTRAVENOUS

## 2018-10-08 MED ORDER — PROPOFOL 10 MG/ML IV BOLUS
INTRAVENOUS | Status: DC | PRN
  Administered 2018-10-08: 200 mg via INTRAVENOUS

## 2018-10-08 MED ORDER — LACTATED RINGERS IV SOLN
INTRAVENOUS | Status: DC
  Administered 2018-10-08: 1000 mL via INTRAVENOUS

## 2018-10-08 MED ORDER — SUCCINYLCHOLINE CHLORIDE 200 MG/10ML IV SOSY
PREFILLED_SYRINGE | INTRAVENOUS | Status: DC | PRN
  Administered 2018-10-08: 140 mg via INTRAVENOUS

## 2018-10-08 MED ORDER — PROPOFOL 10 MG/ML IV BOLUS
INTRAVENOUS | Status: AC
  Filled 2018-10-08: qty 20

## 2018-10-08 MED ORDER — MIDAZOLAM HCL 5 MG/5ML IJ SOLN
INTRAMUSCULAR | Status: DC | PRN
  Administered 2018-10-08: 2 mg via INTRAVENOUS

## 2018-10-08 SURGICAL SUPPLY — 14 items

## 2018-10-08 NOTE — H&P (Signed)
  [name] HPI: This is a 33 year old female with a PMH of GERD who reports a worsening of her symptoms along with dysphagia.  Past Medical History:  Diagnosis Date  . Abnormal Pap smear    colposcopy, mild dysplasia, HPV  . Acid reflux   . Anxiety    on meds- stable  . Bronchitis   . Chlamydia   . Depression    on meds for bipolar  . Fibroid   . Gonorrhea   . History of PID   . Hypertension   . Obesity   . Panic attack   . Pregnancy induced hypertension   . Sleep apnea    hasnt uses CPAP x 4 years  . Trichomonas   . Urinary tract infection     Past Surgical History:  Procedure Laterality Date  . ABDOMINAL SURGERY    . CESAREAN SECTION  05/02/03,05/04/08,10/23/10   x3  . CHOLECYSTECTOMY N/A 10/07/2012   Procedure: LAPAROSCOPIC CHOLECYSTECTOMY WITH INTRAOPERATIVE CHOLANGIOGRAM;  Surgeon: Imogene Burn. Georgette Dover, MD;  Location: [facility];  Service: General;  Laterality: N/A;  . FOOT SURGERY    . INSERTION OF MESH N/A 12/02/2012   Procedure: INSERTION OF MESH;  Surgeon: Adin Hector, MD;  Location: [facility];  Service: General;  Laterality: N/A;  . TUBAL LIGATION    . UMBILICAL HERNIA REPAIR N/A 12/02/2012   Procedure: LAPAROSCOPIC UMBILICAL HERNIA;  Surgeon: Adin Hector, MD;  Location: [facility];  Service: General;  Laterality: N/A;  . WISDOM TOOTH EXTRACTION      Family History  Problem Relation Age of Onset  . Hypertension Mother   . Heart disease Father   . Stroke Father   . Other Neg Hx     Social History:  reports that she has quit smoking. Her smoking use included cigarettes. She has a 0.25 pack-year smoking history. She has never used smokeless tobacco. She reports that she does not drink alcohol or use drugs.  Allergies:  Allergies  Allergen Reactions  . Percocet [Oxycodone-Acetaminophen] Hives and Rash    Medications:  Scheduled:  Continuous: . sodium chloride    . lactated ringers      Results for orders placed or performed during the hospital  encounter of 10/08/18 (from the past 24 hour(s))  Pregnancy, urine     Status: None   Collection Time: 10/08/18 10:50 AM  Result Value Ref Range   Preg Test, Ur NEGATIVE NEGATIVE     No results found.  ROS:  As stated above in the HPI otherwise negative.  Blood pressure (!) 154/99, pulse 94, temperature 98.7 F (37.1 C), temperature source Oral, resp. rate 15, height 5\' 5"  (1.651 m), weight 133.4 kg, SpO2 100 %.    PE: Gen: NAD, Alert and Oriented HEENT:  [place]/AT, EOMI Neck: Supple, no LAD Lungs: CTA Bilaterally CV: RRR without M/G/R ABM: Soft, NTND, +BS, morbidly obese Ext: No C/C/E  Assessment/Plan: 1) Dysphagia. 2) GERD.  Plan: 1) EGD.  Gaspare Netzel D 10/08/2018, 11:28 AM

## 2018-10-08 NOTE — Transfer of Care (Signed)
Immediate Anesthesia Transfer of Care Note  Patient: [name]  Procedure(s) Performed: ESOPHAGOGASTRODUODENOSCOPY (EGD) WITH PROPOFOL (N/A )  Patient Location: endo  Anesthesia Type:General  Level of Consciousness: awake, alert  and oriented  Airway & Oxygen Therapy: Patient Spontanous Breathing  Post-op Assessment: Report given to RN and Post -op Vital signs reviewed and stable  Post vital signs: Reviewed and stable  Last Vitals:  Vitals Value Taken Time  BP    Temp    Pulse    Resp    SpO2      Last Pain:  Vitals:   10/08/18 1116  TempSrc: Oral  PainSc: 0-No pain         Complications: No apparent anesthesia complications

## 2018-10-08 NOTE — Op Note (Signed)
[facility] Patient Name: [name] Procedure Date: 10/08/2018 MRN: [id number] Attending MD: Carol Ada , MD Date of Birth: 1985/07/29 CSN: [id number] Age: 33 Admit Type: Outpatient Procedure:                Upper GI endoscopy Indications:              Dysphagia, Heartburn Providers:                Carol Ada, MD, Cleda Daub, RN, Cherylynn Ridges,                            Technician, Stephanie [place] CRNA Referring MD:              Medicines:                General Anesthesia Complications:            No immediate complications. Estimated Blood Loss:     Estimated blood loss: none. Procedure:                Pre-Anesthesia Assessment:                           - Prior to the procedure, a History and Physical                            was performed, and patient medications and                            allergies were reviewed. The patient's tolerance of                            previous anesthesia was also reviewed. The risks                            and benefits of the procedure and the sedation                            options and risks were discussed with the patient.                            All questions were answered, and informed consent                            was obtained. Prior Anticoagulants: The patient has                            taken no previous anticoagulant or antiplatelet                            agents. ASA Grade Assessment: III - A patient with                            severe systemic disease. After reviewing the risks  and benefits, the patient was deemed in                            satisfactory condition to undergo the procedure.                           - Sedation was administered by an anesthesia                            professional. General anesthesia was attained.                           After obtaining informed consent, the endoscope was                            passed under  direct vision. Throughout the                            procedure, the patient's blood pressure, pulse, and                            oxygen saturations were monitored continuously. The                            GIF-H190 ([id number]) Olympus gastroscope was                            introduced through the mouth, and advanced to the                            third part of duodenum. The upper GI endoscopy was                            accomplished without difficulty. The patient                            tolerated the procedure well. Scope In: Scope Out: Findings:      The esophagus was normal.      A large amount of food (residue) was found in the gastric antrum. There       was no evidence of pyloric stenosis. (No clinical symptoms consistent       with gastroparesis.)      The examined duodenum was normal. Impression:               - Normal esophagus.                           - A large amount of food (residue) in the stomach.                           - Normal examined duodenum.                           - No specimens collected. Moderate Sedation:      Not Applicable - Patient had care per Anesthesia.  Recommendation:           - Patient has a contact number available for                            emergencies. The signs and symptoms of potential                            delayed complications were discussed with the                            patient. Return to normal activities tomorrow.                            Written discharge instructions were provided to the                            patient.                           - Resume previous diet.                           - Continue present medications.                           - Continue with PPI QD and follow up in the office                            in one year. Procedure Code(s):        --- Professional ---                           [phone], Esophagogastroduodenoscopy, flexible,                            transoral;  diagnostic, including collection of                            specimen(s) by brushing or washing, when performed                            (separate procedure) Diagnosis Code(s):        --- Professional ---                           R13.10, Dysphagia, unspecified                           R12, Heartburn CPT copyright 2019 [employer] Medical Association. All rights reserved. The codes documented in this report are preliminary and upon coder review may  be revised to meet current compliance requirements. Carol Ada, MD Carol Ada, MD 10/08/2018 12:22:26 PM This report has been signed electronically. Number of Addenda: 0

## 2018-10-08 NOTE — Anesthesia Procedure Notes (Signed)
Procedure Name: Intubation Date/Time: 10/08/2018 12:05 PM Performed by: [place] Acea Yagi C, CRNA Pre-anesthesia Checklist: Patient identified, Emergency Drugs available, Suction available and Patient being monitored Patient Re-evaluated:Patient Re-evaluated prior to induction Oxygen Delivery Method: Circle system utilized Preoxygenation: Pre-oxygenation with 100% oxygen Induction Type: IV induction and Rapid sequence Laryngoscope Size: Mac and 3 Grade View: Grade I Tube type: Oral Tube size: 7.0 mm Number of attempts: 1 Airway Equipment and Method: Stylet and Oral airway Placement Confirmation: ETT inserted through vocal cords under direct vision,  positive ETCO2 and breath sounds checked- equal and bilateral Tube secured with: Tape Dental Injury: Teeth and Oropharynx as per pre-operative assessment

## 2018-10-08 NOTE — Anesthesia Preprocedure Evaluation (Signed)

## 2018-10-08 NOTE — Anesthesia Postprocedure Evaluation (Signed)
Anesthesia Post Note  Patient: [name]  Procedure(s) Performed: ESOPHAGOGASTRODUODENOSCOPY (EGD) WITH PROPOFOL (N/A )     Patient location during evaluation: PACU Anesthesia Type: General Level of consciousness: awake and alert Pain management: pain level controlled Vital Signs Assessment: post-procedure vital signs reviewed and stable Respiratory status: spontaneous breathing, nonlabored ventilation, respiratory function stable and patient connected to nasal cannula oxygen Cardiovascular status: blood pressure returned to baseline and stable Postop Assessment: no apparent nausea or vomiting Anesthetic complications: no    Last Vitals:  Vitals:   10/08/18 1237 10/08/18 1240  BP: (!) 126/99 126/72  Pulse: (!) 105 (!) 105  Resp: 15 16  Temp:    SpO2: 100% 100%    Last Pain:  Vitals:   10/08/18 1227  TempSrc: Oral  PainSc: 0-No pain                 Mylee Falin COKER

## 2018-10-08 NOTE — Discharge Instructions (Signed)

## 2018-10-11 ENCOUNTER — Encounter (HOSPITAL_COMMUNITY): Payer: Self-pay | Admitting: Gastroenterology

## 2019-07-03 ENCOUNTER — Encounter: Payer: Self-pay | Admitting: Emergency Medicine

## 2019-07-03 ENCOUNTER — Other Ambulatory Visit: Payer: Self-pay

## 2019-07-03 ENCOUNTER — Ambulatory Visit
Admission: EM | Admit: 2019-07-03 | Discharge: 2019-07-03 | Disposition: A | Discharge status: Home / Self Care | Payer: Medicaid Other | Attending: Physician Assistant | Admitting: Physician Assistant

## 2019-07-03 DIAGNOSIS — R110 Nausea: Secondary | ICD-10-CM | POA: Diagnosis not present

## 2019-07-03 DIAGNOSIS — I10 Essential (primary) hypertension: Secondary | ICD-10-CM | POA: Diagnosis not present

## 2019-07-03 DIAGNOSIS — R197 Diarrhea, unspecified: Secondary | ICD-10-CM | POA: Diagnosis not present

## 2019-07-03 MED ORDER — DICYCLOMINE HCL 20 MG PO TABS: 20.0000 mg | ORAL_TABLET | Freq: Two times a day (BID) | ORAL | 0 refills | Status: DC

## 2019-07-03 MED ORDER — ONDANSETRON 4 MG PO TBDP: 4.0000 mg | ORAL_TABLET | Freq: Three times a day (TID) | ORAL | 0 refills | Status: DC | PRN

## 2019-07-03 NOTE — ED Triage Notes (Signed)
Pt here for diarrhea and nausea starting last night

## 2019-07-03 NOTE — Discharge Instructions (Signed)
Quarantine for the next 2-3 days, if develop other symptoms such as cough, fever, shortness of breath, loss of taste/smell, will need COVID testing. If no new symptoms, quarantine until symptoms resolve. Zofran for nausea and vomiting as needed. Bentyl for abdominal cramping. Keep hydrated, you urine should be clear to pale yellow in color. Bland diet, advance as tolerated. Monitor for any worsening of symptoms, nausea or vomiting not controlled by medication, worsening abdominal pain, fever, go to the emergency department for further evaluation needed.

## 2019-07-03 NOTE — ED Provider Notes (Signed)
[name]    CSN: [id number] Arrival date & time: 07/03/19  0948      History   Chief Complaint Chief Complaint  Patient presents with  . Diarrhea    HPI [name] is a 34 y.o. female.   34 year old female comes in for nausea, diarrhea, abdominal pain since last night. Has also had increased gas. Has had 10+ episodes of diarrhea. Denies melena. Has had a few times where she saw BRB when wiping. Denies BRB in toilet bowl. Abdominal pain is mostly to the LUQ, intermittent, worse with diarrhea. She had one self induced episode of vomiting, which helped abdominal pain as well. Denies URI symptoms such as cough, congestion, sore throat. Denies fever, chills, body aches. Denies shortness of breath, loss of taste/smell. No known sick contact. Patient has history of cholecystectomy, tubal ligation, hernia repair, c-sections. States this does occur when eating fatty foods post cholecystectomy, and feels that this is the same.   LMP 05/12/2019. Denies urinary symptoms or vaginal symptoms.      Past Medical History:  Diagnosis Date  . Abnormal Pap smear    colposcopy, mild dysplasia, HPV  . Acid reflux   . Anxiety    on meds- stable  . Bronchitis   . Chlamydia   . Depression    on meds for bipolar  . Fibroid   . Gonorrhea   . History of PID   . Hypertension   . Obesity   . Panic attack   . Pregnancy induced hypertension   . Sleep apnea    hasnt uses CPAP x 4 years  . Trichomonas   . Urinary tract infection     Patient Active Problem List   Diagnosis Date Noted  . Incarcerated incisional hernia s/p lap repair 12/02/2012 12/03/2012  . Hypertension   . Acid reflux   . Anxiety   . Obesity, Class III, BMI 40-49.9 (morbid obesity) ([place])   . Sleep apnea   . Chronic cholecystitis with calculus s/p lap chole [id number] 10/04/2012    Past Surgical History:  Procedure Laterality Date  . ABDOMINAL SURGERY    . CESAREAN SECTION  05/02/03,05/04/08,10/23/10   x3   . CHOLECYSTECTOMY N/A 10/07/2012   Procedure: LAPAROSCOPIC CHOLECYSTECTOMY WITH INTRAOPERATIVE CHOLANGIOGRAM;  Surgeon: Imogene Burn. Georgette Dover, MD;  Location: [facility];  Service: General;  Laterality: N/A;  . ESOPHAGOGASTRODUODENOSCOPY (EGD) WITH PROPOFOL N/A 10/08/2018   Procedure: ESOPHAGOGASTRODUODENOSCOPY (EGD) WITH PROPOFOL;  Surgeon: Carol Ada, MD;  Location: [facility];  Service: Endoscopy;  Laterality: N/A;  . FOOT SURGERY    . INSERTION OF MESH N/A 12/02/2012   Procedure: INSERTION OF MESH;  Surgeon: Adin Hector, MD;  Location: [facility];  Service: General;  Laterality: N/A;  . TUBAL LIGATION    . UMBILICAL HERNIA REPAIR N/A 12/02/2012   Procedure: LAPAROSCOPIC UMBILICAL HERNIA;  Surgeon: Adin Hector, MD;  Location: [facility];  Service: General;  Laterality: N/A;  . WISDOM TOOTH EXTRACTION      OB History    Gravida  3   Para  3   Term  3   Preterm      AB      Living  3     SAB      TAB      Ectopic      Multiple      Live Births  2            Home Medications    Prior  to Admission medications   Medication Sig Start Date End Date Taking? Authorizing Provider  amLODipine (NORVASC) 10 MG tablet Take 10 mg by mouth daily. 09/07/18   [provider]  dicyclomine (BENTYL) 20 MG tablet Take 1 tablet (20 mg total) by mouth 2 (two) times daily. 07/03/19   Tasia Catchings, Victoria Henshaw V, PA-C  Esomeprazole Magnesium (NEXIUM PO) Take 1 packet by mouth daily.    [provider]  ibuprofen (ADVIL) 200 MG tablet Take 400-600 mg by mouth every 6 (six) hours as needed for headache or moderate pain.    [provider]  nitroGLYCERIN (NITROSTAT) 0.4 MG SL tablet Place 1 tablet under the tongue as needed for chest pain. 08/05/18   [provider]  ondansetron (ZOFRAN ODT) 4 MG disintegrating tablet Take 1 tablet (4 mg total) by mouth every 8 (eight) hours as needed for nausea or vomiting. 07/03/19   Ok Edwards, PA-C    Family History Family History  Problem  Relation Age of Onset  . Hypertension Mother   . Heart disease Father   . Stroke Father   . Other Neg Hx     Social History Social History   Tobacco Use  . Smoking status: Former Smoker    Packs/day: 0.25    Years: 1.00    Pack years: 0.25    Types: Cigarettes  . Smokeless tobacco: Never Used  Substance Use Topics  . Alcohol use: No    Comment: socially  . Drug use: No     Allergies   Percocet [oxycodone-acetaminophen]   Review of Systems Review of Systems  Reason unable to perform ROS: See HPI as above.     Physical Exam Triage Vital Signs ED Triage Vitals  Enc Vitals Group     BP 07/03/19 1002 (!) 161/99     Pulse Rate 07/03/19 1002 97     Resp 07/03/19 1002 18     Temp 07/03/19 1002 98.8 F (37.1 C)     Temp Source 07/03/19 1002 Oral     SpO2 07/03/19 1002 97 %     Weight --      Height --      Head Circumference --      Peak Flow --      Pain Score 07/03/19 1004 5     Pain Loc --      Pain Edu? --      Excl. in [place]? --    No data found.  Updated Vital Signs BP (!) 161/99 (BP Location: Right Arm)   Pulse 97   Temp 98.8 F (37.1 C) (Oral)   Resp 18   SpO2 97%   Physical Exam Constitutional:      General: She is not in acute distress.    Appearance: She is well-developed. She is not ill-appearing, toxic-appearing or diaphoretic.  HENT:     Head: Normocephalic and atraumatic.  Eyes:     Conjunctiva/sclera: Conjunctivae normal.     Pupils: Pupils are equal, round, and reactive to light.  Cardiovascular:     Rate and Rhythm: Normal rate and regular rhythm.  Pulmonary:     Effort: Pulmonary effort is normal. No respiratory distress.     Comments: LCTAB Abdominal:     General: Bowel sounds are normal.     Palpations: Abdomen is soft.     Tenderness: There is no abdominal tenderness. There is no right CVA tenderness, left CVA tenderness, guarding or rebound.  Musculoskeletal:     Cervical  back: Normal range of motion and neck supple.   Skin:    General: Skin is warm and dry.  Neurological:     Mental Status: She is alert and oriented to person, place, and time.  Psychiatric:        Behavior: Behavior normal.        Judgment: Judgment normal.      UC Treatments / Results  Labs (all labs ordered are listed, but only abnormal results are displayed) Labs Reviewed - No data to display  EKG   Radiology No results found.  Procedures Procedures (including critical care time)  Medications Ordered in UC Medications - No data to display  Initial Impression / Assessment and Plan / UC Course  I have reviewed the triage vital signs and the nursing notes.  Pertinent labs & imaging results that were available during my care of the patient were reviewed by me and considered in my medical decision making (see chart for details).    Discussed with patient no alarming signs on exam. Patient without other COVID like symptoms, though onset <24 hrs. At this time, will defer testing and have patient quarantine for the next 2-3 days. If develop other symptoms, to return for COVID testing. If no new symptoms, patient can leave quarantine after current symptoms resolve. Zofran for nausea. Bentyl as directed. Push fluids. Bland diet, advance as tolerated. Return precautions given.  Final Clinical Impressions(s) / UC Diagnoses   Final diagnoses:  Nausea without vomiting  Diarrhea, unspecified type    ED Prescriptions    Medication Sig Dispense Auth. Provider   dicyclomine (BENTYL) 20 MG tablet Take 1 tablet (20 mg total) by mouth 2 (two) times daily. 20 tablet Sheralee Qazi V, PA-C   ondansetron (ZOFRAN ODT) 4 MG disintegrating tablet Take 1 tablet (4 mg total) by mouth every 8 (eight) hours as needed for nausea or vomiting. 20 tablet Ok Edwards, PA-C     PDMP not reviewed this encounter.   Ok Edwards, PA-C 07/03/19 1056

## 2019-10-01 ENCOUNTER — Emergency Department (HOSPITAL_COMMUNITY)
Admission: EM | Admit: 2019-10-01 | Discharge: 2019-10-01 | Disposition: A | Discharge status: Home / Self Care | Payer: Medicaid Other | Attending: Emergency Medicine | Admitting: Emergency Medicine

## 2019-10-01 ENCOUNTER — Ambulatory Visit
Admission: EM | Admit: 2019-10-01 | Discharge: 2019-10-01 | Disposition: A | Discharge status: Home / Self Care | Payer: Medicaid Other | Attending: Emergency Medicine | Admitting: Emergency Medicine

## 2019-10-01 ENCOUNTER — Encounter: Payer: Self-pay | Admitting: Emergency Medicine

## 2019-10-01 ENCOUNTER — Other Ambulatory Visit: Payer: Self-pay

## 2019-10-01 DIAGNOSIS — Z5321 Procedure and treatment not carried out due to patient leaving prior to being seen by health care provider: Secondary | ICD-10-CM | POA: Diagnosis not present

## 2019-10-01 DIAGNOSIS — N939 Abnormal uterine and vaginal bleeding, unspecified: Secondary | ICD-10-CM | POA: Diagnosis present

## 2019-10-01 DIAGNOSIS — N920 Excessive and frequent menstruation with regular cycle: Secondary | ICD-10-CM | POA: Insufficient documentation

## 2019-10-01 DIAGNOSIS — N3001 Acute cystitis with hematuria: Secondary | ICD-10-CM | POA: Diagnosis present

## 2019-10-01 LAB — POCT URINALYSIS DIP (MANUAL ENTRY)
Bilirubin, UA: NEGATIVE
Glucose, UA: NEGATIVE mg/dL
Ketones, POC UA: NEGATIVE mg/dL
Nitrite, UA: NEGATIVE
Protein Ur, POC: NEGATIVE mg/dL
Spec Grav, UA: 1.025 (ref 1.010–1.025)
Urobilinogen, UA: 0.2 E.U./dL
pH, UA: 7 (ref 5.0–8.0)

## 2019-10-01 LAB — POCT URINE PREGNANCY: Preg Test, Ur: NEGATIVE

## 2019-10-01 MED ORDER — CEPHALEXIN 500 MG PO CAPS: 500.0000 mg | ORAL_CAPSULE | Freq: Two times a day (BID) | ORAL | 0 refills | Status: AC

## 2019-10-01 NOTE — ED Triage Notes (Signed)
Patient reports back and abdominal pain last evening.  Pain was cramping and in waves.  Patient reports going to the bathroom and large blood clot came from vagina.  Patient had another episode of gushing blood yesterday evening and mentions blood clots.   The vaginal bleeding is nothing like her normal periods.  Patient has very light bleeding .  Patient reports prior to all this, she had right lower abdominal pain.  Prior to yesterday, lmp was 3/24.  Denies urinary symptoms

## 2019-10-01 NOTE — Discharge Instructions (Signed)
Take antibiotic twice daily with food. Urine culture is pending: We will call you if we need to change antibiotics. Return for worsening urinary symptoms, vaginal discharge, pelvic or abdominal pain, back pain, fever.

## 2019-10-01 NOTE — ED Triage Notes (Signed)
Pt c/o vag bleeding with pain at 5pm. Pain is less now

## 2019-10-01 NOTE — ED Provider Notes (Signed)
[name]    CSN: [id number] Arrival date & time:         History   Chief Complaint Chief Complaint  Patient presents with  . Abdominal Pain    HPI [name] is a 34 y.o. female with history of obesity, hypertension, fibroids presenting for lower abdominal pain, vaginal bleeding yesterday around 5.  States she took OTC analgesia with some relief, though the size of the clots were concerning to her.  Patient is currently sexually active with 1 female partner, not routinely using condoms.  Denying vaginal discharge, pelvic pain.  Denies abdominal pain today.  No change in bowel habit.  No urinary symptoms.   Past Medical History:  Diagnosis Date  . Abnormal Pap smear    colposcopy, mild dysplasia, HPV  . Acid reflux   . Anxiety    on meds- stable  . Bronchitis   . Chlamydia   . Depression    on meds for bipolar  . Fibroid   . Gonorrhea   . History of PID   . Hypertension   . Obesity   . Panic attack   . Pregnancy induced hypertension   . Sleep apnea    hasnt uses CPAP x 4 years  . Trichomonas   . Urinary tract infection     Patient Active Problem List   Diagnosis Date Noted  . Incarcerated incisional hernia s/p lap repair 12/02/2012 12/03/2012  . Hypertension   . Acid reflux   . Anxiety   . Obesity, Class III, BMI 40-49.9 (morbid obesity) ([place])   . Sleep apnea   . Chronic cholecystitis with calculus s/p lap chole [id number] 10/04/2012    Past Surgical History:  Procedure Laterality Date  . ABDOMINAL SURGERY    . CESAREAN SECTION  05/02/03,05/04/08,10/23/10   x3  . CHOLECYSTECTOMY N/A 10/07/2012   Procedure: LAPAROSCOPIC CHOLECYSTECTOMY WITH INTRAOPERATIVE CHOLANGIOGRAM;  Surgeon: Imogene Burn. Georgette Dover, MD;  Location: [facility];  Service: General;  Laterality: N/A;  . ESOPHAGOGASTRODUODENOSCOPY (EGD) WITH PROPOFOL N/A 10/08/2018   Procedure: ESOPHAGOGASTRODUODENOSCOPY (EGD) WITH PROPOFOL;  Surgeon: Carol Ada, MD;  Location: [facility];  Service:  Endoscopy;  Laterality: N/A;  . FOOT SURGERY    . INSERTION OF MESH N/A 12/02/2012   Procedure: INSERTION OF MESH;  Surgeon: Adin Hector, MD;  Location: [facility];  Service: General;  Laterality: N/A;  . TUBAL LIGATION    . UMBILICAL HERNIA REPAIR N/A 12/02/2012   Procedure: LAPAROSCOPIC UMBILICAL HERNIA;  Surgeon: Adin Hector, MD;  Location: [facility];  Service: General;  Laterality: N/A;  . WISDOM TOOTH EXTRACTION      OB History    Gravida  3   Para  3   Term  3   Preterm      AB      Living  3     SAB      TAB      Ectopic      Multiple      Live Births  2            Home Medications    Prior to Admission medications   Medication Sig Start Date End Date Taking? Authorizing Provider  amLODipine (NORVASC) 10 MG tablet Take 10 mg by mouth daily. 09/07/18  Yes [provider]  Esomeprazole Magnesium (NEXIUM PO) Take 1 packet by mouth daily.   Yes [provider]  cephALEXin (KEFLEX) 500 MG capsule Take 1 capsule (500 mg total) by mouth 2 (  two) times daily for 3 days. 10/01/19 10/04/19  Hall-Potvin, [place], PA-C  dicyclomine (BENTYL) 20 MG tablet Take 1 tablet (20 mg total) by mouth 2 (two) times daily. 07/03/19   Tasia Catchings, Amy V, PA-C  ibuprofen (ADVIL) 200 MG tablet Take 400-600 mg by mouth every 6 (six) hours as needed for headache or moderate pain.    [provider]  nitroGLYCERIN (NITROSTAT) 0.4 MG SL tablet Place 1 tablet under the tongue as needed for chest pain. 08/05/18   [provider]  ondansetron (ZOFRAN ODT) 4 MG disintegrating tablet Take 1 tablet (4 mg total) by mouth every 8 (eight) hours as needed for nausea or vomiting. 07/03/19   Ok Edwards, PA-C    Family History Family History  Problem Relation Age of Onset  . Hypertension Mother   . Heart disease Father   . Stroke Father   . Other Neg Hx     Social History Social History   Tobacco Use  . Smoking status: Former Smoker    Packs/day: 0.25    Years: 1.00     Pack years: 0.25    Types: Cigarettes  . Smokeless tobacco: Never Used  Substance Use Topics  . Alcohol use: No    Comment: socially  . Drug use: No     Allergies   Percocet [oxycodone-acetaminophen]   Review of Systems As per HPI   Physical Exam Triage Vital Signs ED Triage Vitals  Enc Vitals Group     BP      Pulse      Resp      Temp      Temp src      SpO2      Weight      Height      Head Circumference      Peak Flow      Pain Score      Pain Loc      Pain Edu?      Excl. in [place]?    No data found.  Updated Vital Signs BP (!) 148/97 (BP Location: Left Arm) Comment: Pt sts just took BP meds  Pulse 91   Temp 98.5 F (36.9 C) (Oral)   Resp 18   LMP 08/31/2019   SpO2 99%   Visual Acuity Right Eye Distance:   Left Eye Distance:   Bilateral Distance:    Right Eye Near:   Left Eye Near:    Bilateral Near:     Physical Exam Constitutional:      General: She is not in acute distress. HENT:     Head: Normocephalic and atraumatic.  Eyes:     General: No scleral icterus.    Pupils: Pupils are equal, round, and reactive to light.  Cardiovascular:     Rate and Rhythm: Normal rate.  Pulmonary:     Effort: Pulmonary effort is normal.  Abdominal:     General: Bowel sounds are normal.     Palpations: Abdomen is soft.     Tenderness: There is no abdominal tenderness.  Skin:    Coloration: Skin is not jaundiced or pale.  Neurological:     Mental Status: She is alert and oriented to person, place, and time.      UC Treatments / Results  Labs (all labs ordered are listed, but only abnormal results are displayed) Labs Reviewed  POCT URINALYSIS DIP (MANUAL ENTRY) - Abnormal; Notable for the following components:      Result Value   Blood,  UA large (*)    Leukocytes, UA Trace (*)    All other components within normal limits  POCT URINE PREGNANCY - Normal  URINE CULTURE    EKG   Radiology No results found.  Procedures Procedures  (including critical care time)  Medications Ordered in UC Medications - No data to display  Initial Impression / Assessment and Plan / UC Course  I have reviewed the triage vital signs and the nursing notes.  Pertinent labs & imaging results that were available during my care of the patient were reviewed by me and considered in my medical decision making (see chart for details).     Patient febrile, nontoxic in office today.  No pain today.  Likely clots related to fibroids: We will defer to patient's primary care for further evaluation/management thereof.  Patient is without systemic symptoms such as fatigue, weakness: Labs deferred.  Urine pregnancy negative, urine dipstick significant for trace leukocytes, large blood.  Culture pending.  Will start Keflex, discontinue if culture is negative.  Return precautions discussed, patient verbalized understanding and is agreeable to plan. Final Clinical Impressions(s) / UC Diagnoses   Final diagnoses:  Acute cystitis with hematuria  Menorrhagia with regular cycle     Discharge Instructions     Take antibiotic twice daily with food. Urine culture is pending: We will call you if we need to change antibiotics. Return for worsening urinary symptoms, vaginal discharge, pelvic or abdominal pain, back pain, fever.    ED Prescriptions    Medication Sig Dispense Auth. Provider   cephALEXin (KEFLEX) 500 MG capsule Take 1 capsule (500 mg total) by mouth 2 (two) times daily for 3 days. 6 capsule Hall-Potvin, [place], PA-C     PDMP not reviewed this encounter.   Hall-Potvin, [place], [place] 10/01/19 1338

## 2019-10-04 LAB — URINE CULTURE: Culture: NO GROWTH

## 2019-11-22 ENCOUNTER — Ambulatory Visit
Admission: EM | Admit: 2019-11-22 | Discharge: 2019-11-22 | Disposition: A | Discharge status: Home / Self Care | Payer: Medicaid Other | Attending: Emergency Medicine | Admitting: Emergency Medicine

## 2019-11-22 DIAGNOSIS — N6452 Nipple discharge: Secondary | ICD-10-CM | POA: Diagnosis not present

## 2019-11-22 LAB — POCT URINE PREGNANCY: Preg Test, Ur: NEGATIVE

## 2019-11-22 NOTE — ED Triage Notes (Signed)
Pt states had discharge from nipples during her breast exam today. States last pregnancy 2012 and has her tubes tied.

## 2019-11-22 NOTE — Discharge Instructions (Signed)
Important to monitor symptoms, follow-up with primary care for further evaluation. Return if discharge increases, your breast becomes tender, or you develop fever.

## 2019-11-22 NOTE — ED Provider Notes (Addendum)
[name]    CSN: [id number] Arrival date & time: 11/22/19  1037      History   Chief Complaint Chief Complaint  Patient presents with  . breast discharge    HPI [name] is a 34 y.o. female for left nipple discharge.  States she noticed this while doing a self breast exam this morning.  Denies pain, lumps, change in appearance of breast.  Is s/p BTL.  No headaches, abdominal pain, urinary symptoms.   Past Medical History:  Diagnosis Date  . Abnormal Pap smear    colposcopy, mild dysplasia, HPV  . Acid reflux   . Anxiety    on meds- stable  . Bronchitis   . Chlamydia   . Depression    on meds for bipolar  . Fibroid   . Gonorrhea   . History of PID   . Hypertension   . Obesity   . Panic attack   . Pregnancy induced hypertension   . Sleep apnea    hasnt uses CPAP x 4 years  . Trichomonas   . Urinary tract infection     Patient Active Problem List   Diagnosis Date Noted  . Incarcerated incisional hernia s/p lap repair 12/02/2012 12/03/2012  . Hypertension   . Acid reflux   . Anxiety   . Obesity, Class III, BMI 40-49.9 (morbid obesity) ([place])   . Sleep apnea   . Chronic cholecystitis with calculus s/p lap chole [id number] 10/04/2012    Past Surgical History:  Procedure Laterality Date  . ABDOMINAL SURGERY    . CESAREAN SECTION  05/02/03,05/04/08,10/23/10   x3  . CHOLECYSTECTOMY N/A 10/07/2012   Procedure: LAPAROSCOPIC CHOLECYSTECTOMY WITH INTRAOPERATIVE CHOLANGIOGRAM;  Surgeon: Imogene Burn. Georgette Dover, MD;  Location: [facility];  Service: General;  Laterality: N/A;  . ESOPHAGOGASTRODUODENOSCOPY (EGD) WITH PROPOFOL N/A 10/08/2018   Procedure: ESOPHAGOGASTRODUODENOSCOPY (EGD) WITH PROPOFOL;  Surgeon: Carol Ada, MD;  Location: [facility];  Service: Endoscopy;  Laterality: N/A;  . FOOT SURGERY    . INSERTION OF MESH N/A 12/02/2012   Procedure: INSERTION OF MESH;  Surgeon: Adin Hector, MD;  Location: [facility];  Service: General;  Laterality: N/A;  .  TUBAL LIGATION    . UMBILICAL HERNIA REPAIR N/A 12/02/2012   Procedure: LAPAROSCOPIC UMBILICAL HERNIA;  Surgeon: Adin Hector, MD;  Location: [facility];  Service: General;  Laterality: N/A;  . WISDOM TOOTH EXTRACTION      OB History    Gravida  3   Para  3   Term  3   Preterm      AB      Living  3     SAB      TAB      Ectopic      Multiple      Live Births  2            Home Medications    Prior to Admission medications   Medication Sig Start Date End Date Taking? Authorizing Provider  amLODipine (NORVASC) 10 MG tablet Take 10 mg by mouth daily. 09/07/18   [provider]  dicyclomine (BENTYL) 20 MG tablet Take 1 tablet (20 mg total) by mouth 2 (two) times daily. 07/03/19   Tasia Catchings, Amy V, PA-C  Esomeprazole Magnesium (NEXIUM PO) Take 1 packet by mouth daily.    [provider]  ibuprofen (ADVIL) 200 MG tablet Take 400-600 mg by mouth every 6 (six) hours as needed for headache or moderate pain.  [provider]  nitroGLYCERIN (NITROSTAT) 0.4 MG SL tablet Place 1 tablet under the tongue as needed for chest pain. 08/05/18   [provider]  ondansetron (ZOFRAN ODT) 4 MG disintegrating tablet Take 1 tablet (4 mg total) by mouth every 8 (eight) hours as needed for nausea or vomiting. 07/03/19   Ok Edwards, PA-C    Family History Family History  Problem Relation Age of Onset  . Hypertension Mother   . Heart disease Father   . Stroke Father   . Other Neg Hx     Social History Social History   Tobacco Use  . Smoking status: Former Smoker    Packs/day: 0.25    Years: 1.00    Pack years: 0.25    Types: Cigarettes  . Smokeless tobacco: Never Used  Substance Use Topics  . Alcohol use: No    Comment: socially  . Drug use: No     Allergies   Percocet [oxycodone-acetaminophen]   Review of Systems As per HPI   Physical Exam Triage Vital Signs ED Triage Vitals  Enc Vitals Group     BP 11/22/19 1045 (!) 147/98      Pulse Rate 11/22/19 1045 90     Resp 11/22/19 1045 18     Temp 11/22/19 1045 98.2 F (36.8 C)     Temp Source 11/22/19 1045 Oral     SpO2 11/22/19 1045 100 %     Weight --      Height --      Head Circumference --      Peak Flow --      Pain Score 11/22/19 1050 0     Pain Loc --      Pain Edu? --      Excl. in [place]? --    No data found.  Updated Vital Signs BP (!) 147/98 (BP Location: Left Arm)   Pulse 90   Temp 98.2 F (36.8 C) (Oral)   Resp 18   SpO2 100%   Visual Acuity Right Eye Distance:   Left Eye Distance:   Bilateral Distance:    Right Eye Near:   Left Eye Near:    Bilateral Near:     Physical Exam Constitutional:      General: She is not in acute distress. HENT:     Head: Normocephalic and atraumatic.  Eyes:     General: No scleral icterus.    Pupils: Pupils are equal, round, and reactive to light.  Cardiovascular:     Rate and Rhythm: Normal rate.  Pulmonary:     Effort: Pulmonary effort is normal.  Chest:     Chest wall: No mass or tenderness.     Breasts: Tanner Score is 5. Breasts are symmetrical.        Right: Normal.        Left: Nipple discharge present. No swelling, bleeding, inverted nipple, mass, skin change or tenderness.  Skin:    Coloration: Skin is not jaundiced or pale.  Neurological:     Mental Status: She is alert and oriented to person, place, and time.      UC Treatments / Results  Labs (all labs ordered are listed, but only abnormal results are displayed) Labs Reviewed  POCT URINE PREGNANCY    EKG   Radiology No results found.  Procedures Procedures (including critical care time)  Medications Ordered in UC Medications - No data to display  Initial Impression / Assessment and Plan / UC Course  I have reviewed the triage vital signs and the nursing notes.  Pertinent labs & imaging results that were available during my care of the patient were reviewed by me and considered in my medical decision making (see chart  for details).     Patient appears well in office.  Urine pregnancy negative.  Low concern for infectious process at this time.  Will patient follow-up with PCP for further evaluation of galactorrhea persists.  Return precautions discussed, patient verbalized understanding and is agreeable to plan. Final Clinical Impressions(s) / UC Diagnoses   Final diagnoses:  Nipple discharge in female     Discharge Instructions     Important to monitor symptoms, follow-up with primary care for further evaluation. Return if discharge increases, your breast becomes tender, or you develop fever.    ED Prescriptions    None     PDMP not reviewed this encounter.   Hall-Potvin, [place], PA-C 11/22/19 1132    Hall-Potvin, [place], [place] 11/22/19 1132

## 2020-01-19 ENCOUNTER — Encounter (HOSPITAL_COMMUNITY): Payer: Medicaid Other

## 2020-01-25 ENCOUNTER — Telehealth: Payer: Self-pay | Admitting: *Deleted

## 2020-01-25 ENCOUNTER — Other Ambulatory Visit: Payer: Self-pay

## 2020-01-25 ENCOUNTER — Ambulatory Visit (HOSPITAL_COMMUNITY)
Admission: RE | Admit: 2020-01-25 | Discharge: 2020-01-25 | Disposition: A | Discharge status: Home / Self Care | Payer: Medicaid Other | Source: Ambulatory Visit | Attending: Internal Medicine | Admitting: Internal Medicine

## 2020-01-25 ENCOUNTER — Encounter (HOSPITAL_COMMUNITY): Payer: Self-pay

## 2020-01-25 MED ORDER — SODIUM CHLORIDE 0.9 % IV SOLN: INTRAVENOUS | Status: DC | PRN

## 2020-01-25 MED ORDER — SODIUM CHLORIDE 0.9 % IV SOLN
510.0000 mg | Freq: Once | INTRAVENOUS | Status: DC
  Filled 2020-01-25: qty 17

## 2020-01-25 NOTE — Telephone Encounter (Signed)
Pt was late for appointment  Called and stated she was on her way.When she arrived Pt was check in once she got into the infusion side she asked the nurse some question and walked out and left without saying anything

## 2020-01-25 NOTE — Progress Notes (Signed)
Patient came late for the appointment. Got checked in at the infusion side, RN informed the patient of the duration and the 30 minutes post infusion observation. However patient told RN she still has some unanswered questions about the scheduled [place] for her referring physician. Patient called the physician's office, thereafter patient got up and left the infusion center.

## 2020-01-27 ENCOUNTER — Telehealth: Payer: Self-pay | Admitting: Hematology

## 2020-01-27 NOTE — Telephone Encounter (Signed)
Received an urgent hem referral from Dr. Melba Coon for IDA. [name] has been cld and scheduled to see Dr. Irene Limbo on 8/24 at 3pm. Pt aware to arrive 15 minutes early.

## 2020-01-31 ENCOUNTER — Inpatient Hospital Stay: Payer: Medicaid Other | Attending: Hematology | Admitting: Hematology

## 2020-02-01 ENCOUNTER — Telehealth: Payer: Self-pay | Admitting: Hematology

## 2020-02-01 NOTE — Telephone Encounter (Signed)
[name] has been cld and rescheduled to see Dr. Irene Limbo on 9/9 at 1pm.

## 2020-02-16 ENCOUNTER — Inpatient Hospital Stay: Payer: Medicaid Other | Admitting: Hematology

## 2020-02-16 ENCOUNTER — Telehealth: Payer: Self-pay | Admitting: Hematology

## 2020-02-16 NOTE — Telephone Encounter (Signed)
[name] cld to reschedule her new hem appt to 9/28 at 11am. She was unable to make today's appt due to car trouble.

## 2020-03-05 ENCOUNTER — Inpatient Hospital Stay (HOSPITAL_COMMUNITY)
Admission: AD | Admit: 2020-03-05 | Discharge: 2020-03-05 | Disposition: A | Discharge status: Home / Self Care | Payer: Medicaid Other | Attending: Obstetrics and Gynecology | Admitting: Obstetrics and Gynecology

## 2020-03-05 ENCOUNTER — Other Ambulatory Visit: Payer: Self-pay

## 2020-03-05 DIAGNOSIS — A599 Trichomoniasis, unspecified: Secondary | ICD-10-CM | POA: Insufficient documentation

## 2020-03-05 DIAGNOSIS — R109 Unspecified abdominal pain: Secondary | ICD-10-CM | POA: Insufficient documentation

## 2020-03-05 DIAGNOSIS — O99891 Other specified diseases and conditions complicating pregnancy: Secondary | ICD-10-CM

## 2020-03-05 DIAGNOSIS — O98313 Other infections with a predominantly sexual mode of transmission complicating pregnancy, third trimester: Secondary | ICD-10-CM

## 2020-03-05 DIAGNOSIS — A5901 Trichomonal vulvovaginitis: Secondary | ICD-10-CM | POA: Diagnosis not present

## 2020-03-05 DIAGNOSIS — Z3A36 36 weeks gestation of pregnancy: Secondary | ICD-10-CM

## 2020-03-05 LAB — URINALYSIS, ROUTINE W REFLEX MICROSCOPIC
Bilirubin Urine: NEGATIVE
Glucose, UA: NEGATIVE mg/dL
Ketones, ur: NEGATIVE mg/dL
Nitrite: NEGATIVE
Protein, ur: 30 mg/dL — AB
Specific Gravity, Urine: 1.029 (ref 1.005–1.030)
pH: 5 (ref 5.0–8.0)

## 2020-03-05 LAB — WET PREP, GENITAL
Clue Cells Wet Prep HPF POC: NONE SEEN
Sperm: NONE SEEN
Yeast Wet Prep HPF POC: NONE SEEN

## 2020-03-05 LAB — POCT PREGNANCY, URINE: Preg Test, Ur: NEGATIVE

## 2020-03-05 MED ORDER — METRONIDAZOLE 500 MG PO TABS
2000.0000 mg | ORAL_TABLET | Freq: Once | ORAL | Status: AC
  Administered 2020-03-05: 2000 mg via ORAL
  Filled 2020-03-05 (×2): qty 4

## 2020-03-05 NOTE — MAU Provider Note (Signed)
First Provider Initiated Contact with Patient 03/05/20 1937      S [name] is a 34 y.o. 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   Patient states she started her menses today and has had increasing worse pain particular to her left side since this morning.  She states she took 600mg  ibuprofen with some relief.    O BP (!) 144/82 (BP Location: Right Arm)   Pulse 85   Temp 98.9 F (37.2 C) (Oral)   Resp 18   Ht 5\' 5"  (1.651 m)   Wt 132.7 kg   LMP 03/04/2020   SpO2 100%   BMI 48.69 kg/m  Physical Exam Vitals reviewed.  Constitutional:      Appearance: She is well-developed.  HENT:     Head: Normocephalic and atraumatic.  Cardiovascular:     Rate and Rhythm: Normal rate.  Pulmonary:     Effort: Pulmonary effort is normal. No respiratory distress.  Abdominal:     General: Abdomen is protuberant.  Musculoskeletal:        General: Normal range of motion.     Cervical back: Normal range of motion.  Skin:    General: Skin is warm and dry.  Neurological:     Mental Status: She is alert and oriented to person, place, and time.  Psychiatric:        Mood and Affect: Mood normal.        Behavior: Behavior normal.        Thought Content: Thought content normal.    Results for orders placed or performed during the hospital encounter of 03/05/20 (from the past 24 hour(s))  Pregnancy, urine POC     Status: None   Collection Time: 03/05/20  5:29 PM  Result Value Ref Range   Preg Test, Ur NEGATIVE NEGATIVE  Urinalysis, Routine w reflex microscopic Urine, Clean Catch     Status: Abnormal   Collection Time: 03/05/20  5:38 PM  Result Value Ref Range   Color, Urine YELLOW YELLOW   APPearance CLOUDY (A) CLEAR   Specific Gravity, Urine 1.029 1.005 - 1.030   pH 5.0 5.0 - 8.0   Glucose, UA NEGATIVE NEGATIVE mg/dL   Hgb urine dipstick LARGE (A) NEGATIVE   Bilirubin Urine NEGATIVE NEGATIVE    Ketones, ur NEGATIVE NEGATIVE mg/dL   Protein, ur 30 (A) NEGATIVE mg/dL   Nitrite NEGATIVE NEGATIVE   Leukocytes,Ua MODERATE (A) NEGATIVE   RBC / HPF 11-20 0 - 5 RBC/hpf   WBC, UA 21-50 0 - 5 WBC/hpf   Bacteria, UA FEW (A) NONE SEEN   Squamous Epithelial / LPF 11-20 0 - 5   Mucus PRESENT   Wet prep, genital     Status: Abnormal   Collection Time: 03/05/20  5:40 PM   Specimen: Vaginal  Result Value Ref Range   Yeast Wet Prep HPF POC NONE SEEN NONE SEEN   Trich, Wet Prep PRESENT (A) NONE SEEN   Clue Cells Wet Prep HPF POC NONE SEEN NONE SEEN   WBC, Wet Prep HPF POC MODERATE (A) NONE SEEN   Sperm NONE SEEN      A 33 year old Abdominal Pain Trichomoniasis  P -Labs collected, via self swab, while patient in waiting room. -Patient informed of trich diagnosis. -Treatment offered and accepted today. Flagyl ordered.  -Patient agreeable to EPT: Rx for Maryjo [place] Dob 11/21/?? -Patient given precautions including abstaining from  sexual activity for at least 14 days after partner treatment.  -Encouraged to follow up with primary ob regarding abdominal pain and myomas. -Patient verbalized understanding and without questions or concerns.  -Discharge from MAU in stable condition.  -Warning signs for worsening condition that would warrant emergency follow-up discussed. -Patient may return to MAU as needed for pregnancy related complaints.  [name], [place] 03/05/2020 7:37 PM

## 2020-03-05 NOTE — Discharge Instructions (Signed)
Trichomoniasis Trichomoniasis is an STI (sexually transmitted infection) that can affect both women and men. In women, the outer area of the female genitalia (vulva) and the vagina are affected. In men, mainly the penis is affected, but the prostate and other reproductive organs can also be involved.  This condition can be treated with medicine. It often has no symptoms (is asymptomatic), especially in men. If not treated, trichomoniasis can last for months or years. What are the causes? This condition is caused by a parasite called Trichomonas vaginalis. Trichomoniasis most often spreads from person to person (is contagious) through sexual contact. What increases the risk? The following factors may make you more likely to develop this condition:  Having unprotected sex.  Having sex with a partner who has trichomoniasis.  Having multiple sexual partners.  Having had previous trichomoniasis infections or other STIs. What are the signs or symptoms? In women, symptoms of trichomoniasis include:  Abnormal vaginal discharge that is clear, white, gray, or yellow-green and foamy and has an unusual "fishy" odor.  Itching and irritation of the vagina and vulva.  Burning or pain during urination or sex.  Redness and swelling of the genitals. In men, symptoms of trichomoniasis include:  Penile discharge that may be foamy or contain pus.  Pain in the penis. This may happen only when urinating.  Itching or irritation inside the penis.  Burning after urination or ejaculation. How is this diagnosed? In women, this condition may be found during a routine Pap test or physical exam. It may be found in men during a routine physical exam. Your health care provider may do tests to help diagnose this infection, such as:  Urine tests (men and women).  The following in women: ? Testing the pH of the vagina. ? A vaginal swab test that checks for the Trichomonas vaginalis parasite. ? Testing vaginal  secretions. Your health care provider may test you for other STIs, including HIV (human immunodeficiency virus). How is this treated? This condition is treated with medicine taken by mouth (orally), such as metronidazole or tinidazole, to fight the infection. Your sexual partner(s) also need to be tested and treated.  If you are a woman and you plan to become pregnant or think you may be pregnant, tell your health care provider right away. Some medicines that are used to treat the infection should not be taken during pregnancy. Your health care provider may recommend over-the-counter medicines or creams to help relieve itching or irritation. You may be tested for infection again 3 months after treatment. Follow these instructions at home:  Take and use over-the-counter and prescription medicines, including creams, only as told by your health care provider.  Take your antibiotic medicine as told by your health care provider. Do not stop taking the antibiotic even if you start to feel better.  Do not have sex until 7-10 days after you finish your medicine, or until your health care provider approves. Ask your health care provider when you may start to have sex again.  (Women) Do not douche or wear tampons while you have the infection.  Discuss your infection with your sexual partner(s). Make sure that your partner gets tested and treated, if necessary.  Keep all follow-up visits as told by your health care provider. This is important. How is this prevented?   Use condoms every time you have sex. Using condoms correctly and consistently can help protect against STIs.  Avoid having multiple sexual partners.  Talk with your sexual partner about any   symptoms that either of you may have, as well as any history of STIs.  Get tested for STIs and STDs (sexually transmitted diseases) before you have sex. Ask your partner to do the same.  Do not have sexual contact if you have symptoms of  trichomoniasis or another STI. Contact a health care provider if:  You still have symptoms after you finish your medicine.  You develop pain in your abdomen.  You have pain when you urinate.  You have bleeding after sex.  You develop a rash.  You feel nauseous or you vomit.  You plan to become pregnant or think you may be pregnant. Summary  Trichomoniasis is an STI (sexually transmitted infection) that can affect both women and men.  This condition often has no symptoms (is asymptomatic), especially in men.  Without treatment, this condition can last for months or years.  You should not have sex until 7-10 days after you finish your medicine, or until your health care provider approves. Ask your health care provider when you may start to have sex again.  Discuss your infection with your sexual partner(s). Make sure that your partner gets tested and treated, if necessary. This information is not intended to replace advice given to you by your health care provider. Make sure you discuss any questions you have with your health care provider. Document Revised: 03/09/2018 Document Reviewed: 03/09/2018 Elsevier Patient Education  2020 [employer]  

## 2020-03-05 NOTE — MAU Note (Signed)
Urine in lab, no order

## 2020-03-05 NOTE — MAU Note (Signed)
Presents with c/o abdominal pain, thinks it's from her existing fibroids. Reports took Ibuprofen @ 1000 this morning.  Also c/o vaginal & anal  Pressure.  Currently on menstrual cycle.

## 2020-03-06 ENCOUNTER — Other Ambulatory Visit: Payer: Self-pay

## 2020-03-06 ENCOUNTER — Inpatient Hospital Stay: Payer: Medicaid Other

## 2020-03-06 ENCOUNTER — Telehealth: Payer: Self-pay | Admitting: Hematology

## 2020-03-06 ENCOUNTER — Inpatient Hospital Stay: Payer: Medicaid Other | Attending: Hematology | Admitting: Hematology

## 2020-03-06 VITALS — BP 135/94 | HR 76 | Temp 99.2°F | Resp 18 | Ht 65.0 in | Wt 294.6 lb

## 2020-03-06 DIAGNOSIS — E538 Deficiency of other specified B group vitamins: Secondary | ICD-10-CM | POA: Diagnosis not present

## 2020-03-06 DIAGNOSIS — I10 Essential (primary) hypertension: Secondary | ICD-10-CM | POA: Insufficient documentation

## 2020-03-06 DIAGNOSIS — D509 Iron deficiency anemia, unspecified: Secondary | ICD-10-CM | POA: Diagnosis not present

## 2020-03-06 DIAGNOSIS — R5383 Other fatigue: Secondary | ICD-10-CM | POA: Insufficient documentation

## 2020-03-06 DIAGNOSIS — E669 Obesity, unspecified: Secondary | ICD-10-CM | POA: Insufficient documentation

## 2020-03-06 DIAGNOSIS — Z87442 Personal history of urinary calculi: Secondary | ICD-10-CM | POA: Diagnosis not present

## 2020-03-06 DIAGNOSIS — R42 Dizziness and giddiness: Secondary | ICD-10-CM | POA: Insufficient documentation

## 2020-03-06 DIAGNOSIS — F5089 Other specified eating disorder: Secondary | ICD-10-CM | POA: Insufficient documentation

## 2020-03-06 DIAGNOSIS — Z87891 Personal history of nicotine dependence: Secondary | ICD-10-CM | POA: Insufficient documentation

## 2020-03-06 DIAGNOSIS — F419 Anxiety disorder, unspecified: Secondary | ICD-10-CM | POA: Insufficient documentation

## 2020-03-06 DIAGNOSIS — G4730 Sleep apnea, unspecified: Secondary | ICD-10-CM | POA: Insufficient documentation

## 2020-03-06 DIAGNOSIS — G2581 Restless legs syndrome: Secondary | ICD-10-CM | POA: Insufficient documentation

## 2020-03-06 DIAGNOSIS — Z79899 Other long term (current) drug therapy: Secondary | ICD-10-CM | POA: Insufficient documentation

## 2020-03-06 LAB — CBC WITH DIFFERENTIAL/PLATELET
Abs Immature Granulocytes: 0.03 10*3/uL (ref 0.00–0.07)
Basophils Absolute: 0 10*3/uL (ref 0.0–0.1)
Basophils Relative: 0 %
Eosinophils Absolute: 0.1 10*3/uL (ref 0.0–0.5)
Eosinophils Relative: 1 %
HCT: 28.6 % — ABNORMAL LOW (ref 36.0–46.0)
Hemoglobin: 8.5 g/dL — ABNORMAL LOW (ref 12.0–15.0)
Immature Granulocytes: 0 %
Lymphocytes Relative: 28 %
Lymphs Abs: 2.3 10*3/uL (ref 0.7–4.0)
MCH: 21.7 pg — ABNORMAL LOW (ref 26.0–34.0)
MCHC: 29.7 g/dL — ABNORMAL LOW (ref 30.0–36.0)
MCV: 73.1 fL — ABNORMAL LOW (ref 80.0–100.0)
Monocytes Absolute: 0.4 10*3/uL (ref 0.1–1.0)
Monocytes Relative: 5 %
Neutro Abs: 5.5 10*3/uL (ref 1.7–7.7)
Neutrophils Relative %: 66 %
Platelets: 414 10*3/uL — ABNORMAL HIGH (ref 150–400)
RBC: 3.91 MIL/uL (ref 3.87–5.11)
RDW: 17.7 % — ABNORMAL HIGH (ref 11.5–15.5)
WBC: 8.4 10*3/uL (ref 4.0–10.5)
nRBC: 0 % (ref 0.0–0.2)

## 2020-03-06 LAB — CMP (CANCER CENTER ONLY)
ALT: 12 U/L (ref 0–44)
AST: 12 U/L — ABNORMAL LOW (ref 15–41)
Albumin: 3.3 g/dL — ABNORMAL LOW (ref 3.5–5.0)
Alkaline Phosphatase: 56 U/L (ref 38–126)
Anion gap: 3 — ABNORMAL LOW (ref 5–15)
BUN: 9 mg/dL (ref 6–20)
CO2: 29 mmol/L (ref 22–32)
Calcium: 8.7 mg/dL — ABNORMAL LOW (ref 8.9–10.3)
Chloride: 108 mmol/L (ref 98–111)
Creatinine: 0.74 mg/dL (ref 0.44–1.00)
GFR, Est AFR Am: >60 mL/min (ref >60)
GFR, Estimated: >60 mL/min (ref >60)
Glucose, Bld: 91 mg/dL (ref 70–99)
Potassium: 3.3 mmol/L — ABNORMAL LOW (ref 3.5–5.1)
Sodium: 140 mmol/L (ref 135–145)
Total Bilirubin: 0.2 mg/dL — ABNORMAL LOW (ref 0.3–1.2)
Total Protein: 7.2 g/dL (ref 6.5–8.1)

## 2020-03-06 LAB — URINE CULTURE: Culture: <10000 — AB

## 2020-03-06 LAB — IRON AND TIBC
Iron: 19 ug/dL — ABNORMAL LOW (ref 41–142)
Saturation Ratios: 5 % — ABNORMAL LOW (ref 21–57)
TIBC: 395 ug/dL (ref 236–444)
UIBC: 376 ug/dL (ref 120–384)

## 2020-03-06 LAB — SAMPLE TO BLOOD BANK

## 2020-03-06 LAB — VITAMIN B12: Vitamin B-12: 202 pg/mL (ref 180–914)

## 2020-03-06 LAB — FERRITIN: Ferritin: <4 ng/mL — ABNORMAL LOW (ref 11–307)

## 2020-03-06 NOTE — Patient Instructions (Signed)
Thank you for choosing [facility] to provide your oncology and hematology care.   Should you have questions after your visit to the [facility] ([facility]), please contact this office at [phone] between 8:30 AM and 4:30 PM.  Voice mails left after 4:00 PM may not be returned until the following business day.  Calls received after 4:30 PM will be answered by an off-site Nurse Triage Line.    Prescription Refills:  Please have your pharmacy contact us directly for most prescription requests.  Contact the office directly for refills of narcotics (pain medications). Allow 48-72 hours for refills.  Appointments: Please contact the [facility] scheduling department [phone] for questions regarding [facility] appointment scheduling.  Contact the schedulers with any scheduling changes so that your appointment can be rescheduled in a timely manner.   Central Scheduling for [place] [phone] - Call to schedule procedures such as PET scans, CT scans, MRI, Ultrasound, etc.  To afford each patient quality time with our providers, please arrive 30 minutes before your scheduled appointment time.  If you arrive late for your appointment, you may be asked to reschedule.  We strive to give you quality time with our providers, and arriving late affects you and other patients whose appointments are after yours. If you are a no show for multiple scheduled visits, you may be dismissed from the clinic at the providers discretion.     Resources: [facility] Social Workers [phone] for additional information on assistance programs or assistance connecting with community support programs   [place] DSS  [phone]: Information regarding food stamps, Medicaid, and utility assistance [place] [employer] [place] 336-333-6589   [place] [employer]'s shared-ride transportation service for eligible riders who have a disability that prevents them from riding the fixed route bus.   Medicare  Rights Center 800-333-4114 Helps people with Medicare understand their rights and benefits, navigate the Medicare system, and secure the quality healthcare they deserve [employer] 800-227-2345 Assists patients locate various types of support and financial assistance Cancer Care: 1-800-813-HOPE (4673) Provides financial assistance, online support groups, medication/co-pay assistance.   Transportation Assistance for appointments at [facility]: Transportation Coordinator [phone]  Again, thank you for choosing [facility] for your care.       

## 2020-03-06 NOTE — Telephone Encounter (Signed)
Scheduled per los. Declined printout  

## 2020-03-06 NOTE — Progress Notes (Signed)
HEMATOLOGY/ONCOLOGY CONSULTATION NOTE  Date of Service: 03/06/2020  Patient Care Team: Placey, Audrea Muscat, NP as PCP - General  CHIEF COMPLAINTS/PURPOSE OF CONSULTATION:  IDA  HISTORY OF PRESENTING ILLNESS:  [name] is a wonderful 35 y.o. female who has been referred to [place] by Dr. Melba Coon for evaluation and management of iron deficiency anemia. The pt reports that she is doing well overall.   The pt reports that the first time that she was told that she was anemic was a few months ago. Her menstrual cycles typically last six days, with four of those being especially heavy. Pt was recently told that she has a fibroid and has a consultation for surgical resection. Pt was given PO Iron to take, but has not, as she has anxiety surrounding taking medications. Pt has been experiencing fatigue, lightheadedness, dizziness, ice cravings, and restless legs.   She has three children, ages ranging from 33 to 33. Pt has had a tubal ligation. Pt is controlling her HTN with medication and has recently restarted using her CPAP machine. She endorses improved energy levels when using her CPAP regularly. Pt was raised as a Jehovah's Witness and is very hesitant to receive blood products.   Pt had hives after taking Percocet. This has made her cautious about taking medications.  Most recent lab results (01/17/2020) of CBC is as follows: all values are WNL except for RBC at 3.75, Hgb at 8.3, HCT at 28.1, MCV at 75, MCH at 22.1, MCH at 29.5, RDW at 16.8.  On review of systems, pt reports dizziness, lightheadedness, ice cravings, restless legs, anxiety and denies abdominal pain, leg swelling and any other symptoms.   On PMHx the pt reports HTN, Sleep Apnea, C-section x3, Tubal Ligation, Anxiety. On Social Hx the pt reports that she is a non-smoker and does not drink much alcohol.   MEDICAL HISTORY:  Past Medical History:  Diagnosis Date  . Abnormal Pap smear    colposcopy, mild dysplasia, HPV  .  Acid reflux   . Anxiety    on meds- stable  . Bronchitis   . Chlamydia   . Depression    on meds for bipolar  . Fibroid   . Gonorrhea   . History of PID   . Hypertension   . Obesity   . Panic attack   . Pregnancy induced hypertension   . Sleep apnea    hasnt uses CPAP x 4 years  . Trichomonas   . Urinary tract infection     SURGICAL HISTORY: Past Surgical History:  Procedure Laterality Date  . ABDOMINAL SURGERY    . CESAREAN SECTION  05/02/03,05/04/08,10/23/10   x3  . CHOLECYSTECTOMY N/A 10/07/2012   Procedure: LAPAROSCOPIC CHOLECYSTECTOMY WITH INTRAOPERATIVE CHOLANGIOGRAM;  Surgeon: Imogene Burn. Georgette Dover, MD;  Location: [facility];  Service: General;  Laterality: N/A;  . ESOPHAGOGASTRODUODENOSCOPY (EGD) WITH PROPOFOL N/A 10/08/2018   Procedure: ESOPHAGOGASTRODUODENOSCOPY (EGD) WITH PROPOFOL;  Surgeon: Carol Ada, MD;  Location: [facility];  Service: Endoscopy;  Laterality: N/A;  . FOOT SURGERY    . INSERTION OF MESH N/A 12/02/2012   Procedure: INSERTION OF MESH;  Surgeon: Adin Hector, MD;  Location: [facility];  Service: General;  Laterality: N/A;  . TUBAL LIGATION    . UMBILICAL HERNIA REPAIR N/A 12/02/2012   Procedure: LAPAROSCOPIC UMBILICAL HERNIA;  Surgeon: Adin Hector, MD;  Location: [facility];  Service: General;  Laterality: N/A;  . WISDOM TOOTH EXTRACTION      SOCIAL HISTORY: Social  History   Socioeconomic History  . Marital status: Single    Spouse name: Not on file  . Number of children: Not on file  . Years of education: Not on file  . Highest education level: Not on file  Occupational History  . Not on file  Tobacco Use  . Smoking status: Former Smoker    Packs/day: 0.25    Years: 1.00    Pack years: 0.25    Types: Cigarettes  . Smokeless tobacco: Never Used  Substance and Sexual Activity  . Alcohol use: No    Comment: socially  . Drug use: No  . Sexual activity: Yes    Birth control/protection: Surgical    Comment: Last intercourse 2 nights ago.   Other Topics Concern  . Not on file  Social History Narrative  . Not on file   Social Determinants of Health   Financial Resource Strain:   . Difficulty of Paying Living Expenses: Not on file  Food Insecurity:   . Worried About Charity fundraiser in the Last Year: Not on file  . Ran Out of Food in the Last Year: Not on file  Transportation Needs:   . Lack of Transportation (Medical): Not on file  . Lack of Transportation (Non-Medical): Not on file  Physical Activity:   . Days of Exercise per Week: Not on file  . Minutes of Exercise per Session: Not on file  Stress:   . Feeling of Stress : Not on file  Social Connections:   . Frequency of Communication with Friends and Family: Not on file  . Frequency of Social Gatherings with Friends and Family: Not on file  . Attends Religious Services: Not on file  . Active Member of Clubs or Organizations: Not on file  . Attends Archivist Meetings: Not on file  . Marital Status: Not on file  Intimate Partner Violence:   . Fear of Current or Ex-Partner: Not on file  . Emotionally Abused: Not on file  . Physically Abused: Not on file  . Sexually Abused: Not on file    FAMILY HISTORY: Family History  Problem Relation Age of Onset  . Hypertension Mother   . Heart disease Father   . Stroke Father   . Other Neg Hx     ALLERGIES:  is allergic to percocet [oxycodone-acetaminophen].  MEDICATIONS:  Current Outpatient Medications  Medication Sig Dispense Refill  . amLODipine (NORVASC) 10 MG tablet Take 10 mg by mouth daily.    . ergocalciferol (VITAMIN D2) 1.25 MG (50000 UT) capsule Take by mouth.    Marland Kitchen ibuprofen (ADVIL) 200 MG tablet Take 400-600 mg by mouth every 6 (six) hours as needed for headache or moderate pain.    Marland Kitchen Olopatadine HCl 0.2 % SOLN Apply 1 drop to eye every morning.    Marland Kitchen omeprazole (PRILOSEC) 10 MG capsule Take 20 mg by mouth as needed.    . SRONYX 0.1-20 MG-MCG tablet Take 1 tablet by mouth daily.    Marland Kitchen  dicyclomine (BENTYL) 20 MG tablet Take 1 tablet (20 mg total) by mouth 2 (two) times daily. 20 tablet 0  . Esomeprazole Magnesium (NEXIUM PO) Take 1 packet by mouth daily.    . nitroGLYCERIN (NITROSTAT) 0.4 MG SL tablet Place 1 tablet under the tongue as needed for chest pain.    Marland Kitchen ondansetron (ZOFRAN ODT) 4 MG disintegrating tablet Take 1 tablet (4 mg total) by mouth every 8 (eight) hours as needed for nausea or vomiting.  20 tablet 0   No current facility-administered medications for this visit.    REVIEW OF SYSTEMS:    10 Point review of Systems was done is negative except as noted above.  PHYSICAL EXAMINATION: ECOG PERFORMANCE STATUS: 1 - Symptomatic but completely ambulatory  . Vitals:   03/06/20 1121  BP: (!) 135/94  Pulse: 76  Resp: 18  Temp: 99.2 F (37.3 C)  SpO2: 100%   Filed Weights   03/06/20 1121  Weight: 294 lb 9.6 oz (133.6 kg)   .Body mass index is 49.02 kg/m.  GENERAL:alert, in no acute distress and comfortable SKIN: no acute rashes, no significant lesions EYES: conjunctiva are pink and non-injected, sclera anicteric OROPHARYNX: MMM, no exudates, no oropharyngeal erythema or ulceration NECK: supple, no JVD LYMPH:  no palpable lymphadenopathy in the cervical, axillary or inguinal regions LUNGS: clear to auscultation b/l with normal respiratory effort HEART: regular rate & rhythm ABDOMEN:  normoactive bowel sounds , non tender, not distended. Extremity: no pedal edema PSYCH: alert & oriented x 3 with fluent speech NEURO: no focal motor/sensory deficits  LABORATORY DATA:  I have reviewed the data as listed  . CBC Latest Ref Rng & Units 03/06/2020 03/28/2015 03/26/2015  WBC 4.0 - 10.5 K/uL 8.4 9.4 11.2(H)  Hemoglobin 12.0 - 15.0 g/dL 8.5(L) 12.2 11.6(L)  Hematocrit 36 - 46 % 28.6(L) 36.3 34.8(L)  Platelets 150 - 400 K/uL 414(H) 272 315   . CBC    Component Value Date/Time   WBC 8.4 03/06/2020 1214   RBC 3.91 03/06/2020 1214   HGB 8.5 (L)  03/06/2020 1214   HCT 28.6 (L) 03/06/2020 1214   PLT 414 (H) 03/06/2020 1214   MCV 73.1 (L) 03/06/2020 1214   MCH 21.7 (L) 03/06/2020 1214   MCHC 29.7 (L) 03/06/2020 1214   RDW 17.7 (H) 03/06/2020 1214   LYMPHSABS 2.3 03/06/2020 1214   MONOABS 0.4 03/06/2020 1214   EOSABS 0.1 03/06/2020 1214   BASOSABS 0.0 03/06/2020 1214    . CMP Latest Ref Rng & Units 03/06/2020 03/28/2015 03/26/2015  Glucose 70 - 99 mg/dL 91 131(H) 98  BUN 6 - 20 mg/dL 9 8 6   Creatinine 0.44 - 1.00 mg/dL 0.74 0.72 0.63  Sodium 135 - 145 mmol/L 140 139 139  Potassium 3.5 - 5.1 mmol/L 3.3(L) 3.3(L) 3.5  Chloride 98 - 111 mmol/L 108 110 109  CO2 22 - 32 mmol/L 29 24 25   Calcium 8.9 - 10.3 mg/dL 8.7(L) 8.6(L) 8.9  Total Protein 6.5 - 8.1 g/dL 7.2 7.2 7.4  Total Bilirubin 0.3 - 1.2 mg/dL 0.2(L) 0.5 0.5  Alkaline Phos 38 - 126 U/L 56 42 45  AST 15 - 41 U/L 12(L) 17 15  ALT 0 - 44 U/L 12 11(L) 11(L)    Lab Results  Component Value Date   IRON 19 (L) 03/06/2020   TIBC 395 03/06/2020   IRONPCTSAT 5 (L) 03/06/2020   (Iron and TIBC)  Lab Results  Component Value Date   FERRITIN <4 (L) 03/06/2020     RADIOGRAPHIC STUDIES: I have personally reviewed the radiological images as listed and agreed with the findings in the report. No results found.  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 -Advised pt that PO Iron takes some time to build iron stores and correct anemia - this can take longer if there are ongoing menstrual losses.  -Advised pt that the newer preparations of IV Iron have a lower risk of allergic reaction. Would premedicate to further reduce risk.   -Pt was a previous Jehovah's witness and is uneasy about receiving blood products.  -Recommend pt f/u with Dr. Melba Coon for menorrhagia management. -recommended to minimize use of NSAIDS like ibuprofen -Will get labs today -Will get IV Injectafer weekly x2 - discussed risks and benefits and alternatives and obtained patients explicit consent to proceed with this. -Will see back in 2 months with labs  5) B12 deficiency B12 -- levels 202 -SL B12 1000 mcg daily  FOLLOW UP: Labs today IV Injectafer weekly x 2 doses ASAP RTC with Dr Irene Limbo with labs in 2 months  . Orders Placed This Encounter  Procedures  . CMP ([place] only)    Standing Status:   Future    Number of Occurrences:   1    Standing Expiration Date:   03/06/2021  . CBC with Differential/Platelet    Standing Status:   Future    Number of Occurrences:   1    Standing Expiration Date:   03/06/2021  . Ferritin    Standing Status:   Future    Number of Occurrences:   1    Standing Expiration Date:   03/06/2021  . Iron and TIBC    Standing Status:   Future    Number of Occurrences:   1    Standing Expiration Date:   03/06/2021  . Vitamin B12    Standing Status:   Future    Number of Occurrences:   1    Standing Expiration Date:   03/06/2021  . CBC with Differential/Platelet    Standing Status:   Future    Standing Expiration Date:   03/06/2021  . Ferritin    Standing Status:   Future    Standing Expiration Date:   03/06/2021  . Iron and TIBC    Standing Status:   Future    Standing Expiration Date:   03/06/2021  . Sample to Blood Bank    Standing Status:   Future    Number of Occurrences:   1    Standing Expiration Date:   03/06/2021    All of the patients questions were answered with apparent satisfaction. The patient knows to call the clinic with any problems, questions or concerns.  I spent 35 mins counseling the patient face to face. The total time spent in the appointment was 45 minutes and more than 50% was  on counseling and direct patient cares.    Sullivan Lone MD [place] AAHIVMS [facility] Hematology/Oncology Physician [facility]  (Office):       [phone] (Work cell):  [phone] (Fax):           [phone]  03/06/2020 4:56 PM  I, Yevette Edwards, am acting as a scribe for Dr. Sullivan Lone.   .I have reviewed the above documentation for accuracy and completeness, and I agree with the above. Brunetta Genera MD

## 2020-03-07 LAB — GC/CHLAMYDIA PROBE AMP (~~LOC~~) NOT AT ARMC
Chlamydia: NEGATIVE
Comment: NEGATIVE
Comment: NORMAL
Neisseria Gonorrhea: NEGATIVE

## 2020-03-12 MED ORDER — B-12 1000 MCG SL SUBL: 1000.0000 ug | SUBLINGUAL_TABLET | Freq: Every day | SUBLINGUAL | 5 refills | Status: AC

## 2020-03-14 ENCOUNTER — Telehealth: Payer: Self-pay | Admitting: *Deleted

## 2020-03-14 NOTE — Telephone Encounter (Signed)
Contacted patient regarding test results per Dr. Grier Mitts directions in previous message. Patient verbalized understanding of information and directions.

## 2020-03-14 NOTE — Telephone Encounter (Signed)
-----   Message from Brunetta Genera, MD sent at 03/12/2020  2:26 PM EDT ----- Plz let patient know iron levels very low-- IV Iron as planned. In addition B12 was low -- recommend OTC Sublingual B12 1000 mcg daily and B complex 1 capo po daily OTC.

## 2020-03-16 ENCOUNTER — Other Ambulatory Visit: Payer: Self-pay

## 2020-03-16 ENCOUNTER — Inpatient Hospital Stay: Payer: Medicaid Other | Attending: Hematology

## 2020-03-16 VITALS — BP 137/85 | HR 77 | Temp 98.6°F | Resp 18 | Wt 290.5 lb

## 2020-03-16 DIAGNOSIS — Z8249 Family history of ischemic heart disease and other diseases of the circulatory system: Secondary | ICD-10-CM | POA: Insufficient documentation

## 2020-03-16 DIAGNOSIS — E538 Deficiency of other specified B group vitamins: Secondary | ICD-10-CM | POA: Diagnosis not present

## 2020-03-16 DIAGNOSIS — Z87891 Personal history of nicotine dependence: Secondary | ICD-10-CM | POA: Diagnosis not present

## 2020-03-16 DIAGNOSIS — D509 Iron deficiency anemia, unspecified: Secondary | ICD-10-CM

## 2020-03-16 DIAGNOSIS — Z79899 Other long term (current) drug therapy: Secondary | ICD-10-CM | POA: Diagnosis not present

## 2020-03-16 DIAGNOSIS — N920 Excessive and frequent menstruation with regular cycle: Secondary | ICD-10-CM | POA: Diagnosis present

## 2020-03-16 DIAGNOSIS — D500 Iron deficiency anemia secondary to blood loss (chronic): Secondary | ICD-10-CM | POA: Diagnosis not present

## 2020-03-16 DIAGNOSIS — Z823 Family history of stroke: Secondary | ICD-10-CM | POA: Diagnosis not present

## 2020-03-16 MED ORDER — SODIUM CHLORIDE 0.9 % IV SOLN
750.0000 mg | Freq: Once | INTRAVENOUS | Status: AC
  Administered 2020-03-16: 750 mg via INTRAVENOUS
  Filled 2020-03-16: qty 15

## 2020-03-16 MED ORDER — DIPHENHYDRAMINE HCL 25 MG PO CAPS
ORAL_CAPSULE | ORAL | Status: AC
  Filled 2020-03-16: qty 1

## 2020-03-16 MED ORDER — FAMOTIDINE IN NACL 20-0.9 MG/50ML-% IV SOLN
INTRAVENOUS | Status: AC
  Filled 2020-03-16: qty 50

## 2020-03-16 MED ORDER — DIPHENHYDRAMINE HCL 25 MG PO CAPS
25.0000 mg | ORAL_CAPSULE | Freq: Once | ORAL | Status: AC
  Administered 2020-03-16: 25 mg via ORAL

## 2020-03-16 MED ORDER — SODIUM CHLORIDE 0.9 % IV SOLN
Freq: Once | INTRAVENOUS | Status: AC
  Filled 2020-03-16: qty 250

## 2020-03-16 MED ORDER — FAMOTIDINE IN NACL 20-0.9 MG/50ML-% IV SOLN
20.0000 mg | Freq: Once | INTRAVENOUS | Status: AC
  Administered 2020-03-16: 20 mg via INTRAVENOUS

## 2020-03-16 NOTE — Patient Instructions (Signed)

## 2020-03-23 ENCOUNTER — Inpatient Hospital Stay: Payer: Medicaid Other

## 2020-03-23 ENCOUNTER — Other Ambulatory Visit: Payer: Self-pay

## 2020-03-23 VITALS — BP 113/82 | HR 80 | Temp 98.7°F | Resp 18

## 2020-03-23 DIAGNOSIS — D509 Iron deficiency anemia, unspecified: Secondary | ICD-10-CM

## 2020-03-23 DIAGNOSIS — D500 Iron deficiency anemia secondary to blood loss (chronic): Secondary | ICD-10-CM | POA: Diagnosis not present

## 2020-03-23 MED ORDER — FAMOTIDINE IN NACL 20-0.9 MG/50ML-% IV SOLN
INTRAVENOUS | Status: AC
  Filled 2020-03-23: qty 50

## 2020-03-23 MED ORDER — DIPHENHYDRAMINE HCL 25 MG PO CAPS
ORAL_CAPSULE | ORAL | Status: AC
  Filled 2020-03-23: qty 1

## 2020-03-23 MED ORDER — FAMOTIDINE IN NACL 20-0.9 MG/50ML-% IV SOLN
20.0000 mg | Freq: Once | INTRAVENOUS | Status: AC
  Administered 2020-03-23: 20 mg via INTRAVENOUS

## 2020-03-23 MED ORDER — SODIUM CHLORIDE 0.9 % IV SOLN
750.0000 mg | Freq: Once | INTRAVENOUS | Status: AC
  Administered 2020-03-23: 750 mg via INTRAVENOUS
  Filled 2020-03-23: qty 15

## 2020-03-23 MED ORDER — SODIUM CHLORIDE 0.9 % IV SOLN
Freq: Once | INTRAVENOUS | Status: AC
  Filled 2020-03-23: qty 250

## 2020-03-23 MED ORDER — DIPHENHYDRAMINE HCL 25 MG PO CAPS
25.0000 mg | ORAL_CAPSULE | Freq: Once | ORAL | Status: AC
  Administered 2020-03-23: 25 mg via ORAL

## 2020-03-23 NOTE — Progress Notes (Signed)
Pt remained for entire post infusion observation period of 30 min.  Tolerated well.  VS stable.

## 2020-03-23 NOTE — Patient Instructions (Signed)

## 2020-04-09 DIAGNOSIS — U071 COVID-19: Secondary | ICD-10-CM

## 2020-04-09 HISTORY — DX: COVID-19: U07.1

## 2020-04-13 ENCOUNTER — Telehealth: Payer: Self-pay | Admitting: Family

## 2020-04-13 ENCOUNTER — Ambulatory Visit
Admission: EM | Admit: 2020-04-13 | Discharge: 2020-04-13 | Disposition: A | Discharge status: Home / Self Care | Payer: Medicaid Other | Attending: Family Medicine | Admitting: Family Medicine

## 2020-04-13 ENCOUNTER — Other Ambulatory Visit: Payer: Self-pay

## 2020-04-13 DIAGNOSIS — R69 Illness, unspecified: Secondary | ICD-10-CM

## 2020-04-13 DIAGNOSIS — R059 Cough, unspecified: Secondary | ICD-10-CM

## 2020-04-13 DIAGNOSIS — R509 Fever, unspecified: Secondary | ICD-10-CM

## 2020-04-13 DIAGNOSIS — Z20822 Contact with and (suspected) exposure to covid-19: Secondary | ICD-10-CM | POA: Diagnosis not present

## 2020-04-13 MED ORDER — IPRATROPIUM BROMIDE 0.03 % NA SOLN: 2.0000 | Freq: Two times a day (BID) | NASAL | 0 refills | Status: DC

## 2020-04-13 MED ORDER — PSEUDOEPH-BROMPHEN-DM 30-2-10 MG/5ML PO SYRP
5.0000 mL | ORAL_SOLUTION | Freq: Four times a day (QID) | ORAL | 0 refills | Status: DC | PRN
Start: 2020-04-13 — End: 2020-07-03

## 2020-04-13 NOTE — Telephone Encounter (Signed)
Called to discuss with [name] about Covid symptoms and potential candidacy for the use of sotrovimab, a combination monoclonal antibody infusion for those with mild to moderate Covid symptoms and at a high risk of hospitalization.     Pt is qualified for this infusion at the infusion center due to co-morbid conditions and/or a member of an at-risk group, however unable to reach patient. VM left.   Loretha Ure,NP

## 2020-04-13 NOTE — ED Triage Notes (Signed)
Pt c/o cough, post nasal drip, nasal congestion, and low grade temp x2 days. States had a positive OTC rapid covid test. Pt requesting PCR. Pt c/o fatigue and sinus pressure.

## 2020-04-13 NOTE — ED Provider Notes (Signed)
[name]    CSN: [id number] Arrival date & time: 04/13/20  1437      History   Chief Complaint Chief Complaint  Patient presents with  . covid positive - cough/fever    HPI [name] is a 34 y.o. female.   HPI   Symptoms present presents with a history of 3 days of fever and cough along with nasal congestion. Patient reports the cough is persistent and has been unresponsive to OTC medication. Patient took a home Covid test today and it resulted as positive. She also was febrile on arrival and endorses a fever persistently over the last 3 days.  Past Medical History:  Diagnosis Date  . Abnormal Pap smear    colposcopy, mild dysplasia, HPV  . Acid reflux   . Anxiety    on meds- stable  . Bronchitis   . Chlamydia   . Depression    on meds for bipolar  . Fibroid   . Gonorrhea   . History of PID   . Hypertension   . Obesity   . Panic attack   . Pregnancy induced hypertension   . Sleep apnea    hasnt uses CPAP x 4 years  . Trichomonas   . Urinary tract infection     Patient Active Problem List   Diagnosis Date Noted  . Iron deficiency anemia 03/06/2020  . Incarcerated incisional hernia s/p lap repair 12/02/2012 12/03/2012  . Hypertension   . Acid reflux   . Anxiety   . Obesity, Class III, BMI 40-49.9 (morbid obesity) ([place])   . Sleep apnea   . Chronic cholecystitis with calculus s/p lap chole [id number] 10/04/2012    Past Surgical History:  Procedure Laterality Date  . ABDOMINAL SURGERY    . CESAREAN SECTION  05/02/03,05/04/08,10/23/10   x3  . CHOLECYSTECTOMY N/A 10/07/2012   Procedure: LAPAROSCOPIC CHOLECYSTECTOMY WITH INTRAOPERATIVE CHOLANGIOGRAM;  Surgeon: Imogene Burn. Georgette Dover, MD;  Location: [facility];  Service: General;  Laterality: N/A;  . ESOPHAGOGASTRODUODENOSCOPY (EGD) WITH PROPOFOL N/A 10/08/2018   Procedure: ESOPHAGOGASTRODUODENOSCOPY (EGD) WITH PROPOFOL;  Surgeon: Carol Ada, MD;  [facility];  Service: Endoscopy;   Laterality: N/A;  . FOOT SURGERY    . INSERTION OF MESH N/A 12/02/2012   Procedure: INSERTION OF MESH;  Surgeon: Adin Hector, MD;  Location: [facility];  Service: General;  Laterality: N/A;  . TUBAL LIGATION    . UMBILICAL HERNIA REPAIR N/A 12/02/2012   Procedure: LAPAROSCOPIC UMBILICAL HERNIA;  Surgeon: Adin Hector, MD;  [facility];  Service: General;  Laterality: N/A;  . WISDOM TOOTH EXTRACTION      OB History    Gravida  3   Para  3   Term  3   Preterm      AB      Living  3     SAB      TAB      Ectopic      Multiple      Live Births  2            Home Medications    Prior to Admission medications   Medication Sig Start Date End Date Taking? Authorizing Provider  amLODipine (NORVASC) 10 MG tablet Take 10 mg by mouth daily. 09/07/18   [provider]  Cyanocobalamin (B-12) 1000 MCG SUBL Place 1,000 mcg under the tongue daily. 03/12/20   Brunetta Genera, MD  ergocalciferol (VITAMIN D2) 1.25 MG (50000 UT) capsule Take by mouth.  [provider]  ibuprofen (ADVIL) 200 MG tablet Take 400-600 mg by mouth every 6 (six) hours as needed for headache or moderate pain.    [provider]  Olopatadine HCl 0.2 % SOLN Apply 1 drop to eye every morning. 01/11/20   [provider]  omeprazole (PRILOSEC) 10 MG capsule Take 20 mg by mouth as needed.    [provider]    Family History Family History  Problem Relation Age of Onset  . Hypertension Mother   . Heart disease Father   . Stroke Father   . Other Neg Hx     Social History Social History   Tobacco Use  . Smoking status: Former Smoker    Packs/day: 0.25    Years: 1.00    Pack years: 0.25    Types: Cigarettes  . Smokeless tobacco: Never Used  Substance Use Topics  . Alcohol use: No    Comment: socially  . Drug use: No     Allergies   Percocet [oxycodone-acetaminophen]   Review of Systems Review of Systems Pertinent negatives listed in  HPI   Physical Exam Triage Vital Signs ED Triage Vitals  Enc Vitals Group     BP 04/13/20 1454 (!) 153/87     Pulse Rate 04/13/20 1454 93     Resp 04/13/20 1454 18     Temp 04/13/20 1454 (!) 100.7 F (38.2 C)     Temp Source 04/13/20 1454 Oral     SpO2 04/13/20 1454 96 %     Weight --      Height --      Head Circumference --      Peak Flow --      Pain Score 04/13/20 1455 1     Pain Loc --      Pain Edu? --      Excl. in [place]? --    No data found.  Updated Vital Signs BP (!) 153/87 (BP Location: Left Arm)   Pulse 93   Temp (!) 100.7 F (38.2 C) (Oral)   Resp 18   LMP 04/05/2020   SpO2 96%   Visual Acuity Right Eye Distance:   Left Eye Distance:   Bilateral Distance:    Right Eye Near:   Left Eye Near:    Bilateral Near:     Physical Exam Constitutional:      Appearance: She is obese. She is ill-appearing.  Cardiovascular:     Rate and Rhythm: Normal rate and regular rhythm.  Pulmonary:     Effort: Pulmonary effort is normal.     Breath sounds: Normal breath sounds. No wheezing or rhonchi.  Musculoskeletal:     Cervical back: Normal range of motion.  Skin:    General: Skin is warm.     Capillary Refill: Capillary refill takes less than 2 seconds.  Neurological:     General: No focal deficit present.     Mental Status: She is oriented to person, place, and time.  Psychiatric:        Mood and Affect: Mood normal.        Behavior: Behavior normal.        Thought Content: Thought content normal.        Judgment: Judgment normal.      UC Treatments / Results  Labs (all labs ordered are listed, but only abnormal results are displayed) Labs Reviewed - No data to display  EKG   Radiology No results found.  Procedures Procedures (including  critical care time)  Medications Ordered in UC Medications - No data to display  Initial Impression / Assessment and Plan / UC Course  I have reviewed the triage vital signs and the nursing  notes.  Pertinent labs & imaging results that were available during my care of the patient were reviewed by me and considered in my medical decision making (see chart for details).    Home positive COVID-19 test result patient likely is infected with COVID-19. Confirmatory PCR pending. Discussed that supportive treatment is indicated. Given patient's BMI she is high risk for complications related to Covid therefore will refer to the infusion center. Red flags discussed with patient that indicate immediate need to go to the ER. Medications prescribed per discharge instructions. Final Clinical Impressions(s) / UC Diagnoses   Final diagnoses:  Suspected COVID-19 virus infection  Cough  Fever, unspecified fever cause     Discharge Instructions     Given that you have had a home positive Covid test recommend quarantining until the test we collected today results if positive you need to quarantine for total of 10 days from the onset of your symptoms.   You have been referred to the COVID infusion center.   Someone from the team will give you a call and give you more information regarding the benefits of getting the infusion. If you develop any symptoms of shortness of breath, chest pressure, or the inability to keep fluids go immediately to the ER for further evaluation. Continue to hydrate well with water.  Recommendation for supportive treatment for  viral illness include: Vitamin D 5,000 IU daily Vitamin C 500 mg twice daily Zinc 50 mg daily     ED Prescriptions    Medication Sig Dispense Auth. Provider   brompheniramine-pseudoephedrine-DM 30-2-10 MG/5ML syrup Take 5 mLs by mouth 4 (four) times daily as needed. 140 mL Scot Jun, FNP   ipratropium (ATROVENT) 0.03 % nasal spray Place 2 sprays into both nostrils 2 (two) times daily. 30 mL Scot Jun, FNP     PDMP not reviewed this encounter.   Scot Jun, FNP 04/13/20 1757

## 2020-04-13 NOTE — Discharge Instructions (Addendum)
Given that you have had a home positive Covid test recommend quarantining until the test we collected today results if positive you need to quarantine for total of 10 days from the onset of your symptoms.   You have been referred to the COVID infusion center.   Someone from the team will give you a call and give you more information regarding the benefits of getting the infusion. If you develop any symptoms of shortness of breath, chest pressure, or the inability to keep fluids go immediately to the ER for further evaluation. Continue to hydrate well with water.  Recommendation for supportive treatment for  viral illness include: Vitamin D 5,000 IU daily Vitamin C 500 mg twice daily Zinc 50 mg daily

## 2020-04-14 ENCOUNTER — Telehealth: Payer: Self-pay | Admitting: Nurse Practitioner

## 2020-04-14 ENCOUNTER — Telehealth: Payer: Self-pay | Admitting: Physician Assistant

## 2020-04-14 LAB — NOVEL CORONAVIRUS, NAA: SARS-CoV-2, NAA: DETECTED — AB

## 2020-04-14 LAB — SARS-COV-2, NAA 2 DAY TAT

## 2020-04-14 NOTE — Telephone Encounter (Signed)
Called to Discuss with patient about Covid symptoms and the use of the monoclonal antibody infusion for those with mild to moderate Covid symptoms and at a high risk of hospitalization.     Pt appears to qualify for this infusion due to co-morbid conditions and/or a member of an at-risk group in accordance with the FDA Emergency Use Authorization.    Unable to reach pt at this time.

## 2020-04-14 NOTE — Telephone Encounter (Signed)
Called to Discuss with patient about Covid symptoms and the use of the monoclonal antibody infusion for those with mild to moderate Covid symptoms and at a high risk of hospitalization.     Pt appears to qualify for this infusion due to co-morbid conditions and/or a member of an at-risk group in accordance with the FDA Emergency Use Authorization.    Unable to reach pt - I left a VM and sent MyChart.  She qualifies with high SVI, BMI, and HTN.

## 2020-04-20 ENCOUNTER — Encounter (HOSPITAL_COMMUNITY): Payer: Self-pay

## 2020-04-20 ENCOUNTER — Inpatient Hospital Stay (HOSPITAL_COMMUNITY)
Admission: EM | Admit: 2020-04-20 | Discharge: 2020-04-28 | DRG: 177 | Disposition: A | Discharge status: Home / Self Care | Payer: Medicaid Other | Attending: Internal Medicine | Admitting: Internal Medicine

## 2020-04-20 ENCOUNTER — Other Ambulatory Visit: Payer: Self-pay

## 2020-04-20 ENCOUNTER — Emergency Department (HOSPITAL_COMMUNITY): Payer: Medicaid Other

## 2020-04-20 DIAGNOSIS — J9601 Acute respiratory failure with hypoxia: Secondary | ICD-10-CM | POA: Diagnosis present

## 2020-04-20 DIAGNOSIS — K219 Gastro-esophageal reflux disease without esophagitis: Secondary | ICD-10-CM | POA: Diagnosis present

## 2020-04-20 DIAGNOSIS — G4730 Sleep apnea, unspecified: Secondary | ICD-10-CM | POA: Diagnosis present

## 2020-04-20 DIAGNOSIS — D509 Iron deficiency anemia, unspecified: Secondary | ICD-10-CM | POA: Diagnosis present

## 2020-04-20 DIAGNOSIS — F319 Bipolar disorder, unspecified: Secondary | ICD-10-CM | POA: Diagnosis present

## 2020-04-20 DIAGNOSIS — F419 Anxiety disorder, unspecified: Secondary | ICD-10-CM | POA: Diagnosis present

## 2020-04-20 DIAGNOSIS — Z823 Family history of stroke: Secondary | ICD-10-CM

## 2020-04-20 DIAGNOSIS — Z6841 Body Mass Index (BMI) 40.0 and over, adult: Secondary | ICD-10-CM

## 2020-04-20 DIAGNOSIS — U071 COVID-19: Secondary | ICD-10-CM | POA: Diagnosis not present

## 2020-04-20 DIAGNOSIS — Z8249 Family history of ischemic heart disease and other diseases of the circulatory system: Secondary | ICD-10-CM | POA: Diagnosis not present

## 2020-04-20 DIAGNOSIS — I10 Essential (primary) hypertension: Secondary | ICD-10-CM | POA: Diagnosis present

## 2020-04-20 DIAGNOSIS — J1282 Pneumonia due to coronavirus disease 2019: Secondary | ICD-10-CM | POA: Diagnosis present

## 2020-04-20 DIAGNOSIS — N946 Dysmenorrhea, unspecified: Secondary | ICD-10-CM | POA: Diagnosis present

## 2020-04-20 DIAGNOSIS — Z885 Allergy status to narcotic agent status: Secondary | ICD-10-CM | POA: Diagnosis not present

## 2020-04-20 DIAGNOSIS — Z79899 Other long term (current) drug therapy: Secondary | ICD-10-CM

## 2020-04-20 DIAGNOSIS — E876 Hypokalemia: Secondary | ICD-10-CM | POA: Diagnosis present

## 2020-04-20 DIAGNOSIS — Z87891 Personal history of nicotine dependence: Secondary | ICD-10-CM

## 2020-04-20 DIAGNOSIS — R0902 Hypoxemia: Secondary | ICD-10-CM | POA: Diagnosis present

## 2020-04-20 DIAGNOSIS — Z9049 Acquired absence of other specified parts of digestive tract: Secondary | ICD-10-CM

## 2020-04-20 LAB — PROTIME-INR
INR: 1.1 (ref 0.8–1.2)
Prothrombin Time: 13.3 seconds (ref 11.4–15.2)

## 2020-04-20 LAB — CBC WITH DIFFERENTIAL/PLATELET
Abs Immature Granulocytes: 0.06 10*3/uL (ref 0.00–0.07)
Basophils Absolute: 0 10*3/uL (ref 0.0–0.1)
Basophils Relative: 0 %
Eosinophils Absolute: 0 10*3/uL (ref 0.0–0.5)
Eosinophils Relative: 0 %
HCT: 36.9 % (ref 36.0–46.0)
Hemoglobin: 11.9 g/dL — ABNORMAL LOW (ref 12.0–15.0)
Immature Granulocytes: 1 %
Lymphocytes Relative: 10 %
Lymphs Abs: 0.9 10*3/uL (ref 0.7–4.0)
MCH: 25.4 pg — ABNORMAL LOW (ref 26.0–34.0)
MCHC: 32.2 g/dL (ref 30.0–36.0)
MCV: 78.8 fL — ABNORMAL LOW (ref 80.0–100.0)
Monocytes Absolute: 0.2 10*3/uL (ref 0.1–1.0)
Monocytes Relative: 2 %
Neutro Abs: 7.9 10*3/uL — ABNORMAL HIGH (ref 1.7–7.7)
Neutrophils Relative %: 87 %
Platelets: 277 10*3/uL (ref 150–400)
RBC: 4.68 MIL/uL (ref 3.87–5.11)
RDW: 23.4 % — ABNORMAL HIGH (ref 11.5–15.5)
WBC: 9 10*3/uL (ref 4.0–10.5)
nRBC: 0 % (ref 0.0–0.2)

## 2020-04-20 LAB — COMPREHENSIVE METABOLIC PANEL
ALT: 19 U/L (ref 0–44)
AST: 27 U/L (ref 15–41)
Albumin: 3.5 g/dL (ref 3.5–5.0)
Alkaline Phosphatase: 41 U/L (ref 38–126)
Anion gap: 11 (ref 5–15)
BUN: <5 mg/dL — ABNORMAL LOW (ref 6–20)
CO2: 25 mmol/L (ref 22–32)
Calcium: 8.3 mg/dL — ABNORMAL LOW (ref 8.9–10.3)
Chloride: 96 mmol/L — ABNORMAL LOW (ref 98–111)
Creatinine, Ser: 0.69 mg/dL (ref 0.44–1.00)
GFR, Estimated: >60 mL/min (ref >60)
Glucose, Bld: 120 mg/dL — ABNORMAL HIGH (ref 70–99)
Potassium: 3 mmol/L — ABNORMAL LOW (ref 3.5–5.1)
Sodium: 132 mmol/L — ABNORMAL LOW (ref 135–145)
Total Bilirubin: 0.6 mg/dL (ref 0.3–1.2)
Total Protein: 8.3 g/dL — ABNORMAL HIGH (ref 6.5–8.1)

## 2020-04-20 LAB — PROCALCITONIN: Procalcitonin: <0.1 ng/mL

## 2020-04-20 LAB — D-DIMER, QUANTITATIVE: D-Dimer, Quant: 0.85 ug/mL-FEU — ABNORMAL HIGH (ref 0.00–0.50)

## 2020-04-20 LAB — URINALYSIS, ROUTINE W REFLEX MICROSCOPIC
Bilirubin Urine: NEGATIVE
Glucose, UA: NEGATIVE mg/dL
Ketones, ur: 5 mg/dL — AB
Nitrite: NEGATIVE
Protein, ur: 100 mg/dL — AB
Specific Gravity, Urine: 1.002 — ABNORMAL LOW (ref 1.005–1.030)
pH: 6 (ref 5.0–8.0)

## 2020-04-20 LAB — FIBRINOGEN: Fibrinogen: 660 mg/dL — ABNORMAL HIGH (ref 210–475)

## 2020-04-20 LAB — LACTIC ACID, PLASMA
Lactic Acid, Venous: 0.9 mmol/L (ref 0.5–1.9)
Lactic Acid, Venous: 0.9 mmol/L (ref 0.5–1.9)

## 2020-04-20 LAB — TRIGLYCERIDES: Triglycerides: 62 mg/dL (ref <150)

## 2020-04-20 LAB — MAGNESIUM: Magnesium: 2 mg/dL (ref 1.7–2.4)

## 2020-04-20 LAB — I-STAT BETA HCG BLOOD, ED (MC, WL, AP ONLY): I-stat hCG, quantitative: <5 m[IU]/mL (ref <5)

## 2020-04-20 LAB — HIV ANTIBODY (ROUTINE TESTING W REFLEX): HIV Screen 4th Generation wRfx: NONREACTIVE

## 2020-04-20 LAB — FERRITIN: Ferritin: 324 ng/mL — ABNORMAL HIGH (ref 11–307)

## 2020-04-20 LAB — C-REACTIVE PROTEIN: CRP: 16 mg/dL — ABNORMAL HIGH (ref <1.0)

## 2020-04-20 MED ORDER — ACETAMINOPHEN 325 MG PO TABS
650.0000 mg | ORAL_TABLET | Freq: Four times a day (QID) | ORAL | Status: DC | PRN
  Filled 2020-04-20: qty 2

## 2020-04-20 MED ORDER — AMLODIPINE BESYLATE 10 MG PO TABS
10.0000 mg | ORAL_TABLET | Freq: Every day | ORAL | Status: DC
  Administered 2020-04-21 – 2020-04-28 (×8): 10 mg via ORAL
  Filled 2020-04-20 (×8): qty 1

## 2020-04-20 MED ORDER — PANTOPRAZOLE SODIUM 40 MG PO TBEC
40.0000 mg | DELAYED_RELEASE_TABLET | Freq: Every day | ORAL | Status: DC
  Administered 2020-04-20 – 2020-04-26 (×7): 40 mg via ORAL
  Filled 2020-04-20 (×7): qty 1

## 2020-04-20 MED ORDER — DEXAMETHASONE SODIUM PHOSPHATE 10 MG/ML IJ SOLN
6.0000 mg | Freq: Every day | INTRAMUSCULAR | Status: DC
  Administered 2020-04-20 – 2020-04-21 (×2): 6 mg via INTRAVENOUS
  Filled 2020-04-20 (×2): qty 1

## 2020-04-20 MED ORDER — POTASSIUM CHLORIDE CRYS ER 20 MEQ PO TBCR
40.0000 meq | EXTENDED_RELEASE_TABLET | Freq: Once | ORAL | Status: AC
  Administered 2020-04-20: 40 meq via ORAL
  Filled 2020-04-20: qty 2

## 2020-04-20 MED ORDER — GUAIFENESIN-DM 100-10 MG/5ML PO SYRP: 10.0000 mL | ORAL_SOLUTION | ORAL | Status: DC | PRN

## 2020-04-20 MED ORDER — ENOXAPARIN SODIUM 60 MG/0.6ML ~~LOC~~ SOLN
60.0000 mg | SUBCUTANEOUS | Status: DC
  Administered 2020-04-20 – 2020-04-27 (×8): 60 mg via SUBCUTANEOUS
  Filled 2020-04-20 (×8): qty 0.6

## 2020-04-20 MED ORDER — DEXAMETHASONE SODIUM PHOSPHATE 10 MG/ML IJ SOLN
10.0000 mg | Freq: Once | INTRAMUSCULAR | Status: AC
  Administered 2020-04-20: 10 mg via INTRAVENOUS
  Filled 2020-04-20: qty 1

## 2020-04-20 MED ORDER — IPRATROPIUM-ALBUTEROL 20-100 MCG/ACT IN AERS
1.0000 | INHALATION_SPRAY | Freq: Four times a day (QID) | RESPIRATORY_TRACT | Status: DC
  Administered 2020-04-20 – 2020-04-28 (×27): 1 via RESPIRATORY_TRACT
  Filled 2020-04-20: qty 4

## 2020-04-20 MED ORDER — SODIUM CHLORIDE 0.9 % IV SOLN
100.0000 mg | Freq: Every day | INTRAVENOUS | Status: AC
  Administered 2020-04-21 – 2020-04-24 (×4): 100 mg via INTRAVENOUS
  Filled 2020-04-20 (×5): qty 20

## 2020-04-20 MED ORDER — BARICITINIB 2 MG PO TABS
4.0000 mg | ORAL_TABLET | Freq: Every day | ORAL | Status: DC
  Administered 2020-04-20 – 2020-04-28 (×9): 4 mg via ORAL
  Filled 2020-04-20 (×9): qty 2

## 2020-04-20 MED ORDER — METOPROLOL TARTRATE 5 MG/5ML IV SOLN
5.0000 mg | Freq: Three times a day (TID) | INTRAVENOUS | Status: DC | PRN
  Administered 2020-04-20: 5 mg via INTRAVENOUS
  Filled 2020-04-20: qty 5

## 2020-04-20 MED ORDER — SODIUM CHLORIDE 0.9 % IV SOLN
200.0000 mg | Freq: Once | INTRAVENOUS | Status: AC
  Administered 2020-04-20: 200 mg via INTRAVENOUS
  Filled 2020-04-20: qty 40

## 2020-04-20 NOTE — H&P (Signed)
History and Physical        Hospital Admission Note Date: 04/20/2020  Patient name: [name] Medical record number: [id number] Date of birth: [birth year] Age: 34 y.o. Gender: female  PCP: [name], PA  Patient coming from: Home Lives with: Family At baseline, ambulates: Independently  Chief Complaint    Chief Complaint  Patient presents with  . Covid Positive      HPI:   This is a 34 year old female who is unvaccinated against COVID-19 with past medical history of hypertension, GERD and morbid obesity who initially went to urgent care on November 5 after 3 days of shortness of breath and tested positive for COVID-19 who presented to the ED with worsening symptoms. Initially her symptoms improved after her urgent care visit but then they worsened. Took ibuprofen this a.m. for her headache and fever with minimal improvement. Per EMS, the patient was found to have O2 sat in the low 80s and was given 500 NS bolus as well as placed on 15 LPM via NRB.  States that she has been having some diarrhea, change in taste and denies any chest pain.  No history of tobacco or alcohol use or VTE in the past.  Has had intermittent fevers at home up to 102 F.  ED Course: Febrile 101.5 F, tachycardic, tachypneic, hypertensive requiring between 10-15 LPM O2. Notable labs: Sodium 132, K3.0, Lactic acid 0.9, WBC 9.0, Hb 11.9, INR 1.1, remaining COVID-19 labs pending. CXR with multifocal pneumonia. She was given Decadron 10 mg IV x1 in the ED.  Vitals:   04/20/20 1140 04/20/20 1245  BP: (!) 158/78 (!) 148/100  Pulse: (!) 121 (!) 118  Resp: (!) 22 (!) 24  Temp: (!) 101.5 F (38.6 C)   SpO2: 90% 92%     Review of Systems:  Review of Systems  All other systems reviewed and are negative.   Medical/Social/Family History   Past Medical History: Past Medical History:   Diagnosis Date  . Abnormal Pap smear    colposcopy, mild dysplasia, HPV  . Acid reflux   . Anxiety    on meds- stable  . Bronchitis   . Chlamydia   . Depression    on meds for bipolar  . Fibroid   . Gonorrhea   . History of PID   . Hypertension   . Obesity   . Panic attack   . Pregnancy induced hypertension   . Sleep apnea    hasnt uses CPAP x 4 years  . Trichomonas   . Urinary tract infection     Past Surgical History:  Procedure Laterality Date  . ABDOMINAL SURGERY    . CESAREAN SECTION  05/02/03,05/04/08,10/23/10   x3  . CHOLECYSTECTOMY N/A 10/07/2012   Procedure: LAPAROSCOPIC CHOLECYSTECTOMY WITH INTRAOPERATIVE CHOLANGIOGRAM;  Surgeon: Imogene Burn. Georgette Dover, MD;  Location: [facility];  Service: General;  Laterality: N/A;  . ESOPHAGOGASTRODUODENOSCOPY (EGD) WITH PROPOFOL N/A 10/08/2018   Procedure: ESOPHAGOGASTRODUODENOSCOPY (EGD) WITH PROPOFOL;  Surgeon: Carol Ada, MD;  Location: [facility];  Service: Endoscopy;  Laterality: N/A;  . FOOT SURGERY    . INSERTION OF MESH N/A 12/02/2012   Procedure: INSERTION OF MESH;  Surgeon: Adin Hector, MD;  Location:  [facility];  Service: General;  Laterality: N/A;  . TUBAL LIGATION    . UMBILICAL HERNIA REPAIR N/A 12/02/2012   Procedure: LAPAROSCOPIC UMBILICAL HERNIA;  Surgeon: Adin Hector, MD;  Location: [facility];  Service: General;  Laterality: N/A;  . WISDOM TOOTH EXTRACTION      Medications: Prior to Admission medications   Medication Sig Start Date End Date Taking? Authorizing Provider  amLODipine (NORVASC) 10 MG tablet Take 10 mg by mouth daily. 09/07/18   [provider]  brompheniramine-pseudoephedrine-DM 30-2-10 MG/5ML syrup Take 5 mLs by mouth 4 (four) times daily as needed. 04/13/20   Scot Jun, FNP  Cyanocobalamin (B-12) 1000 MCG SUBL Place 1,000 mcg under the tongue daily. 03/12/20   Brunetta Genera, MD  ergocalciferol (VITAMIN D2) 1.25 MG (50000 UT) capsule Take by mouth.    [provider]   ibuprofen (ADVIL) 200 MG tablet Take 400-600 mg by mouth every 6 (six) hours as needed for headache or moderate pain.    [provider]  ipratropium (ATROVENT) 0.03 % nasal spray Place 2 sprays into both nostrils 2 (two) times daily. 04/13/20   Scot Jun, FNP  Olopatadine HCl 0.2 % SOLN Apply 1 drop to eye every morning. 01/11/20   [provider]  omeprazole (PRILOSEC) 10 MG capsule Take 20 mg by mouth as needed.    [provider]    Allergies:   Allergies  Allergen Reactions  . Percocet [Oxycodone-Acetaminophen] Hives and Rash    Social History:  reports that she has quit smoking. Her smoking use included cigarettes. She has a 0.25 pack-year smoking history. She has never used smokeless tobacco. She reports that she does not drink alcohol and does not use drugs.  Family History: Family History  Problem Relation Age of Onset  . Hypertension Mother   . Heart disease Father   . Stroke Father   . Other Neg Hx      Objective   Physical Exam: Blood pressure (!) 148/100, pulse (!) 118, temperature (!) 101.5 F (38.6 C), temperature source Oral, resp. rate (!) 24, height 5\' 5"  (1.651 m), weight 132.9 kg, last menstrual period 04/20/2020, SpO2 92 %.  Physical Exam Vitals and nursing note reviewed.  Constitutional:      Appearance: She is obese.  HENT:     Head: Normocephalic and atraumatic.  Eyes:     Conjunctiva/sclera: Conjunctivae normal.  Cardiovascular:     Rate and Rhythm: Regular rhythm. Tachycardia present.  Pulmonary:     Effort: Pulmonary effort is normal. No respiratory distress.     Comments: Conversational dyspnea Abdominal:     General: Abdomen is flat.     Palpations: Abdomen is soft.  Musculoskeletal:        General: No swelling or tenderness.  Skin:    Coloration: Skin is not jaundiced or pale.  Neurological:     Mental Status: She is alert. Mental status is at baseline.  Psychiatric:        Mood and Affect: Mood  normal.        Behavior: Behavior normal.     LABS on Admission: I have personally reviewed all the labs and imaging below    Basic Metabolic Panel: Recent Labs  Lab 04/20/20 1210  NA 132*  K 3.0*  CL 96*  CO2 25  GLUCOSE 120*  BUN <5*  CREATININE 0.69  CALCIUM 8.3*   Liver Function Tests: Recent Labs  Lab 04/20/20 1210  AST 27  ALT 19  ALKPHOS 41  BILITOT 0.6  PROT 8.3*  ALBUMIN 3.5   No results for input(s): LIPASE, AMYLASE in the last 168 hours. No results for input(s): AMMONIA in the last 168 hours. CBC: Recent Labs  Lab 04/20/20 1210  WBC 9.0  NEUTROABS 7.9*  HGB 11.9*  HCT 36.9  MCV 78.8*  PLT 277   Cardiac Enzymes: No results for input(s): CKTOTAL, CKMB, CKMBINDEX, TROPONINI in the last 168 hours. BNP: Invalid input(s): POCBNP CBG: No results for input(s): GLUCAP in the last 168 hours.  Radiological Exams on Admission:  DG Chest Port 1 View  Result Date: 04/20/2020 CLINICAL DATA:  Shortness of breath, headache and fever. EXAM: PORTABLE CHEST 1 VIEW COMPARISON:  11/07/2013 chest radiograph and prior. FINDINGS: Hypoinflated lungs. Patchy bilateral pulmonary airspace opacities. No pneumothorax or pleural effusion. Cardiomediastinal silhouette within normal limits. No acute osseous abnormality. IMPRESSION: Multifocal pneumonia. Electronically Signed   By: Primitivo Gauze M.D.   On: 04/20/2020 12:10      EKG: Independently reviewed.    A & P   Principal Problem:   Acute hypoxemic respiratory failure due to COVID-19 [facility]) Active Problems:   Hypertension   Acid reflux   Obesity, Class III, BMI 40-49.9 (morbid obesity) ([place])   1. Acute hypoxic respiratory failure secondary to COVID-19 O2 Requirements: 0 L/min at baseline, currently 10-15 L/min heliox [place] CRP 16, D Dimer pending, Procalcitonin pending She is febrile with minimally productive cough but with no leukocytosis and no consolidation on CXR, less likely superimposed bacterial  pneumonia  Remdesivir x 5 days, Steroids x 10 days, Start Baricitinib (risks and benefits of this medication were discussed with the patient who agrees to start) Inhalers, antitussives Encourage Incentive Spirometry, Flutter Valve and Pronation as tolerated  2. Hypertension a. Continue home amlodipine b. Add on as needed Lopressor  3. GERD a. Continue home PPI  4. Morbid obesity Body mass index is 48.76 kg/m.   DVT prophylaxis: Lovenox   Code Status: Prior  Diet: Heart healthy Family Communication: Admission, patients condition and plan of care including tests being ordered have been discussed with the patient who indicates understanding and agrees with the plan and Code Status. Patient's mother was updated  Disposition Plan: The appropriate patient status for this patient is INPATIENT. Inpatient status is judged to be reasonable and necessary in order to provide the required intensity of service to ensure the patient's safety. The patient's presenting symptoms, physical exam findings, and initial radiographic and laboratory data in the context of their chronic comorbidities is felt to place them at high risk for further clinical deterioration. Furthermore, it is not anticipated that the patient will be medically stable for discharge from the hospital within 2 midnights of admission. The following factors support the patient status of inpatient.   " The patient's presenting symptoms include shortness of breath, cough. " The worrisome physical exam findings include O2 requirements, slight conversational dyspnea. " The initial radiographic and laboratory data are worrisome because of COVID-19 positive, elevated inflammatory markers. " The chronic co-morbidities include obesity, GERD, hypertension.   * I certify that at the point of admission it is my clinical judgment that the patient will require inpatient hospital care spanning beyond 2 midnights from the point of admission due to high  intensity of service, high risk for further deterioration and high frequency of surveillance required.*  Consultants  . None  Procedures  . None  Time Spent on Admission: 65 minutes    Kevan Ny E  Neysa Bonito, DO [facility]  04/20/2020, 2:07 PM

## 2020-04-20 NOTE — ED Notes (Addendum)
Report called to [place]

## 2020-04-20 NOTE — ED Notes (Signed)
Report called to [phone] RN lance

## 2020-04-20 NOTE — ED Provider Notes (Signed)
[facility] DEPT Provider Note   CSN: [id number] Arrival date & time: 04/20/20  1111     History Chief Complaint  Patient presents with  . Covid Positive    [name] is a 34 y.o. female.  34 year old female brought in by EMS from home, Covid positive with worsening shortness of breath.  Patient went to urgent care on November 5 after 3 days of symptoms, had a positive home test as well as a positive test at urgent care.  Patient has been trying to manage her symptoms at home was improving and then became significantly worse.  Reports history of hypertension, is not vaccinated, is a non-smoker.  Patient was found to have O2 sat in the low 80s with EMS, was given 500 normal saline bolus as well as 15 L nonrebreather.  Patient arrives to the ER febrile with a temperature of 101.5, last had ibuprofen 2-1/2 hours ago.  [name] was evaluated in Emergency Department on 04/20/2020 for the symptoms described in the history of present illness. She was evaluated in the context of the global COVID-19 pandemic, which necessitated consideration that the patient might be at risk for infection with the SARS-CoV-2 virus that causes COVID-19. Institutional protocols and algorithms that pertain to the evaluation of patients at risk for COVID-19 are in a state of rapid change based on information released by regulatory bodies including the CDC and federal and state organizations. These policies and algorithms were followed during the patient's care in the ED.         Past Medical History:  Diagnosis Date  . Abnormal Pap smear    colposcopy, mild dysplasia, HPV  . Acid reflux   . Anxiety    on meds- stable  . Bronchitis   . Chlamydia   . Depression    on meds for bipolar  . Fibroid   . Gonorrhea   . History of PID   . Hypertension   . Obesity   . Panic attack   . Pregnancy induced hypertension   . Sleep apnea    hasnt uses CPAP x 4 years  .  Trichomonas   . Urinary tract infection     Patient Active Problem List   Diagnosis Date Noted  . Iron deficiency anemia 03/06/2020  . Incarcerated incisional hernia s/p lap repair 12/02/2012 12/03/2012  . Hypertension   . Acid reflux   . Anxiety   . Obesity, Class III, BMI 40-49.9 (morbid obesity) ([place])   . Sleep apnea   . Chronic cholecystitis with calculus s/p lap chole [id number] 10/04/2012    Past Surgical History:  Procedure Laterality Date  . ABDOMINAL SURGERY    . CESAREAN SECTION  05/02/03,05/04/08,10/23/10   x3  . CHOLECYSTECTOMY N/A 10/07/2012   Procedure: LAPAROSCOPIC CHOLECYSTECTOMY WITH INTRAOPERATIVE CHOLANGIOGRAM;  Surgeon: Imogene Burn. Georgette Dover, MD;  Location: [facility];  Service: General;  Laterality: N/A;  . ESOPHAGOGASTRODUODENOSCOPY (EGD) WITH PROPOFOL N/A 10/08/2018   Procedure: ESOPHAGOGASTRODUODENOSCOPY (EGD) WITH PROPOFOL;  Surgeon: Carol Ada, MD;  Location: [facility];  Service: Endoscopy;  Laterality: N/A;  . FOOT SURGERY    . INSERTION OF MESH N/A 12/02/2012   Procedure: INSERTION OF MESH;  Surgeon: Adin Hector, MD;  Location: [facility];  Service: General;  Laterality: N/A;  . TUBAL LIGATION    . UMBILICAL HERNIA REPAIR N/A 12/02/2012   Procedure: LAPAROSCOPIC UMBILICAL HERNIA;  Surgeon: Adin Hector, MD;  Location: [facility];  Service: General;  Laterality: N/A;  .  WISDOM TOOTH EXTRACTION       OB History    Gravida  3   Para  3   Term  3   Preterm      AB      Living  3     SAB      TAB      Ectopic      Multiple      Live Births  2           Family History  Problem Relation Age of Onset  . Hypertension Mother   . Heart disease Father   . Stroke Father   . Other Neg Hx     Social History   Tobacco Use  . Smoking status: Former Smoker    Packs/day: 0.25    Years: 1.00    Pack years: 0.25    Types: Cigarettes  . Smokeless tobacco: Never Used  Substance Use Topics  . Alcohol use: No    Comment: socially  . Drug use: No     Home Medications Prior to Admission medications   Medication Sig Start Date End Date Taking? Authorizing Provider  amLODipine (NORVASC) 10 MG tablet Take 10 mg by mouth daily. 09/07/18   [provider]  brompheniramine-pseudoephedrine-DM 30-2-10 MG/5ML syrup Take 5 mLs by mouth 4 (four) times daily as needed. 04/13/20   Scot Jun, FNP  Cyanocobalamin (B-12) 1000 MCG SUBL Place 1,000 mcg under the tongue daily. 03/12/20   Brunetta Genera, MD  ergocalciferol (VITAMIN D2) 1.25 MG (50000 UT) capsule Take by mouth.    [provider]  ibuprofen (ADVIL) 200 MG tablet Take 400-600 mg by mouth every 6 (six) hours as needed for headache or moderate pain.    [provider]  ipratropium (ATROVENT) 0.03 % nasal spray Place 2 sprays into both nostrils 2 (two) times daily. 04/13/20   Scot Jun, FNP  Olopatadine HCl 0.2 % SOLN Apply 1 drop to eye every morning. 01/11/20   [provider]  omeprazole (PRILOSEC) 10 MG capsule Take 20 mg by mouth as needed.    [provider]    Allergies    Percocet [oxycodone-acetaminophen]  Review of Systems   Review of Systems  Constitutional: Positive for fever.  HENT: Positive for congestion.   Respiratory: Positive for cough and shortness of breath.   Cardiovascular: Negative for chest pain.  Gastrointestinal: Negative for nausea and vomiting.  Genitourinary: Negative for difficulty urinating.  Musculoskeletal: Positive for arthralgias and myalgias.  Skin: Negative for rash.  Allergic/Immunologic: Negative for immunocompromised state.  Neurological: Positive for weakness.  Hematological: Negative for adenopathy.  Psychiatric/Behavioral: The patient is nervous/anxious.   All other systems reviewed and are negative.   Physical Exam Updated Vital Signs BP (!) 148/100   Pulse (!) 118   Temp (!) 101.5 F (38.6 C) (Oral)   Resp (!) 24   Ht 5\' 5"  (1.651 m)   Wt 132.9 kg   LMP 04/20/2020  (Exact Date)   SpO2 92%   BMI 48.76 kg/m   Physical Exam Vitals and nursing note reviewed.  Constitutional:      General: She is not in acute distress.    Appearance: She is well-developed. She is obese. She is not diaphoretic.  HENT:     Head: Normocephalic and atraumatic.     Mouth/Throat:     Mouth: Mucous membranes are moist.  Eyes:     Conjunctiva/sclera: Conjunctivae normal.  Cardiovascular:  Rate and Rhythm: Regular rhythm. Tachycardia present.     Pulses: Normal pulses.     Heart sounds: Normal heart sounds.  Pulmonary:     Effort: Tachypnea present.     Breath sounds: Normal breath sounds.  Abdominal:     Palpations: Abdomen is soft.     Tenderness: There is no abdominal tenderness.  Musculoskeletal:     Cervical back: Neck supple. No tenderness.     Right lower leg: No edema.     Left lower leg: No edema.  Skin:    General: Skin is warm and dry.     Findings: No erythema or rash.  Neurological:     Mental Status: She is alert and oriented to person, place, and time.  Psychiatric:        Behavior: Behavior normal.     ED Results / Procedures / Treatments   Labs (all labs ordered are listed, but only abnormal results are displayed) Labs Reviewed  COMPREHENSIVE METABOLIC PANEL - Abnormal; Notable for the following components:      Result Value   Sodium 132 (*)    Potassium 3.0 (*)    Chloride 96 (*)    Glucose, Bld 120 (*)    BUN <5 (*)    Calcium 8.3 (*)    Total Protein 8.3 (*)    All other components within normal limits  CBC WITH DIFFERENTIAL/PLATELET - Abnormal; Notable for the following components:   Hemoglobin 11.9 (*)    MCV 78.8 (*)    MCH 25.4 (*)    RDW 23.4 (*)    Neutro Abs 7.9 (*)    All other components within normal limits  CULTURE, BLOOD (ROUTINE X 2)  CULTURE, BLOOD (ROUTINE X 2)  CULTURE, BLOOD (ROUTINE X 2)  LACTIC ACID, PLASMA  PROTIME-INR  LACTIC ACID, PLASMA  URINALYSIS, ROUTINE W REFLEX MICROSCOPIC  D-DIMER,  QUANTITATIVE (NOT AT [facility])  PROCALCITONIN  LACTATE DEHYDROGENASE  FERRITIN  TRIGLYCERIDES  FIBRINOGEN  C-REACTIVE PROTEIN  I-STAT BETA HCG BLOOD, ED ([facility], [facility], [facility] ONLY)    EKG EKG Interpretation  Date/Time:  Friday April 20 2020 12:53:55 EST Ventricular Rate:  116 PR Interval:    QRS Duration: 98 QT Interval:  325 QTC Calculation: 452 R Axis:   40 Text Interpretation: Sinus tachycardia Borderline repolarization abnormality Since last tracing rate faster Confirmed by Dorie Rank [phone]) on 04/20/2020 1:00:30 PM   Radiology DG Chest Port 1 View  Result Date: 04/20/2020 CLINICAL DATA:  Shortness of breath, headache and fever. EXAM: PORTABLE CHEST 1 VIEW COMPARISON:  11/07/2013 chest radiograph and prior. FINDINGS: Hypoinflated lungs. Patchy bilateral pulmonary airspace opacities. No pneumothorax or pleural effusion. Cardiomediastinal silhouette within normal limits. No acute osseous abnormality. IMPRESSION: Multifocal pneumonia. Electronically Signed   By: Primitivo Gauze M.D.   On: 04/20/2020 12:10    Procedures .Critical Care Performed by: Tacy Learn, PA-C Authorized by: Tacy Learn, PA-C   Critical care provider statement:    Critical care time (minutes):  45   Critical care was time spent personally by me on the following activities:  Discussions with consultants, evaluation of patient's response to treatment, examination of patient, ordering and performing treatments and interventions, ordering and review of laboratory studies, ordering and review of radiographic studies, pulse oximetry, re-evaluation of patient's condition, obtaining history from patient or surrogate and review of old charts   (including critical care time)  Medications Ordered in ED Medications  dexamethasone (DECADRON) injection 10 mg (10 mg Intravenous  Given 04/20/20 1221)    ED Course  I have reviewed the triage vital signs and the nursing notes.  Pertinent labs & imaging results  that were available during my care of the patient were reviewed by me and considered in my medical decision making (see chart for details).  Clinical Course as of Apr 21 1315  Fri Apr 21, 3147  823 34 year old female brought in by EMS from home for [facility] with COVID.  Patient reports she was feeling better and then condition worsened. O2 sat in the 80s on room air, placed on NRB at 15L with EMS, transitioned to hi-flow [place] at 10L with RT while in the ER, maintaining low 90s sats.  Patient is febrile, tachycardic. Able to speak in complete sentences, lungs clear.    [LM]  1313 CXR with multifocal PNA.  CBC without significant findings, CMP with mild hypokalemia at 3.0. Lactic acid WNL at 0.9. Patient was given Decadron and monitored. Consult to hospitalist who will consult for admission.    [LM]    Clinical Course User Index [LM] Roque Lias   MDM Rules/Calculators/A&P                          Final Clinical Impression(s) / ED Diagnoses Final diagnoses:  COVID-19  Hypoxemia    Rx / DC Orders ED Discharge Orders    None       Tacy Learn, PA-C 04/20/20 1316    Dorie Rank, MD 04/21/20 [phone]

## 2020-04-20 NOTE — ED Triage Notes (Signed)
Transported by [facility] from home-- diagnosed with COVID last Friday and reports no change in symptoms (shob/headache/fever). Patient took Ibuprofen at 10 am with minimal relief. 34 G IV in left AC and EMS administered 500 cc of NS PTA.

## 2020-04-21 LAB — COMPREHENSIVE METABOLIC PANEL WITH GFR
ALT: 19 U/L (ref 0–44)
AST: 23 U/L (ref 15–41)
Albumin: 3.2 g/dL — ABNORMAL LOW (ref 3.5–5.0)
Alkaline Phosphatase: 40 U/L (ref 38–126)
Anion gap: 10 (ref 5–15)
BUN: 11 mg/dL (ref 6–20)
CO2: 25 mmol/L (ref 22–32)
Calcium: 8.6 mg/dL — ABNORMAL LOW (ref 8.9–10.3)
Chloride: 103 mmol/L (ref 98–111)
Creatinine, Ser: 0.63 mg/dL (ref 0.44–1.00)
GFR, Estimated: >60 mL/min
Glucose, Bld: 125 mg/dL — ABNORMAL HIGH (ref 70–99)
Potassium: 3.8 mmol/L (ref 3.5–5.1)
Sodium: 138 mmol/L (ref 135–145)
Total Bilirubin: 0.6 mg/dL (ref 0.3–1.2)
Total Protein: 7.6 g/dL (ref 6.5–8.1)

## 2020-04-21 LAB — BLOOD CULTURE ID PANEL (REFLEXED) - BCID2

## 2020-04-21 LAB — FERRITIN: Ferritin: 365 ng/mL — ABNORMAL HIGH (ref 11–307)

## 2020-04-21 LAB — CBC WITH DIFFERENTIAL/PLATELET
Abs Immature Granulocytes: 0.05 10*3/uL (ref 0.00–0.07)
Basophils Absolute: 0 10*3/uL (ref 0.0–0.1)
Basophils Relative: 0 %
Eosinophils Absolute: 0 10*3/uL (ref 0.0–0.5)
Eosinophils Relative: 0 %
HCT: 36.7 % (ref 36.0–46.0)
Hemoglobin: 11.5 g/dL — ABNORMAL LOW (ref 12.0–15.0)
Immature Granulocytes: 1 %
Lymphocytes Relative: 11 %
Lymphs Abs: 0.8 10*3/uL (ref 0.7–4.0)
MCH: 24.9 pg — ABNORMAL LOW (ref 26.0–34.0)
MCHC: 31.3 g/dL (ref 30.0–36.0)
MCV: 79.6 fL — ABNORMAL LOW (ref 80.0–100.0)
Monocytes Absolute: 0.2 10*3/uL (ref 0.1–1.0)
Monocytes Relative: 3 %
Neutro Abs: 6.6 10*3/uL (ref 1.7–7.7)
Neutrophils Relative %: 85 %
Platelets: 353 10*3/uL (ref 150–400)
RBC: 4.61 MIL/uL (ref 3.87–5.11)
RDW: 23.8 % — ABNORMAL HIGH (ref 11.5–15.5)
WBC: 7.7 10*3/uL (ref 4.0–10.5)
nRBC: 0 % (ref 0.0–0.2)

## 2020-04-21 LAB — D-DIMER, QUANTITATIVE: D-Dimer, Quant: 0.89 ug{FEU}/mL — ABNORMAL HIGH (ref 0.00–0.50)

## 2020-04-21 LAB — C-REACTIVE PROTEIN: CRP: 15.1 mg/dL — ABNORMAL HIGH (ref <1.0)

## 2020-04-21 MED ORDER — IBUPROFEN 200 MG PO TABS
600.0000 mg | ORAL_TABLET | Freq: Four times a day (QID) | ORAL | Status: DC | PRN
  Administered 2020-04-21 – 2020-04-26 (×8): 600 mg via ORAL
  Filled 2020-04-21 (×9): qty 3

## 2020-04-21 MED ORDER — METHYLPREDNISOLONE SODIUM SUCC 125 MG IJ SOLR
80.0000 mg | Freq: Two times a day (BID) | INTRAMUSCULAR | Status: DC
  Administered 2020-04-21 – 2020-04-24 (×6): 80 mg via INTRAVENOUS
  Filled 2020-04-21 (×6): qty 2

## 2020-04-21 MED ORDER — IBUPROFEN 200 MG PO TABS
400.0000 mg | ORAL_TABLET | Freq: Once | ORAL | Status: AC
  Administered 2020-04-21: 400 mg via ORAL
  Filled 2020-04-21: qty 2

## 2020-04-21 NOTE — Plan of Care (Signed)
  Problem: Education: Goal: Knowledge of risk factors and measures for prevention of condition will improve Outcome: Progressing   Problem: Coping: Goal: Psychosocial and spiritual needs will be supported Outcome: Progressing   Problem: Respiratory: Goal: Will maintain a patent airway Outcome: Progressing Goal: Complications related to the disease process, condition or treatment will be avoided or minimized Outcome: Progressing   

## 2020-04-21 NOTE — Progress Notes (Signed)
Patient ringing yellow MEWS due to high BP and increased HR 110-115. Patient has a history of HTN on medication. Charge Nurse and MD made aware, no orders made. Will continue to monitor

## 2020-04-21 NOTE — Progress Notes (Signed)
PROGRESS NOTE  [name]  FVC:[id number] DOB: [birth year] DOA: 04/20/2020 PCP: [name], PA   Brief Narrative: [name] is a 34 y.o. female with a history of morbid obesity, HTN, GERD, and covid-19 infection diagnosed 11/5 who presented to the ED 11/12 with worsening dyspnea found to be severely hypoxic requiring 15L O2, febrile to 101.17F, tachycardic, tachypneic, and hypertensive with CXR confirming multifocal pneumonia. CRP 16. Remdesivir, steroids, and baricitinib were given on admission for acute hypoxic respiratory failure due to covid-19 pneumonia.   Assessment & Plan: Principal Problem:   Acute hypoxemic respiratory failure due to COVID-19 [facility]) Active Problems:   Hypertension   Acid reflux   Obesity, Class III, BMI 40-49.9 (morbid obesity) ([place])  Acute hypoxemic respiratory failure due to covid-19 pneumonia: SARS-CoV-2 positive on 11/5.  - Continue remdesivir x5 days (11/12 - 11/16) - Continue steroids, augment to solumedrol.  - Continue baricitinib. Will have to monitor Cr, LFTs, blood counts, and blood cultures. With negative PCT and Cx being presumed contaminant, will continue current management. - Encourage OOB, IS, FV, and awake proning if able - Continue airborne, contact precautions for 21 days from positive testing. - Monitor CMP and inflammatory markers - Enoxaparin prophylactic dose.   HTN:  - Continue norvasc 10mg , continue metoprolol prn  S. epidermidis in 1 of 4 blood culture bottles: Consistent with contaminant at this time.  - Continue monitoring.   GERD:  - Continue PPI  Menstrual cramps:  - Ibuprofen prn since tylenol ineffective.   Morbid obesity: Estimated body mass index is 48.76 kg/m as calculated from the following:   Height as of this encounter: 5\' 5"  (1.651 m).   Weight as of this encounter: 132.9 kg.  DVT prophylaxis: Lovenox Code Status: Full Family Communication: None at bedside Disposition Plan:  Status is:  Inpatient  Remains inpatient appropriate because:Inpatient level of care appropriate due to severity of illness  Dispo: The patient is from: Home              Anticipated d/c is to: Home              Anticipated d/c date is: > 3 days              Patient currently is not medically stable to d/c.  Consultants:   None  Procedures:   None  Antimicrobials:  Remdesivir   Subjective: Shortness of breath stable, slightly less fatigue, still constantly dyspneic even at rest. No chest pain or leg swelling.   Objective: Vitals:   04/21/20 0338 04/21/20 0800 04/21/20 0819 04/21/20 1500  BP: (!) 153/91  (!) 146/100   Pulse: (!) 109  (!) 106   Resp:   (!) 27   Temp: 98.8 F (37.1 C)  98.9 F (37.2 C)   TempSrc:      SpO2: 94% 90% (!) 89% 91%  Weight:      Height:        Intake/Output Summary (Last 24 hours) at 04/21/2020 1559 Last data filed at 04/20/2020 2100 Gross per 24 hour  Intake 570 ml  Output 300 ml  Net 270 ml   Filed Weights   04/20/20 1209  Weight: 132.9 kg    Gen: 34 y.o. female in no distress  Pulm: Non-labored tachypnea, Crackles.  CV: Regular tachycardia. No murmur, rub, or gallop. No JVD, no pedal edema. GI: Abdomen soft, non-tender, non-distended, with normoactive bowel sounds. No organomegaly or masses felt. Ext: Warm, no deformities Skin: No rashes,  lesions or ulcers Neuro: Alert and oriented. No focal neurological deficits. Psych: Judgement and insight appear normal. Mood & affect appropriate.   Data Reviewed: I have personally reviewed following labs and imaging studies  CBC: Recent Labs  Lab 04/20/20 1210 04/21/20 0559  WBC 9.0 7.7  NEUTROABS 7.9* 6.6  HGB 11.9* 11.5*  HCT 36.9 36.7  MCV 78.8* 79.6*  PLT 277 621   Basic Metabolic Panel: Recent Labs  Lab 04/20/20 1210 04/20/20 1510 04/21/20 0559  NA 132*  --  138  K 3.0*  --  3.8  CL 96*  --  103  CO2 25  --  25  GLUCOSE 120*  --  125*  BUN <5*  --  11  CREATININE 0.69  --   0.63  CALCIUM 8.3*  --  8.6*  MG  --  2.0  --    GFR: Estimated Creatinine Clearance: 134.1 mL/min (by C-G formula based on SCr of 0.63 mg/dL). Liver Function Tests: Recent Labs  Lab 04/20/20 1210 04/21/20 0559  AST 27 23  ALT 19 19  ALKPHOS 41 40  BILITOT 0.6 0.6  PROT 8.3* 7.6  ALBUMIN 3.5 3.2*   No results for input(s): LIPASE, AMYLASE in the last 168 hours. No results for input(s): AMMONIA in the last 168 hours. Coagulation Profile: Recent Labs  Lab 04/20/20 1210  INR 1.1   Cardiac Enzymes: No results for input(s): CKTOTAL, CKMB, CKMBINDEX, TROPONINI in the last 168 hours. BNP (last 3 results) No results for input(s): PROBNP in the last 8760 hours. HbA1C: No results for input(s): HGBA1C in the last 72 hours. CBG: No results for input(s): GLUCAP in the last 168 hours. Lipid Profile: Recent Labs    04/20/20 1232  TRIG 62   Thyroid Function Tests: No results for input(s): TSH, T4TOTAL, FREET4, T3FREE, THYROIDAB in the last 72 hours. Anemia Panel: Recent Labs    04/20/20 1232 04/21/20 0559  FERRITIN 324* 365*   Urine analysis:    Component Value Date/Time   COLORURINE PINK (A) 04/20/2020 1245   APPEARANCEUR HAZY (A) 04/20/2020 1245   LABSPEC 1.002 (L) 04/20/2020 1245   PHURINE 6.0 04/20/2020 1245   GLUCOSEU NEGATIVE 04/20/2020 1245   HGBUR MODERATE (A) 04/20/2020 1245   BILIRUBINUR NEGATIVE 04/20/2020 1245   BILIRUBINUR negative 10/01/2019 1218   KETONESUR 5 (A) 04/20/2020 1245   PROTEINUR 100 (A) 04/20/2020 1245   UROBILINOGEN 0.2 10/01/2019 1218   UROBILINOGEN 1.0 03/26/2015 1053   NITRITE NEGATIVE 04/20/2020 1245   LEUKOCYTESUR SMALL (A) 04/20/2020 1245   Recent Results (from the past 240 hour(s))  Novel Coronavirus, NAA ([facility])     Status: Abnormal   Collection Time: 04/13/20  3:22 PM   Specimen: Nasal Swab; Nasopharyngeal(NP) swabs in vial transport medium   Nasopharynge  Patient  Result Value Ref Range Status   SARS-CoV-2, NAA Detected  (A) Not Detected Final    Comment: Patients who have a positive COVID-19 test result may now have treatment options. Treatment options are available for patients with mild to moderate symptoms and for hospitalized patients. Visit our website at [email or web address] for resources and information. This nucleic acid amplification test was developed and its performance characteristics determined by [employer]. Nucleic acid amplification tests include RT-PCR and TMA. This test has not been FDA cleared or approved. This test has been authorized by FDA under an Emergency Use Authorization (EUA). This test is only authorized for the duration of time the declaration that circumstances exist justifying the authorization of  the emergency use of in vitro diagnostic tests for detection of SARS-CoV-2 virus and/or diagnosis of COVID-19 infection under section 564(b)(1) of the Act, 21 U.S.C. 662HUT-6(L) (1), unless the authorization is terminated or revoked sooner. When diagnostic testing is negativ e, the possibility of a false negative result should be considered in the context of a patient's recent exposures and the presence of clinical signs and symptoms consistent with COVID-19. An individual without symptoms of COVID-19 and who is not shedding SARS-CoV-2 virus would expect to have a negative (not detected) result in this assay.   SARS-COV-2, NAA 2 DAY TAT     Status: None   Collection Time: 04/13/20  3:22 PM   Nasopharynge  Patient  Result Value Ref Range Status   SARS-CoV-2, NAA 2 DAY TAT Performed  Final  Culture, blood (Routine x 2)     Status: None (Preliminary result)   Collection Time: 04/20/20 12:00 PM   Specimen: BLOOD  Result Value Ref Range Status   Specimen Description   Final    BLOOD LEFT ANTECUBITAL Performed at [facility], [place] [address], [place], [place] [address]    Special Requests   Final    BOTTLES DRAWN AEROBIC AND ANAEROBIC Blood Culture  adequate volume Performed at [facility] [address] [place], [place] [address]    Culture  Setup Time   Final    GRAM POSITIVE COCCI IN CLUSTERS ANAEROBIC BOTTLE ONLY CRITICAL RESULT CALLED TO, READ BACK BY AND VERIFIED WITH: Champ Mungo [id number] AT 1245 BY CM Performed at [facility], [place] [address] [place], [place] [address]    Culture GRAM POSITIVE COCCI IN CLUSTERS  Final   Report Status PENDING  Incomplete  Blood Culture ID Panel (Reflexed)     Status: Abnormal   Collection Time: 04/20/20 12:00 PM  Result Value Ref Range Status   Enterococcus faecalis NOT DETECTED NOT DETECTED Final   Enterococcus Faecium NOT DETECTED NOT DETECTED Final   Listeria monocytogenes NOT DETECTED NOT DETECTED Final   Staphylococcus species DETECTED (A) NOT DETECTED Final    Comment: CRITICAL RESULT CALLED TO, READ BACK BY AND VERIFIED WITH: pharmd m swayne [id number] at 1245 by cm    Staphylococcus aureus (BCID) NOT DETECTED NOT DETECTED Final   Staphylococcus epidermidis DETECTED (A) NOT DETECTED Final    Comment: CRITICAL RESULT CALLED TO, READ BACK BY AND VERIFIED WITH: PHARMD M SWAYNE [id number] AT 1245 BY CM    Staphylococcus lugdunensis NOT DETECTED NOT DETECTED Final   Streptococcus species NOT DETECTED NOT DETECTED Final   Streptococcus agalactiae NOT DETECTED NOT DETECTED Final   Streptococcus pneumoniae NOT DETECTED NOT DETECTED Final   Streptococcus pyogenes NOT DETECTED NOT DETECTED Final   A.calcoaceticus-baumannii NOT DETECTED NOT DETECTED Final   Bacteroides fragilis NOT DETECTED NOT DETECTED Final   Enterobacterales NOT DETECTED NOT DETECTED Final   Enterobacter cloacae complex NOT DETECTED NOT DETECTED Final   Escherichia coli NOT DETECTED NOT DETECTED Final   Klebsiella aerogenes NOT DETECTED NOT DETECTED Final   Klebsiella oxytoca NOT DETECTED NOT DETECTED Final   Klebsiella pneumoniae NOT DETECTED NOT DETECTED Final   Proteus species NOT DETECTED  NOT DETECTED Final   Salmonella species NOT DETECTED NOT DETECTED Final   Serratia marcescens NOT DETECTED NOT DETECTED Final   Haemophilus influenzae NOT DETECTED NOT DETECTED Final   Neisseria meningitidis NOT DETECTED NOT DETECTED Final   Pseudomonas aeruginosa NOT DETECTED NOT DETECTED Final   Stenotrophomonas maltophilia  NOT DETECTED NOT DETECTED Final   Candida albicans NOT DETECTED NOT DETECTED Final   Candida auris NOT DETECTED NOT DETECTED Final   Candida glabrata NOT DETECTED NOT DETECTED Final   Candida krusei NOT DETECTED NOT DETECTED Final   Candida parapsilosis NOT DETECTED NOT DETECTED Final   Candida tropicalis NOT DETECTED NOT DETECTED Final   Cryptococcus neoformans/gattii NOT DETECTED NOT DETECTED Final   Methicillin resistance mecA/C NOT DETECTED NOT DETECTED Final    Comment: Performed at [facility], [place] [address] [place], [place] [address]  Blood Culture (routine x 2)     Status: None (Preliminary result)   Collection Time: 04/20/20 12:30 PM   Specimen: BLOOD  Result Value Ref Range Status   Specimen Description   Final    BLOOD RIGHT ANTECUBITAL Performed at [facility] [address], [place], [place] [address]    Special Requests   Final    BOTTLES DRAWN AEROBIC AND ANAEROBIC Blood Culture adequate volume Performed at [facility] [address], [place], [place] [address]    Culture   Final    NO GROWTH < 24 HOURS Performed at [facility] [address] [place], [place] [address]    Report Status PENDING  Incomplete      Radiology Studies: DG Chest Port 1 View  Result Date: 04/20/2020 CLINICAL DATA:  Shortness of breath, headache and fever. EXAM: PORTABLE CHEST 1 VIEW COMPARISON:  11/07/2013 chest radiograph and prior. FINDINGS: Hypoinflated lungs. Patchy bilateral pulmonary airspace opacities. No pneumothorax or pleural effusion. Cardiomediastinal silhouette within normal limits. No acute  osseous abnormality. IMPRESSION: Multifocal pneumonia. Electronically Signed   By: Primitivo Gauze M.D.   On: 04/20/2020 12:10    Scheduled Meds: . amLODipine  10 mg Oral Daily  . baricitinib  4 mg Oral Daily  . dexamethasone (DECADRON) injection  6 mg Intravenous Daily  . enoxaparin (LOVENOX) injection  60 mg Subcutaneous Q24H  . Ipratropium-Albuterol  1 puff Inhalation Q6H  . pantoprazole  40 mg Oral Daily   Continuous Infusions: . remdesivir 100 mg in NS 100 mL 100 mg (04/21/20 1059)     LOS: 1 day   Time spent: 35 minutes.  Patrecia Pour, MD [facility] [email or web address] 04/21/2020, 3:59 PM

## 2020-04-21 NOTE — Progress Notes (Signed)
PHARMACY - PHYSICIAN COMMUNICATION CRITICAL VALUE ALERT - BLOOD CULTURE IDENTIFICATION (BCID)  [name] is an 34 y.o. female who presented to [facility] on 04/20/2020 with a chief complaint of COVID PNA  Assessment: Pt admitted with COVID PNA+, BCID + 1/4 GPC in clusters; ID staphylococcus epidermidis, no resistance detected.  -PCT <0.10  Name of physician (or Provider) Contacted: Dr. Bonner Puna via text page  Current antibiotics: None. On remdesivir for COVID PNA.  Changes to prescribed antibiotics recommended: None. Likely contaminant. If believed to be pathogenic, recommend cefazolin.  Results for orders placed or performed during the hospital encounter of 04/20/20  Blood Culture ID Panel (Reflexed) (Collected: 04/20/2020 12:00 PM)  Result Value Ref Range   Enterococcus faecalis NOT DETECTED NOT DETECTED   Enterococcus Faecium NOT DETECTED NOT DETECTED   Listeria monocytogenes NOT DETECTED NOT DETECTED   Staphylococcus species DETECTED (A) NOT DETECTED   Staphylococcus aureus (BCID) NOT DETECTED NOT DETECTED   Staphylococcus epidermidis DETECTED (A) NOT DETECTED   Staphylococcus lugdunensis NOT DETECTED NOT DETECTED   Streptococcus species NOT DETECTED NOT DETECTED   Streptococcus agalactiae NOT DETECTED NOT DETECTED   Streptococcus pneumoniae NOT DETECTED NOT DETECTED   Streptococcus pyogenes NOT DETECTED NOT DETECTED   A.calcoaceticus-baumannii NOT DETECTED NOT DETECTED   Bacteroides fragilis NOT DETECTED NOT DETECTED   Enterobacterales NOT DETECTED NOT DETECTED   Enterobacter cloacae complex NOT DETECTED NOT DETECTED   Escherichia coli NOT DETECTED NOT DETECTED   Klebsiella aerogenes NOT DETECTED NOT DETECTED   Klebsiella oxytoca NOT DETECTED NOT DETECTED   Klebsiella pneumoniae NOT DETECTED NOT DETECTED   Proteus species NOT DETECTED NOT DETECTED   Salmonella species NOT DETECTED NOT DETECTED   Serratia marcescens NOT DETECTED NOT DETECTED   Haemophilus influenzae  NOT DETECTED NOT DETECTED   Neisseria meningitidis NOT DETECTED NOT DETECTED   Pseudomonas aeruginosa NOT DETECTED NOT DETECTED   Stenotrophomonas maltophilia NOT DETECTED NOT DETECTED   Candida albicans NOT DETECTED NOT DETECTED   Candida auris NOT DETECTED NOT DETECTED   Candida glabrata NOT DETECTED NOT DETECTED   Candida krusei NOT DETECTED NOT DETECTED   Candida parapsilosis NOT DETECTED NOT DETECTED   Candida tropicalis NOT DETECTED NOT DETECTED   Cryptococcus neoformans/gattii NOT DETECTED NOT DETECTED   Methicillin resistance mecA/C NOT DETECTED NOT DETECTED    Lenis Noon, PharmD 04/21/2020  12:49 PM

## 2020-04-22 LAB — CBC WITH DIFFERENTIAL/PLATELET
Abs Immature Granulocytes: 0.1 10*3/uL — ABNORMAL HIGH (ref 0.00–0.07)
Basophils Absolute: 0 10*3/uL (ref 0.0–0.1)
Basophils Relative: 0 %
Eosinophils Absolute: 0 10*3/uL (ref 0.0–0.5)
Eosinophils Relative: 0 %
HCT: 37 % (ref 36.0–46.0)
Hemoglobin: 11.7 g/dL — ABNORMAL LOW (ref 12.0–15.0)
Immature Granulocytes: 1 %
Lymphocytes Relative: 14 %
Lymphs Abs: 1.3 10*3/uL (ref 0.7–4.0)
MCH: 25.3 pg — ABNORMAL LOW (ref 26.0–34.0)
MCHC: 31.6 g/dL (ref 30.0–36.0)
MCV: 79.9 fL — ABNORMAL LOW (ref 80.0–100.0)
Monocytes Absolute: 0.3 10*3/uL (ref 0.1–1.0)
Monocytes Relative: 3 %
Neutro Abs: 7.3 10*3/uL (ref 1.7–7.7)
Neutrophils Relative %: 82 %
Platelets: 489 10*3/uL — ABNORMAL HIGH (ref 150–400)
RBC: 4.63 MIL/uL (ref 3.87–5.11)
WBC: 9 10*3/uL (ref 4.0–10.5)
nRBC: 0 % (ref 0.0–0.2)

## 2020-04-22 LAB — COMPREHENSIVE METABOLIC PANEL
ALT: 19 U/L (ref 0–44)
AST: 22 U/L (ref 15–41)
Albumin: 3.1 g/dL — ABNORMAL LOW (ref 3.5–5.0)
Alkaline Phosphatase: 40 U/L (ref 38–126)
Anion gap: 10 (ref 5–15)
BUN: 15 mg/dL (ref 6–20)
CO2: 24 mmol/L (ref 22–32)
Calcium: 8.7 mg/dL — ABNORMAL LOW (ref 8.9–10.3)
Chloride: 101 mmol/L (ref 98–111)
Creatinine, Ser: 0.65 mg/dL (ref 0.44–1.00)
GFR, Estimated: >60 mL/min (ref >60)
Glucose, Bld: 132 mg/dL — ABNORMAL HIGH (ref 70–99)
Potassium: 3.7 mmol/L (ref 3.5–5.1)
Sodium: 135 mmol/L (ref 135–145)
Total Bilirubin: 0.4 mg/dL (ref 0.3–1.2)
Total Protein: 7.4 g/dL (ref 6.5–8.1)

## 2020-04-22 LAB — D-DIMER, QUANTITATIVE: D-Dimer, Quant: 0.95 ug/mL-FEU — ABNORMAL HIGH (ref 0.00–0.50)

## 2020-04-22 LAB — C-REACTIVE PROTEIN: CRP: 4.8 mg/dL — ABNORMAL HIGH (ref <1.0)

## 2020-04-22 MED ORDER — SALINE SPRAY 0.65 % NA SOLN
1.0000 | NASAL | Status: DC | PRN
  Administered 2020-04-22: 1 via NASAL
  Filled 2020-04-22 (×2): qty 44

## 2020-04-22 NOTE — Progress Notes (Signed)
PROGRESS NOTE  [name]  TFT:[id number] DOB: 01-22-1986 DOA: 04/20/2020 PCP: [name], PA   Brief Narrative: [name] is a 34 y.o. female with a history of morbid obesity, HTN, GERD, and covid-19 infection diagnosed 11/5 who presented to the ED 11/12 with worsening dyspnea found to be severely hypoxic requiring 15L O2, febrile to 101.85F, tachycardic, tachypneic, and hypertensive with CXR confirming multifocal pneumonia. CRP 16. Remdesivir, steroids, and baricitinib were given on admission for acute hypoxic respiratory failure due to covid-19 pneumonia.   Assessment & Plan: Principal Problem:   Acute hypoxemic respiratory failure due to COVID-19 [facility]) Active Problems:   Hypertension   Acid reflux   Obesity, Class III, BMI 40-49.9 (morbid obesity) ([place])  Acute hypoxemic respiratory failure due to covid-19 pneumonia: SARS-CoV-2 positive on 11/5.  - Continue remdesivir x5 days (11/12 - 11/16) - Continue solumedrol. CRP improving, still on high level oxygen supplementation.  - Continue baricitinib. Will have to monitor Cr, LFTs, blood counts, and blood cultures. With negative PCT and Cx being presumed contaminant, will continue current management. - Encourage OOB, IS, FV, and awake proning if able - Continue airborne, contact precautions for 21 days from positive testing. - Monitor CMP and inflammatory markers - Enoxaparin prophylactic dose.   Microcytic anemia:  - iron studies in AM.  HTN:  - Continue norvasc 10mg , continue metoprolol prn  S. epidermidis in 1 of 4 blood culture bottles: Consistent with contaminant at this time.  - Continue monitoring.   GERD:  - Continue PPI  Menstrual cramps: hCG neg. - Ibuprofen prn since tylenol ineffective.   Morbid obesity: Estimated body mass index is 48.76 kg/m as calculated from the following:   Height as of this encounter: 5\' 5"  (1.651 m).   Weight as of this encounter: 132.9 kg.  DVT prophylaxis:  Lovenox Code Status: Full Family Communication: None at bedside, did not request any calls. Disposition Plan:  Status is: Inpatient - Remain in PCU  Remains inpatient appropriate because:Inpatient level of care appropriate due to severity of illness  Dispo: The patient is from: Home              Anticipated d/c is to: Home              Anticipated d/c date is: > 3 days              Patient currently is not medically stable to d/c.  Consultants:   None  Procedures:   None  Antimicrobials:  Remdesivir   Subjective: Dyspneic on exertion severely but improved to the point she can get up to the bathroom. +cough, using IS. No chest pain or leg swelling. Uterine cramping improved with ibuprofen.  Objective: Vitals:   04/21/20 2134 04/22/20 0416 04/22/20 0800 04/22/20 1216  BP: (!) 136/96 (!) 145/95  (!) 153/97  Pulse: (!) 108 (!) 101  (!) 108  Resp: 20 20    Temp: 98.2 F (36.8 C)   98.3 F (36.8 C)  TempSrc: Oral     SpO2: 92% 90% 91% 90%  Weight:      Height:        Intake/Output Summary (Last 24 hours) at 04/22/2020 1427 Last data filed at 04/21/2020 2100 Gross per 24 hour  Intake 550 ml  Output --  Net 550 ml   Filed Weights   04/20/20 1209  Weight: 132.9 kg   Gen: 34 y.o. female in no distress Pulm: Nonlabored but tachypneic at rest, crackles bilaterally  CV: Regular tachycardia. No murmur, rub, or gallop. No JVD, no dependent edema. GI: Abdomen soft, non-tender, non-distended, with normoactive bowel sounds.  Ext: Warm, no deformities Skin: No rashes, lesions or ulcers on visualized skin. Neuro: Alert and oriented. No focal neurological deficits. Psych: Judgement and insight appear fair. Mood euthymic & affect congruent. Behavior is appropriate.    Data Reviewed: I have personally reviewed following labs and imaging studies  CBC: Recent Labs  Lab 04/20/20 1210 04/21/20 0559 04/22/20 0512  WBC 9.0 7.7 9.0  NEUTROABS 7.9* 6.6 7.3  HGB 11.9* 11.5*  11.7*  HCT 36.9 36.7 37.0  MCV 78.8* 79.6* 79.9*  PLT 277 353 465*   Basic Metabolic Panel: Recent Labs  Lab 04/20/20 1210 04/20/20 1510 04/21/20 0559 04/22/20 0512  NA 132*  --  138 135  K 3.0*  --  3.8 3.7  CL 96*  --  103 101  CO2 25  --  25 24  GLUCOSE 120*  --  125* 132*  BUN <5*  --  11 15  CREATININE 0.69  --  0.63 0.65  CALCIUM 8.3*  --  8.6* 8.7*  MG  --  2.0  --   --    GFR: Estimated Creatinine Clearance: 134.1 mL/min (by C-G formula based on SCr of 0.65 mg/dL). Liver Function Tests: Recent Labs  Lab 04/20/20 1210 04/21/20 0559 04/22/20 0512  AST 27 23 22   ALT 19 19 19   ALKPHOS 41 40 40  BILITOT 0.6 0.6 0.4  PROT 8.3* 7.6 7.4  ALBUMIN 3.5 3.2* 3.1*   No results for input(s): LIPASE, AMYLASE in the last 168 hours. No results for input(s): AMMONIA in the last 168 hours. Coagulation Profile: Recent Labs  Lab 04/20/20 1210  INR 1.1   Cardiac Enzymes: No results for input(s): CKTOTAL, CKMB, CKMBINDEX, TROPONINI in the last 168 hours. BNP (last 3 results) No results for input(s): PROBNP in the last 8760 hours. HbA1C: No results for input(s): HGBA1C in the last 72 hours. CBG: No results for input(s): GLUCAP in the last 168 hours. Lipid Profile: Recent Labs    04/20/20 1232  TRIG 62   Thyroid Function Tests: No results for input(s): TSH, T4TOTAL, FREET4, T3FREE, THYROIDAB in the last 72 hours. Anemia Panel: Recent Labs    04/20/20 1232 04/21/20 0559  FERRITIN 324* 365*   Urine analysis:    Component Value Date/Time   COLORURINE PINK (A) 04/20/2020 1245   APPEARANCEUR HAZY (A) 04/20/2020 1245   LABSPEC 1.002 (L) 04/20/2020 1245   PHURINE 6.0 04/20/2020 1245   GLUCOSEU NEGATIVE 04/20/2020 1245   HGBUR MODERATE (A) 04/20/2020 1245   BILIRUBINUR NEGATIVE 04/20/2020 1245   BILIRUBINUR negative 10/01/2019 1218   KETONESUR 5 (A) 04/20/2020 1245   PROTEINUR 100 (A) 04/20/2020 1245   UROBILINOGEN 0.2 10/01/2019 1218   UROBILINOGEN 1.0  03/26/2015 1053   NITRITE NEGATIVE 04/20/2020 1245   LEUKOCYTESUR SMALL (A) 04/20/2020 1245   Recent Results (from the past 240 hour(s))  Novel Coronavirus, NAA ([facility])     Status: Abnormal   Collection Time: 04/13/20  3:22 PM   Specimen: Nasal Swab; Nasopharyngeal(NP) swabs in vial transport medium   Nasopharynge  Patient  Result Value Ref Range Status   SARS-CoV-2, NAA Detected (A) Not Detected Final    Comment: Patients who have a positive COVID-19 test result may now have treatment options. Treatment options are available for patients with mild to moderate symptoms and for hospitalized patients. Visit our website at [email or web address] for  resources and information. This nucleic acid amplification test was developed and its performance characteristics determined by [employer]. Nucleic acid amplification tests include RT-PCR and TMA. This test has not been FDA cleared or approved. This test has been authorized by FDA under an Emergency Use Authorization (EUA). This test is only authorized for the duration of time the declaration that circumstances exist justifying the authorization of the emergency use of in vitro diagnostic tests for detection of SARS-CoV-2 virus and/or diagnosis of COVID-19 infection under section 564(b)(1) of the Act, 21 U.S.C. 914NWG-9(F) (1), unless the authorization is terminated or revoked sooner. When diagnostic testing is negativ e, the possibility of a false negative result should be considered in the context of a patient's recent exposures and the presence of clinical signs and symptoms consistent with COVID-19. An individual without symptoms of COVID-19 and who is not shedding SARS-CoV-2 virus would expect to have a negative (not detected) result in this assay.   SARS-COV-2, NAA 2 DAY TAT     Status: None   Collection Time: 04/13/20  3:22 PM   Nasopharynge  Patient  Result Value Ref Range Status   SARS-CoV-2, NAA 2 DAY TAT  Performed  Final  Culture, blood (Routine x 2)     Status: Abnormal (Preliminary result)   Collection Time: 04/20/20 12:00 PM   Specimen: BLOOD  Result Value Ref Range Status   Specimen Description   Final    BLOOD LEFT ANTECUBITAL Performed at [facility], [place] [address] [place], [place] [address]    Special Requests   Final    BOTTLES DRAWN AEROBIC AND ANAEROBIC Blood Culture adequate volume Performed at [place] [address], [place], [place] [address]    Culture  Setup Time   Final    GRAM POSITIVE COCCI IN CLUSTERS ANAEROBIC BOTTLE ONLY CRITICAL RESULT CALLED TO, READ BACK BY AND VERIFIED WITH: PHARMD M SWAYNE [id number] AT 1245 BY CM    Culture (A)  Final    STAPHYLOCOCCUS EPIDERMIDIS THE SIGNIFICANCE OF ISOLATING THIS ORGANISM FROM A SINGLE SET OF BLOOD CULTURES WHEN MULTIPLE SETS ARE DRAWN IS UNCERTAIN. PLEASE NOTIFY THE MICROBIOLOGY DEPARTMENT WITHIN ONE WEEK IF SPECIATION AND SENSITIVITIES ARE REQUIRED. Performed at [facility], [place] [address], [place], [place] [address]    Report Status PENDING  Incomplete  Blood Culture ID Panel (Reflexed)     Status: Abnormal   Collection Time: 04/20/20 12:00 PM  Result Value Ref Range Status   Enterococcus faecalis NOT DETECTED NOT DETECTED Final   Enterococcus Faecium NOT DETECTED NOT DETECTED Final   Listeria monocytogenes NOT DETECTED NOT DETECTED Final   Staphylococcus species DETECTED (A) NOT DETECTED Final    Comment: CRITICAL RESULT CALLED TO, READ BACK BY AND VERIFIED WITH: pharmd m swayne [id number] at 1245 by cm    Staphylococcus aureus (BCID) NOT DETECTED NOT DETECTED Final   Staphylococcus epidermidis DETECTED (A) NOT DETECTED Final    Comment: CRITICAL RESULT CALLED TO, READ BACK BY AND VERIFIED WITH: PHARMD M SWAYNE [id number] AT 1245 BY CM    Staphylococcus lugdunensis NOT DETECTED NOT DETECTED Final   Streptococcus species NOT DETECTED NOT DETECTED Final   Streptococcus agalactiae NOT  DETECTED NOT DETECTED Final   Streptococcus pneumoniae NOT DETECTED NOT DETECTED Final   Streptococcus pyogenes NOT DETECTED NOT DETECTED Final   A.calcoaceticus-baumannii NOT DETECTED NOT DETECTED Final   Bacteroides fragilis NOT DETECTED NOT DETECTED Final   Enterobacterales NOT DETECTED NOT DETECTED Final  Enterobacter cloacae complex NOT DETECTED NOT DETECTED Final   Escherichia coli NOT DETECTED NOT DETECTED Final   Klebsiella aerogenes NOT DETECTED NOT DETECTED Final   Klebsiella oxytoca NOT DETECTED NOT DETECTED Final   Klebsiella pneumoniae NOT DETECTED NOT DETECTED Final   Proteus species NOT DETECTED NOT DETECTED Final   Salmonella species NOT DETECTED NOT DETECTED Final   Serratia marcescens NOT DETECTED NOT DETECTED Final   Haemophilus influenzae NOT DETECTED NOT DETECTED Final   Neisseria meningitidis NOT DETECTED NOT DETECTED Final   Pseudomonas aeruginosa NOT DETECTED NOT DETECTED Final   Stenotrophomonas maltophilia NOT DETECTED NOT DETECTED Final   Candida albicans NOT DETECTED NOT DETECTED Final   Candida auris NOT DETECTED NOT DETECTED Final   Candida glabrata NOT DETECTED NOT DETECTED Final   Candida krusei NOT DETECTED NOT DETECTED Final   Candida parapsilosis NOT DETECTED NOT DETECTED Final   Candida tropicalis NOT DETECTED NOT DETECTED Final   Cryptococcus neoformans/gattii NOT DETECTED NOT DETECTED Final   Methicillin resistance mecA/C NOT DETECTED NOT DETECTED Final    Comment: Performed at [facility], [place] [address] [place], [place] [address]  Blood Culture (routine x 2)     Status: None (Preliminary result)   Collection Time: 04/20/20 12:30 PM   Specimen: BLOOD  Result Value Ref Range Status   Specimen Description   Final    BLOOD RIGHT ANTECUBITAL Performed at [place] [address] [place], [place] [address]    Special Requests   Final    BOTTLES DRAWN AEROBIC AND ANAEROBIC Blood Culture adequate volume Performed at  [facility] [address], [place], [place] [address]    Culture   Final    NO GROWTH 2 DAYS Performed at [facility] [address], [place], [place] [address]    Report Status PENDING  Incomplete      Radiology Studies: No results found.  Scheduled Meds: . amLODipine  10 mg Oral Daily  . baricitinib  4 mg Oral Daily  . enoxaparin (LOVENOX) injection  60 mg Subcutaneous Q24H  . Ipratropium-Albuterol  1 puff Inhalation Q6H  . methylPREDNISolone (SOLU-MEDROL) injection  80 mg Intravenous Q12H  . pantoprazole  40 mg Oral Daily   Continuous Infusions: . remdesivir 100 mg in NS 100 mL 100 mg (04/22/20 0922)     LOS: 2 days   Time spent: 35 minutes.  Patrecia Pour, MD [facility] [email or web address] 04/22/2020, 2:27 PM

## 2020-04-23 LAB — VITAMIN B12: Vitamin B-12: 488 pg/mL (ref 180–914)

## 2020-04-23 LAB — CBC WITH DIFFERENTIAL/PLATELET
Abs Immature Granulocytes: 0.41 10*3/uL — ABNORMAL HIGH (ref 0.00–0.07)
Basophils Absolute: 0 10*3/uL (ref 0.0–0.1)
Basophils Relative: 0 %
Eosinophils Absolute: 0 10*3/uL (ref 0.0–0.5)
Eosinophils Relative: 0 %
HCT: 36.6 % (ref 36.0–46.0)
Hemoglobin: 11.6 g/dL — ABNORMAL LOW (ref 12.0–15.0)
Immature Granulocytes: 4 %
Lymphocytes Relative: 14 %
Lymphs Abs: 1.6 10*3/uL (ref 0.7–4.0)
MCH: 25 pg — ABNORMAL LOW (ref 26.0–34.0)
MCHC: 31.7 g/dL (ref 30.0–36.0)
MCV: 78.9 fL — ABNORMAL LOW (ref 80.0–100.0)
Monocytes Absolute: 0.7 10*3/uL (ref 0.1–1.0)
Monocytes Relative: 6 %
Neutro Abs: 8.3 10*3/uL — ABNORMAL HIGH (ref 1.7–7.7)
Neutrophils Relative %: 76 %
Platelets: 569 10*3/uL — ABNORMAL HIGH (ref 150–400)
RBC: 4.64 MIL/uL (ref 3.87–5.11)
RDW: 23.9 % — ABNORMAL HIGH (ref 11.5–15.5)
WBC: 11 10*3/uL — ABNORMAL HIGH (ref 4.0–10.5)
nRBC: 0 % (ref 0.0–0.2)

## 2020-04-23 LAB — COMPREHENSIVE METABOLIC PANEL
ALT: 20 U/L (ref 0–44)
AST: 23 U/L (ref 15–41)
Albumin: 3 g/dL — ABNORMAL LOW (ref 3.5–5.0)
Alkaline Phosphatase: 41 U/L (ref 38–126)
Anion gap: 9 (ref 5–15)
BUN: 18 mg/dL (ref 6–20)
CO2: 25 mmol/L (ref 22–32)
Calcium: 8.7 mg/dL — ABNORMAL LOW (ref 8.9–10.3)
Chloride: 102 mmol/L (ref 98–111)
Creatinine, Ser: 0.66 mg/dL (ref 0.44–1.00)
GFR, Estimated: >60 mL/min (ref >60)
Glucose, Bld: 134 mg/dL — ABNORMAL HIGH (ref 70–99)
Potassium: 3.6 mmol/L (ref 3.5–5.1)
Sodium: 136 mmol/L (ref 135–145)
Total Bilirubin: 0.3 mg/dL (ref 0.3–1.2)
Total Protein: 7.2 g/dL (ref 6.5–8.1)

## 2020-04-23 LAB — IRON AND TIBC
Iron: 62 ug/dL (ref 28–170)
Saturation Ratios: 29 % (ref 10.4–31.8)
TIBC: 216 ug/dL — ABNORMAL LOW (ref 250–450)
UIBC: 154 ug/dL

## 2020-04-23 LAB — RETICULOCYTES
Immature Retic Fract: 17.3 % — ABNORMAL HIGH (ref 2.3–15.9)
RBC.: 4.54 MIL/uL (ref 3.87–5.11)
Retic Count, Absolute: 42.7 10*3/uL (ref 19.0–186.0)
Retic Ct Pct: 0.9 % (ref 0.4–3.1)

## 2020-04-23 LAB — FERRITIN: Ferritin: 329 ng/mL — ABNORMAL HIGH (ref 11–307)

## 2020-04-23 LAB — CULTURE, BLOOD (ROUTINE X 2): Special Requests: ADEQUATE

## 2020-04-23 LAB — C-REACTIVE PROTEIN: CRP: 1.6 mg/dL — ABNORMAL HIGH (ref <1.0)

## 2020-04-23 LAB — D-DIMER, QUANTITATIVE: D-Dimer, Quant: 1.27 ug/mL-FEU — ABNORMAL HIGH (ref 0.00–0.50)

## 2020-04-23 LAB — FOLATE: Folate: 8 ng/mL (ref >5.9)

## 2020-04-23 NOTE — Progress Notes (Addendum)
PROGRESS NOTE  [name]  JHE:[id number] DOB: [birth year] DOA: 04/20/2020 PCP: [name], PA   Brief Narrative: [name] is a 34 y.o. female with a history of morbid obesity, HTN, GERD, and covid-19 infection diagnosed 11/5 who presented to the ED 11/12 with worsening dyspnea found to be severely hypoxic requiring 15L O2, febrile to 101.58F, tachycardic, tachypneic, and hypertensive with CXR confirming multifocal pneumonia. CRP 16. Remdesivir, steroids, and baricitinib were given on admission for acute hypoxic respiratory failure due to covid-19 pneumonia.   Assessment & Plan: Principal Problem:   Acute hypoxemic respiratory failure due to COVID-19 [facility]) Active Problems:   Hypertension   Acid reflux   Obesity, Class III, BMI 40-49.9 (morbid obesity) ([place])  Acute hypoxemic respiratory failure due to covid-19 pneumonia: SARS-CoV-2 positive on 11/5.  - Continue remdesivir x5 days (11/12 - 11/16) - Continue solumedrol. CRP improving, still on high level oxygen supplementation. - Continue baricitinib. Will have to monitor Cr, LFTs, blood counts, and blood cultures.  - Encourage OOB, IS, FV, and awake proning if able - Continue airborne, contact precautions for 21 days from positive testing. - Monitor CMP and inflammatory markers - Enoxaparin prophylactic dose.   Microcytic anemia: Iron wnl, TIBC low and ferritin high. Suspect severe inflammation is cause of these findings.  - Recommend iron studies after discharge.   Hypokalemia:  - Supplemented.   HTN: At goal BP. - Continue norvasc 10mg , continue metoprolol prn  S. epidermidis in 1 of 4 blood culture bottles: Consistent with contaminant at this time.  - Continue monitoring.   GERD:  - Continue PPI  Menstrual cramps: hCG neg. - Ibuprofen prn since tylenol ineffective.   Morbid obesity: Estimated body mass index is 48.76 kg/m as calculated from the following:   Height as of this encounter: 5\' 5"  (1.651  m).   Weight as of this encounter: 132.9 kg.  DVT prophylaxis: Lovenox Code Status: Full Family Communication: None at bedside  Disposition Plan:  Status is: Inpatient - Remain in PCU  Remains inpatient appropriate because:Inpatient level of care appropriate due to severity of illness  Dispo: The patient is from: Home              Anticipated d/c is to: Home              Anticipated d/c date is: > 3 days              Patient currently is not medically stable to d/c.  Consultants:   None  Procedures:   None  Antimicrobials:  Remdesivir   Subjective: Still moderately, constantly short of breath improved with supplemental oxygen. No chest pain or other new complaints. Only able to transfer herself from bed to [facility] due to exertional dyspnea.   Objective: Vitals:   04/22/20 0800 04/22/20 1216 04/22/20 2012 04/23/20 0400  BP:  (!) 153/97 134/86 134/85  Pulse:  (!) 108 (!) 107 87  Resp:   20 20  Temp:  98.3 F (36.8 C) 98.5 F (36.9 C) 98.2 F (36.8 C)  TempSrc:   Oral   SpO2: 91% 90% 96% 96%  Weight:      Height:        Intake/Output Summary (Last 24 hours) at 04/23/2020 1134 Last data filed at 04/23/2020 0600 Gross per 24 hour  Intake 500 ml  Output 650 ml  Net -150 ml   Filed Weights   04/20/20 1209  Weight: 132.9 kg   Gen: 34 y.o. female  in no distress Pulm: Nonlabored breathing HFNC O2, crackles bilterally CV: Regular rate and rhythm. No murmur, rub, or gallop. No JVD, no dependent edema. GI: Abdomen soft, non-tender, non-distended, with normoactive bowel sounds.  Ext: Warm, no deformities Skin: No rashes, lesions or ulcers on visualized skin. Neuro: Alert and oriented. No focal neurological deficits. Psych: Judgement and insight appear fair. Mood euthymic & affect congruent. Behavior is appropriate.    Data Reviewed: I have personally reviewed following labs and imaging studies  CBC: Recent Labs  Lab 04/20/20 1210 04/21/20 0559 04/22/20 0512  04/23/20 0436  WBC 9.0 7.7 9.0 11.0*  NEUTROABS 7.9* 6.6 7.3 8.3*  HGB 11.9* 11.5* 11.7* 11.6*  HCT 36.9 36.7 37.0 36.6  MCV 78.8* 79.6* 79.9* 78.9*  PLT 277 353 489* 409*   Basic Metabolic Panel: Recent Labs  Lab 04/20/20 1210 04/20/20 1510 04/21/20 0559 04/22/20 0512 04/23/20 0436  NA 132*  --  138 135 136  K 3.0*  --  3.8 3.7 3.6  CL 96*  --  103 101 102  CO2 25  --  25 24 25   GLUCOSE 120*  --  125* 132* 134*  BUN <5*  --  11 15 18   CREATININE 0.69  --  0.63 0.65 0.66  CALCIUM 8.3*  --  8.6* 8.7* 8.7*  MG  --  2.0  --   --   --    GFR: Estimated Creatinine Clearance: 134.1 mL/min (by C-G formula based on SCr of 0.66 mg/dL). Liver Function Tests: Recent Labs  Lab 04/20/20 1210 04/21/20 0559 04/22/20 0512 04/23/20 0436  AST 27 23 22 23   ALT 19 19 19 20   ALKPHOS 41 40 40 41  BILITOT 0.6 0.6 0.4 0.3  PROT 8.3* 7.6 7.4 7.2  ALBUMIN 3.5 3.2* 3.1* 3.0*   Coagulation Profile: Recent Labs  Lab 04/20/20 1210  INR 1.1   Lipid Profile: Recent Labs    04/20/20 1232  TRIG 62   Anemia Panel: Recent Labs    04/21/20 0559 04/23/20 0436  VITAMINB12  --  488  FOLATE  --  8.0  FERRITIN 365* 329*  TIBC  --  216*  IRON  --  62  RETICCTPCT  --  0.9   Urine analysis:    Component Value Date/Time   COLORURINE PINK (A) 04/20/2020 1245   APPEARANCEUR HAZY (A) 04/20/2020 1245   LABSPEC 1.002 (L) 04/20/2020 1245   PHURINE 6.0 04/20/2020 1245   GLUCOSEU NEGATIVE 04/20/2020 1245   HGBUR MODERATE (A) 04/20/2020 1245   BILIRUBINUR NEGATIVE 04/20/2020 1245   BILIRUBINUR negative 10/01/2019 1218   KETONESUR 5 (A) 04/20/2020 1245   PROTEINUR 100 (A) 04/20/2020 1245   UROBILINOGEN 0.2 10/01/2019 1218   UROBILINOGEN 1.0 03/26/2015 1053   NITRITE NEGATIVE 04/20/2020 1245   LEUKOCYTESUR SMALL (A) 04/20/2020 1245   Recent Results (from the past 240 hour(s))  Novel Coronavirus, NAA ([facility])     Status: Abnormal   Collection Time: 04/13/20  3:22 PM   Specimen: Nasal  Swab; Nasopharyngeal(NP) swabs in vial transport medium   Nasopharynge  Patient  Result Value Ref Range Status   SARS-CoV-2, NAA Detected (A) Not Detected Final    Comment: Patients who have a positive COVID-19 test result may now have treatment options. Treatment options are available for patients with mild to moderate symptoms and for hospitalized patients. Visit our website at [email or web address] for resources and information. This nucleic acid amplification test was developed and its performance characteristics determined by [employer].  Nucleic acid amplification tests include RT-PCR and TMA. This test has not been FDA cleared or approved. This test has been authorized by FDA under an Emergency Use Authorization (EUA). This test is only authorized for the duration of time the declaration that circumstances exist justifying the authorization of the emergency use of in vitro diagnostic tests for detection of SARS-CoV-2 virus and/or diagnosis of COVID-19 infection under section 564(b)(1) of the Act, 21 U.S.C. 826EBR-8(X) (1), unless the authorization is terminated or revoked sooner. When diagnostic testing is negativ e, the possibility of a false negative result should be considered in the context of a patient's recent exposures and the presence of clinical signs and symptoms consistent with COVID-19. An individual without symptoms of COVID-19 and who is not shedding SARS-CoV-2 virus would expect to have a negative (not detected) result in this assay.   SARS-COV-2, NAA 2 DAY TAT     Status: None   Collection Time: 04/13/20  3:22 PM   Nasopharynge  Patient  Result Value Ref Range Status   SARS-CoV-2, NAA 2 DAY TAT Performed  Final  Culture, blood (Routine x 2)     Status: Abnormal   Collection Time: 04/20/20 12:00 PM   Specimen: BLOOD  Result Value Ref Range Status   Specimen Description   Final    BLOOD LEFT ANTECUBITAL Performed at [place], [place] [address], [place], [place] [address]    Special Requests   Final    BOTTLES DRAWN AEROBIC AND ANAEROBIC Blood Culture adequate volume Performed at [facility] [address], [place], [place] [address]    Culture  Setup Time   Final    GRAM POSITIVE COCCI IN CLUSTERS ANAEROBIC BOTTLE ONLY CRITICAL RESULT CALLED TO, READ BACK BY AND VERIFIED WITH: PHARMD M SWAYNE [id number] AT 1245 BY CM    Culture (A)  Final    STAPHYLOCOCCUS EPIDERMIDIS THE SIGNIFICANCE OF ISOLATING THIS ORGANISM FROM A SINGLE SET OF BLOOD CULTURES WHEN MULTIPLE SETS ARE DRAWN IS UNCERTAIN. PLEASE NOTIFY THE MICROBIOLOGY DEPARTMENT WITHIN ONE WEEK IF SPECIATION AND SENSITIVITIES ARE REQUIRED. Performed at [facility], [place] [address] [place], [place] [address]    Report Status 04/23/2020 FINAL  Final  Blood Culture ID Panel (Reflexed)     Status: Abnormal   Collection Time: 04/20/20 12:00 PM  Result Value Ref Range Status   Enterococcus faecalis NOT DETECTED NOT DETECTED Final   Enterococcus Faecium NOT DETECTED NOT DETECTED Final   Listeria monocytogenes NOT DETECTED NOT DETECTED Final   Staphylococcus species DETECTED (A) NOT DETECTED Final    Comment: CRITICAL RESULT CALLED TO, READ BACK BY AND VERIFIED WITH: pharmd m swayne [id number] at 1245 by cm    Staphylococcus aureus (BCID) NOT DETECTED NOT DETECTED Final   Staphylococcus epidermidis DETECTED (A) NOT DETECTED Final    Comment: CRITICAL RESULT CALLED TO, READ BACK BY AND VERIFIED WITH: PHARMD M SWAYNE [id number] AT 1245 BY CM    Staphylococcus lugdunensis NOT DETECTED NOT DETECTED Final   Streptococcus species NOT DETECTED NOT DETECTED Final   Streptococcus agalactiae NOT DETECTED NOT DETECTED Final   Streptococcus pneumoniae NOT DETECTED NOT DETECTED Final   Streptococcus pyogenes NOT DETECTED NOT DETECTED Final   A.calcoaceticus-baumannii NOT DETECTED NOT DETECTED Final   Bacteroides fragilis NOT DETECTED NOT DETECTED Final    Enterobacterales NOT DETECTED NOT DETECTED Final   Enterobacter cloacae complex NOT DETECTED NOT DETECTED Final   Escherichia coli NOT DETECTED NOT DETECTED Final  Klebsiella aerogenes NOT DETECTED NOT DETECTED Final   Klebsiella oxytoca NOT DETECTED NOT DETECTED Final   Klebsiella pneumoniae NOT DETECTED NOT DETECTED Final   Proteus species NOT DETECTED NOT DETECTED Final   Salmonella species NOT DETECTED NOT DETECTED Final   Serratia marcescens NOT DETECTED NOT DETECTED Final   Haemophilus influenzae NOT DETECTED NOT DETECTED Final   Neisseria meningitidis NOT DETECTED NOT DETECTED Final   Pseudomonas aeruginosa NOT DETECTED NOT DETECTED Final   Stenotrophomonas maltophilia NOT DETECTED NOT DETECTED Final   Candida albicans NOT DETECTED NOT DETECTED Final   Candida auris NOT DETECTED NOT DETECTED Final   Candida glabrata NOT DETECTED NOT DETECTED Final   Candida krusei NOT DETECTED NOT DETECTED Final   Candida parapsilosis NOT DETECTED NOT DETECTED Final   Candida tropicalis NOT DETECTED NOT DETECTED Final   Cryptococcus neoformans/gattii NOT DETECTED NOT DETECTED Final   Methicillin resistance mecA/C NOT DETECTED NOT DETECTED Final    Comment: Performed at [facility], [place] [address], [place], [place] [address]  Blood Culture (routine x 2)     Status: None (Preliminary result)   Collection Time: 04/20/20 12:30 PM   Specimen: BLOOD  Result Value Ref Range Status   Specimen Description   Final    BLOOD RIGHT ANTECUBITAL Performed at [facility] [address], [place], [place] [address]    Special Requests   Final    BOTTLES DRAWN AEROBIC AND ANAEROBIC Blood Culture adequate volume Performed at [facility] [address], [place], [place] [address]    Culture   Final    NO GROWTH 2 DAYS Performed at [facility] [address], [place], [place] [address]    Report Status PENDING  Incomplete      Radiology Studies: No  results found.  Scheduled Meds: . amLODipine  10 mg Oral Daily  . baricitinib  4 mg Oral Daily  . enoxaparin (LOVENOX) injection  60 mg Subcutaneous Q24H  . Ipratropium-Albuterol  1 puff Inhalation Q6H  . methylPREDNISolone (SOLU-MEDROL) injection  80 mg Intravenous Q12H  . pantoprazole  40 mg Oral Daily   Continuous Infusions: . remdesivir 100 mg in NS 100 mL Stopped (04/22/20 1005)     LOS: 3 days   Time spent: 35 minutes.  Patrecia Pour, MD [facility] [email or web address] 04/23/2020, 11:34 AM

## 2020-04-24 LAB — COMPREHENSIVE METABOLIC PANEL
ALT: 26 U/L (ref 0–44)
AST: 24 U/L (ref 15–41)
Albumin: 2.9 g/dL — ABNORMAL LOW (ref 3.5–5.0)
Alkaline Phosphatase: 38 U/L (ref 38–126)
Anion gap: 9 (ref 5–15)
BUN: 17 mg/dL (ref 6–20)
CO2: 26 mmol/L (ref 22–32)
Calcium: 8.5 mg/dL — ABNORMAL LOW (ref 8.9–10.3)
Chloride: 102 mmol/L (ref 98–111)
Creatinine, Ser: 0.71 mg/dL (ref 0.44–1.00)
GFR, Estimated: >60 mL/min (ref >60)
Glucose, Bld: 136 mg/dL — ABNORMAL HIGH (ref 70–99)
Potassium: 4.1 mmol/L (ref 3.5–5.1)
Sodium: 137 mmol/L (ref 135–145)
Total Bilirubin: 0.4 mg/dL (ref 0.3–1.2)
Total Protein: 6.7 g/dL (ref 6.5–8.1)

## 2020-04-24 LAB — CBC WITH DIFFERENTIAL/PLATELET
Abs Immature Granulocytes: 0.52 10*3/uL — ABNORMAL HIGH (ref 0.00–0.07)
Basophils Absolute: 0.1 10*3/uL (ref 0.0–0.1)
Basophils Relative: 1 %
Eosinophils Absolute: 0 10*3/uL (ref 0.0–0.5)
Eosinophils Relative: 0 %
HCT: 37.1 % (ref 36.0–46.0)
Hemoglobin: 11.9 g/dL — ABNORMAL LOW (ref 12.0–15.0)
Immature Granulocytes: 5 %
Lymphocytes Relative: 12 %
Lymphs Abs: 1.3 10*3/uL (ref 0.7–4.0)
MCH: 25.5 pg — ABNORMAL LOW (ref 26.0–34.0)
MCHC: 32.1 g/dL (ref 30.0–36.0)
MCV: 79.6 fL — ABNORMAL LOW (ref 80.0–100.0)
Monocytes Absolute: 0.7 10*3/uL (ref 0.1–1.0)
Monocytes Relative: 6 %
Neutro Abs: 8.6 10*3/uL — ABNORMAL HIGH (ref 1.7–7.7)
Neutrophils Relative %: 76 %
Platelets: 598 10*3/uL — ABNORMAL HIGH (ref 150–400)
RBC: 4.66 MIL/uL (ref 3.87–5.11)
WBC: 11.1 10*3/uL — ABNORMAL HIGH (ref 4.0–10.5)
nRBC: 0 % (ref 0.0–0.2)

## 2020-04-24 LAB — C-REACTIVE PROTEIN: CRP: 0.8 mg/dL (ref <1.0)

## 2020-04-24 LAB — D-DIMER, QUANTITATIVE: D-Dimer, Quant: 1.24 ug/mL-FEU — ABNORMAL HIGH (ref 0.00–0.50)

## 2020-04-24 MED ORDER — METHYLPREDNISOLONE SODIUM SUCC 125 MG IJ SOLR
40.0000 mg | Freq: Two times a day (BID) | INTRAMUSCULAR | Status: DC
  Administered 2020-04-24 – 2020-04-25 (×2): 40 mg via INTRAVENOUS
  Filled 2020-04-24 (×2): qty 2

## 2020-04-24 NOTE — Plan of Care (Signed)
  Problem: Education: Goal: Knowledge of risk factors and measures for prevention of condition will improve Outcome: Progressing   Problem: Coping: Goal: Psychosocial and spiritual needs will be supported Outcome: Progressing   Problem: Respiratory: Goal: Will maintain a patent airway Outcome: Progressing Goal: Complications related to the disease process, condition or treatment will be avoided or minimized Outcome: Progressing   

## 2020-04-24 NOTE — Progress Notes (Signed)
PROGRESS NOTE  [name]  YTK:[id number] DOB: December 02, 1985 DOA: 04/20/2020 PCP: [name], PA   Brief Narrative: [name] is a 34 y.o. female with a history of morbid obesity, HTN, GERD, and covid-19 infection diagnosed 11/5 who presented to the ED 11/12 with worsening dyspnea found to be severely hypoxic requiring 15L O2, febrile to 101.24F, tachycardic, tachypneic, and hypertensive with CXR confirming multifocal pneumonia. CRP 16. Remdesivir, steroids, and baricitinib were given on admission for acute hypoxic respiratory failure due to covid-19 pneumonia.   Assessment & Plan: Principal Problem:   Acute hypoxemic respiratory failure due to COVID-19 [facility]) Active Problems:   Hypertension   Acid reflux   Obesity, Class III, BMI 40-49.9 (morbid obesity) ([place])  Acute hypoxemic respiratory failure due to covid-19 pneumonia: SARS-CoV-2 positive on 11/5.  - Continue weaning efforts and OOB.  - Continue remdesivir x5 days (11/12 - 11/16) - Continue solumedrol. CRP normalized, will wean steroid dose. - Continue baricitinib. Will have to monitor Cr (wnl), LFTs (wnl), blood counts, and blood cultures.  - Continue airborne, contact precautions for 21 days from positive testing. - Lovenox  Microcytic anemia: Iron wnl, TIBC low and ferritin high. Suspect severe inflammation is cause of these findings.  - Recommend iron studies after discharge.   Hypokalemia: Resolved. - Supplemented.   HTN: At goal BP. - Continue norvasc 10mg , continue metoprolol prn  S. epidermidis in 1 of 4 blood culture bottles: Consistent with contaminant at this time.  - Continue monitoring off antibiotics.  GERD:  - Continue PPI  Menstrual cramps: hCG neg. - Ibuprofen prn since tylenol ineffective.   Morbid obesity: Estimated body mass index is 48.76 kg/m as calculated from the following:   Height as of this encounter: 5\' 5"  (1.651 m).   Weight as of this encounter: 132.9 kg.  DVT  prophylaxis: Lovenox Code Status: Full Family Communication: None at bedside  Disposition Plan:  Status is: Inpatient - Remain in PCU  Remains inpatient appropriate because:Inpatient level of care appropriate due to severity of illness  Dispo: The patient is from: Home              Anticipated d/c is to: Home              Anticipated d/c date is: 3 days pending ability to continue weaning oxygen              Patient currently is not medically stable to d/c.  Consultants:   None  Procedures:   None  Antimicrobials:  Remdesivir   Subjective: Feeling better day-by-day. Shortness of breath with ambulation a couple steps is moderate, but improved gradually from yesterday, improved when resting. No chest pain or leg swelling.  Objective: Vitals:   04/23/20 1325 04/23/20 2054 04/23/20 2243 04/24/20 0612  BP: (!) 154/105 (!) 150/101 (!) 150/95 124/81  Pulse: 95 (!) 108 (!) 104 78  Resp:  20 20 20   Temp: 98.2 F (36.8 C) 98.5 F (36.9 C) 98 F (36.7 C) (!) 97.5 F (36.4 C)  TempSrc: Oral     SpO2: 93% 96% 92% 95%  Weight:      Height:        Intake/Output Summary (Last 24 hours) at 04/24/2020 1221 Last data filed at 04/23/2020 2054 Gross per 24 hour  Intake 450 ml  Output 400 ml  Net 50 ml   Filed Weights   04/20/20 1209  Weight: 132.9 kg   Gen: 34 y.o. female in no distress Pulm:  Nonlabored breathing supplemental oxygen by HFNC, crackles at bases improving.  CV: Regular rate and rhythm. No murmur, rub, or gallop. No JVD, no dependent edema. GI: Abdomen soft, non-tender, non-distended, with normoactive bowel sounds.  Ext: Warm, no deformities Skin: No rashes, lesions or ulcers on visualized skin. Neuro: Alert and oriented. No focal neurological deficits. Psych: Judgement and insight appear fair. Mood euthymic & affect congruent. Behavior is appropriate.    Data Reviewed: I have personally reviewed following labs and imaging studies  CBC: Recent Labs  Lab  04/20/20 1210 04/21/20 0559 04/22/20 0512 04/23/20 0436 04/24/20 0450  WBC 9.0 7.7 9.0 11.0* 11.1*  NEUTROABS 7.9* 6.6 7.3 8.3* 8.6*  HGB 11.9* 11.5* 11.7* 11.6* 11.9*  HCT 36.9 36.7 37.0 36.6 37.1  MCV 78.8* 79.6* 79.9* 78.9* 79.6*  PLT 277 353 489* 569* 270*   Basic Metabolic Panel: Recent Labs  Lab 04/20/20 1210 04/20/20 1510 04/21/20 0559 04/22/20 0512 04/23/20 0436 04/24/20 0450  NA 132*  --  138 135 136 137  K 3.0*  --  3.8 3.7 3.6 4.1  CL 96*  --  103 101 102 102  CO2 25  --  25 24 25 26   GLUCOSE 120*  --  125* 132* 134* 136*  BUN <5*  --  11 15 18 17   CREATININE 0.69  --  0.63 0.65 0.66 0.71  CALCIUM 8.3*  --  8.6* 8.7* 8.7* 8.5*  MG  --  2.0  --   --   --   --    GFR: Estimated Creatinine Clearance: 134.1 mL/min (by C-G formula based on SCr of 0.71 mg/dL). Liver Function Tests: Recent Labs  Lab 04/20/20 1210 04/21/20 0559 04/22/20 0512 04/23/20 0436 04/24/20 0450  AST 27 23 22 23 24   ALT 19 19 19 20 26   ALKPHOS 41 40 40 41 38  BILITOT 0.6 0.6 0.4 0.3 0.4  PROT 8.3* 7.6 7.4 7.2 6.7  ALBUMIN 3.5 3.2* 3.1* 3.0* 2.9*   Coagulation Profile: Recent Labs  Lab 04/20/20 1210  INR 1.1   Lipid Profile: No results for input(s): CHOL, HDL, LDLCALC, TRIG, CHOLHDL, LDLDIRECT in the last 72 hours. Anemia Panel: Recent Labs    04/23/20 0436  VITAMINB12 488  FOLATE 8.0  FERRITIN 329*  TIBC 216*  IRON 62  RETICCTPCT 0.9   Urine analysis:    Component Value Date/Time   COLORURINE PINK (A) 04/20/2020 1245   APPEARANCEUR HAZY (A) 04/20/2020 1245   LABSPEC 1.002 (L) 04/20/2020 1245   PHURINE 6.0 04/20/2020 1245   GLUCOSEU NEGATIVE 04/20/2020 1245   HGBUR MODERATE (A) 04/20/2020 1245   BILIRUBINUR NEGATIVE 04/20/2020 1245   BILIRUBINUR negative 10/01/2019 1218   KETONESUR 5 (A) 04/20/2020 1245   PROTEINUR 100 (A) 04/20/2020 1245   UROBILINOGEN 0.2 10/01/2019 1218   UROBILINOGEN 1.0 03/26/2015 1053   NITRITE NEGATIVE 04/20/2020 1245   LEUKOCYTESUR  SMALL (A) 04/20/2020 1245   Recent Results (from the past 240 hour(s))  Culture, blood (Routine x 2)     Status: Abnormal   Collection Time: 04/20/20 12:00 PM   Specimen: BLOOD  Result Value Ref Range Status   Specimen Description   Final    BLOOD LEFT ANTECUBITAL Performed at [facility], [place] [address] [place], [place] [address]    Special Requests   Final    BOTTLES DRAWN AEROBIC AND ANAEROBIC Blood Culture adequate volume Performed at [facility] [address] [place], [place] [address]  Culture  Setup Time   Final    GRAM POSITIVE COCCI IN CLUSTERS ANAEROBIC BOTTLE ONLY CRITICAL RESULT CALLED TO, READ BACK BY AND VERIFIED WITH: [place] [id number] AT 0174 BY CM    Culture (A)  Final    STAPHYLOCOCCUS EPIDERMIDIS THE SIGNIFICANCE OF ISOLATING THIS ORGANISM FROM A SINGLE SET OF BLOOD CULTURES WHEN MULTIPLE SETS ARE DRAWN IS UNCERTAIN. PLEASE NOTIFY THE MICROBIOLOGY DEPARTMENT WITHIN ONE WEEK IF SPECIATION AND SENSITIVITIES ARE REQUIRED. Performed at [facility], [place] [address] [place], [place] [address]    Report Status 04/23/2020 FINAL  Final  Blood Culture ID Panel (Reflexed)     Status: Abnormal   Collection Time: 04/20/20 12:00 PM  Result Value Ref Range Status   Enterococcus faecalis NOT DETECTED NOT DETECTED Final   Enterococcus Faecium NOT DETECTED NOT DETECTED Final   Listeria monocytogenes NOT DETECTED NOT DETECTED Final   Staphylococcus species DETECTED (A) NOT DETECTED Final    Comment: CRITICAL RESULT CALLED TO, READ BACK BY AND VERIFIED WITH: pharmd m swayne [id number] at 1245 by cm    Staphylococcus aureus (BCID) NOT DETECTED NOT DETECTED Final   Staphylococcus epidermidis DETECTED (A) NOT DETECTED Final    Comment: CRITICAL RESULT CALLED TO, READ BACK BY AND VERIFIED WITH: PHARMD M SWAYNE [id number] AT 1245 BY CM    Staphylococcus lugdunensis NOT DETECTED NOT DETECTED Final   Streptococcus species NOT DETECTED NOT  DETECTED Final   Streptococcus agalactiae NOT DETECTED NOT DETECTED Final   Streptococcus pneumoniae NOT DETECTED NOT DETECTED Final   Streptococcus pyogenes NOT DETECTED NOT DETECTED Final   A.calcoaceticus-baumannii NOT DETECTED NOT DETECTED Final   Bacteroides fragilis NOT DETECTED NOT DETECTED Final   Enterobacterales NOT DETECTED NOT DETECTED Final   Enterobacter cloacae complex NOT DETECTED NOT DETECTED Final   Escherichia coli NOT DETECTED NOT DETECTED Final   Klebsiella aerogenes NOT DETECTED NOT DETECTED Final   Klebsiella oxytoca NOT DETECTED NOT DETECTED Final   Klebsiella pneumoniae NOT DETECTED NOT DETECTED Final   Proteus species NOT DETECTED NOT DETECTED Final   Salmonella species NOT DETECTED NOT DETECTED Final   Serratia marcescens NOT DETECTED NOT DETECTED Final   Haemophilus influenzae NOT DETECTED NOT DETECTED Final   Neisseria meningitidis NOT DETECTED NOT DETECTED Final   Pseudomonas aeruginosa NOT DETECTED NOT DETECTED Final   Stenotrophomonas maltophilia NOT DETECTED NOT DETECTED Final   Candida albicans NOT DETECTED NOT DETECTED Final   Candida auris NOT DETECTED NOT DETECTED Final   Candida glabrata NOT DETECTED NOT DETECTED Final   Candida krusei NOT DETECTED NOT DETECTED Final   Candida parapsilosis NOT DETECTED NOT DETECTED Final   Candida tropicalis NOT DETECTED NOT DETECTED Final   Cryptococcus neoformans/gattii NOT DETECTED NOT DETECTED Final   Methicillin resistance mecA/C NOT DETECTED NOT DETECTED Final    Comment: Performed at [facility], [address], [place], [place] [address]  Blood Culture (routine x 2)     Status: None (Preliminary result)   Collection Time: 04/20/20 12:30 PM   Specimen: BLOOD  Result Value Ref Range Status   Specimen Description   Final    BLOOD RIGHT ANTECUBITAL Performed at [facility] [address], [place], [place] [address]    Special Requests   Final    BOTTLES DRAWN AEROBIC AND  ANAEROBIC Blood Culture adequate volume Performed at [facility] [address] [place], [place] [address]    Culture   Final  NO GROWTH 3 DAYS Performed at [facility], [place] [address], [place], [place] [address]    Report Status PENDING  Incomplete      Radiology Studies: No results found.  Scheduled Meds: . amLODipine  10 mg Oral Daily  . baricitinib  4 mg Oral Daily  . enoxaparin (LOVENOX) injection  60 mg Subcutaneous Q24H  . Ipratropium-Albuterol  1 puff Inhalation Q6H  . methylPREDNISolone (SOLU-MEDROL) injection  80 mg Intravenous Q12H  . pantoprazole  40 mg Oral Daily   Continuous Infusions:    LOS: 4 days   Time spent: 35 minutes.  Patrecia Pour, MD [facility] [email or web address] 04/24/2020, 12:21 PM

## 2020-04-24 NOTE — TOC Progression Note (Signed)
Transition of Care [facility]) - Progression Note    Patient Details  Name: [name] MRN: [id number] Date of Birth: 07-Oct-1985  [facility] CM/SW Contact  Purcell Mouton, RN Phone Number: 04/24/2020, 1:16 PM  Clinical Narrative:     TOC will continue to follow for discharge.  Expected Discharge Plan: Home/Self Care Barriers to Discharge: No Barriers Identified  Expected Discharge Plan and Services Expected Discharge Plan: Home/Self Care       Living arrangements for the past 2 months: Single Family Home                                       Social Determinants of Health (SDOH) Interventions    Readmission Risk Interventions No flowsheet data found.

## 2020-04-25 LAB — CULTURE, BLOOD (ROUTINE X 2)
Culture: NO GROWTH
Special Requests: ADEQUATE

## 2020-04-25 MED ORDER — METHYLPREDNISOLONE SODIUM SUCC 125 MG IJ SOLR
40.0000 mg | Freq: Every day | INTRAMUSCULAR | Status: DC
  Administered 2020-04-26 – 2020-04-27 (×2): 40 mg via INTRAVENOUS
  Filled 2020-04-25 (×2): qty 2

## 2020-04-25 NOTE — Progress Notes (Signed)
[facility] PROGRESS NOTE    Progress Note  [name]  ACZ:[id number] DOB: 1985/07/12 DOA: 04/20/2020 PCP: [name], PA     Brief Narrative:   [name] is an 34 y.o. female past medical history of morbid obesity, essential hypertension COVID-19 infection diagnosed 04/13/2020 comes into the ED on 04/20/2020 with severe dyspnea fevers tachycardia and tachypnea with IV remdesivir steroids and baricitinib.  Assessment/Plan:   Acute hypoxemic respiratory failure due to COVID-19 [facility]): He has completed his course of IV remdesivir continue IV Solu-Medrol and baricitinib. CRP is normalized coming down on steroids. Try to wean to room air, try to keep the patient prone for Lee 16 hours a day if not prone out of bed to chair. Continue Lovenox prophylactic.  Microcytic anemia: Unable to determine due to severe inflammation will need iron studies as an outpatient. She is not menstruating for 2 female which is likely iron deficiency.  Hypokalemia: Replete orally now resolved.  Essential hypertension: Blood pressure goal continue Norvasc and metoprolol.  GERD: Continue PPI.     DVT prophylaxis: lovenox Family Communication:none Status is: Inpatient  Remains inpatient appropriate because:Hemodynamically unstable   Dispo: The patient is from: Home              Anticipated d/c is to: Home              Anticipated d/c date is: 2 days              Patient currently is not medically stable to d/c.        Code Status:     Code Status Orders  (From admission, onward)         Start     Ordered   04/20/20 1410  Full code  Continuous        04/20/20 1409        Code Status History    Date Active Date Inactive Code Status Order ID Comments User Context   12/02/2012 0324 12/04/2012 1739 Full Code [id number]  Edward Jolly, MD Inpatient   Advance Care Planning Activity        IV Access:    Peripheral IV   Procedures and  diagnostic studies:   No results found.   Medical Consultants:    None.  Anti-Infectives:  Baricitinib  Subjective:    [name] she relates her breathing is about the same Objective:    Vitals:   04/24/20 1318 04/24/20 2100 04/24/20 2129 04/25/20 0518  BP: (!) 144/97  (!) 151/108 113/80  Pulse: 96  98 80  Resp: 16  (!) 21 17  Temp: 98.6 F (37 C)  98.4 F (36.9 C) 98.7 F (37.1 C)  TempSrc:   Oral Oral  SpO2: 95% 97% 97% 96%  Weight:      Height:       SpO2: 96 % O2 Flow Rate (L/min): 7 L/min   Intake/Output Summary (Last 24 hours) at 04/25/2020 1216 Last data filed at 04/25/2020 1100 Gross per 24 hour  Intake 1440 ml  Output 700 ml  Net 740 ml   Filed Weights   04/20/20 1209  Weight: 132.9 kg    Exam: General exam: In no acute distress. Respiratory system: Good air movement and clear to auscultation. Cardiovascular system: S1 & S2 heard, RRR.  Gastrointestinal system: Abdomen is nondistended, soft and nontender.  Extremities: No pedal edema. Skin: No rashes, lesions or ulcers Psychiatry: Judgement and insight appear normal. Mood &  affect appropriate.    Data Reviewed:    Labs: Basic Metabolic Panel: Recent Labs  Lab 04/20/20 1210 04/20/20 1210 04/20/20 1510 04/21/20 0559 04/21/20 0559 04/22/20 0512 04/22/20 0512 04/23/20 0436 04/24/20 0450  NA 132*  --   --  138  --  135  --  136 137  K 3.0*   < >  --  3.8   < > 3.7   < > 3.6 4.1  CL 96*  --   --  103  --  101  --  102 102  CO2 25  --   --  25  --  24  --  25 26  GLUCOSE 120*  --   --  125*  --  132*  --  134* 136*  BUN <5*  --   --  11  --  15  --  18 17  CREATININE 0.69  --   --  0.63  --  0.65  --  0.66 0.71  CALCIUM 8.3*  --   --  8.6*  --  8.7*  --  8.7* 8.5*  MG  --   --  2.0  --   --   --   --   --   --    < > = values in this interval not displayed.   GFR Estimated Creatinine Clearance: 134.1 mL/min (by C-G formula based on SCr of 0.71 mg/dL). Liver Function  Tests: Recent Labs  Lab 04/20/20 1210 04/21/20 0559 04/22/20 0512 04/23/20 0436 04/24/20 0450  AST 27 23 22 23 24   ALT 19 19 19 20 26   ALKPHOS 41 40 40 41 38  BILITOT 0.6 0.6 0.4 0.3 0.4  PROT 8.3* 7.6 7.4 7.2 6.7  ALBUMIN 3.5 3.2* 3.1* 3.0* 2.9*   No results for input(s): LIPASE, AMYLASE in the last 168 hours. No results for input(s): AMMONIA in the last 168 hours. Coagulation profile Recent Labs  Lab 04/20/20 1210  INR 1.1   COVID-19 Labs  Recent Labs    04/23/20 0436 04/24/20 0450  DDIMER 1.27* 1.24*  FERRITIN 329*  --   CRP 1.6* 0.8    Lab Results  Component Value Date   SARSCOV2NAA Detected (A) 04/13/2020    CBC: Recent Labs  Lab 04/20/20 1210 04/21/20 0559 04/22/20 0512 04/23/20 0436 04/24/20 0450  WBC 9.0 7.7 9.0 11.0* 11.1*  NEUTROABS 7.9* 6.6 7.3 8.3* 8.6*  HGB 11.9* 11.5* 11.7* 11.6* 11.9*  HCT 36.9 36.7 37.0 36.6 37.1  MCV 78.8* 79.6* 79.9* 78.9* 79.6*  PLT 277 353 489* 569* 598*   Cardiac Enzymes: No results for input(s): CKTOTAL, CKMB, CKMBINDEX, TROPONINI in the last 168 hours. BNP (last 3 results) No results for input(s): PROBNP in the last 8760 hours. CBG: No results for input(s): GLUCAP in the last 168 hours. D-Dimer: Recent Labs    04/23/20 0436 04/24/20 0450  DDIMER 1.27* 1.24*   Hgb A1c: No results for input(s): HGBA1C in the last 72 hours. Lipid Profile: No results for input(s): CHOL, HDL, LDLCALC, TRIG, CHOLHDL, LDLDIRECT in the last 72 hours. Thyroid function studies: No results for input(s): TSH, T4TOTAL, T3FREE, THYROIDAB in the last 72 hours.  Invalid input(s): FREET3 Anemia work up: Recent Labs    04/23/20 0436  VITAMINB12 488  FOLATE 8.0  FERRITIN 329*  TIBC 216*  IRON 62  RETICCTPCT 0.9   Sepsis Labs: Recent Labs  Lab 04/20/20 1210 04/20/20 1210 04/20/20 1232 04/20/20 1510 04/21/20 0559 04/22/20 0512 04/23/20 0436  04/24/20 0450  PROCALCITON  --   --  <0.10  --   --   --   --   --   WBC 9.0    < >  --   --  7.7 9.0 11.0* 11.1*  LATICACIDVEN 0.9  --   --  0.9  --   --   --   --    < > = values in this interval not displayed.   Microbiology Recent Results (from the past 240 hour(s))  Culture, blood (Routine x 2)     Status: Abnormal   Collection Time: 04/20/20 12:00 PM   Specimen: BLOOD  Result Value Ref Range Status   Specimen Description   Final    BLOOD LEFT ANTECUBITAL Performed at [facility], [address], [place], [place] [address]    Special Requests   Final    BOTTLES DRAWN AEROBIC AND ANAEROBIC Blood Culture adequate volume Performed at [facility] [address], [place], [place] [address]    Culture  Setup Time   Final    GRAM POSITIVE COCCI IN CLUSTERS ANAEROBIC BOTTLE ONLY CRITICAL RESULT CALLED TO, READ BACK BY AND VERIFIED WITH: PHARMD M SWAYNE [id number] AT 1245 BY CM    Culture (A)  Final    STAPHYLOCOCCUS EPIDERMIDIS THE SIGNIFICANCE OF ISOLATING THIS ORGANISM FROM A SINGLE SET OF BLOOD CULTURES WHEN MULTIPLE SETS ARE DRAWN IS UNCERTAIN. PLEASE NOTIFY THE MICROBIOLOGY DEPARTMENT WITHIN ONE WEEK IF SPECIATION AND SENSITIVITIES ARE REQUIRED. Performed at [facility], [place] [address] [place], [place] [address]    Report Status 04/23/2020 FINAL  Final  Blood Culture ID Panel (Reflexed)     Status: Abnormal   Collection Time: 04/20/20 12:00 PM  Result Value Ref Range Status   Enterococcus faecalis NOT DETECTED NOT DETECTED Final   Enterococcus Faecium NOT DETECTED NOT DETECTED Final   Listeria monocytogenes NOT DETECTED NOT DETECTED Final   Staphylococcus species DETECTED (A) NOT DETECTED Final    Comment: CRITICAL RESULT CALLED TO, READ BACK BY AND VERIFIED WITH: pharmd m swayne [id number] at 1245 by cm    Staphylococcus aureus (BCID) NOT DETECTED NOT DETECTED Final   Staphylococcus epidermidis DETECTED (A) NOT DETECTED Final    Comment: CRITICAL RESULT CALLED TO, READ BACK BY AND VERIFIED WITH: PHARMD M SWAYNE [id number] AT  1245 BY CM    Staphylococcus lugdunensis NOT DETECTED NOT DETECTED Final   Streptococcus species NOT DETECTED NOT DETECTED Final   Streptococcus agalactiae NOT DETECTED NOT DETECTED Final   Streptococcus pneumoniae NOT DETECTED NOT DETECTED Final   Streptococcus pyogenes NOT DETECTED NOT DETECTED Final   A.calcoaceticus-baumannii NOT DETECTED NOT DETECTED Final   Bacteroides fragilis NOT DETECTED NOT DETECTED Final   Enterobacterales NOT DETECTED NOT DETECTED Final   Enterobacter cloacae complex NOT DETECTED NOT DETECTED Final   Escherichia coli NOT DETECTED NOT DETECTED Final   Klebsiella aerogenes NOT DETECTED NOT DETECTED Final   Klebsiella oxytoca NOT DETECTED NOT DETECTED Final   Klebsiella pneumoniae NOT DETECTED NOT DETECTED Final   Proteus species NOT DETECTED NOT DETECTED Final   Salmonella species NOT DETECTED NOT DETECTED Final   Serratia marcescens NOT DETECTED NOT DETECTED Final   Haemophilus influenzae NOT DETECTED NOT DETECTED Final   Neisseria meningitidis NOT DETECTED NOT DETECTED Final   Pseudomonas aeruginosa NOT DETECTED NOT DETECTED Final   Stenotrophomonas maltophilia NOT DETECTED NOT DETECTED Final   Candida albicans NOT DETECTED NOT DETECTED Final   Candida  auris NOT DETECTED NOT DETECTED Final   Candida glabrata NOT DETECTED NOT DETECTED Final   Candida krusei NOT DETECTED NOT DETECTED Final   Candida parapsilosis NOT DETECTED NOT DETECTED Final   Candida tropicalis NOT DETECTED NOT DETECTED Final   Cryptococcus neoformans/gattii NOT DETECTED NOT DETECTED Final   Methicillin resistance mecA/C NOT DETECTED NOT DETECTED Final    Comment: Performed at [facility], [place] [address] [place], [place] [address]  Blood Culture (routine x 2)     Status: None (Preliminary result)   Collection Time: 04/20/20 12:30 PM   Specimen: BLOOD  Result Value Ref Range Status   Specimen Description   Final    BLOOD RIGHT ANTECUBITAL Performed at [facility] [address] [place], [place] [address]    Special Requests   Final    BOTTLES DRAWN AEROBIC AND ANAEROBIC Blood Culture adequate volume Performed at [facility] [address] [place], [place] [address]    Culture   Final    NO GROWTH 4 DAYS Performed at [facility], [place] [address] [place], [place] [address]    Report Status PENDING  Incomplete     Medications:    amLODipine  10 mg Oral Daily   baricitinib  4 mg Oral Daily   enoxaparin (LOVENOX) injection  60 mg Subcutaneous Q24H   Ipratropium-Albuterol  1 puff Inhalation Q6H   methylPREDNISolone (SOLU-MEDROL) injection  40 mg Intravenous Q12H   pantoprazole  40 mg Oral Daily   Continuous Infusions:    LOS: 5 days   Charlynne Cousins  [facility]  04/25/2020, 12:16 PM

## 2020-04-25 NOTE — Plan of Care (Signed)
  Problem: Education: Goal: Knowledge of risk factors and measures for prevention of condition will improve Outcome: Progressing   Problem: Coping: Goal: Psychosocial and spiritual needs will be supported Outcome: Progressing   Problem: Respiratory: Goal: Will maintain a patent airway Outcome: Progressing Goal: Complications related to the disease process, condition or treatment will be avoided or minimized Outcome: Progressing   

## 2020-04-26 LAB — D-DIMER, QUANTITATIVE: D-Dimer, Quant: 1.05 ug/mL-FEU — ABNORMAL HIGH (ref 0.00–0.50)

## 2020-04-26 MED ORDER — ALPRAZOLAM 0.25 MG PO TABS
0.2500 mg | ORAL_TABLET | Freq: Two times a day (BID) | ORAL | Status: DC | PRN
  Administered 2020-04-26 (×2): 0.25 mg via ORAL
  Filled 2020-04-26 (×2): qty 1

## 2020-04-26 MED ORDER — PANTOPRAZOLE SODIUM 40 MG PO TBEC
80.0000 mg | DELAYED_RELEASE_TABLET | Freq: Every day | ORAL | Status: DC
  Administered 2020-04-27 – 2020-04-28 (×2): 80 mg via ORAL
  Filled 2020-04-26 (×2): qty 2

## 2020-04-26 NOTE — Plan of Care (Signed)
  Problem: Education: Goal: Knowledge of risk factors and measures for prevention of condition will improve Outcome: Progressing   Problem: Coping: Goal: Psychosocial and spiritual needs will be supported Outcome: Progressing   Problem: Respiratory: Goal: Will maintain a patent airway Outcome: Progressing Goal: Complications related to the disease process, condition or treatment will be avoided or minimized Outcome: Progressing   

## 2020-04-26 NOTE — Progress Notes (Signed)
[facility] PROGRESS NOTE    Progress Note  [name]  WUX:[id number] DOB: 08/25/1985 DOA: 04/20/2020 PCP: [name], PA     Brief Narrative:   [name] is an 34 y.o. female past medical history of morbid obesity, essential hypertension COVID-19 infection diagnosed 04/13/2020 comes into the ED on 04/20/2020 with severe dyspnea fevers tachycardia and tachypnea with IV remdesivir steroids and baricitinib.  Assessment/Plan:   Acute hypoxemic respiratory failure due to COVID-19 [facility]): He has completed his course of IV remdesivir continue IV Solu-Medrol and baricitinib. Her D-dimer this morning, she has been weaned down to 3 L very nicely. Continue to prone patient for at least 16 hours a day, out of bed to chair continue prophylactic Lovenox.  Microcytic anemia: Unable to determine due to severe inflammation will need iron studies as an outpatient. She is not menstruating for 2 female which is likely iron deficiency.  Hypokalemia: Replete orally now resolved.  Essential hypertension: Blood pressure goal continue Norvasc and metoprolol.  GERD: Continue PPI.     DVT prophylaxis: lovenox Family Communication:none Status is: Inpatient  Remains inpatient appropriate because:Hemodynamically unstable   Dispo: The patient is from: Home              Anticipated d/c is to: Home              Anticipated d/c date is: 2 days              Patient currently is not medically stable to d/c.        Code Status:     Code Status Orders  (From admission, onward)         Start     Ordered   04/20/20 1410  Full code  Continuous        04/20/20 1409        Code Status History    Date Active Date Inactive Code Status Order ID Comments User Context   12/02/2012 0324 12/04/2012 1739 Full Code [id number]  Edward Jolly, MD Inpatient   Advance Care Planning Activity        IV Access:    Peripheral IV   Procedures and diagnostic  studies:   No results found.   Medical Consultants:    None.  Anti-Infectives:  Baricitinib  Subjective:    [name] relates her breathing is better than yesterday. Objective:    Vitals:   04/25/20 0518 04/25/20 1600 04/25/20 2109 04/26/20 0440  BP: 113/80  (!) 148/83 (!) 142/92  Pulse: 80  98 89  Resp: 17  17 19   Temp: 98.7 F (37.1 C)  98.3 F (36.8 C) 98.2 F (36.8 C)  TempSrc: Oral  Oral Oral  SpO2: 96% 92% 97% 96%  Weight:      Height:       SpO2: 96 % O2 Flow Rate (L/min): 3 L/min   Intake/Output Summary (Last 24 hours) at 04/26/2020 0830 Last data filed at 04/26/2020 0300 Gross per 24 hour  Intake 1360 ml  Output --  Net 1360 ml   Filed Weights   04/20/20 1209  Weight: 132.9 kg    Exam: General exam: In no acute distress. Respiratory system: Good air movement and clear to auscultation. Cardiovascular system: S1 & S2 heard, RRR. No JVD. Gastrointestinal system: Abdomen is nondistended, soft and nontender.  Extremities: No pedal edema. Skin: No rashes, lesions or ulcers Psychiatry: Judgement and insight appear normal. Mood & affect appropriate. Data Reviewed:  Labs: Basic Metabolic Panel: Recent Labs  Lab 04/20/20 1210 04/20/20 1210 04/20/20 1510 04/21/20 0559 04/21/20 0559 04/22/20 0512 04/22/20 0512 04/23/20 0436 04/24/20 0450  NA 132*  --   --  138  --  135  --  136 137  K 3.0*   < >  --  3.8   < > 3.7   < > 3.6 4.1  CL 96*  --   --  103  --  101  --  102 102  CO2 25  --   --  25  --  24  --  25 26  GLUCOSE 120*  --   --  125*  --  132*  --  134* 136*  BUN <5*  --   --  11  --  15  --  18 17  CREATININE 0.69  --   --  0.63  --  0.65  --  0.66 0.71  CALCIUM 8.3*  --   --  8.6*  --  8.7*  --  8.7* 8.5*  MG  --   --  2.0  --   --   --   --   --   --    < > = values in this interval not displayed.   GFR Estimated Creatinine Clearance: 134.1 mL/min (by C-G formula based on SCr of 0.71 mg/dL). Liver Function  Tests: Recent Labs  Lab 04/20/20 1210 04/21/20 0559 04/22/20 0512 04/23/20 0436 04/24/20 0450  AST 27 23 22 23 24   ALT 19 19 19 20 26   ALKPHOS 41 40 40 41 38  BILITOT 0.6 0.6 0.4 0.3 0.4  PROT 8.3* 7.6 7.4 7.2 6.7  ALBUMIN 3.5 3.2* 3.1* 3.0* 2.9*   No results for input(s): LIPASE, AMYLASE in the last 168 hours. No results for input(s): AMMONIA in the last 168 hours. Coagulation profile Recent Labs  Lab 04/20/20 1210  INR 1.1   COVID-19 Labs  Recent Labs    04/24/20 0450  DDIMER 1.24*  CRP 0.8    Lab Results  Component Value Date   SARSCOV2NAA Detected (A) 04/13/2020    CBC: Recent Labs  Lab 04/20/20 1210 04/21/20 0559 04/22/20 0512 04/23/20 0436 04/24/20 0450  WBC 9.0 7.7 9.0 11.0* 11.1*  NEUTROABS 7.9* 6.6 7.3 8.3* 8.6*  HGB 11.9* 11.5* 11.7* 11.6* 11.9*  HCT 36.9 36.7 37.0 36.6 37.1  MCV 78.8* 79.6* 79.9* 78.9* 79.6*  PLT 277 353 489* 569* 598*   Cardiac Enzymes: No results for input(s): CKTOTAL, CKMB, CKMBINDEX, TROPONINI in the last 168 hours. BNP (last 3 results) No results for input(s): PROBNP in the last 8760 hours. CBG: No results for input(s): GLUCAP in the last 168 hours. D-Dimer: Recent Labs    04/24/20 0450  DDIMER 1.24*   Hgb A1c: No results for input(s): HGBA1C in the last 72 hours. Lipid Profile: No results for input(s): CHOL, HDL, LDLCALC, TRIG, CHOLHDL, LDLDIRECT in the last 72 hours. Thyroid function studies: No results for input(s): TSH, T4TOTAL, T3FREE, THYROIDAB in the last 72 hours.  Invalid input(s): FREET3 Anemia work up: No results for input(s): VITAMINB12, FOLATE, FERRITIN, TIBC, IRON, RETICCTPCT in the last 72 hours. Sepsis Labs: Recent Labs  Lab 04/20/20 1210 04/20/20 1210 04/20/20 1232 04/20/20 1510 04/21/20 0559 04/22/20 0512 04/23/20 0436 04/24/20 0450  PROCALCITON  --   --  <0.10  --   --   --   --   --   WBC 9.0   < >  --   --  7.7 9.0 11.0* 11.1*  LATICACIDVEN 0.9  --   --  0.9  --   --   --   --     < > = values in this interval not displayed.   Microbiology Recent Results (from the past 240 hour(s))  Culture, blood (Routine x 2)     Status: Abnormal   Collection Time: 04/20/20 12:00 PM   Specimen: BLOOD  Result Value Ref Range Status   Specimen Description   Final    BLOOD LEFT ANTECUBITAL Performed at [facility], [address], [place], [place] [address]    Special Requests   Final    BOTTLES DRAWN AEROBIC AND ANAEROBIC Blood Culture adequate volume Performed at [facility] [address], [place], [place] [address]    Culture  Setup Time   Final    GRAM POSITIVE COCCI IN CLUSTERS ANAEROBIC BOTTLE ONLY CRITICAL RESULT CALLED TO, READ BACK BY AND VERIFIED WITH: PHARMD M SWAYNE [id number] AT 1245 BY CM    Culture (A)  Final    STAPHYLOCOCCUS EPIDERMIDIS THE SIGNIFICANCE OF ISOLATING THIS ORGANISM FROM A SINGLE SET OF BLOOD CULTURES WHEN MULTIPLE SETS ARE DRAWN IS UNCERTAIN. PLEASE NOTIFY THE MICROBIOLOGY DEPARTMENT WITHIN ONE WEEK IF SPECIATION AND SENSITIVITIES ARE REQUIRED. Performed at [facility], [place] [address], [place], [place] [address]    Report Status 04/23/2020 FINAL  Final  Blood Culture ID Panel (Reflexed)     Status: Abnormal   Collection Time: 04/20/20 12:00 PM  Result Value Ref Range Status   Enterococcus faecalis NOT DETECTED NOT DETECTED Final   Enterococcus Faecium NOT DETECTED NOT DETECTED Final   Listeria monocytogenes NOT DETECTED NOT DETECTED Final   Staphylococcus species DETECTED (A) NOT DETECTED Final    Comment: CRITICAL RESULT CALLED TO, READ BACK BY AND VERIFIED WITH: pharmd m swayne [id number] at 1245 by cm    Staphylococcus aureus (BCID) NOT DETECTED NOT DETECTED Final   Staphylococcus epidermidis DETECTED (A) NOT DETECTED Final    Comment: CRITICAL RESULT CALLED TO, READ BACK BY AND VERIFIED WITH: PHARMD M SWAYNE [id number] AT 1245 BY CM    Staphylococcus lugdunensis NOT DETECTED NOT DETECTED Final    Streptococcus species NOT DETECTED NOT DETECTED Final   Streptococcus agalactiae NOT DETECTED NOT DETECTED Final   Streptococcus pneumoniae NOT DETECTED NOT DETECTED Final   Streptococcus pyogenes NOT DETECTED NOT DETECTED Final   A.calcoaceticus-baumannii NOT DETECTED NOT DETECTED Final   Bacteroides fragilis NOT DETECTED NOT DETECTED Final   Enterobacterales NOT DETECTED NOT DETECTED Final   Enterobacter cloacae complex NOT DETECTED NOT DETECTED Final   Escherichia coli NOT DETECTED NOT DETECTED Final   Klebsiella aerogenes NOT DETECTED NOT DETECTED Final   Klebsiella oxytoca NOT DETECTED NOT DETECTED Final   Klebsiella pneumoniae NOT DETECTED NOT DETECTED Final   Proteus species NOT DETECTED NOT DETECTED Final   Salmonella species NOT DETECTED NOT DETECTED Final   Serratia marcescens NOT DETECTED NOT DETECTED Final   Haemophilus influenzae NOT DETECTED NOT DETECTED Final   Neisseria meningitidis NOT DETECTED NOT DETECTED Final   Pseudomonas aeruginosa NOT DETECTED NOT DETECTED Final   Stenotrophomonas maltophilia NOT DETECTED NOT DETECTED Final   Candida albicans NOT DETECTED NOT DETECTED Final   Candida auris NOT DETECTED NOT DETECTED Final   Candida glabrata NOT DETECTED NOT DETECTED Final   Candida krusei NOT DETECTED NOT DETECTED Final   Candida parapsilosis NOT DETECTED NOT DETECTED Final   Candida tropicalis NOT DETECTED  NOT DETECTED Final   Cryptococcus neoformans/gattii NOT DETECTED NOT DETECTED Final   Methicillin resistance mecA/C NOT DETECTED NOT DETECTED Final    Comment: Performed at [facility], [place] [address], [place], [place] [address]  Blood Culture (routine x 2)     Status: None   Collection Time: 04/20/20 12:30 PM   Specimen: BLOOD  Result Value Ref Range Status   Specimen Description   Final    BLOOD RIGHT ANTECUBITAL Performed at [facility] [address], [place], [place] [address]    Special Requests   Final    BOTTLES DRAWN  AEROBIC AND ANAEROBIC Blood Culture adequate volume Performed at [facility] [address] [place], [place] [address]    Culture   Final    NO GROWTH 5 DAYS Performed at [facility], [place] [address] [place], [place] [address]    Report Status 04/25/2020 FINAL  Final     Medications:    amLODipine  10 mg Oral Daily   baricitinib  4 mg Oral Daily   enoxaparin (LOVENOX) injection  60 mg Subcutaneous Q24H   Ipratropium-Albuterol  1 puff Inhalation Q6H   methylPREDNISolone (SOLU-MEDROL) injection  40 mg Intravenous Daily   pantoprazole  40 mg Oral Daily   Continuous Infusions:    LOS: 6 days   Charlynne Cousins  [facility]  04/26/2020, 8:30 AM

## 2020-04-26 NOTE — Progress Notes (Signed)
Pt has been weaned to 2L [place] and tolerated well this shift. O2 sat has maintained in mid to high 90's. Pt did have increased anxiety around 1500 about being in the hospital and wanting to go home which caused an increase heart rate in the 120's. This RN notified Olevia Bowens MD and Xanax was prescribed PRN. Xanax administered and pt said "that helped a lot." No further needs at this time.

## 2020-04-27 LAB — D-DIMER, QUANTITATIVE: D-Dimer, Quant: 0.73 ug/mL-FEU — ABNORMAL HIGH (ref 0.00–0.50)

## 2020-04-27 LAB — CREATININE, SERUM
Creatinine, Ser: 0.63 mg/dL (ref 0.44–1.00)
GFR, Estimated: >60 mL/min (ref >60)

## 2020-04-27 NOTE — Progress Notes (Signed)
[facility] PROGRESS NOTE    Progress Note  [name]  BZJ:[id number] DOB: 1985/08/02 DOA: 04/20/2020 PCP: [name], PA     Brief Narrative:   [name] is an 34 y.o. female past medical history of morbid obesity, essential hypertension COVID-19 infection diagnosed 04/13/2020 comes into the ED on 04/20/2020 with severe dyspnea fevers tachycardia and tachypnea with IV remdesivir steroids and baricitinib.  Assessment/Plan:   Acute hypoxemic respiratory failure due to COVID-19 [facility]): He has completed his course of IV remdesivir she will be continued on IV Solu-Medrol and baricitinib. She is now requiring 2 L of oxygen to keep saturations greater than 90%. Try to keep the patient prone for at least 16 hours a day, out of bed to chair. Encourage incentive spirometry and flutter valve. Continue DVT prophylaxis.  Microcytic anemia: Unable to determine due to severe inflammation will need iron studies as an outpatient. She is not menstruating for 2 female which is likely iron deficiency.  Hypokalemia: Replete orally now resolved.  Essential hypertension: Blood pressure goal continue Norvasc and metoprolol.  GERD: Continue PPI.     DVT prophylaxis: lovenox Family Communication:none Status is: Inpatient  Remains inpatient appropriate because:Hemodynamically unstable   Dispo: The patient is from: Home              Anticipated d/c is to: Home              Anticipated d/c date is: 2 days              Patient currently is not medically stable to d/c.        Code Status:     Code Status Orders  (From admission, onward)         Start     Ordered   04/20/20 1410  Full code  Continuous        04/20/20 1409        Code Status History    Date Active Date Inactive Code Status Order ID Comments User Context   12/02/2012 0324 12/04/2012 1739 Full Code [id number]  Edward Jolly, MD Inpatient   Advance Care Planning Activity         IV Access:    Peripheral IV   Procedures and diagnostic studies:   No results found.   Medical Consultants:    None.  Anti-Infectives:  Baricitinib  Subjective:    [name] has no new complaints this morning. Objective:    Vitals:   04/26/20 1415 04/26/20 1447 04/26/20 1928 04/27/20 0607  BP: (!) 148/94 139/90 129/74 129/81  Pulse: (!) 121 (!) 120 (!) 103 81  Resp: (!) 25 20 20 17   Temp: 99.5 F (37.5 C) 98.5 F (36.9 C) 98.7 F (37.1 C) 97.8 F (36.6 C)  TempSrc:  Oral Oral Oral  SpO2: 95% 91% 96% 95%  Weight:      Height:       SpO2: 95 % O2 Flow Rate (L/min): 2 L/min   Intake/Output Summary (Last 24 hours) at 04/27/2020 0810 Last data filed at 04/27/2020 0100 Gross per 24 hour  Intake 960 ml  Output --  Net 960 ml   Filed Weights   04/20/20 1209  Weight: 132.9 kg    Exam: General exam: In no acute distress. Respiratory system: Good air movement and clear to auscultation. Cardiovascular system: S1 & S2 heard, RRR. No JVD. Gastrointestinal system: Abdomen is nondistended, soft and nontender.  Skin: No rashes, lesions or ulcers  Data Reviewed:    Labs: Basic Metabolic Panel: Recent Labs  Lab 04/20/20 1210 04/20/20 1210 04/20/20 1510 04/21/20 0559 04/21/20 0559 04/22/20 0512 04/22/20 0512 04/23/20 0436 04/24/20 0450 04/27/20 0532  NA 132*  --   --  138  --  135  --  136 137  --   K 3.0*   < >  --  3.8   < > 3.7   < > 3.6 4.1  --   CL 96*  --   --  103  --  101  --  102 102  --   CO2 25  --   --  25  --  24  --  25 26  --   GLUCOSE 120*  --   --  125*  --  132*  --  134* 136*  --   BUN <5*  --   --  11  --  15  --  18 17  --   CREATININE 0.69   < >  --  0.63  --  0.65  --  0.66 0.71 0.63  CALCIUM 8.3*  --   --  8.6*  --  8.7*  --  8.7* 8.5*  --   MG  --   --  2.0  --   --   --   --   --   --   --    < > = values in this interval not displayed.   GFR Estimated Creatinine Clearance: 134.1 mL/min (by C-G formula  based on SCr of 0.63 mg/dL). Liver Function Tests: Recent Labs  Lab 04/20/20 1210 04/21/20 0559 04/22/20 0512 04/23/20 0436 04/24/20 0450  AST 27 23 22 23 24   ALT 19 19 19 20 26   ALKPHOS 41 40 40 41 38  BILITOT 0.6 0.6 0.4 0.3 0.4  PROT 8.3* 7.6 7.4 7.2 6.7  ALBUMIN 3.5 3.2* 3.1* 3.0* 2.9*   No results for input(s): LIPASE, AMYLASE in the last 168 hours. No results for input(s): AMMONIA in the last 168 hours. Coagulation profile Recent Labs  Lab 04/20/20 1210  INR 1.1   COVID-19 Labs  Recent Labs    04/26/20 0920 04/27/20 0532  DDIMER 1.05* 0.73*    Lab Results  Component Value Date   SARSCOV2NAA Detected (A) 04/13/2020    CBC: Recent Labs  Lab 04/20/20 1210 04/21/20 0559 04/22/20 0512 04/23/20 0436 04/24/20 0450  WBC 9.0 7.7 9.0 11.0* 11.1*  NEUTROABS 7.9* 6.6 7.3 8.3* 8.6*  HGB 11.9* 11.5* 11.7* 11.6* 11.9*  HCT 36.9 36.7 37.0 36.6 37.1  MCV 78.8* 79.6* 79.9* 78.9* 79.6*  PLT 277 353 489* 569* 598*   Cardiac Enzymes: No results for input(s): CKTOTAL, CKMB, CKMBINDEX, TROPONINI in the last 168 hours. BNP (last 3 results) No results for input(s): PROBNP in the last 8760 hours. CBG: No results for input(s): GLUCAP in the last 168 hours. D-Dimer: Recent Labs    04/26/20 0920 04/27/20 0532  DDIMER 1.05* 0.73*   Hgb A1c: No results for input(s): HGBA1C in the last 72 hours. Lipid Profile: No results for input(s): CHOL, HDL, LDLCALC, TRIG, CHOLHDL, LDLDIRECT in the last 72 hours. Thyroid function studies: No results for input(s): TSH, T4TOTAL, T3FREE, THYROIDAB in the last 72 hours.  Invalid input(s): FREET3 Anemia work up: No results for input(s): VITAMINB12, FOLATE, FERRITIN, TIBC, IRON, RETICCTPCT in the last 72 hours. Sepsis Labs: Recent Labs  Lab 04/20/20 1210 04/20/20 1210 04/20/20 1232 04/20/20 1510 04/21/20 0559 04/22/20 0512 04/23/20  0865 04/24/20 0450  PROCALCITON  --   --  <0.10  --   --   --   --   --   WBC 9.0   < >  --    --  7.7 9.0 11.0* 11.1*  LATICACIDVEN 0.9  --   --  0.9  --   --   --   --    < > = values in this interval not displayed.   Microbiology Recent Results (from the past 240 hour(s))  Culture, blood (Routine x 2)     Status: Abnormal   Collection Time: 04/20/20 12:00 PM   Specimen: BLOOD  Result Value Ref Range Status   Specimen Description   Final    BLOOD LEFT ANTECUBITAL Performed at [facility], [address] [place], [place] [address]    Special Requests   Final    BOTTLES DRAWN AEROBIC AND ANAEROBIC Blood Culture adequate volume Performed at [facility] [address] [place], [place] [address]    Culture  Setup Time   Final    GRAM POSITIVE COCCI IN CLUSTERS ANAEROBIC BOTTLE ONLY CRITICAL RESULT CALLED TO, READ BACK BY AND VERIFIED WITH: PHARMD M SWAYNE [id number] AT 1245 BY CM    Culture (A)  Final    STAPHYLOCOCCUS EPIDERMIDIS THE SIGNIFICANCE OF ISOLATING THIS ORGANISM FROM A SINGLE SET OF BLOOD CULTURES WHEN MULTIPLE SETS ARE DRAWN IS UNCERTAIN. PLEASE NOTIFY THE MICROBIOLOGY DEPARTMENT WITHIN ONE WEEK IF SPECIATION AND SENSITIVITIES ARE REQUIRED. Performed at [facility], [place] [address], [place], [place] [address]    Report Status 04/23/2020 FINAL  Final  Blood Culture ID Panel (Reflexed)     Status: Abnormal   Collection Time: 04/20/20 12:00 PM  Result Value Ref Range Status   Enterococcus faecalis NOT DETECTED NOT DETECTED Final   Enterococcus Faecium NOT DETECTED NOT DETECTED Final   Listeria monocytogenes NOT DETECTED NOT DETECTED Final   Staphylococcus species DETECTED (A) NOT DETECTED Final    Comment: CRITICAL RESULT CALLED TO, READ BACK BY AND VERIFIED WITH: pharmd m swayne [id number] at 1245 by cm    Staphylococcus aureus (BCID) NOT DETECTED NOT DETECTED Final   Staphylococcus epidermidis DETECTED (A) NOT DETECTED Final    Comment: CRITICAL RESULT CALLED TO, READ BACK BY AND VERIFIED WITH: PHARMD M SWAYNE [id number] AT 1245 BY  CM    Staphylococcus lugdunensis NOT DETECTED NOT DETECTED Final   Streptococcus species NOT DETECTED NOT DETECTED Final   Streptococcus agalactiae NOT DETECTED NOT DETECTED Final   Streptococcus pneumoniae NOT DETECTED NOT DETECTED Final   Streptococcus pyogenes NOT DETECTED NOT DETECTED Final   A.calcoaceticus-baumannii NOT DETECTED NOT DETECTED Final   Bacteroides fragilis NOT DETECTED NOT DETECTED Final   Enterobacterales NOT DETECTED NOT DETECTED Final   Enterobacter cloacae complex NOT DETECTED NOT DETECTED Final   Escherichia coli NOT DETECTED NOT DETECTED Final   Klebsiella aerogenes NOT DETECTED NOT DETECTED Final   Klebsiella oxytoca NOT DETECTED NOT DETECTED Final   Klebsiella pneumoniae NOT DETECTED NOT DETECTED Final   Proteus species NOT DETECTED NOT DETECTED Final   Salmonella species NOT DETECTED NOT DETECTED Final   Serratia marcescens NOT DETECTED NOT DETECTED Final   Haemophilus influenzae NOT DETECTED NOT DETECTED Final   Neisseria meningitidis NOT DETECTED NOT DETECTED Final   Pseudomonas aeruginosa NOT DETECTED NOT DETECTED Final   Stenotrophomonas maltophilia NOT DETECTED NOT DETECTED Final   Candida albicans NOT DETECTED NOT DETECTED Final  Candida auris NOT DETECTED NOT DETECTED Final   Candida glabrata NOT DETECTED NOT DETECTED Final   Candida krusei NOT DETECTED NOT DETECTED Final   Candida parapsilosis NOT DETECTED NOT DETECTED Final   Candida tropicalis NOT DETECTED NOT DETECTED Final   Cryptococcus neoformans/gattii NOT DETECTED NOT DETECTED Final   Methicillin resistance mecA/C NOT DETECTED NOT DETECTED Final    Comment: Performed at [facility], [place] [address] [place], [place] [address]  Blood Culture (routine x 2)     Status: None   Collection Time: 04/20/20 12:30 PM   Specimen: BLOOD  Result Value Ref Range Status   Specimen Description   Final    BLOOD RIGHT ANTECUBITAL Performed at [facility] [address],  [place], [place] [address]    Special Requests   Final    BOTTLES DRAWN AEROBIC AND ANAEROBIC Blood Culture adequate volume Performed at [place] [place] [address], [place], [place] [address]    Culture   Final    NO GROWTH 5 DAYS Performed at [facility], [address], [place], [place] [address]    Report Status 04/25/2020 FINAL  Final     Medications:   . amLODipine  10 mg Oral Daily  . baricitinib  4 mg Oral Daily  . enoxaparin (LOVENOX) injection  60 mg Subcutaneous Q24H  . Ipratropium-Albuterol  1 puff Inhalation Q6H  . methylPREDNISolone (SOLU-MEDROL) injection  40 mg Intravenous Daily  . pantoprazole  80 mg Oral Daily   Continuous Infusions:    LOS: 7 days   Charlynne Cousins  [facility]  04/27/2020, 8:10 AM

## 2020-04-28 DIAGNOSIS — R0902 Hypoxemia: Secondary | ICD-10-CM

## 2020-04-28 MED ORDER — METHYLPREDNISOLONE SODIUM SUCC 40 MG IJ SOLR
10.0000 mg | Freq: Once | INTRAMUSCULAR | Status: AC
  Administered 2020-04-28: 10 mg via INTRAVENOUS

## 2020-04-28 MED ORDER — OMEPRAZOLE 10 MG PO CPDR
20.0000 mg | DELAYED_RELEASE_CAPSULE | ORAL | Status: DC | PRN
Start: 2020-04-28 — End: 2020-07-03

## 2020-04-28 NOTE — Discharge Summary (Signed)
Physician Discharge Summary  [name] FAO:[id number] DOB: Mar 08, 1986 DOA: 04/20/2020  PCP: [name], PA  Admit date: 04/20/2020 Discharge date: 04/28/2020  Admitted From: Home Disposition:  Home  Recommendations for Outpatient Follow-up:  1. Follow up with PCP in 1-2 weeks 2. Please obtain BMP/CBC in one week   Home Health:No Equipment/Devices:None  Discharge Condition:Stable CODE STATUS:Full Diet recommendation: Heart Healthy  Brief/Interim Summary: 34 y.o. female past medical history of morbid obesity, essential hypertension COVID-19 infection diagnosed 04/13/2020 comes into the ED on 04/20/2020 with severe dyspnea fevers tachycardia and tachypnea with IV remdesivir steroids and baricitinib  Discharge Diagnoses:  Principal Problem:   Acute hypoxemic respiratory failure due to COVID-19 [facility]) Active Problems:   Hypertension   Acid reflux   Obesity, Class III, BMI 40-49.9 (morbid obesity) ([place])  Acute respiratory failure with hypoxia due to COVID-19: She was started on 10 L of oxygen on admission  started on IV remdesivir steroids and baricitinib, she completed her course of IV remdesivir and steroids in house she was weaned to room air.  She was ambulated and saturations remained stable she was discharged in stable condition.  Microcytic anemia: Iron studies specially her ferritin was significantly elevated like due to COVID-19. She will follow-up with her primary care doctor as an outpatient to be evaluated.  Hypokalemia: Now resolved repeat orally.  Essential hypertension: No changes made to medication.    Discharge Instructions  Discharge Instructions    Diet - low sodium heart healthy   Complete by: As directed    Increase activity slowly   Complete by: As directed      Allergies as of 04/28/2020      Reactions   Percocet [oxycodone-acetaminophen] Hives, Rash      Medication List    TAKE these medications   amLODipine 10 MG  tablet Commonly known as: NORVASC Take 10 mg by mouth daily.   B-12 1000 MCG Subl Place 1,000 mcg under the tongue daily.   brompheniramine-pseudoephedrine-DM 30-2-10 MG/5ML syrup Take 5 mLs by mouth 4 (four) times daily as needed. What changed: reasons to take this   ergocalciferol 1.25 MG (50000 UT) capsule Commonly known as: VITAMIN D2 Take 50,000 Units by mouth once a week.   ibuprofen 200 MG tablet Commonly known as: ADVIL Take 400-600 mg by mouth every 6 (six) hours as needed for headache or moderate pain.   ipratropium 0.03 % nasal spray Commonly known as: ATROVENT Place 2 sprays into both nostrils 2 (two) times daily. What changed:   when to take this  reasons to take this   Olopatadine HCl 0.2 % Soln Apply 1 drop to eye daily as needed (irritation).   omeprazole 10 MG capsule Commonly known as: PRILOSEC Take 2 capsules (20 mg total) by mouth as needed.       Allergies  Allergen Reactions  . Percocet [Oxycodone-Acetaminophen] Hives and Rash    Consultations:  None   Procedures/Studies: DG Chest Port 1 View  Result Date: 04/20/2020 CLINICAL DATA:  Shortness of breath, headache and fever. EXAM: PORTABLE CHEST 1 VIEW COMPARISON:  11/07/2013 chest radiograph and prior. FINDINGS: Hypoinflated lungs. Patchy bilateral pulmonary airspace opacities. No pneumothorax or pleural effusion. Cardiomediastinal silhouette within normal limits. No acute osseous abnormality. IMPRESSION: Multifocal pneumonia. Electronically Signed   By: Primitivo Gauze M.D.   On: 04/20/2020 12:10     Subjective: New complaints.  Discharge Exam: Vitals:   04/28/20 0700 04/28/20 0725  BP:    Pulse:  Resp:    Temp:    SpO2: (!) 89% 96%   Vitals:   04/28/20 0422 04/28/20 0550 04/28/20 0700 04/28/20 0725  BP: (!) 146/98     Pulse: 92     Resp: (!) 22 18    Temp: 98.4 F (36.9 C)     TempSrc: Oral     SpO2: 93% 96% (!) 89% 96%  Weight:      Height:        General:  Pt is alert, awake, not in acute distress Cardiovascular: RRR, S1/S2 +, no rubs, no gallops Respiratory: CTA bilaterally, no wheezing, no rhonchi Abdominal: Soft, NT, ND, bowel sounds + Extremities: no edema, no cyanosis    The results of significant diagnostics from this hospitalization (including imaging, microbiology, ancillary and laboratory) are listed below for reference.     Microbiology: Recent Results (from the past 240 hour(s))  Culture, blood (Routine x 2)     Status: Abnormal   Collection Time: 04/20/20 12:00 PM   Specimen: BLOOD  Result Value Ref Range Status   Specimen Description   Final    BLOOD LEFT ANTECUBITAL Performed at [facility], [address], [place], [place] [address]    Special Requests   Final    BOTTLES DRAWN AEROBIC AND ANAEROBIC Blood Culture adequate volume Performed at [place] [facility] [address], [place], [place] [address]    Culture  Setup Time   Final    GRAM POSITIVE COCCI IN CLUSTERS ANAEROBIC BOTTLE ONLY CRITICAL RESULT CALLED TO, READ BACK BY AND VERIFIED WITH: PHARMD M SWAYNE [id number] AT 1245 BY CM    Culture (A)  Final    STAPHYLOCOCCUS EPIDERMIDIS THE SIGNIFICANCE OF ISOLATING THIS ORGANISM FROM A SINGLE SET OF BLOOD CULTURES WHEN MULTIPLE SETS ARE DRAWN IS UNCERTAIN. PLEASE NOTIFY THE MICROBIOLOGY DEPARTMENT WITHIN ONE WEEK IF SPECIATION AND SENSITIVITIES ARE REQUIRED. Performed at [facility], [place] [address], [place], [place] [address]    Report Status 04/23/2020 FINAL  Final  Blood Culture ID Panel (Reflexed)     Status: Abnormal   Collection Time: 04/20/20 12:00 PM  Result Value Ref Range Status   Enterococcus faecalis NOT DETECTED NOT DETECTED Final   Enterococcus Faecium NOT DETECTED NOT DETECTED Final   Listeria monocytogenes NOT DETECTED NOT DETECTED Final   Staphylococcus species DETECTED (A) NOT DETECTED Final    Comment: CRITICAL RESULT CALLED TO, READ BACK BY AND VERIFIED WITH: pharmd  m swayne [id number] at 1245 by cm    Staphylococcus aureus (BCID) NOT DETECTED NOT DETECTED Final   Staphylococcus epidermidis DETECTED (A) NOT DETECTED Final    Comment: CRITICAL RESULT CALLED TO, READ BACK BY AND VERIFIED WITH: PHARMD M SWAYNE [id number] AT 1245 BY CM    Staphylococcus lugdunensis NOT DETECTED NOT DETECTED Final   Streptococcus species NOT DETECTED NOT DETECTED Final   Streptococcus agalactiae NOT DETECTED NOT DETECTED Final   Streptococcus pneumoniae NOT DETECTED NOT DETECTED Final   Streptococcus pyogenes NOT DETECTED NOT DETECTED Final   A.calcoaceticus-baumannii NOT DETECTED NOT DETECTED Final   Bacteroides fragilis NOT DETECTED NOT DETECTED Final   Enterobacterales NOT DETECTED NOT DETECTED Final   Enterobacter cloacae complex NOT DETECTED NOT DETECTED Final   Escherichia coli NOT DETECTED NOT DETECTED Final   Klebsiella aerogenes NOT DETECTED NOT DETECTED Final   Klebsiella oxytoca NOT DETECTED NOT DETECTED Final   Klebsiella pneumoniae NOT DETECTED NOT DETECTED Final   Proteus species NOT DETECTED NOT DETECTED Final  Salmonella species NOT DETECTED NOT DETECTED Final   Serratia marcescens NOT DETECTED NOT DETECTED Final   Haemophilus influenzae NOT DETECTED NOT DETECTED Final   Neisseria meningitidis NOT DETECTED NOT DETECTED Final   Pseudomonas aeruginosa NOT DETECTED NOT DETECTED Final   Stenotrophomonas maltophilia NOT DETECTED NOT DETECTED Final   Candida albicans NOT DETECTED NOT DETECTED Final   Candida auris NOT DETECTED NOT DETECTED Final   Candida glabrata NOT DETECTED NOT DETECTED Final   Candida krusei NOT DETECTED NOT DETECTED Final   Candida parapsilosis NOT DETECTED NOT DETECTED Final   Candida tropicalis NOT DETECTED NOT DETECTED Final   Cryptococcus neoformans/gattii NOT DETECTED NOT DETECTED Final   Methicillin resistance mecA/C NOT DETECTED NOT DETECTED Final    Comment: Performed at [facility], [address], [place], [place] [address]   Blood Culture (routine x 2)     Status: None   Collection Time: 04/20/20 12:30 PM   Specimen: BLOOD  Result Value Ref Range Status   Specimen Description   Final    BLOOD RIGHT ANTECUBITAL Performed at [facility] [address] [place], [place] [address]    Special Requests   Final    BOTTLES DRAWN AEROBIC AND ANAEROBIC Blood Culture adequate volume Performed at [facility] [address], [place], [place] [address]    Culture   Final    NO GROWTH 5 DAYS Performed at [facility], [place] [address], [place],  [address]    Report Status 04/25/2020 FINAL  Final     Labs: BNP (last 3 results) No results for input(s): BNP in the last 8760 hours. Basic Metabolic Panel: Recent Labs  Lab 04/22/20 0512 04/23/20 0436 04/24/20 0450 04/27/20 0532  NA 135 136 137  --   K 3.7 3.6 4.1  --   CL 101 102 102  --   CO2 24 25 26   --   GLUCOSE 132* 134* 136*  --   BUN 15 18 17   --   CREATININE 0.65 0.66 0.71 0.63  CALCIUM 8.7* 8.7* 8.5*  --    Liver Function Tests: Recent Labs  Lab 04/22/20 0512 04/23/20 0436 04/24/20 0450  AST 22 23 24   ALT 19 20 26   ALKPHOS 40 41 38  BILITOT 0.4 0.3 0.4  PROT 7.4 7.2 6.7  ALBUMIN 3.1* 3.0* 2.9*   No results for input(s): LIPASE, AMYLASE in the last 168 hours. No results for input(s): AMMONIA in the last 168 hours. CBC: Recent Labs  Lab 04/22/20 0512 04/23/20 0436 04/24/20 0450  WBC 9.0 11.0* 11.1*  NEUTROABS 7.3 8.3* 8.6*  HGB 11.7* 11.6* 11.9*  HCT 37.0 36.6 37.1  MCV 79.9* 78.9* 79.6*  PLT 489* 569* 598*   Cardiac Enzymes: No results for input(s): CKTOTAL, CKMB, CKMBINDEX, TROPONINI in the last 168 hours. BNP: Invalid input(s): POCBNP CBG: No results for input(s): GLUCAP in the last 168 hours. D-Dimer Recent Labs    04/26/20 0920 04/27/20 0532  DDIMER 1.05* 0.73*   Hgb A1c No results for input(s): HGBA1C in the last 72 hours. Lipid Profile No results for  input(s): CHOL, HDL, LDLCALC, TRIG, CHOLHDL, LDLDIRECT in the last 72 hours. Thyroid function studies No results for input(s): TSH, T4TOTAL, T3FREE, THYROIDAB in the last 72 hours.  Invalid input(s): FREET3 Anemia work up No results for input(s): VITAMINB12, FOLATE, FERRITIN, TIBC, IRON, RETICCTPCT in the last 72 hours. Urinalysis    Component Value Date/Time   COLORURINE PINK (A)  04/20/2020 1245   APPEARANCEUR HAZY (A) 04/20/2020 1245   LABSPEC 1.002 (L) 04/20/2020 1245   PHURINE 6.0 04/20/2020 1245   GLUCOSEU NEGATIVE 04/20/2020 1245   HGBUR MODERATE (A) 04/20/2020 1245   BILIRUBINUR NEGATIVE 04/20/2020 1245   BILIRUBINUR negative 10/01/2019 1218   KETONESUR 5 (A) 04/20/2020 1245   PROTEINUR 100 (A) 04/20/2020 1245   UROBILINOGEN 0.2 10/01/2019 1218   UROBILINOGEN 1.0 03/26/2015 1053   NITRITE NEGATIVE 04/20/2020 1245   LEUKOCYTESUR SMALL (A) 04/20/2020 1245   Sepsis Labs Invalid input(s): PROCALCITONIN,  WBC,  LACTICIDVEN Microbiology Recent Results (from the past 240 hour(s))  Culture, blood (Routine x 2)     Status: Abnormal   Collection Time: 04/20/20 12:00 PM   Specimen: BLOOD  Result Value Ref Range Status   Specimen Description   Final    BLOOD LEFT ANTECUBITAL Performed at [facility], [place] [address], [place], [place] [address]    Special Requests   Final    BOTTLES DRAWN AEROBIC AND ANAEROBIC Blood Culture adequate volume Performed at [facility] [address], [place], [place] [address]    Culture  Setup Time   Final    GRAM POSITIVE COCCI IN CLUSTERS ANAEROBIC BOTTLE ONLY CRITICAL RESULT CALLED TO, READ BACK BY AND VERIFIED WITH: PHARMD M SWAYNE [id number] AT 1245 BY CM    Culture (A)  Final    STAPHYLOCOCCUS EPIDERMIDIS THE SIGNIFICANCE OF ISOLATING THIS ORGANISM FROM A SINGLE SET OF BLOOD CULTURES WHEN MULTIPLE SETS ARE DRAWN IS UNCERTAIN. PLEASE NOTIFY THE MICROBIOLOGY DEPARTMENT WITHIN ONE WEEK IF SPECIATION AND SENSITIVITIES  ARE REQUIRED. Performed at [facility], [place] [address] [place], [place] [address]    Report Status 04/23/2020 FINAL  Final  Blood Culture ID Panel (Reflexed)     Status: Abnormal   Collection Time: 04/20/20 12:00 PM  Result Value Ref Range Status   Enterococcus faecalis NOT DETECTED NOT DETECTED Final   Enterococcus Faecium NOT DETECTED NOT DETECTED Final   Listeria monocytogenes NOT DETECTED NOT DETECTED Final   Staphylococcus species DETECTED (A) NOT DETECTED Final    Comment: CRITICAL RESULT CALLED TO, READ BACK BY AND VERIFIED WITH: pharmd m swayne [id number] at 1245 by cm    Staphylococcus aureus (BCID) NOT DETECTED NOT DETECTED Final   Staphylococcus epidermidis DETECTED (A) NOT DETECTED Final    Comment: CRITICAL RESULT CALLED TO, READ BACK BY AND VERIFIED WITH: PHARMD M SWAYNE [id number] AT 1245 BY CM    Staphylococcus lugdunensis NOT DETECTED NOT DETECTED Final   Streptococcus species NOT DETECTED NOT DETECTED Final   Streptococcus agalactiae NOT DETECTED NOT DETECTED Final   Streptococcus pneumoniae NOT DETECTED NOT DETECTED Final   Streptococcus pyogenes NOT DETECTED NOT DETECTED Final   A.calcoaceticus-baumannii NOT DETECTED NOT DETECTED Final   Bacteroides fragilis NOT DETECTED NOT DETECTED Final   Enterobacterales NOT DETECTED NOT DETECTED Final   Enterobacter cloacae complex NOT DETECTED NOT DETECTED Final   Escherichia coli NOT DETECTED NOT DETECTED Final   Klebsiella aerogenes NOT DETECTED NOT DETECTED Final   Klebsiella oxytoca NOT DETECTED NOT DETECTED Final   Klebsiella pneumoniae NOT DETECTED NOT DETECTED Final   Proteus species NOT DETECTED NOT DETECTED Final   Salmonella species NOT DETECTED NOT DETECTED Final   Serratia marcescens NOT DETECTED NOT DETECTED Final   Haemophilus influenzae NOT DETECTED NOT DETECTED Final   Neisseria meningitidis NOT DETECTED NOT DETECTED Final   Pseudomonas aeruginosa NOT DETECTED NOT DETECTED Final   Stenotrophomonas  maltophilia NOT DETECTED NOT DETECTED Final   Candida albicans NOT DETECTED NOT DETECTED Final   Candida auris NOT DETECTED NOT DETECTED Final   Candida glabrata NOT DETECTED NOT DETECTED Final   Candida krusei NOT DETECTED NOT DETECTED Final   Candida parapsilosis NOT DETECTED NOT DETECTED Final   Candida tropicalis NOT DETECTED NOT DETECTED Final   Cryptococcus neoformans/gattii NOT DETECTED NOT DETECTED Final   Methicillin resistance mecA/C NOT DETECTED NOT DETECTED Final    Comment: Performed at [facility], [place] [address], [place], [place] [address]  Blood Culture (routine x 2)     Status: None   Collection Time: 04/20/20 12:30 PM   Specimen: BLOOD  Result Value Ref Range Status   Specimen Description   Final    BLOOD RIGHT ANTECUBITAL Performed at [facility] [address], [place], [place] [address]    Special Requests   Final    BOTTLES DRAWN AEROBIC AND ANAEROBIC Blood Culture adequate volume Performed at [facility] [address], [place], [place] [address]    Culture   Final    NO GROWTH 5 DAYS Performed at [facility], [place] [address], [place], [place] [address]    Report Status 04/25/2020 FINAL  Final     Time coordinating discharge: Over 30 minutes  SIGNED:   Charlynne Cousins, MD  [facility] 04/28/2020, 9:48 AM Pager   If 7PM-7AM, please contact night-coverage [email or web address] Password [email or web address]

## 2020-04-28 NOTE — Progress Notes (Signed)
2L via [place] reapplied per md order.

## 2020-04-28 NOTE — Progress Notes (Signed)
Patient transported via wheelchair to front entrance and private vehicle. Patient has her personal belongings and discharge paperwork. Verbalizes understanding of instructions and denies any new questions or concerns.

## 2020-04-28 NOTE — Progress Notes (Signed)
Patient maintaining >90% on room air. Denies any new questions or concerns. Discharge paperwork provided with medication instructions. Tele removed. IV removed from LFA tip intact, site WDL. No other needs at this time. Discharge planned for home when cg arrives for transport.

## 2020-05-01 ENCOUNTER — Encounter: Payer: Self-pay | Admitting: Obstetrics and Gynecology

## 2020-05-01 DIAGNOSIS — D259 Leiomyoma of uterus, unspecified: Secondary | ICD-10-CM

## 2020-05-01 HISTORY — DX: Leiomyoma of uterus, unspecified: D25.9

## 2020-05-07 ENCOUNTER — Inpatient Hospital Stay: Payer: Medicaid Other | Admitting: Hematology

## 2020-05-07 ENCOUNTER — Inpatient Hospital Stay: Payer: Medicaid Other

## 2020-05-07 ENCOUNTER — Telehealth: Payer: Self-pay | Admitting: *Deleted

## 2020-05-07 NOTE — Telephone Encounter (Signed)
Patient did not call/no show for lab and MD appt w/Dr. Irene Limbo on 11/29. Sent schedule message to contact patient to reschedule at her convenience.

## 2020-05-14 NOTE — Pre-Procedure Instructions (Signed)
Your procedure is scheduled on Monday, December 13th at 7:30a.m.  Report to [facility] Main Entrance "A" at 5:30 A.M., and check in at the Admitting office.  Call this number if you have problems the morning of surgery:  [phone]  Call [phone] if you have any questions prior to your surgery date Monday-Friday 8am-4pm    Remember:  Do not eat after midnight the night before your surgery  You may drink clear liquids until 4:30a.m. the morning of your surgery.   Clear liquids allowed are: Water, Non-Citrus Juices (without pulp), Carbonated Beverages, Clear Tea, Black Coffee Only, and Gatorade    Take these medicines the morning of surgery with A SIP OF WATER  amLODipine (NORVASC)  omeprazole (PRILOSEC) cycloSPORINE (RESTASIS)-eye drops as needed Olopatadine HCl -eye drops as needed  As of today, STOP taking any Aspirin (unless otherwise instructed by your surgeon) Aleve, Naproxen, Ibuprofen, Motrin, Advil, Goody's, BC's, all herbal medications, fish oil, and all vitamins.                     Do not wear jewelry, make up, or nail polish            Do not wear lotions, powders, perfumes, or deodorant.            Do not shave 48 hours prior to surgery.             Do not bring valuables to the hospital.            [facility] is not responsible for any belongings or valuables.  Do NOT Smoke (Tobacco/Vaping) or drink Alcohol 24 hours prior to your procedure If you use a CPAP at night, you may bring all equipment for your overnight stay.   Contacts, glasses, dentures or bridgework may not be worn into surgery.      For patients admitted to the hospital, discharge time will be determined by your treatment team.   Patients discharged the day of surgery will not be allowed to drive home, and someone needs to stay with them for 24 hours.    Special instructions:   [place]- Preparing For Surgery  Before surgery, you can play an important role. Because skin is not sterile,  your skin needs to be as free of germs as possible. You can reduce the number of germs on your skin by washing with CHG (chlorahexidine gluconate) Soap before surgery.  CHG is an antiseptic cleaner which kills germs and bonds with the skin to continue killing germs even after washing.    Oral Hygiene is also important to reduce your risk of infection.  Remember - BRUSH YOUR TEETH THE MORNING OF SURGERY WITH YOUR REGULAR TOOTHPASTE  Please do not use if you have an allergy to CHG or antibacterial soaps. If your skin becomes reddened/irritated stop using the CHG.  Do not shave (including legs and underarms) for at least 48 hours prior to first CHG shower. It is OK to shave your face.  Please follow these instructions carefully.   1. Shower the NIGHT BEFORE SURGERY and the MORNING OF SURGERY with CHG Soap.   2. If you chose to wash your hair, wash your hair first as usual with your normal shampoo.  3. After you shampoo, rinse your hair and body thoroughly to remove the shampoo.  4. Use CHG as you would any other liquid soap. You can apply CHG directly to the skin and wash gently with a scrungie or a  clean washcloth.   5. Apply the CHG Soap to your body ONLY FROM THE NECK DOWN.  Do not use on open wounds or open sores. Avoid contact with your eyes, ears, mouth and genitals (private parts). Wash Face and genitals (private parts)  with your normal soap.   6. Wash thoroughly, paying special attention to the area where your surgery will be performed.  7. Thoroughly rinse your body with warm water from the neck down.  8. DO NOT shower/wash with your normal soap after using and rinsing off the CHG Soap.  9. Pat yourself dry with a CLEAN TOWEL.  10. Wear CLEAN PAJAMAS to bed the night before surgery  11. Place CLEAN SHEETS on your bed the night of your first shower and DO NOT SLEEP WITH PETS.   Day of Surgery: Wear Clean/Comfortable clothing the morning of surgery Do not apply any  deodorants/lotions.   Remember to brush your teeth WITH YOUR REGULAR TOOTHPASTE.   Please read over the following fact sheets that you were given.

## 2020-05-15 ENCOUNTER — Other Ambulatory Visit: Payer: Self-pay

## 2020-05-15 ENCOUNTER — Encounter (HOSPITAL_COMMUNITY): Payer: Self-pay

## 2020-05-15 ENCOUNTER — Encounter (HOSPITAL_COMMUNITY)
Admission: RE | Admit: 2020-05-15 | Discharge: 2020-05-15 | Disposition: A | Discharge status: Home / Self Care | Payer: Medicaid Other | Source: Ambulatory Visit | Attending: Obstetrics and Gynecology | Admitting: Obstetrics and Gynecology

## 2020-05-15 DIAGNOSIS — Z01812 Encounter for preprocedural laboratory examination: Secondary | ICD-10-CM | POA: Insufficient documentation

## 2020-05-15 DIAGNOSIS — E876 Hypokalemia: Secondary | ICD-10-CM | POA: Diagnosis not present

## 2020-05-15 DIAGNOSIS — R0602 Shortness of breath: Secondary | ICD-10-CM | POA: Insufficient documentation

## 2020-05-15 DIAGNOSIS — U099 Post covid-19 condition, unspecified: Secondary | ICD-10-CM | POA: Insufficient documentation

## 2020-05-15 LAB — CBC
HCT: 38.3 % (ref 36.0–46.0)
Hemoglobin: 12.7 g/dL (ref 12.0–15.0)
MCH: 27.3 pg (ref 26.0–34.0)
MCHC: 33.2 g/dL (ref 30.0–36.0)
MCV: 82.2 fL (ref 80.0–100.0)
Platelets: 363 10*3/uL (ref 150–400)
RBC: 4.66 MIL/uL (ref 3.87–5.11)
RDW: 21.3 % — ABNORMAL HIGH (ref 11.5–15.5)
WBC: 6.6 10*3/uL (ref 4.0–10.5)
nRBC: 0 % (ref 0.0–0.2)

## 2020-05-15 LAB — COMPREHENSIVE METABOLIC PANEL
ALT: 16 U/L (ref 0–44)
AST: 19 U/L (ref 15–41)
Albumin: 3.3 g/dL — ABNORMAL LOW (ref 3.5–5.0)
Alkaline Phosphatase: 61 U/L (ref 38–126)
Anion gap: 9 (ref 5–15)
BUN: <5 mg/dL — ABNORMAL LOW (ref 6–20)
CO2: 25 mmol/L (ref 22–32)
Calcium: 8.8 mg/dL — ABNORMAL LOW (ref 8.9–10.3)
Chloride: 102 mmol/L (ref 98–111)
Creatinine, Ser: 0.64 mg/dL (ref 0.44–1.00)
GFR, Estimated: >60 mL/min (ref >60)
Glucose, Bld: 102 mg/dL — ABNORMAL HIGH (ref 70–99)
Potassium: 2.7 mmol/L — CL (ref 3.5–5.1)
Sodium: 136 mmol/L (ref 135–145)
Total Bilirubin: 0.3 mg/dL (ref 0.3–1.2)
Total Protein: 7 g/dL (ref 6.5–8.1)

## 2020-05-15 LAB — NO BLOOD PRODUCTS

## 2020-05-15 NOTE — Progress Notes (Signed)
CRITICAL VALUE ALERT  Critical Value:  K+: 2.7  Date & Time Notied:  05/15/20 1319  Provider Notified: Dr. Sandford Craze. Critical result called in and left with Karena Addison at [facility].  Orders Received/Actions taken: No new orders at this time. Left callback number if needed.   Jacqlyn Larsen, RN

## 2020-05-15 NOTE — Progress Notes (Signed)
PCP - Raelyn Number, PA-[facility] Cardiologist - denies  Chest x-ray - n/a EKG - 04/20/20 Stress Test - denies ECHO - ? Requested records from [facility].  Cardiac Cath - denies  Sleep Study - OSA+ CPAP - uses CPAP nightly  Blood Thinner Instructions:n/a Aspirin Instructions: n/a  ERAS Protcol -Yes, pt to stop clears by 0430 DOS PRE-SURGERY Ensure or G2- none ordered.  COVID TEST- N/A. Pt tested positive for Covid on 04/13/20. Re-test not indicated.    Anesthesia review: Yes, pending echo report from PCP. Pt states that this was done in office when she had c/o CP. Pt denies is was an EKG. Pt was told the report was normal. No issues with CP since.  Patient denies shortness of breath, fever, cough and chest pain at PAT appointment   All instructions explained to the patient, with a verbal understanding of the material. Patient agrees to go over the instructions while at home for a better understanding. Patient also instructed to self quarantine after being tested for COVID-19. The opportunity to ask questions was provided.  Pt is a Jehov[employer]'s Witness. Blood product refusal faxed to blood bank.    Coronavirus Screening  Have you experienced the following symptoms:  Cough yes/no: No Fever (>100.17F)  yes/no: No Runny nose yes/no: No Sore throat yes/no: No Difficulty breathing/shortness of breath  yes/no: No  Have you or a family member traveled in the last 14 days and where? yes/no: No   If the patient indicates "YES" to the above questions, their PAT will be rescheduled to limit the exposure to others and, the surgeon will be notified. THE PATIENT WILL NEED TO BE ASYMPTOMATIC FOR 14 DAYS.   If the patient is not experiencing any of these symptoms, the PAT nurse will instruct them to NOT bring anyone with them to their appointment since they may have these symptoms or traveled as well.   Please remind your patients and families that hospital  visitation restrictions are in effect and the importance of the restrictions.

## 2020-05-16 NOTE — Progress Notes (Signed)
Anesthesia Chart Review:  I spoke with the patient regarding her recent hospitalization for Covid pneumonia (discharged November 20).  She states that she is getting better, however she is not back at her prehospitalization baseline.  She says she has to take stairs more slowly because she gets winded, and she was somewhat short of breath when she arrived at preadmission testing due to to the walk from the entrance.  She says that prior to Covid she did not have these issues.  She has no shortness of breath at rest, has no chest pain, is otherwise feeling well.  She has not yet been seen by her primary care provider since discharge.  We discussed that she is at some increased risk for complications from anesthesia due to not being fully recovered from Covid.  I recommend strongly that she call her primary care and be seen ASAP for evaluation post discharge.  Patient verbalized understanding, states she is willing to postpone the procedure if advised.    Hypokalemia on preop labs, potassium 2.7.  She was prescribed potassium repletion by her surgeon.  Blood pressure not well controlled at PAT, 153/109.  She says she has blood pressure cuff at home and will continue to monitor, she was advised to follow-up with primary care for means elevated.  Her case was discussed with anesthesiologists Dr. Elgie Congo and Dr. Valma Cava.  Because of her recent hospitalization for treatment Covid with respiratory failure it was recommended that she wait 8 weeks before undergoing elective surgery.  I called and spoke with patient.  She understood this recommendation and was appreciative for the input.  I also called and spoke with surgeon's office, patient to be rescheduled in accordance with anesthesia recommendations.  TTE 08/27/2018 (copy in chart): 1.  Normal LV size with normal wall motion.  LVEF 79%. 2.  The right ventricle is normal in size and contractility. 3.  There is evidence of grade 1 left ventricular diastolic  dysfunction. 4.  The right atrium is normal in size.  The left atrium is normal in size. 5.  The aortic root is normal in size. 6.  The tricuspid, pulmonic, aortic and mitral valves are morphologically normal and open adequately. 7.  The inter atrial and interventricular septum are intact. 8.  Color flow, pulse and continuous wave Doppler interrogation reveals trace mitral and tricuspid regurgitation.  The estimated RVSP is 26 mmHg, which is normal.  The peak and mean transaortic valve gradients are 16 3 mmHg, respectively. 9.  There is no pericardial effusion present.  The pericardium is normal. 10.  The IVC is normal in size and has normal respiratory variation.   Wynonia Musty [facility] Short Stay Center/Anesthesiology Phone [phone] 05/17/2020 1:57 PM

## 2020-05-21 DIAGNOSIS — D259 Leiomyoma of uterus, unspecified: Secondary | ICD-10-CM | POA: Diagnosis present

## 2020-06-11 ENCOUNTER — Inpatient Hospital Stay: Payer: Medicaid Other | Attending: Hematology

## 2020-06-11 ENCOUNTER — Other Ambulatory Visit: Payer: Self-pay

## 2020-06-11 ENCOUNTER — Inpatient Hospital Stay (HOSPITAL_BASED_OUTPATIENT_CLINIC_OR_DEPARTMENT_OTHER): Payer: Medicaid Other | Admitting: Hematology

## 2020-06-11 VITALS — BP 168/96 | HR 86 | Temp 97.8°F | Resp 18 | Ht 65.0 in | Wt 290.4 lb

## 2020-06-11 DIAGNOSIS — Z87891 Personal history of nicotine dependence: Secondary | ICD-10-CM | POA: Insufficient documentation

## 2020-06-11 DIAGNOSIS — Z791 Long term (current) use of non-steroidal anti-inflammatories (NSAID): Secondary | ICD-10-CM | POA: Diagnosis not present

## 2020-06-11 DIAGNOSIS — F418 Other specified anxiety disorders: Secondary | ICD-10-CM | POA: Insufficient documentation

## 2020-06-11 DIAGNOSIS — F5089 Other specified eating disorder: Secondary | ICD-10-CM | POA: Diagnosis not present

## 2020-06-11 DIAGNOSIS — F419 Anxiety disorder, unspecified: Secondary | ICD-10-CM | POA: Insufficient documentation

## 2020-06-11 DIAGNOSIS — E538 Deficiency of other specified B group vitamins: Secondary | ICD-10-CM | POA: Diagnosis not present

## 2020-06-11 DIAGNOSIS — E669 Obesity, unspecified: Secondary | ICD-10-CM | POA: Diagnosis not present

## 2020-06-11 DIAGNOSIS — Z87440 Personal history of urinary (tract) infections: Secondary | ICD-10-CM | POA: Insufficient documentation

## 2020-06-11 DIAGNOSIS — D500 Iron deficiency anemia secondary to blood loss (chronic): Secondary | ICD-10-CM | POA: Diagnosis not present

## 2020-06-11 DIAGNOSIS — D509 Iron deficiency anemia, unspecified: Secondary | ICD-10-CM

## 2020-06-11 DIAGNOSIS — G4730 Sleep apnea, unspecified: Secondary | ICD-10-CM | POA: Insufficient documentation

## 2020-06-11 DIAGNOSIS — N920 Excessive and frequent menstruation with regular cycle: Secondary | ICD-10-CM | POA: Diagnosis not present

## 2020-06-11 DIAGNOSIS — Z8616 Personal history of COVID-19: Secondary | ICD-10-CM | POA: Diagnosis not present

## 2020-06-11 DIAGNOSIS — K219 Gastro-esophageal reflux disease without esophagitis: Secondary | ICD-10-CM | POA: Insufficient documentation

## 2020-06-11 DIAGNOSIS — Z79899 Other long term (current) drug therapy: Secondary | ICD-10-CM | POA: Insufficient documentation

## 2020-06-11 DIAGNOSIS — I10 Essential (primary) hypertension: Secondary | ICD-10-CM | POA: Insufficient documentation

## 2020-06-11 LAB — CBC WITH DIFFERENTIAL/PLATELET
Abs Immature Granulocytes: 0.02 10*3/uL (ref 0.00–0.07)
Basophils Absolute: 0 10*3/uL (ref 0.0–0.1)
Basophils Relative: 0 %
Eosinophils Absolute: 0.2 10*3/uL (ref 0.0–0.5)
Eosinophils Relative: 2 %
HCT: 35.8 % — ABNORMAL LOW (ref 36.0–46.0)
Hemoglobin: 11.9 g/dL — ABNORMAL LOW (ref 12.0–15.0)
Immature Granulocytes: 0 %
Lymphocytes Relative: 30 %
Lymphs Abs: 3.2 10*3/uL (ref 0.7–4.0)
MCH: 27.7 pg (ref 26.0–34.0)
MCHC: 33.2 g/dL (ref 30.0–36.0)
MCV: 83.3 fL (ref 80.0–100.0)
Monocytes Absolute: 0.7 10*3/uL (ref 0.1–1.0)
Monocytes Relative: 7 %
Neutro Abs: 6.3 10*3/uL (ref 1.7–7.7)
Neutrophils Relative %: 61 %
Platelets: 305 10*3/uL (ref 150–400)
RBC: 4.3 MIL/uL (ref 3.87–5.11)
RDW: 16.3 % — ABNORMAL HIGH (ref 11.5–15.5)
WBC: 10.4 10*3/uL (ref 4.0–10.5)
nRBC: 0 % (ref 0.0–0.2)

## 2020-06-11 LAB — IRON AND TIBC
Iron: 55 ug/dL (ref 41–142)
Saturation Ratios: 20 % — ABNORMAL LOW (ref 21–57)
TIBC: 276 ug/dL (ref 236–444)
UIBC: 221 ug/dL (ref 120–384)

## 2020-06-11 LAB — FERRITIN: Ferritin: 55 ng/mL (ref 11–307)

## 2020-06-11 NOTE — Progress Notes (Signed)
HEMATOLOGY/ONCOLOGY CONSULTATION NOTE  Date of Service: 06/11/2020  Patient Care Team: [name], PA as PCP - General (Physician Assistant)  CHIEF COMPLAINTS/PURPOSE OF CONSULTATION:  IDA  HISTORY OF PRESENTING ILLNESS:  [name] is a wonderful 36 y.o. female who has been referred to [place] by Dr. Melba Coon for evaluation and management of iron deficiency anemia. The pt reports that she is doing well overall.   The pt reports that the first time that she was told that she was anemic was a few months ago. Her menstrual cycles typically last six days, with four of those being especially heavy. Pt was recently told that she has a fibroid and has a consultation for surgical resection. Pt was given PO Iron to take, but has not, as she has anxiety surrounding taking medications. Pt has been experiencing fatigue, lightheadedness, dizziness, ice cravings, and restless legs.   She has three children, ages ranging from 61 to 37. Pt has had a tubal ligation. Pt is controlling her HTN with medication and has recently restarted using her CPAP machine. She endorses improved energy levels when using her CPAP regularly. Pt was raised as a Jehovah's Witness and is very hesitant to receive blood products.   Pt had hives after taking Percocet. This has made her cautious about taking medications.  Most recent lab results (01/17/2020) of CBC is as follows: all values are WNL except for RBC at 3.75, Hgb at 8.3, HCT at 28.1, MCV at 75, MCH at 22.1, MCH at 29.5, RDW at 16.8.  On review of systems, pt reports dizziness, lightheadedness, ice cravings, restless legs, anxiety and denies abdominal pain, leg swelling and any other symptoms.   On PMHx the pt reports HTN, Sleep Apnea, C-section x3, Tubal Ligation, Anxiety. On Social Hx the pt reports that she is a non-smoker and does not drink much alcohol.   INTERVAL HISTORY:  [name] is a wonderful 35 y.o. female who is here for evaluation and  management of iron deficiency anemia. The patient's last visit with [place] was on 03/06/2020. The pt reports that she is doing well overall.  The pt reports that she was hospitalized with COVID19 for eight days in November. She received steroids and antibody treatment. She feels back to her baseline at this time. Her menstrual cycles continue to last 6-7 days with several days being quite heavy. The pt is scheduled for an abdominal myomectomy on January 31st.   Lab results today (06/11/20) of CBC w/diff is as follows: all values are WNL except for Hgb at 11.9, HCT at 35.8, RDW at 16.3. 06/11/2020 Iron Panel is as follows: Iron at 55, TIBC at 276, Sat Ratios at 20, UIBC at 221 06/11/2020 Ferritin at 55  On review of systems, pt reports heavy menstrual cycles and denies lightheadedness, dizziness, fatigue, ice cravings, abdominal pain, constipation, diarrhea and any other symptoms.   MEDICAL HISTORY:  Past Medical History:  Diagnosis Date  . Abnormal Pap smear    colposcopy, mild dysplasia, HPV  . Acid reflux   . Anxiety    on meds- stable  . Bronchitis   . Chlamydia   . Depression    on meds for bipolar  . Fibroid   . Gonorrhea   . History of PID   . Hypertension   . Obesity   . Panic attack   . Pregnancy induced hypertension   . Sleep apnea    hasnt uses CPAP x 4 years  . Trichomonas   .  Urinary tract infection   . Uterine leiomyoma 05/01/2020    SURGICAL HISTORY: Past Surgical History:  Procedure Laterality Date  . ABDOMINAL SURGERY    . CESAREAN SECTION  05/02/03,05/04/08,10/23/10   x3  . CHOLECYSTECTOMY N/A 10/07/2012   Procedure: LAPAROSCOPIC CHOLECYSTECTOMY WITH INTRAOPERATIVE CHOLANGIOGRAM;  Surgeon: Imogene Burn. Georgette Dover, MD;  Location: [facility];  Service: General;  Laterality: N/A;  . ESOPHAGOGASTRODUODENOSCOPY (EGD) WITH PROPOFOL N/A 10/08/2018   Procedure: ESOPHAGOGASTRODUODENOSCOPY (EGD) WITH PROPOFOL;  Surgeon: Carol Ada, MD;  Location: [facility];  Service: Endoscopy;   Laterality: N/A;  . FOOT SURGERY    . HERNIA REPAIR    . INSERTION OF MESH N/A 12/02/2012   Procedure: INSERTION OF MESH;  Surgeon: Adin Hector, MD;  Location: [facility];  Service: General;  Laterality: N/A;  . TUBAL LIGATION    . UMBILICAL HERNIA REPAIR N/A 12/02/2012   Procedure: LAPAROSCOPIC UMBILICAL HERNIA;  Surgeon: Adin Hector, MD;  Location: [facility];  Service: General;  Laterality: N/A;  . WISDOM TOOTH EXTRACTION      SOCIAL HISTORY: Social History   Socioeconomic History  . Marital status: Single    Spouse name: Not on file  . Number of children: Not on file  . Years of education: Not on file  . Highest education level: Not on file  Occupational History  . Not on file  Tobacco Use  . Smoking status: Former Smoker    Packs/day: 0.25    Years: 1.00    Pack years: 0.25    Types: Cigarettes  . Smokeless tobacco: Never Used  Vaping Use  . Vaping Use: Never used  Substance and Sexual Activity  . Alcohol use: No    Comment: socially  . Drug use: No  . Sexual activity: Yes    Birth control/protection: Surgical    Comment: Last intercourse 2 nights ago.  Other Topics Concern  . Not on file  Social History Narrative  . Not on file   Social Determinants of Health   Financial Resource Strain: Not on file  Food Insecurity: Not on file  Transportation Needs: Not on file  Physical Activity: Not on file  Stress: Not on file  Social Connections: Not on file  Intimate Partner Violence: Not on file    FAMILY HISTORY: Family History  Problem Relation Age of Onset  . Hypertension Mother   . Heart disease Father   . Stroke Father   . Other Neg Hx     ALLERGIES:  is allergic to percocet [oxycodone-acetaminophen].  MEDICATIONS:  Current Outpatient Medications  Medication Sig Dispense Refill  . amLODipine (NORVASC) 10 MG tablet Take 10 mg by mouth daily.    . brompheniramine-pseudoephedrine-DM 30-2-10 MG/5ML syrup Take 5 mLs by mouth 4 (four) times daily as  needed. (Patient not taking: Reported on 05/11/2020) 140 mL 0  . Cyanocobalamin (B-12) 1000 MCG SUBL Place 1,000 mcg under the tongue daily. 30 tablet 5  . cycloSPORINE (RESTASIS) 0.05 % ophthalmic emulsion Place 1 drop into both eyes 2 (two) times daily as needed (dry eyes).    . ergocalciferol (VITAMIN D2) 1.25 MG (50000 UT) capsule Take 50,000 Units by mouth once a week.     Marland Kitchen ibuprofen (ADVIL) 200 MG tablet Take 400-600 mg by mouth every 6 (six) hours as needed for headache or moderate pain.    Marland Kitchen ipratropium (ATROVENT) 0.03 % nasal spray Place 2 sprays into both nostrils 2 (two) times daily. (Patient not taking: Reported on 05/11/2020) 30 mL 0  .  Olopatadine HCl 0.2 % SOLN Apply 1 drop to eye daily as needed (irritation).     Marland Kitchen omeprazole (PRILOSEC) 10 MG capsule Take 2 capsules (20 mg total) by mouth as needed. (Patient not taking: Reported on 05/11/2020)    . omeprazole (PRILOSEC) 20 MG capsule Take 20 mg by mouth daily as needed (acid reflux).      No current facility-administered medications for this visit.    REVIEW OF SYSTEMS:   A 10+ POINT REVIEW OF SYSTEMS WAS OBTAINED including neurology, dermatology, psychiatry, cardiac, respiratory, lymph, extremities, GI, GU, Musculoskeletal, constitutional, breasts, reproductive, HEENT.  All pertinent positives are noted in the HPI.  All others are negative.   PHYSICAL EXAMINATION: ECOG PERFORMANCE STATUS: 1 - Symptomatic but completely ambulatory  . Vitals:   06/11/20 1147  BP: (!) 168/96  Pulse: 86  Resp: 18  Temp: 97.8 F (36.6 C)  SpO2: 99%   Filed Weights   06/11/20 1147  Weight: 290 lb 6.4 oz (131.7 kg)   .Body mass index is 48.33 kg/m.   GENERAL:alert, in no acute distress and comfortable SKIN: no acute rashes, no significant lesions EYES: conjunctiva are pink and non-injected, sclera anicteric OROPHARYNX: MMM, no exudates, no oropharyngeal erythema or ulceration NECK: supple, no JVD LYMPH:  no palpable lymphadenopathy in  the cervical, axillary or inguinal regions LUNGS: clear to auscultation b/l with normal respiratory effort HEART: regular rate & rhythm ABDOMEN:  normoactive bowel sounds , non tender, not distended. No palpable hepatosplenomegaly.  Extremity: no pedal edema PSYCH: alert & oriented x 3 with fluent speech NEURO: no focal motor/sensory deficits  LABORATORY DATA:  I have reviewed the data as listed  . CBC Latest Ref Rng & Units 06/11/2020 05/15/2020 04/24/2020  WBC 4.0 - 10.5 K/uL 10.4 6.6 11.1(H)  Hemoglobin 12.0 - 15.0 g/dL 11.9(L) 12.7 11.9(L)  Hematocrit 36.0 - 46.0 % 35.8(L) 38.3 37.1  Platelets 150 - 400 K/uL 305 363 598(H)   . CBC    Component Value Date/Time   WBC 10.4 06/11/2020 1136   RBC 4.30 06/11/2020 1136   HGB 11.9 (L) 06/11/2020 1136   HCT 35.8 (L) 06/11/2020 1136   PLT 305 06/11/2020 1136   MCV 83.3 06/11/2020 1136   MCH 27.7 06/11/2020 1136   MCHC 33.2 06/11/2020 1136   RDW 16.3 (H) 06/11/2020 1136   LYMPHSABS 3.2 06/11/2020 1136   MONOABS 0.7 06/11/2020 1136   EOSABS 0.2 06/11/2020 1136   BASOSABS 0.0 06/11/2020 1136    . CMP Latest Ref Rng & Units 05/15/2020 04/27/2020 04/24/2020  Glucose 70 - 99 mg/dL 102(H) - 136(H)  BUN 6 - 20 mg/dL <5(L) - 17  Creatinine 0.44 - 1.00 mg/dL 0.64 0.63 0.71  Sodium 135 - 145 mmol/L 136 - 137  Potassium 3.5 - 5.1 mmol/L 2.7(LL) - 4.1  Chloride 98 - 111 mmol/L 102 - 102  CO2 22 - 32 mmol/L 25 - 26  Calcium 8.9 - 10.3 mg/dL 8.8(L) - 8.5(L)  Total Protein 6.5 - 8.1 g/dL 7.0 - 6.7  Total Bilirubin 0.3 - 1.2 mg/dL 0.3 - 0.4  Alkaline Phos 38 - 126 U/L 61 - 38  AST 15 - 41 U/L 19 - 24  ALT 0 - 44 U/L 16 - 26    Lab Results  Component Value Date   IRON 55 06/11/2020   TIBC 276 06/11/2020   IRONPCTSAT 20 (L) 06/11/2020   (Iron and TIBC)  Lab Results  Component Value Date   FERRITIN 55 06/11/2020  RADIOGRAPHIC STUDIES: I have personally reviewed the radiological images as listed and agreed with the findings  in the report. No results found.  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 -Strongly recommended pt receive the COVID19 vaccines beginning three months after her monoclonal antibody treatment.  -Advised pt that if the myomectomy slows her menstrual losses we may not need as much IV Iron replacement.  -Will repeat IV Iron as needed. Goal Ferritin >50 -Continue limiting use of NSAIDS  -Continue daily Vitamin B12 SL -Will see back in 6 months with labs     5) B12 deficiency B12 -- levels 202 -SL B12 1000 mcg daily  FOLLOW UP: RTC with Dr Candise Che with labs in 6 months  The total time spent in the appt was 20 minutes and more than 50% was on counseling and direct patient cares.  All of the patient's questions were answered with apparent satisfaction. The patient knows to call the clinic with any problems, questions or concerns.    Wyvonnia Lora MD MS AAHIVMS [facility] Hematology/Oncology Physician [facility]  (Office):       [phone] (Work cell):  [phone] (Fax):           [phone]  06/11/2020 2:49 PM  I, Carollee Herter, am acting as a scribe for Dr. Wyvonnia Lora.   .I have reviewed the above documentation for accuracy and completeness, and I agree with the above. Johney Maine MD

## 2020-07-02 NOTE — Pre-Procedure Instructions (Signed)
DIVINA NEALE  07/02/2020    Your procedure is scheduled on Mon., Jan. 31, 2022 from 7:30AM-10:50AM.  Report to [facility] Entrance "A" at 5:30AM  Call this number if you have problems the morning of surgery:  [phone]   Remember:  Do not eat after midnight on Jan. 30th  You may drink clear liquids until 3 hours (4:30AM) prior to surgery time.  Clear liquids allowed are: Water, Juice(non-citric, no pulp), Black Coffee(no dairy or creamer), Clear Tea(no dairy or creamer), Carbonated Beverages, Gatorade, Plain Jell-O, Plain Popsicles.    Take these medicines the morning of surgery with A SIP OF WATER: AmLODipine (NORVASC)  If Needed: Omeprazole (PRILOSEC)  CycloSPORINE (RESTASIS)  Olopatadine Eye drops  As of today, STOP taking all Aspirin (unless instructed by your doctor) and Other Aspirin containing products, Vitamins, Fish oils, and Herbal medications. Also stop all NSAIDS i.e. Advil, Ibuprofen, Motrin, Aleve, Anaprox, Naproxen, BC, Goody Powders, and all Supplements.   No Smoking of any kind, Tobacco/Vaping, or Alcohol products 24 hours prior to your procedure. If you use a Cpap at night, you may bring all equipment for your overnight stay.   Day of Surgery:  Do not wear jewelry, make-up or nail polish.  Do not wear lotions, powders, or perfumes, or deodorant.  Do not shave 48 hours prior to surgery.    Do not bring valuables to the hospital.  [facility] is not responsible for any belongings or valuables.  Contacts, dentures or bridgework may not be worn into surgery.   For patients admitted to the hospital, discharge time will be determined by your treatment team.  Patients discharged the day of surgery will not be allowed to drive home, and someone age 17 and over needs to stay with them for 24 hours.   Special instructions:   [place]- Preparing For Surgery  Before surgery, you can play an important role. Because skin is not sterile, your skin  needs to be as free of germs as possible. You can reduce the number of germs on your skin by washing with CHG (chlorahexidine gluconate) Soap before surgery.  CHG is an antiseptic cleaner which kills germs and bonds with the skin to continue killing germs even after washing.    Oral Hygiene is also important to reduce your risk of infection.  Remember - BRUSH YOUR TEETH THE MORNING OF SURGERY WITH YOUR REGULAR TOOTHPASTE  Please do not use if you have an allergy to CHG or antibacterial soaps. If your skin becomes reddened/irritated stop using the CHG.  Do not shave (including legs and underarms) for at least 48 hours prior to first CHG shower. It is OK to shave your face.  Please follow these instructions carefully.   1. Shower the NIGHT BEFORE SURGERY and the MORNING OF SURGERY with CHG.   2. If you chose to wash your hair, wash your hair first as usual with your normal shampoo.  3. After you shampoo, rinse your hair and body thoroughly to remove the shampoo.  4. Use CHG as you would any other liquid soap. You can apply CHG directly to the skin and wash gently with a scrungie or a clean washcloth.   5. Apply the CHG Soap to your body ONLY FROM THE NECK DOWN.  Do not use on open wounds or open sores. Avoid contact with your eyes, ears, mouth and genitals (private parts). Wash Face and genitals (private parts)  with your normal soap.  6. Wash thoroughly,  paying special attention to the area where your surgery will be performed.  7. Thoroughly rinse your body with warm water from the neck down.  8. DO NOT shower/wash with your normal soap after using and rinsing off the CHG Soap.  9. Pat yourself dry with a CLEAN TOWEL.  10. Wear CLEAN PAJAMAS to bed the night before surgery, wear comfortable clothes the morning of surgery  11. Place CLEAN SHEETS on your bed the night of your first shower and DO NOT SLEEP WITH PETS.  Reminders: Do not apply any deodorants/lotions.  Please wear clean  clothes to the hospital/surgery center.   Remember to brush your teeth WITH YOUR REGULAR TOOTHPASTE.  Please read over the following fact sheets that you were given.

## 2020-07-03 ENCOUNTER — Encounter (HOSPITAL_COMMUNITY)
Admission: RE | Admit: 2020-07-03 | Discharge: 2020-07-03 | Disposition: A | Discharge status: Home / Self Care | Payer: Medicaid Other | Source: Ambulatory Visit | Attending: Obstetrics and Gynecology | Admitting: Obstetrics and Gynecology

## 2020-07-03 ENCOUNTER — Other Ambulatory Visit: Payer: Self-pay

## 2020-07-03 ENCOUNTER — Encounter (HOSPITAL_COMMUNITY): Payer: Self-pay

## 2020-07-03 DIAGNOSIS — Z01812 Encounter for preprocedural laboratory examination: Secondary | ICD-10-CM | POA: Diagnosis not present

## 2020-07-03 LAB — BASIC METABOLIC PANEL
Anion gap: 8 (ref 5–15)
BUN: 5 mg/dL — ABNORMAL LOW (ref 6–20)
CO2: 27 mmol/L (ref 22–32)
Calcium: 8.9 mg/dL (ref 8.9–10.3)
Chloride: 105 mmol/L (ref 98–111)
Creatinine, Ser: 0.6 mg/dL (ref 0.44–1.00)
GFR, Estimated: >60 mL/min (ref >60)
Glucose, Bld: 100 mg/dL — ABNORMAL HIGH (ref 70–99)
Potassium: 3.3 mmol/L — ABNORMAL LOW (ref 3.5–5.1)
Sodium: 140 mmol/L (ref 135–145)

## 2020-07-03 LAB — CBC
HCT: 37.8 % (ref 36.0–46.0)
Hemoglobin: 12.4 g/dL (ref 12.0–15.0)
MCH: 28.1 pg (ref 26.0–34.0)
MCHC: 32.8 g/dL (ref 30.0–36.0)
MCV: 85.5 fL (ref 80.0–100.0)
Platelets: 324 10*3/uL (ref 150–400)
RBC: 4.42 MIL/uL (ref 3.87–5.11)
RDW: 13.6 % (ref 11.5–15.5)
WBC: 9.8 10*3/uL (ref 4.0–10.5)
nRBC: 0 % (ref 0.0–0.2)

## 2020-07-03 LAB — NO BLOOD PRODUCTS

## 2020-07-03 NOTE — Progress Notes (Addendum)
PCP - Dr. Susy Frizzle  Cardiologist - Denies  Chest x-ray - 04/20/20 (E)  EKG - 04/20/20 (E)  Stress Test - Denies  ECHO - Denies  Cardiac Cath - Denies  AICD-na PM-na LOOP-na  Sleep Study - Yes- Positive CPAP - Denies  LABS- 04/13/20: COVID- Positive- No repeat covid test needed for 90 days. 07/03/20: CBC, BMP 07/09/20: POC Upreg  ASA- Denies  ERAS- Yes- no drink  HA1C- Denies  Pt refused all blood and blood products due to her religion of a Jehovah's Witness. Importance of receiving blood explained and Blood Facts information given, as well. All risks of refusing explained including death. Refusal form signed, witnessed and faxed to the [facility] blood bank. Blood bank also called to verify that a type and screen is not needed if the pt refuses products.Type and screen lab cancelled.  Anesthesia- Yes- previous consult  Pt denies having chest pain, sob, or fever at this time. All instructions explained to the pt, with a verbal understanding of the material. Pt agrees to go over the instructions while at home for a better understanding. Pt also instructed to self quarantine after being tested for COVID-19. The opportunity to ask questions was provided.   Coronavirus Screening  Have you experienced the following symptoms:  Cough yes/no: No Fever (>100.22F)  yes/no: No Runny nose yes/no: No Sore throat yes/no: No Difficulty breathing/shortness of breath  yes/no: No  Have you or a family member traveled in the last 14 days and where? yes/no: No   If the patient indicates "YES" to the above questions, their PAT will be rescheduled to limit the exposure to others and, the surgeon will be notified. THE PATIENT WILL NEED TO BE ASYMPTOMATIC FOR 14 DAYS.   If the patient is not experiencing any of these symptoms, the PAT nurse will instruct them to NOT bring anyone with them to their appointment since they may have these symptoms or traveled as well.   Please remind your patients  and families that hospital visitation restrictions are in effect and the importance of the restrictions.

## 2020-07-04 NOTE — Anesthesia Preprocedure Evaluation (Addendum)
Anesthesia Evaluation  Patient identified by MRN, date of birth, ID band Patient awake    Reviewed: Allergy & Precautions, NPO status , Patient's Chart, lab work & pertinent test results  History of Anesthesia Complications Negative for: history of anesthetic complications  Airway Mallampati: I  TM Distance: >3 FB Neck ROM: Full    Dental  (+) Dental Advisory Given   Pulmonary sleep apnea (no longer uses CPAP) , former smoker,  08/04/2020 SARS Coronavirus NEG   breath sounds clear to auscultation       Cardiovascular hypertension, Pt. on medications (-) angina Rhythm:Regular Rate:Normal     Neuro/Psych Anxiety Depression negative neurological ROS     GI/Hepatic Neg liver ROS, GERD  Medicated and Controlled,  Endo/Other  Morbid obesity  Renal/GU negative Renal ROS     Musculoskeletal   Abdominal (+) + obese,   Peds  Hematology  (+) JEHOVAH'S WITNESS (accepts albumin, declines all other blood products)  Anesthesia Other Findings   Reproductive/Obstetrics                           Anesthesia Physical Anesthesia Plan  ASA: III  Anesthesia Plan: General   Post-op Pain Management: GA combined w/ Regional for post-op pain   Induction: Intravenous  PONV Risk Score and Plan: Ondansetron, Dexamethasone and Scopolamine patch - Pre-op  Airway Management Planned: Oral ETT  Additional Equipment: None  Intra-op Plan:   Post-operative Plan: Extubation in OR  Informed Consent: I have reviewed the patients History and Physical, chart, labs and discussed the procedure including the risks, benefits and alternatives for the proposed anesthesia with the patient or authorized representative who has indicated his/her understanding and acceptance.     Dental advisory given  Plan Discussed with: CRNA and Surgeon  Anesthesia Plan Comments: (See recent PAT note by Karoline Caldwell, PA-C 05/15/20. Pt now  reports complete recovery from Covid, denies SOB or DOE. Preop labs reviewed, unremarkable.  Plan routine monitors, GETA with TAP blocks for post op analgesia)      Anesthesia Quick Evaluation

## 2020-07-09 DIAGNOSIS — D259 Leiomyoma of uterus, unspecified: Secondary | ICD-10-CM | POA: Diagnosis present

## 2020-08-03 ENCOUNTER — Encounter (HOSPITAL_COMMUNITY): Payer: Self-pay | Admitting: Obstetrics and Gynecology

## 2020-08-03 MED ORDER — DEXTROSE 5 % IV SOLN
3.0000 g | INTRAVENOUS | Status: AC
  Administered 2020-08-06: 3 g via INTRAVENOUS
  Filled 2020-08-03: qty 3

## 2020-08-03 NOTE — Progress Notes (Signed)
Spoke with pt for pre-op call. Pt tested positive for Covid on 04/13/20. She is past the 90 days so she will get a Covid test done tomorrow. Pt has hx of HTN. Denies cardiac history or Diabetes.

## 2020-08-04 ENCOUNTER — Other Ambulatory Visit (HOSPITAL_COMMUNITY)
Admission: RE | Admit: 2020-08-04 | Discharge: 2020-08-04 | Disposition: A | Discharge status: Home / Self Care | Payer: Medicaid Other | Source: Ambulatory Visit | Attending: Obstetrics and Gynecology | Admitting: Obstetrics and Gynecology

## 2020-08-04 DIAGNOSIS — Z20822 Contact with and (suspected) exposure to covid-19: Secondary | ICD-10-CM | POA: Insufficient documentation

## 2020-08-04 DIAGNOSIS — Z01812 Encounter for preprocedural laboratory examination: Secondary | ICD-10-CM | POA: Insufficient documentation

## 2020-08-04 LAB — SARS CORONAVIRUS 2 (TAT 6-24 HRS): SARS Coronavirus 2: NEGATIVE

## 2020-08-05 NOTE — H&P (Signed)
[name] is an 35 y.o. female G0P0 with large uterine fibroid for abdominal myomectomy.  Fibroid posterior,  intramural 8cm, with question of submucosal aspect  Denies hysterectomy and UFE as wants to preserve ability to have children/fertility.  Pt c/o menorrhagia and clots.  Has bled to anemia.  D/w pt r/b/a of abdominal myomectomy and process, postop expectations.  Pt is allergic to percocet, d/w pt pain management with vicodin/ultram at preop visit.  Surgery delayed due to hospital covid census.  Pt had blood loss anemia, received iron infusion.    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  Also dysmenorrhea   Menstrual History:  Patient's last menstrual period was 07/18/2020.    Past Medical History:  Diagnosis Date  . Abnormal Pap smear    colposcopy, mild dysplasia, HPV  . Acid reflux   . Anemia   . Anxiety    on meds- stable  . Bronchitis   . Chlamydia   . COVID-19   . Depression    on meds for bipolar  . Fibroid   . Gonorrhea   . History of PID   . Hypertension   . Obesity   . Panic attack   . Pneumonia   . Pregnancy induced hypertension   . Sleep apnea    hasnt uses CPAP x 4 years  . Trichomonas   . Urinary tract infection   . Uterine leiomyoma 05/01/2020    Past Surgical History:  Procedure Laterality Date  . ABDOMINAL SURGERY    . CESAREAN SECTION  05/02/03,05/04/08,10/23/10   x3  . CHOLECYSTECTOMY N/A 10/07/2012   Procedure: LAPAROSCOPIC CHOLECYSTECTOMY WITH INTRAOPERATIVE CHOLANGIOGRAM;  Surgeon: Imogene Burn. Georgette Dover, MD;  Location: [facility];  Service: General;  Laterality: N/A;  . ESOPHAGOGASTRODUODENOSCOPY (EGD) WITH PROPOFOL N/A 10/08/2018   Procedure: ESOPHAGOGASTRODUODENOSCOPY (EGD) WITH PROPOFOL;  Surgeon: Carol Ada, MD;  Location: [facility];  Service: Endoscopy;  Laterality: N/A;  . FOOT SURGERY    . HERNIA REPAIR    . INSERTION OF MESH N/A  12/02/2012   Procedure: INSERTION OF MESH;  Surgeon: Adin Hector, MD;  Location: [facility];  Service: General;  Laterality: N/A;  . TUBAL LIGATION    . UMBILICAL HERNIA REPAIR N/A 12/02/2012   Procedure: LAPAROSCOPIC UMBILICAL HERNIA;  Surgeon: Adin Hector, MD;  Location: [facility];  Service: General;  Laterality: N/A;  . WISDOM TOOTH EXTRACTION      Family History  Problem Relation Age of Onset  . Hypertension Mother   . Heart disease Father   . Stroke Father   . Other Neg Hx   ovarian cancer mgm  Social History:  reports that she has quit smoking. Her smoking use included cigarettes. She has a 0.25 pack-year smoking history. She has never used smokeless tobacco. She reports that she does not drink alcohol and does not use drugs.single  Allergies:  Allergies  Allergen Reactions  . Percocet [Oxycodone-Acetaminophen] Hives and Rash   Meds:amlodipine, clobetasol, clobex, Vitamin D and B12, olopatadine, restasis, omeprazole, potassium   Review of Systems  Constitutional: Negative.   HENT: Negative.   Respiratory: Negative.   Cardiovascular: Negative.   Gastrointestinal: Positive for abdominal pain.  Genitourinary: Negative.   Musculoskeletal: Negative.   Skin: Negative.   Neurological: Negative.   Psychiatric/Behavioral: Negative.     Last menstrual period 07/18/2020. Physical Exam Constitutional:      Appearance: Normal appearance. She is  obese.  HENT:     Head: Normocephalic and atraumatic.  Cardiovascular:     Rate and Rhythm: Normal rate and regular rhythm.  Pulmonary:     Effort: Pulmonary effort is normal.     Breath sounds: Normal breath sounds.  Abdominal:     General: Bowel sounds are normal.     Palpations: Abdomen is soft.  Musculoskeletal:        General: Normal range of motion.     Cervical back: Normal range of motion and neck supple.  Skin:    General: Skin is warm and dry.  Neurological:     Mental Status: She is alert and oriented to person,  place, and time.  Psychiatric:        Mood and Affect: Mood normal.        Behavior: Behavior normal.    US reveals 8cm posterior intramural/submucosal fibroid.    Assessment/Plan: 35yo G3P3 with large fibroid and heavy menses for uterine myomectomy D/w pt r/b/a of surgery Ancef for prophylaxis Closely monitor Hgb  Riah Kehoe Bovard-Stuckert 08/05/2020, 9:54 PM

## 2020-08-06 ENCOUNTER — Other Ambulatory Visit: Payer: Self-pay

## 2020-08-06 ENCOUNTER — Inpatient Hospital Stay (HOSPITAL_COMMUNITY)
Admission: RE | Admit: 2020-08-06 | Discharge: 2020-08-07 | DRG: 742 | Disposition: A | Discharge status: Home / Self Care | Payer: Medicaid Other | Attending: Obstetrics and Gynecology | Admitting: Obstetrics and Gynecology

## 2020-08-06 ENCOUNTER — Encounter (HOSPITAL_COMMUNITY)
Admission: RE | Disposition: A | Discharge status: Home / Self Care | Payer: Self-pay | Source: Home / Self Care | Attending: Obstetrics and Gynecology

## 2020-08-06 ENCOUNTER — Inpatient Hospital Stay (HOSPITAL_COMMUNITY): Payer: Medicaid Other | Admitting: Physician Assistant

## 2020-08-06 ENCOUNTER — Inpatient Hospital Stay (HOSPITAL_COMMUNITY): Payer: Medicaid Other | Admitting: Anesthesiology

## 2020-08-06 ENCOUNTER — Encounter (HOSPITAL_COMMUNITY): Payer: Self-pay | Admitting: Obstetrics and Gynecology

## 2020-08-06 DIAGNOSIS — Z6841 Body Mass Index (BMI) 40.0 and over, adult: Secondary | ICD-10-CM | POA: Diagnosis not present

## 2020-08-06 DIAGNOSIS — N920 Excessive and frequent menstruation with regular cycle: Secondary | ICD-10-CM | POA: Diagnosis present

## 2020-08-06 DIAGNOSIS — Z8616 Personal history of COVID-19: Secondary | ICD-10-CM | POA: Diagnosis not present

## 2020-08-06 DIAGNOSIS — D259 Leiomyoma of uterus, unspecified: Secondary | ICD-10-CM

## 2020-08-06 DIAGNOSIS — D251 Intramural leiomyoma of uterus: Principal | ICD-10-CM | POA: Diagnosis present

## 2020-08-06 DIAGNOSIS — N946 Dysmenorrhea, unspecified: Secondary | ICD-10-CM | POA: Diagnosis present

## 2020-08-06 DIAGNOSIS — Z9889 Other specified postprocedural states: Secondary | ICD-10-CM

## 2020-08-06 DIAGNOSIS — Z87891 Personal history of nicotine dependence: Secondary | ICD-10-CM | POA: Diagnosis not present

## 2020-08-06 DIAGNOSIS — D500 Iron deficiency anemia secondary to blood loss (chronic): Secondary | ICD-10-CM | POA: Diagnosis present

## 2020-08-06 DIAGNOSIS — D250 Submucous leiomyoma of uterus: Secondary | ICD-10-CM | POA: Diagnosis present

## 2020-08-06 DIAGNOSIS — Z885 Allergy status to narcotic agent status: Secondary | ICD-10-CM

## 2020-08-06 HISTORY — DX: Pneumonia, unspecified organism: J18.9

## 2020-08-06 HISTORY — PX: MYOMECTOMY: SHX85

## 2020-08-06 HISTORY — DX: Anemia, unspecified: D64.9

## 2020-08-06 HISTORY — DX: COVID-19: U07.1

## 2020-08-06 LAB — BASIC METABOLIC PANEL
Anion gap: 8 (ref 5–15)
BUN: 9 mg/dL (ref 6–20)
CO2: 25 mmol/L (ref 22–32)
Calcium: 9.2 mg/dL (ref 8.9–10.3)
Chloride: 104 mmol/L (ref 98–111)
Creatinine, Ser: 0.77 mg/dL (ref 0.44–1.00)
GFR, Estimated: >60 mL/min (ref >60)
Glucose, Bld: 104 mg/dL — ABNORMAL HIGH (ref 70–99)
Potassium: 3 mmol/L — ABNORMAL LOW (ref 3.5–5.1)
Sodium: 137 mmol/L (ref 135–145)

## 2020-08-06 LAB — CBC
HCT: 37.3 % (ref 36.0–46.0)
Hemoglobin: 11.9 g/dL — ABNORMAL LOW (ref 12.0–15.0)
MCH: 27 pg (ref 26.0–34.0)
MCHC: 31.9 g/dL (ref 30.0–36.0)
MCV: 84.8 fL (ref 80.0–100.0)
Platelets: 378 10*3/uL (ref 150–400)
RBC: 4.4 MIL/uL (ref 3.87–5.11)
RDW: 13.5 % (ref 11.5–15.5)
WBC: 12.1 10*3/uL — ABNORMAL HIGH (ref 4.0–10.5)
nRBC: 0 % (ref 0.0–0.2)

## 2020-08-06 LAB — NO BLOOD PRODUCTS

## 2020-08-06 SURGERY — MYOMECTOMY, ABDOMINAL APPROACH
Anesthesia: General | Site: Abdomen

## 2020-08-06 MED ORDER — FENTANYL CITRATE (PF) 250 MCG/5ML IJ SOLN
INTRAMUSCULAR | Status: DC | PRN
  Administered 2020-08-06 (×3): 50 ug via INTRAVENOUS
  Administered 2020-08-06: 25 ug via INTRAVENOUS
  Administered 2020-08-06: 100 ug via INTRAVENOUS
  Administered 2020-08-06: 50 ug via INTRAVENOUS
  Administered 2020-08-06: 25 ug via INTRAVENOUS

## 2020-08-06 MED ORDER — KETOROLAC TROMETHAMINE 15 MG/ML IJ SOLN
15.0000 mg | INTRAMUSCULAR | Status: DC
  Filled 2020-08-06: qty 1

## 2020-08-06 MED ORDER — SODIUM CHLORIDE 0.9% FLUSH: 9.0000 mL | INTRAVENOUS | Status: DC | PRN

## 2020-08-06 MED ORDER — DEXAMETHASONE SODIUM PHOSPHATE 10 MG/ML IJ SOLN
INTRAMUSCULAR | Status: AC
  Filled 2020-08-06: qty 1

## 2020-08-06 MED ORDER — NALOXONE HCL 0.4 MG/ML IJ SOLN: 0.4000 mg | INTRAMUSCULAR | Status: DC | PRN

## 2020-08-06 MED ORDER — CYCLOSPORINE 0.05 % OP EMUL
1.0000 [drp] | Freq: Two times a day (BID) | OPHTHALMIC | Status: DC | PRN
  Filled 2020-08-06: qty 1

## 2020-08-06 MED ORDER — ROCURONIUM BROMIDE 10 MG/ML (PF) SYRINGE
PREFILLED_SYRINGE | INTRAVENOUS | Status: AC
  Filled 2020-08-06: qty 10

## 2020-08-06 MED ORDER — PROPOFOL 500 MG/50ML IV EMUL
INTRAVENOUS | Status: DC | PRN
  Administered 2020-08-06: 40 ug/kg/min via INTRAVENOUS

## 2020-08-06 MED ORDER — MIDAZOLAM HCL 2 MG/2ML IJ SOLN
INTRAMUSCULAR | Status: AC
  Filled 2020-08-06: qty 2

## 2020-08-06 MED ORDER — 0.9 % SODIUM CHLORIDE (POUR BTL) OPTIME
TOPICAL | Status: DC | PRN
  Administered 2020-08-06: 1000 mL

## 2020-08-06 MED ORDER — ONDANSETRON HCL 4 MG/2ML IJ SOLN: 4.0000 mg | Freq: Four times a day (QID) | INTRAMUSCULAR | Status: DC | PRN

## 2020-08-06 MED ORDER — KETAMINE HCL 10 MG/ML IJ SOLN
INTRAMUSCULAR | Status: DC | PRN
  Administered 2020-08-06 (×2): 10 mg via INTRAVENOUS
  Administered 2020-08-06: 20 mg via INTRAVENOUS

## 2020-08-06 MED ORDER — GUAIFENESIN 100 MG/5ML PO SOLN
15.0000 mL | ORAL | Status: DC | PRN
Start: 2020-08-06 — End: 2020-08-07

## 2020-08-06 MED ORDER — PHENYLEPHRINE HCL-NACL 10-0.9 MG/250ML-% IV SOLN
INTRAVENOUS | Status: AC
  Filled 2020-08-06: qty 250

## 2020-08-06 MED ORDER — OXYCODONE HCL 5 MG/5ML PO SOLN: 5.0000 mg | Freq: Once | ORAL | Status: DC | PRN

## 2020-08-06 MED ORDER — ONDANSETRON HCL 4 MG/2ML IJ SOLN
INTRAMUSCULAR | Status: AC
  Filled 2020-08-06: qty 2

## 2020-08-06 MED ORDER — CHLORHEXIDINE GLUCONATE 0.12 % MT SOLN
15.0000 mL | Freq: Once | OROMUCOSAL | Status: AC
  Administered 2020-08-06: 15 mL via OROMUCOSAL
  Filled 2020-08-06: qty 15

## 2020-08-06 MED ORDER — LIDOCAINE 2% (20 MG/ML) 5 ML SYRINGE
INTRAMUSCULAR | Status: AC
  Filled 2020-08-06: qty 5

## 2020-08-06 MED ORDER — ACETAMINOPHEN 500 MG PO TABS
1000.0000 mg | ORAL_TABLET | ORAL | Status: AC
  Administered 2020-08-06: 1000 mg via ORAL
  Filled 2020-08-06: qty 2

## 2020-08-06 MED ORDER — MORPHINE SULFATE 1 MG/ML IV SOLN PCA: INTRAVENOUS | Status: DC

## 2020-08-06 MED ORDER — ENOXAPARIN SODIUM 80 MG/0.8ML ~~LOC~~ SOLN
65.0000 mg | SUBCUTANEOUS | Status: DC
  Administered 2020-08-07: 65 mg via SUBCUTANEOUS
  Filled 2020-08-06: qty 0.8

## 2020-08-06 MED ORDER — PANTOPRAZOLE SODIUM 40 MG PO TBEC
40.0000 mg | DELAYED_RELEASE_TABLET | Freq: Every day | ORAL | Status: DC
  Administered 2020-08-07: 40 mg via ORAL
  Filled 2020-08-06: qty 1

## 2020-08-06 MED ORDER — ALUM & MAG HYDROXIDE-SIMETH 200-200-20 MG/5ML PO SUSP: 30.0000 mL | ORAL | Status: DC | PRN

## 2020-08-06 MED ORDER — ROCURONIUM BROMIDE 100 MG/10ML IV SOLN
INTRAVENOUS | Status: DC | PRN
  Administered 2020-08-06: 20 mg via INTRAVENOUS
  Administered 2020-08-06: 70 mg via INTRAVENOUS

## 2020-08-06 MED ORDER — DIPHENHYDRAMINE HCL 12.5 MG/5ML PO ELIX: 12.5000 mg | ORAL_SOLUTION | Freq: Four times a day (QID) | ORAL | Status: DC | PRN

## 2020-08-06 MED ORDER — VASOPRESSIN 20 UNIT/ML IV SOLN
INTRAVENOUS | Status: AC
  Filled 2020-08-06: qty 1

## 2020-08-06 MED ORDER — FENTANYL CITRATE (PF) 250 MCG/5ML IJ SOLN
INTRAMUSCULAR | Status: AC
  Filled 2020-08-06: qty 5

## 2020-08-06 MED ORDER — HYDROCODONE-ACETAMINOPHEN 5-325 MG PO TABS
1.0000 | ORAL_TABLET | Freq: Four times a day (QID) | ORAL | Status: DC | PRN
Start: 2020-08-06 — End: 2020-08-07
  Administered 2020-08-06 – 2020-08-07 (×3): 2 via ORAL
  Filled 2020-08-06 (×3): qty 2

## 2020-08-06 MED ORDER — HYDROMORPHONE HCL 1 MG/ML IJ SOLN
0.2500 mg | INTRAMUSCULAR | Status: DC | PRN
  Administered 2020-08-06: 0.5 mg via INTRAVENOUS

## 2020-08-06 MED ORDER — PROPOFOL 1000 MG/100ML IV EMUL
INTRAVENOUS | Status: AC
  Filled 2020-08-06: qty 200

## 2020-08-06 MED ORDER — LIDOCAINE 2% (20 MG/ML) 5 ML SYRINGE
INTRAMUSCULAR | Status: DC | PRN
  Administered 2020-08-06: 30 mg via INTRAVENOUS

## 2020-08-06 MED ORDER — LACTATED RINGERS IV SOLN: INTRAVENOUS | Status: DC

## 2020-08-06 MED ORDER — AMLODIPINE BESYLATE 10 MG PO TABS
10.0000 mg | ORAL_TABLET | Freq: Every day | ORAL | Status: DC
  Administered 2020-08-07: 10 mg via ORAL
  Filled 2020-08-06: qty 1

## 2020-08-06 MED ORDER — OXYCODONE HCL 5 MG PO TABS: 5.0000 mg | ORAL_TABLET | Freq: Once | ORAL | Status: DC | PRN

## 2020-08-06 MED ORDER — B-12 1000 MCG SL SUBL: 1000.0000 ug | SUBLINGUAL_TABLET | Freq: Every day | SUBLINGUAL | Status: DC

## 2020-08-06 MED ORDER — PROMETHAZINE HCL 25 MG/ML IJ SOLN: 6.2500 mg | INTRAMUSCULAR | Status: DC | PRN

## 2020-08-06 MED ORDER — MIDAZOLAM HCL 2 MG/2ML IJ SOLN: 0.5000 mg | Freq: Once | INTRAMUSCULAR | Status: DC | PRN

## 2020-08-06 MED ORDER — ONDANSETRON HCL 4 MG/2ML IJ SOLN
INTRAMUSCULAR | Status: DC | PRN
  Administered 2020-08-06: 4 mg via INTRAVENOUS

## 2020-08-06 MED ORDER — KETAMINE HCL 50 MG/5ML IJ SOSY
PREFILLED_SYRINGE | INTRAMUSCULAR | Status: AC
  Filled 2020-08-06: qty 5

## 2020-08-06 MED ORDER — MEPERIDINE HCL 25 MG/ML IJ SOLN: 6.2500 mg | INTRAMUSCULAR | Status: DC | PRN

## 2020-08-06 MED ORDER — BUPIVACAINE-EPINEPHRINE (PF) 0.5% -1:200000 IJ SOLN
INTRAMUSCULAR | Status: DC | PRN
  Administered 2020-08-06 (×2): 20 mL

## 2020-08-06 MED ORDER — DIPHENHYDRAMINE HCL 50 MG/ML IJ SOLN
INTRAMUSCULAR | Status: AC
  Filled 2020-08-06: qty 1

## 2020-08-06 MED ORDER — DEXAMETHASONE SODIUM PHOSPHATE 10 MG/ML IJ SOLN
INTRAMUSCULAR | Status: DC | PRN
  Administered 2020-08-06: 10 mg via INTRAVENOUS

## 2020-08-06 MED ORDER — POTASSIUM CHLORIDE 10 MEQ/100ML IV SOLN
10.0000 meq | INTRAVENOUS | Status: AC
Start: 2020-08-06 — End: 2020-08-06
  Administered 2020-08-06 (×4): 10 meq via INTRAVENOUS
  Filled 2020-08-06 (×4): qty 100

## 2020-08-06 MED ORDER — VASOPRESSIN 20 UNIT/ML IV SOLN
INTRAVENOUS | Status: DC | PRN
  Administered 2020-08-06: 25 mL via INTRAMUSCULAR

## 2020-08-06 MED ORDER — DEXMEDETOMIDINE (PRECEDEX) IN NS 20 MCG/5ML (4 MCG/ML) IV SYRINGE
PREFILLED_SYRINGE | INTRAVENOUS | Status: AC
  Filled 2020-08-06: qty 5

## 2020-08-06 MED ORDER — SCOPOLAMINE 1 MG/3DAYS TD PT72
1.0000 | MEDICATED_PATCH | TRANSDERMAL | Status: DC
  Administered 2020-08-06: 1.5 mg via TRANSDERMAL
  Filled 2020-08-06: qty 1

## 2020-08-06 MED ORDER — ONDANSETRON HCL 4 MG PO TABS: 4.0000 mg | ORAL_TABLET | Freq: Four times a day (QID) | ORAL | Status: DC | PRN

## 2020-08-06 MED ORDER — PROPOFOL 500 MG/50ML IV EMUL
INTRAVENOUS | Status: AC
  Filled 2020-08-06: qty 50

## 2020-08-06 MED ORDER — HYDROMORPHONE HCL 1 MG/ML IJ SOLN
INTRAMUSCULAR | Status: AC
  Administered 2020-08-06: 0.5 mg via INTRAVENOUS
  Filled 2020-08-06: qty 1

## 2020-08-06 MED ORDER — IBUPROFEN 800 MG PO TABS: 800.0000 mg | ORAL_TABLET | Freq: Three times a day (TID) | ORAL | Status: DC | PRN

## 2020-08-06 MED ORDER — DIPHENHYDRAMINE HCL 50 MG/ML IJ SOLN: 12.5000 mg | Freq: Four times a day (QID) | INTRAMUSCULAR | Status: DC | PRN

## 2020-08-06 MED ORDER — SODIUM CHLORIDE (PF) 0.9 % IJ SOLN
INTRAMUSCULAR | Status: AC
  Filled 2020-08-06: qty 50

## 2020-08-06 MED ORDER — PROPOFOL 10 MG/ML IV BOLUS
INTRAVENOUS | Status: DC | PRN
  Administered 2020-08-06: 250 mg via INTRAVENOUS
  Administered 2020-08-06: 50 mg via INTRAVENOUS

## 2020-08-06 MED ORDER — DEXMEDETOMIDINE (PRECEDEX) IN NS 20 MCG/5ML (4 MCG/ML) IV SYRINGE
PREFILLED_SYRINGE | INTRAVENOUS | Status: DC | PRN
  Administered 2020-08-06: 12 ug via INTRAVENOUS
  Administered 2020-08-06: 8 ug via INTRAVENOUS

## 2020-08-06 MED ORDER — POVIDONE-IODINE 10 % EX SWAB
2.0000 "application " | Freq: Once | CUTANEOUS | Status: AC
  Administered 2020-08-06: 2 via TOPICAL

## 2020-08-06 MED ORDER — OLOPATADINE HCL 0.1 % OP SOLN
1.0000 [drp] | Freq: Two times a day (BID) | OPHTHALMIC | Status: DC | PRN
  Filled 2020-08-06: qty 5

## 2020-08-06 MED ORDER — MENTHOL 3 MG MT LOZG: 1.0000 | LOZENGE | OROMUCOSAL | Status: DC | PRN

## 2020-08-06 MED ORDER — SUGAMMADEX SODIUM 200 MG/2ML IV SOLN
INTRAVENOUS | Status: DC | PRN
  Administered 2020-08-06: 300 mg via INTRAVENOUS

## 2020-08-06 MED ORDER — CELECOXIB 200 MG PO CAPS
400.0000 mg | ORAL_CAPSULE | ORAL | Status: AC
  Administered 2020-08-06: 400 mg via ORAL
  Filled 2020-08-06: qty 2

## 2020-08-06 MED ORDER — HEMOSTATIC AGENTS (NO CHARGE) OPTIME
TOPICAL | Status: DC | PRN
  Administered 2020-08-06: 1

## 2020-08-06 MED ORDER — SIMETHICONE 80 MG PO CHEW: 80.0000 mg | CHEWABLE_TABLET | Freq: Four times a day (QID) | ORAL | Status: DC | PRN

## 2020-08-06 MED ORDER — ORAL CARE MOUTH RINSE: 15.0000 mL | Freq: Once | OROMUCOSAL | Status: AC

## 2020-08-06 MED ORDER — PROPOFOL 10 MG/ML IV BOLUS
INTRAVENOUS | Status: AC
  Filled 2020-08-06: qty 40

## 2020-08-06 MED ORDER — MIDAZOLAM HCL 5 MG/5ML IJ SOLN
INTRAMUSCULAR | Status: DC | PRN
  Administered 2020-08-06 (×2): 1 mg via INTRAVENOUS

## 2020-08-06 SURGICAL SUPPLY — 44 items
BARRIER ADHS 3X4 INTERCEED (GAUZE/BANDAGES/DRESSINGS) ×2 IMPLANT
BENZOIN TINCTURE PRP APPL 2/3 (GAUZE/BANDAGES/DRESSINGS) ×2 IMPLANT
CANISTER SUCT 3000ML PPV (MISCELLANEOUS) ×2 IMPLANT
DECANTER SPIKE VIAL GLASS SM (MISCELLANEOUS) ×2 IMPLANT
DRAPE CESAREAN BIRTH W POUCH (DRAPES) ×2 IMPLANT
DRSG OPSITE POSTOP 4X10 (GAUZE/BANDAGES/DRESSINGS) ×2 IMPLANT
DURAPREP 26ML APPLICATOR (WOUND CARE) ×2 IMPLANT
FILTER STRAW FLUID ASPIR (MISCELLANEOUS) IMPLANT
GAUZE SPONGE 4X4 16PLY XRAY LF (GAUZE/BANDAGES/DRESSINGS) ×2 IMPLANT
GLOVE BIO SURGEON STRL SZ 6 (GLOVE) ×2 IMPLANT
GLOVE BIO SURGEON STRL SZ 6.5 (GLOVE) ×2 IMPLANT
GLOVE SURG UNDER POLY LF SZ7 (GLOVE) ×2 IMPLANT
GOWN STRL REUS W/ TWL LRG LVL3 (GOWN DISPOSABLE) ×3 IMPLANT
GOWN STRL REUS W/TWL LRG LVL3 (GOWN DISPOSABLE) ×6
KIT DRSG PREVENA PLUS 7DAY 125 (MISCELLANEOUS) ×2 IMPLANT
KIT TURNOVER KIT B (KITS) ×2 IMPLANT
NEEDLE HYPO 22GX1.5 SAFETY (NEEDLE) ×2 IMPLANT
NS IRRIG 1000ML POUR BTL (IV SOLUTION) ×2 IMPLANT
PACK ABDOMINAL GYN (CUSTOM PROCEDURE TRAY) ×2 IMPLANT
PAD OB MATERNITY 4.3X12.25 (PERSONAL CARE ITEMS) ×2 IMPLANT
PENCIL SMOKE EVACUATOR (MISCELLANEOUS) ×2 IMPLANT
PREVENA INCISION MGT 90 150 (MISCELLANEOUS) ×2 IMPLANT
RETRACTOR TRAXI PANNICULUS (MISCELLANEOUS) ×1 IMPLANT
RTRCTR C-SECT PINK 25CM LRG (MISCELLANEOUS) ×2 IMPLANT
SPONGE LAP 18X18 RF (DISPOSABLE) ×4 IMPLANT
STRIP CLOSURE SKIN 1/2X4 (GAUZE/BANDAGES/DRESSINGS) ×2 IMPLANT
SUT PDS AB 0 CTX 60 (SUTURE) IMPLANT
SUT PLAIN 2 0 (SUTURE)
SUT PLAIN 2 0 XLH (SUTURE) ×2 IMPLANT
SUT PLAIN ABS 2-0 CT1 27XMFL (SUTURE) IMPLANT
SUT VIC AB 0 CT1 18XCR BRD8 (SUTURE) IMPLANT
SUT VIC AB 0 CT1 27 (SUTURE) ×4
SUT VIC AB 0 CT1 27XBRD ANBCTR (SUTURE) ×2 IMPLANT
SUT VIC AB 0 CT1 8-18 (SUTURE)
SUT VIC AB 2-0 CT1 27 (SUTURE) ×12
SUT VIC AB 2-0 CT1 TAPERPNT 27 (SUTURE) ×6 IMPLANT
SUT VIC AB 3-0 SH 27 (SUTURE) ×8
SUT VIC AB 3-0 SH 27X BRD (SUTURE) ×4 IMPLANT
SUT VIC AB 4-0 KS 27 (SUTURE) ×2 IMPLANT
SYR 3ML LL SCALE MARK (SYRINGE) IMPLANT
SYR CONTROL 10ML LL (SYRINGE) ×2 IMPLANT
TOWEL GREEN STERILE FF (TOWEL DISPOSABLE) ×4 IMPLANT
TRAXI PANNICULUS RETRACTOR (MISCELLANEOUS) ×1
TRAY FOLEY W/BAG SLVR 14FR (SET/KITS/TRAYS/PACK) ×2 IMPLANT

## 2020-08-06 NOTE — Brief Op Note (Signed)
08/06/2020  9:52 AM  PATIENT:  [name]  35 y.o. female  PRE-OPERATIVE DIAGNOSIS:  uterine leiomyoma  POST-OPERATIVE DIAGNOSIS:  uterine leiomyoma  PROCEDURE:  Procedure(s) with comments: ABDOMINAL MYOMECTOMY (N/A) - TAP BLOCK and general  SURGEON:  Surgeon(s) and Role:    * Bovard-Stuckert, Kijuana Ruppel, MD - Primary    * Banga, Cecilia Worema, DO - Assisting  ANESTHESIA:   general and TAP block  EBL:  150 mL uop 500cc clear urine at end of procedure, IVF per anesthesiology  BLOOD ADMINISTERED:none  DRAINS: none   LOCAL MEDICATIONS USED:  OTHER Vasopressin 25cc  SPECIMEN:  Source of Specimen:  uterine fibroid  DISPOSITION OF SPECIMEN:  PATHOLOGY  COUNTS:  YES  TOURNIQUET:  * No tourniquets in log *  DICTATION: .Other Dictation: Dictation Number [id number]  PLAN OF CARE: Admit to inpatient   PATIENT DISPOSITION:  PACU - hemodynamically stable.   Delay start of Pharmacological VTE agent (>24hrs) due to surgical blood loss or risk of bleeding: not applicable

## 2020-08-06 NOTE — Transfer of Care (Signed)
Immediate Anesthesia Transfer of Care Note  Patient: [name]  Procedure(s) Performed: ABDOMINAL MYOMECTOMY (N/A Abdomen)  Patient Location: PACU  Anesthesia Type:General and Regional  Level of Consciousness: alert , drowsy, patient cooperative and responds to stimulation  Airway & Oxygen Therapy: Patient Spontanous Breathing and Patient connected to face mask oxygen  Post-op Assessment: Report given to RN, Post -op Vital signs reviewed and stable and Patient moving all extremities X 4  Post vital signs: Reviewed and stable  Last Vitals:  Vitals Value Taken Time  BP 118/75 08/06/20 1004  Temp    Pulse 132 08/06/20 1006  Resp 21 08/06/20 1006  SpO2 100 % 08/06/20 1006  Vitals shown include unvalidated device data.  Last Pain:  Vitals:   08/06/20 0614  TempSrc:   PainSc: 0-No pain         Complications: No complications documented.

## 2020-08-06 NOTE — Anesthesia Procedure Notes (Signed)
Procedure Name: Intubation Date/Time: 08/06/2020 7:49 AM Performed by: Hendricks Limes, CRNA Pre-anesthesia Checklist: Patient identified, Emergency Drugs available, Suction available and Patient being monitored Patient Re-evaluated:Patient Re-evaluated prior to induction Oxygen Delivery Method: Circle system utilized Preoxygenation: Pre-oxygenation with 100% oxygen Induction Type: IV induction Ventilation: Mask ventilation without difficulty Laryngoscope Size: Miller and 2 Grade View: Grade I Tube type: Oral Tube size: 7.0 mm Number of attempts: 1 Airway Equipment and Method: Stylet (blanket ramp under shoulder blades) Placement Confirmation: ETT inserted through vocal cords under direct vision,  positive ETCO2 and breath sounds checked- equal and bilateral Secured at: 22 cm Tube secured with: Tape Dental Injury: Teeth and Oropharynx as per pre-operative assessment

## 2020-08-06 NOTE — Anesthesia Postprocedure Evaluation (Signed)
Anesthesia Post Note  Patient: [name]  Procedure(s) Performed: ABDOMINAL MYOMECTOMY (N/A Abdomen)     Patient location during evaluation: PACU Anesthesia Type: General and Regional Level of consciousness: awake and alert, patient cooperative and oriented Pain management: pain level controlled Vital Signs Assessment: post-procedure vital signs reviewed and stable Respiratory status: spontaneous breathing, nonlabored ventilation and respiratory function stable Cardiovascular status: blood pressure returned to baseline and stable Postop Assessment: no apparent nausea or vomiting Anesthetic complications: no   No complications documented.  Last Vitals:  Vitals:   08/06/20 1034 08/06/20 1100  BP: (!) 130/95 138/76  Pulse: 100 (!) 104  Resp: 12 16  Temp:  36.5 C  SpO2: 100% 100%    Last Pain:  Vitals:   08/06/20 1100  TempSrc: Oral  PainSc: Asleep                 Delrae Hagey,E. Yohan Samons

## 2020-08-06 NOTE — Anesthesia Procedure Notes (Signed)
Anesthesia Regional Block: TAP block   Pre-Anesthetic Checklist: ,, timeout performed, Correct Patient, Correct Site, Correct Laterality, Correct Procedure, Correct Position, site marked, Risks and benefits discussed,  Surgical consent,  Pre-op evaluation,  At surgeon's request and post-op pain management  Laterality: Left  Prep: chloraprep       Needles:  Injection technique: Single-shot  Needle Type: Echogenic Needle     Needle Length: 9cm  Needle Gauge: 21     Additional Needles:   Procedures:,,,, ultrasound used (permanent image in chart),,,,  Narrative:  Start time: 08/06/2020 7:18 AM End time: 08/06/2020 7:24 AM Injection made incrementally with aspirations every 5 mL.  Performed by: Personally  Anesthesiologist: Annye Asa, MD  Additional Notes: Pt identified in Holding room.  Monitors applied. Working IV access confirmed. Sterile prep L flank.  #21ga ECHOgenic arrow block needle into TAP with US guidance.  20cc 0.5% Bupivacaine with 1:200k epi injected incrementally after negative test dose.  Patient asymptomatic, VSS, no heme aspirated, tolerated well.  Jonathon Jordan, MD

## 2020-08-06 NOTE — Interval H&P Note (Signed)
History and Physical Interval Note:  08/06/2020 6:58 AM  [name]  has presented today for surgery, with the diagnosis of uterine leiomyoma.  The various methods of treatment have been discussed with the patient and family. After consideration of risks, benefits and other options for treatment, the patient has consented to  Procedure(s) with comments: ABDOMINAL MYOMECTOMY (N/A) - TAP BLOCK and general as a surgical intervention.  The patient's history has been reviewed, patient examined, no change in status, stable for surgery.  I have reviewed the patient's chart and labs.  Questions were answered to the patient's satisfaction.     Duyen Beckom Bovard-Stuckert

## 2020-08-06 NOTE — Anesthesia Procedure Notes (Signed)
   Anesthesia Regional Block: TAP block   Pre-Anesthetic Checklist: ,, timeout performed, Correct Patient, Correct Site, Correct Laterality, Correct Procedure, Correct Position, site marked, Risks and benefits discussed,  Surgical consent,  Pre-op evaluation,  At surgeon's request and post-op pain management  Laterality: Right  Prep: chloraprep       Needles:  Injection technique: Single-shot  Needle Type: Echogenic Needle     Needle Length: 9cm  Needle Gauge: 21     Additional Needles:   Procedures:,,,, ultrasound used (permanent image in chart),,,,  Narrative:  Start time: 08/06/2020 7:25 AM End time: 08/06/2020 7:30 AM Injection made incrementally with aspirations every 5 mL.  Performed by: Personally  Anesthesiologist: Annye Asa, MD  Additional Notes: Pt identified in Holding room.  Monitors applied. Working IV access confirmed. Sterile prep R flank.  #21ga ECHOgenic needle into TAP with US guidance.  20cc 0.5% Bupivacaine with 1:200k epi injected incrementally after negative test dose.  Patient asymptomatic, VSS, no heme aspirated, tolerated well.  Jenita Seashore, MD

## 2020-08-06 NOTE — Op Note (Signed)
NAME: [name], YOUNTS MEDICAL RECORD NO: [id number] ACCOUNT NO: [id number] DATE OF BIRTH: 1985-09-09 FACILITY: [facility] LOCATION: [facility] PHYSICIAN: Janyth Contes, MD  Operative Report   DATE OF PROCEDURE: 08/06/2020  PREOPERATIVE DIAGNOSIS:  Uterine leiomyoma, large, intramural and submucosal.  POSTOPERATIVE DIAGNOSIS:  Uterine leiomyoma, large, intramural and submucosal.  PROCEDURES:  Laparotomy, abdominal myomectomy.  SURGEON:  Janyth Contes, MD  ASSISTANT:  Carlynn Purl, DO  ANESTHESIA:  General anesthesia and TAP block.  ESTIMATED BLOOD LOSS:  Approximately 150 mL.  URINE OUTPUT:  500 mL clear urine at the end of the procedure.  IV FLUIDS:  Per Anesthesia.   COMPLICATIONS:  Surgery was complicated by the patient's morbid obesity.  PATHOLOGY:  Uterine leiomyoma measuring approximately 10 cm.  DESCRIPTION OF PROCEDURE:  After informed consent was reviewed with the patient including risks, benefits, and alternatives of the surgical procedure, she was transported to the operating room and placed on the table in supine position.  This was after a  TAP block had been placed.  She was prepped and draped in the normal sterile fashion.  A Foley catheter was sterilely placed.  A traxi retractor was also placed to control her panniculus.  A Pfannenstiel skin incision was made at the level of her  previous cesarean section.  The incision was carried through the underlying layer of fascia sharply.  The fascia was incised in the midline.  The midline was extended laterally with Mayo scissors.  The superior aspect of the fascial incision was grasped  with Kocher clamps, elevated, and the rectus muscles were dissected off both bluntly and sharply both superiorly and inferiorly.  Midline was easily identified, and the peritoneum was entered bluntly.  The incision was extended superiorly and inferiorly  with good visualization of the bladder.  An Alexis retractor was placed to aid  with visualization.  Her uterus was exteriorized, and an 8-10 cm polyp was noted posteriorly.  It appeared to be intramural, subserosal, and submucosal.  This was then  injected with vasopressin.  The serosa was incised over the fibroid, and the capsule was mobilized using hemostats and Bovie cautery.  The fibroid was delivered and sent to pathology.  It had noted to enter the endometrium.  The layers were closed on the  way out.  Several layers of 3-0 Vicryl were used to close the endometrium and myometrium.  An additional suture of 2-0 Vicryl was used to pull together the edges of the myometrium.  The serosa was reapproximated with 2-0 Vicryl in a baseball stitch.   This was noted to be hemostatic.  Interceed was applied after pelvic irrigation was performed.  The Alexis retractor was removed.  The peritoneum was closed with 2-0 Vicryl in a running fashion.  Subfascial planes were inspected and found to be  hemostatic.  The fascia was closed from either end overlapping in the midline with 0 Vicryl.  Subcutaneous adipose layer was made hemostatic with Bovie cautery and the dead space was closed with 3-0 plain gut.  The skin was closed with 3-0 Vicryl in a  subcuticular fashion.  Benzoin, Steri-Strips were applied.  A Prevena dressing was then applied to aid with healing.  The patient tolerated the procedure well.  Sponge, lap, and needle count was correct x2 per the operating room staff.  Dr Ivor Costa assistance was necessary to perform this case.     ROH D: 08/06/2020 12:42:19 pm T: 08/06/2020 4:50:00 pm  JOB: [id number]/ [id number]

## 2020-08-06 NOTE — Progress Notes (Signed)
Day of Surgery Procedure(s) (LRB): ABDOMINAL MYOMECTOMY (N/A)  Subjective: Patient reports incisional pain, tolerating PO.  No nausea/No emesis.  Pain controlled  Objective: I have reviewed patient's vital signs, intake and output, medications and labs.  General: alert and no distress Resp: clear to auscultation bilaterally Cardio: regular rate and rhythm GI: soft, non-tender; bowel sounds normal; no masses,  no organomegaly and incision: Prevena intact Extremities: extremities normal, atraumatic, no cyanosis or edema  Assessment: s/p Procedure(s) with comments: ABDOMINAL MYOMECTOMY (N/A) - TAP BLOCK and general: stable and progressing well  Plan: Advance diet Encourage ambulation Advance to PO medication Discontinue IV fluids  In AM  Routine postop care  LOS: 0 days    Jody Bovard-Stuckert 08/06/2020, 4:59 PM

## 2020-08-07 ENCOUNTER — Encounter (HOSPITAL_COMMUNITY): Payer: Self-pay | Admitting: Obstetrics and Gynecology

## 2020-08-07 LAB — BASIC METABOLIC PANEL
Anion gap: 9 (ref 5–15)
BUN: 7 mg/dL (ref 6–20)
CO2: 25 mmol/L (ref 22–32)
Calcium: 8.7 mg/dL — ABNORMAL LOW (ref 8.9–10.3)
Chloride: 102 mmol/L (ref 98–111)
Creatinine, Ser: 0.73 mg/dL (ref 0.44–1.00)
GFR, Estimated: >60 mL/min (ref >60)
Glucose, Bld: 123 mg/dL — ABNORMAL HIGH (ref 70–99)
Potassium: 3.4 mmol/L — ABNORMAL LOW (ref 3.5–5.1)
Sodium: 136 mmol/L (ref 135–145)

## 2020-08-07 LAB — CBC
HCT: 29.7 % — ABNORMAL LOW (ref 36.0–46.0)
Hemoglobin: 9.9 g/dL — ABNORMAL LOW (ref 12.0–15.0)
MCH: 27.9 pg (ref 26.0–34.0)
MCHC: 33.3 g/dL (ref 30.0–36.0)
MCV: 83.7 fL (ref 80.0–100.0)
Platelets: 332 10*3/uL (ref 150–400)
RBC: 3.55 MIL/uL — ABNORMAL LOW (ref 3.87–5.11)
RDW: 13.5 % (ref 11.5–15.5)
WBC: 15.7 10*3/uL — ABNORMAL HIGH (ref 4.0–10.5)
nRBC: 0 % (ref 0.0–0.2)

## 2020-08-07 LAB — SURGICAL PATHOLOGY

## 2020-08-07 MED ORDER — IBUPROFEN 800 MG PO TABS: 800.0000 mg | ORAL_TABLET | Freq: Three times a day (TID) | ORAL | 1 refills | Status: DC | PRN

## 2020-08-07 MED ORDER — HYDROCODONE-ACETAMINOPHEN 5-325 MG PO TABS: 1.0000 | ORAL_TABLET | Freq: Four times a day (QID) | ORAL | 0 refills | Status: DC | PRN

## 2020-08-07 NOTE — Progress Notes (Signed)
Pt given discharge instructions and all questions answered. IV was discontinued. Pt awaiting ride home.

## 2020-08-07 NOTE — Discharge Summary (Signed)
Physician Discharge Summary  Patient ID: [name] MRN: [id number] DOB/AGE: 1985/09/07 35 y.o.  Admit date: 08/06/2020 Discharge date: 08/07/2020  Admission Diagnoses:  Discharge Diagnoses:  Principal Problem:   Uterine leiomyoma Active Problems:   S/P myomectomy   Uterine fibroid   Discharged Condition: stable  Hospital Course: admitted for myomectomy, underwent without complication.  Discharged to home POD#1 - ambulating, voiding, tolerating po, pain controlled.  Labs stable  Consults: None  Significant Diagnostic Studies: labs: CBC and BMP  Treatments: analgesia: Vicodin and Motrin, also TAP block and surgery: abdominal myomectomy  Discharge Exam: Blood pressure 114/67, pulse 71, temperature 98.2 F (36.8 C), temperature source Oral, resp. rate 18, height 5\' 5"  (1.651 m), weight 131.5 kg, last menstrual period 07/18/2020, SpO2 97 %. General appearance: alert and no distress Resp: clear to auscultation bilaterally Cardio: regular rate and rhythm GI: soft, non-tender; bowel sounds normal; no masses,  no organomegaly Extremities: extremities normal, atraumatic, no cyanosis or edema Incision/Wound:prevena intact  Disposition: Discharge disposition: 01-Home or Self Care       Discharge Instructions    Call MD for:  persistant nausea and vomiting   Complete by: As directed    Call MD for:  redness, tenderness, or signs of infection (pain, swelling, redness, odor or green/yellow discharge around incision site)   Complete by: As directed    Call MD for:  severe uncontrolled pain   Complete by: As directed    Diet - low sodium heart healthy   Complete by: As directed    Diet - low sodium heart healthy   Complete by: As directed    Discharge instructions   Complete by: As directed    Call [phone] with questions or problems   Discharge instructions   Complete by: As directed    Call with questions or problems   Discharge wound care:   Complete by: As  directed    Return to office in 1 week to have prevena removed, call with questions [phone])   Discharge wound care:   Complete by: As directed    Return to office in one week to remove prevena   Driving Restrictions   Complete by: As directed    While taking strong pain medicine   Increase activity slowly   Complete by: As directed    Increase activity slowly   Complete by: As directed    Lifting restrictions   Complete by: As directed    No greater than 10-15lbs for 6 weeks   May shower / Bathe   Complete by: As directed    May walk up steps   Complete by: As directed    Sexual Activity Restrictions   Complete by: As directed    Pelvic rest - no douching, tampons or sex for 6 weeks     Allergies as of 08/07/2020      Reactions   Percocet [oxycodone-acetaminophen] Hives, Rash      Medication List    TAKE these medications   amLODipine 10 MG tablet Commonly known as: NORVASC Take 10 mg by mouth daily.   B-12 1000 MCG Subl Place 1,000 mcg under the tongue daily.   Clobetasol Propionate 0.05 % shampoo Apply 1 application topically daily.   cycloSPORINE 0.05 % ophthalmic emulsion Commonly known as: RESTASIS Place 1 drop into both eyes 2 (two) times daily as needed (dry eyes).   ergocalciferol 1.25 MG (50000 UT) capsule Commonly known as: VITAMIN D2 Take 50,000 Units by mouth every Thursday.  HYDROcodone-acetaminophen 5-325 MG tablet Commonly known as: NORCO/VICODIN Take 1-2 tablets by mouth every 6 (six) hours as needed for moderate pain.   ibuprofen 800 MG tablet Commonly known as: ADVIL Take 1 tablet (800 mg total) by mouth every 8 (eight) hours as needed for moderate pain (mild pain). What changed:   medication strength  how much to take  when to take this  reasons to take this   Olopatadine HCl 0.2 % Soln Place 1 drop into both eyes daily as needed (irritation).   omeprazole 20 MG capsule Commonly known as: PRILOSEC Take 20 mg by mouth  daily as needed (acid reflux).            Discharge Care Instructions  (From admission, onward)         Start     Ordered   08/07/20 0000  Discharge wound care:       Comments: Return to office in 1 week to have prevena removed, call with questions [phone])   08/07/20 0721   08/07/20 0000  Discharge wound care:       Comments: Return to office in one week to remove prevena   08/07/20 0728          Follow-up Information    Bovard-Stuckert, Roemello Speyer, MD. Schedule an appointment as soon as possible for a visit in 1 week(s).   Specialty: Obstetrics and Gynecology Why: to remove prevena, 2 weeks for incision check, 6 weeks for post-op check Contact information: [address] [place] [address] [phone]               Signed: Janyth Contes 08/07/2020, 7:29 AM

## 2020-08-07 NOTE — Progress Notes (Signed)
1 Day Post-Op Procedure(s) (LRB): ABDOMINAL MYOMECTOMY (N/A)  Subjective: Patient reports incisional pain and tolerating PO.  Catheter d/c'd this AM, has voided.  ambulating  Pain well controlled w TAP block and vicodin  Objective: I have reviewed patient's vital signs, intake and output, medications and labs.  General: alert and no distress Resp: clear to auscultation bilaterally Cardio: regular rate and rhythm GI: soft, non-tender; bowel sounds normal; no masses,  no organomegaly and incision: prevena intact Extremities: extremities normal, atraumatic, no cyanosis or edema  Assessment: s/p Procedure(s) with comments: ABDOMINAL MYOMECTOMY (N/A) - TAP BLOCK and general: stable and progressing well  Plan: Advance diet Encourage ambulation monitor voiding  If meeting guidelines - ambulating, voiding, tolerating po, pain controlled - d/c home today  LOS: 1 day    Jody Bovard-Stuckert 08/07/2020, 7:06 AM

## 2020-09-26 ENCOUNTER — Other Ambulatory Visit: Payer: Self-pay | Admitting: Otolaryngology

## 2020-09-26 ENCOUNTER — Other Ambulatory Visit (HOSPITAL_COMMUNITY): Payer: Self-pay | Admitting: Otolaryngology

## 2020-09-26 DIAGNOSIS — J387 Other diseases of larynx: Secondary | ICD-10-CM

## 2020-10-03 ENCOUNTER — Encounter (HOSPITAL_COMMUNITY): Payer: Self-pay

## 2020-10-03 ENCOUNTER — Ambulatory Visit (HOSPITAL_COMMUNITY)
Admission: RE | Admit: 2020-10-03 | Discharge: 2020-10-03 | Disposition: A | Discharge status: Home / Self Care | Payer: Medicaid Other | Source: Ambulatory Visit | Attending: Otolaryngology | Admitting: Otolaryngology

## 2020-10-03 ENCOUNTER — Other Ambulatory Visit: Payer: Self-pay

## 2020-10-03 DIAGNOSIS — J387 Other diseases of larynx: Secondary | ICD-10-CM | POA: Diagnosis not present

## 2020-10-03 LAB — POCT I-STAT CREATININE: Creatinine, Ser: 0.6 mg/dL (ref 0.44–1.00)

## 2020-10-03 MED ORDER — IOHEXOL 300 MG/ML  SOLN
75.0000 mL | Freq: Once | INTRAMUSCULAR | Status: AC | PRN
  Administered 2020-10-03: 75 mL via INTRAVENOUS

## 2020-12-11 ENCOUNTER — Ambulatory Visit: Payer: Medicaid Other | Admitting: Hematology

## 2020-12-11 ENCOUNTER — Other Ambulatory Visit: Payer: Medicaid Other

## 2020-12-14 ENCOUNTER — Telehealth: Payer: Self-pay | Admitting: Hematology

## 2020-12-14 ENCOUNTER — Other Ambulatory Visit: Payer: Medicaid Other

## 2020-12-14 ENCOUNTER — Ambulatory Visit: Payer: Medicaid Other | Admitting: Hematology

## 2020-12-14 NOTE — Telephone Encounter (Signed)
R/s appts per 7/8 sch msg. Pt aware.  

## 2021-01-09 NOTE — Progress Notes (Signed)
HEMATOLOGY/ONCOLOGY CONSULTATION NOTE  Date of Service: 01/09/2021  Patient Care Team: [name], PA as PCP - General (Physician Assistant)  CHIEF COMPLAINTS/PURPOSE OF CONSULTATION:  IDA  HISTORY OF PRESENTING ILLNESS:  [name] is a wonderful 35 y.o. female who has been referred to [place] by Dr. Melba Coon for evaluation and management of iron deficiency anemia. The pt reports that she is doing well overall.   The pt reports that the first time that she was told that she was anemic was a few months ago. Her menstrual cycles typically last six days, with four of those being especially heavy. Pt was recently told that she has a fibroid and has a consultation for surgical resection. Pt was given PO Iron to take, but has not, as she has anxiety surrounding taking medications. Pt has been experiencing fatigue, lightheadedness, dizziness, ice cravings, and restless legs.   She has three children, ages ranging from 3 to 89. Pt has had a tubal ligation. Pt is controlling her HTN with medication and has recently restarted using her CPAP machine. She endorses improved energy levels when using her CPAP regularly. Pt was raised as a Jehovah's Witness and is very hesitant to receive blood products.   Pt had hives after taking Percocet. This has made her cautious about taking medications.  Most recent lab results (01/17/2020) of CBC is as follows: all values are WNL except for RBC at 3.75, Hgb at 8.3, HCT at 28.1, MCV at 75, MCH at 22.1, MCH at 29.5, RDW at 16.8.  On review of systems, pt reports dizziness, lightheadedness, ice cravings, restless legs, anxiety and denies abdominal pain, leg swelling and any other symptoms.   On PMHx the pt reports HTN, Sleep Apnea, C-section x3, Tubal Ligation, Anxiety. On Social Hx the pt reports that she is a non-smoker and does not drink much alcohol.   INTERVAL HISTORY:  [name] is a wonderful 35 y.o. female who is here for evaluation and  management of iron deficiency anemia. The patient's last visit with [place] was on 06/11/2020.   The pt reports that she had a Myomectomy 08/06/2020 Adenomyosis - continued to have menorrhagia - Mirena IUD placed. Taking B12 no po iron Lab results today 01/10/2021 of CBC w/diff - hgb improved to 11.7 and CMP K 3.1 Ferritin 16 Iron saturation 10% B12 -- 373  On review of systems, pt reports still having heavier periods.  MEDICAL HISTORY:  Past Medical History:  Diagnosis Date   Abnormal Pap smear    colposcopy, mild dysplasia, HPV   Acid reflux    Anemia    Anxiety    on meds- stable   Bronchitis    Chlamydia    COVID-19    Depression    on meds for bipolar   Fibroid    Gonorrhea    History of PID    Hypertension    Obesity    Panic attack    Pneumonia    Pregnancy induced hypertension    Sleep apnea    hasnt uses CPAP x 4 years   Trichomonas    Urinary tract infection    Uterine leiomyoma 05/01/2020    SURGICAL HISTORY: Past Surgical History:  Procedure Laterality Date   ABDOMINAL SURGERY     CESAREAN SECTION  05/02/03,05/04/08,10/23/10   x3   CHOLECYSTECTOMY N/A 10/07/2012   Procedure: LAPAROSCOPIC CHOLECYSTECTOMY WITH INTRAOPERATIVE CHOLANGIOGRAM;  Surgeon: Imogene Burn. Georgette Dover, MD;  Location: [facility];  Service: General;  Laterality: N/A;  ESOPHAGOGASTRODUODENOSCOPY (EGD) WITH PROPOFOL N/A 10/08/2018   Procedure: ESOPHAGOGASTRODUODENOSCOPY (EGD) WITH PROPOFOL;  Surgeon: Carol Ada, MD;  Location: [facility];  Service: Endoscopy;  Laterality: N/A;   FOOT SURGERY     HERNIA REPAIR     INSERTION OF MESH N/A 12/02/2012   Procedure: INSERTION OF MESH;  Surgeon: Adin Hector, MD;  Location: [facility];  Service: General;  Laterality: N/A;   MYOMECTOMY N/A 08/06/2020   Procedure: ABDOMINAL MYOMECTOMY;  Surgeon: Janyth Contes, MD;  Location: [place];  Service: Gynecology;  Laterality: N/A;  TAP BLOCK and general   TUBAL LIGATION     UMBILICAL HERNIA REPAIR N/A 12/02/2012    Procedure: LAPAROSCOPIC UMBILICAL HERNIA;  Surgeon: Adin Hector, MD;  Location: [facility];  Service: General;  Laterality: N/A;   WISDOM TOOTH EXTRACTION      SOCIAL HISTORY: Social History   Socioeconomic History   Marital status: Single    Spouse name: Not on file   Number of children: Not on file   Years of education: Not on file   Highest education level: Not on file  Occupational History   Not on file  Tobacco Use   Smoking status: Former    Packs/day: 0.25    Years: 1.00    Pack years: 0.25    Types: Cigarettes   Smokeless tobacco: Never  Vaping Use   Vaping Use: Never used  Substance and Sexual Activity   Alcohol use: No    Comment: socially   Drug use: No   Sexual activity: Yes    Birth control/protection: Surgical    Comment: Last intercourse 2 nights ago.  Other Topics Concern   Not on file  Social History Narrative   Not on file   Social Determinants of Health   Financial Resource Strain: Not on file  Food Insecurity: Not on file  Transportation Needs: Not on file  Physical Activity: Not on file  Stress: Not on file  Social Connections: Not on file  Intimate Partner Violence: Not on file    FAMILY HISTORY: Family History  Problem Relation Age of Onset   Hypertension Mother    Heart disease Father    Stroke Father    Other Neg Hx     ALLERGIES:  is allergic to percocet [oxycodone-acetaminophen].  MEDICATIONS:  Current Outpatient Medications  Medication Sig Dispense Refill   amLODipine (NORVASC) 10 MG tablet Take 10 mg by mouth daily.     Clobetasol Propionate 0.05 % shampoo Apply 1 application topically daily.     Cyanocobalamin (B-12) 1000 MCG SUBL Place 1,000 mcg under the tongue daily. 30 tablet 5   cycloSPORINE (RESTASIS) 0.05 % ophthalmic emulsion Place 1 drop into both eyes 2 (two) times daily as needed (dry eyes).     ergocalciferol (VITAMIN D2) 1.25 MG (50000 UT) capsule Take 50,000 Units by mouth every Thursday.      HYDROcodone-acetaminophen (NORCO/VICODIN) 5-325 MG tablet Take 1-2 tablets by mouth every 6 (six) hours as needed for moderate pain. 30 tablet 0   ibuprofen (ADVIL) 800 MG tablet Take 1 tablet (800 mg total) by mouth every 8 (eight) hours as needed for moderate pain (mild pain). 45 tablet 1   Olopatadine HCl 0.2 % SOLN Place 1 drop into both eyes daily as needed (irritation).     omeprazole (PRILOSEC) 20 MG capsule Take 20 mg by mouth daily as needed (acid reflux).      No current facility-administered medications for this visit.    REVIEW  OF SYSTEMS:   10 Point review of Systems was done is negative except as noted above.  PHYSICAL EXAMINATION: ECOG PERFORMANCE STATUS: 1 - Symptomatic but completely ambulatory  . Vitals:   01/10/21 1036  BP: (!) 144/84  Pulse: 81  Resp: 19  Temp: 98.3 F (36.8 C)  SpO2: 100%    Filed Weights   01/10/21 1036  Weight: (!) 312 lb 9.6 oz (141.8 kg)    .Body mass index is 52.02 kg/m.   NAD GENERAL:alert, in no acute distress and comfortable SKIN: no acute rashes, no significant lesions EYES: conjunctiva are pink and non-injected, sclera anicteric OROPHARYNX: MMM, no exudates, no oropharyngeal erythema or ulceration NECK: supple, no JVD LYMPH:  no palpable lymphadenopathy in the cervical, axillary or inguinal regions LUNGS: clear to auscultation b/l with normal respiratory effort HEART: regular rate & rhythm ABDOMEN:  normoactive bowel sounds , non tender, not distended. Extremity: no pedal edema PSYCH: alert & oriented x 3 with fluent speech NEURO: no focal motor/sensory deficits  LABORATORY DATA:  I have reviewed the data as listed  . CBC Latest Ref Rng & Units 01/10/2021 08/07/2020 08/06/2020  WBC 4.0 - 10.5 K/uL 12.9(H) 15.7(H) 12.1(H)  Hemoglobin 12.0 - 15.0 g/dL 11.7(L) 9.9(L) 11.9(L)  Hematocrit 36.0 - 46.0 % 35.3(L) 29.7(L) 37.3  Platelets 150 - 400 K/uL 387 332 378   . CBC    Component Value Date/Time   WBC 12.9 (H)  01/10/2021 1009   RBC 4.44 01/10/2021 1009   HGB 11.7 (L) 01/10/2021 1009   HCT 35.3 (L) 01/10/2021 1009   PLT 387 01/10/2021 1009   MCV 79.5 (L) 01/10/2021 1009   MCH 26.4 01/10/2021 1009   MCHC 33.1 01/10/2021 1009   RDW 15.6 (H) 01/10/2021 1009   LYMPHSABS 4.0 01/10/2021 1009   MONOABS 0.6 01/10/2021 1009   EOSABS 0.1 01/10/2021 1009   BASOSABS 0.0 01/10/2021 1009    . CMP Latest Ref Rng & Units 01/10/2021 10/03/2020 08/07/2020  Glucose 70 - 99 mg/dL 100(H) - 123(H)  BUN 6 - 20 mg/dL 9 - 7  Creatinine 0.44 - 1.00 mg/dL 0.70 0.60 0.73  Sodium 135 - 145 mmol/L 140 - 136  Potassium 3.5 - 5.1 mmol/L 3.1(L) - 3.4(L)  Chloride 98 - 111 mmol/L 105 - 102  CO2 22 - 32 mmol/L 26 - 25  Calcium 8.9 - 10.3 mg/dL 9.1 - 8.7(L)  Total Protein 6.5 - 8.1 g/dL 7.1 - -  Total Bilirubin 0.3 - 1.2 mg/dL 0.3 - -  Alkaline Phos 38 - 126 U/L 68 - -  AST 15 - 41 U/L 13(L) - -  ALT 0 - 44 U/L 10 - -   . Lab Results  Component Value Date   IRON 34 (L) 01/10/2021   TIBC 341 01/10/2021   IRONPCTSAT 10 (L) 01/10/2021   (Iron and TIBC)  Lab Results  Component Value Date   FERRITIN 16 01/10/2021    Lab Results  Component Value Date   IRON 55 06/11/2020   TIBC 276 06/11/2020   IRONPCTSAT 20 (L) 06/11/2020   (Iron and TIBC)  Lab Results  Component Value Date   FERRITIN 55 06/11/2020     RADIOGRAPHIC STUDIES: I have personally reviewed the radiological images as listed and agreed with the findings in the report. No results found.  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  All of the patient's questions were answered with apparent satisfaction. The patient knows to call the clinic with any problems, questions or concerns.    Sullivan Lone MD [place] AAHIVMS [facility] Hematology/Oncology Physician [facility]  (Office):       [phone] (Work cell):  [phone] (Fax):           [phone]  01/09/2021 7:08 PM  I, Reinaldo Raddle, am acting as scribe for Dr. Sullivan Lone, MD.     .I have reviewed the above documentation for accuracy and completeness, and I agree with the above. Brunetta Genera MD

## 2021-01-10 ENCOUNTER — Inpatient Hospital Stay: Payer: Medicaid Other

## 2021-01-10 ENCOUNTER — Inpatient Hospital Stay: Payer: Medicaid Other | Attending: Hematology | Admitting: Hematology

## 2021-01-10 ENCOUNTER — Other Ambulatory Visit: Payer: Self-pay

## 2021-01-10 VITALS — BP 144/84 | HR 81 | Temp 98.3°F | Resp 19 | Ht 65.0 in | Wt 312.6 lb

## 2021-01-10 DIAGNOSIS — Z79899 Other long term (current) drug therapy: Secondary | ICD-10-CM | POA: Diagnosis not present

## 2021-01-10 DIAGNOSIS — E538 Deficiency of other specified B group vitamins: Secondary | ICD-10-CM | POA: Insufficient documentation

## 2021-01-10 DIAGNOSIS — E669 Obesity, unspecified: Secondary | ICD-10-CM | POA: Insufficient documentation

## 2021-01-10 DIAGNOSIS — F5089 Other specified eating disorder: Secondary | ICD-10-CM | POA: Diagnosis not present

## 2021-01-10 DIAGNOSIS — N920 Excessive and frequent menstruation with regular cycle: Secondary | ICD-10-CM | POA: Insufficient documentation

## 2021-01-10 DIAGNOSIS — N800 Endometriosis of uterus: Secondary | ICD-10-CM | POA: Diagnosis not present

## 2021-01-10 DIAGNOSIS — K219 Gastro-esophageal reflux disease without esophagitis: Secondary | ICD-10-CM | POA: Diagnosis not present

## 2021-01-10 DIAGNOSIS — Z8616 Personal history of COVID-19: Secondary | ICD-10-CM | POA: Diagnosis not present

## 2021-01-10 DIAGNOSIS — D509 Iron deficiency anemia, unspecified: Secondary | ICD-10-CM

## 2021-01-10 DIAGNOSIS — F419 Anxiety disorder, unspecified: Secondary | ICD-10-CM | POA: Insufficient documentation

## 2021-01-10 DIAGNOSIS — I10 Essential (primary) hypertension: Secondary | ICD-10-CM | POA: Insufficient documentation

## 2021-01-10 DIAGNOSIS — G4730 Sleep apnea, unspecified: Secondary | ICD-10-CM | POA: Diagnosis not present

## 2021-01-10 LAB — IRON AND TIBC
Iron: 34 ug/dL — ABNORMAL LOW (ref 41–142)
Saturation Ratios: 10 % — ABNORMAL LOW (ref 21–57)
TIBC: 341 ug/dL (ref 236–444)
UIBC: 307 ug/dL (ref 120–384)

## 2021-01-10 LAB — CMP (CANCER CENTER ONLY)
ALT: 10 U/L (ref 0–44)
AST: 13 U/L — ABNORMAL LOW (ref 15–41)
Albumin: 3.5 g/dL (ref 3.5–5.0)
Alkaline Phosphatase: 68 U/L (ref 38–126)
Anion gap: 9 (ref 5–15)
BUN: 9 mg/dL (ref 6–20)
CO2: 26 mmol/L (ref 22–32)
Calcium: 9.1 mg/dL (ref 8.9–10.3)
Chloride: 105 mmol/L (ref 98–111)
Creatinine: 0.7 mg/dL (ref 0.44–1.00)
GFR, Estimated: >60 mL/min (ref >60)
Glucose, Bld: 100 mg/dL — ABNORMAL HIGH (ref 70–99)
Potassium: 3.1 mmol/L — ABNORMAL LOW (ref 3.5–5.1)
Sodium: 140 mmol/L (ref 135–145)
Total Bilirubin: 0.3 mg/dL (ref 0.3–1.2)
Total Protein: 7.1 g/dL (ref 6.5–8.1)

## 2021-01-10 LAB — CBC WITH DIFFERENTIAL/PLATELET
Abs Immature Granulocytes: 0.04 10*3/uL (ref 0.00–0.07)
Basophils Absolute: 0 10*3/uL (ref 0.0–0.1)
Basophils Relative: 0 %
Eosinophils Absolute: 0.1 10*3/uL (ref 0.0–0.5)
Eosinophils Relative: 1 %
HCT: 35.3 % — ABNORMAL LOW (ref 36.0–46.0)
Hemoglobin: 11.7 g/dL — ABNORMAL LOW (ref 12.0–15.0)
Immature Granulocytes: 0 %
Lymphocytes Relative: 31 %
Lymphs Abs: 4 10*3/uL (ref 0.7–4.0)
MCH: 26.4 pg (ref 26.0–34.0)
MCHC: 33.1 g/dL (ref 30.0–36.0)
MCV: 79.5 fL — ABNORMAL LOW (ref 80.0–100.0)
Monocytes Absolute: 0.6 10*3/uL (ref 0.1–1.0)
Monocytes Relative: 5 %
Neutro Abs: 8.1 10*3/uL — ABNORMAL HIGH (ref 1.7–7.7)
Neutrophils Relative %: 63 %
Platelets: 387 10*3/uL (ref 150–400)
RBC: 4.44 MIL/uL (ref 3.87–5.11)
RDW: 15.6 % — ABNORMAL HIGH (ref 11.5–15.5)
WBC: 12.9 10*3/uL — ABNORMAL HIGH (ref 4.0–10.5)
nRBC: 0 % (ref 0.0–0.2)

## 2021-01-10 LAB — FERRITIN: Ferritin: 16 ng/mL (ref 11–307)

## 2021-01-10 LAB — VITAMIN B12: Vitamin B-12: 373 pg/mL (ref 180–914)

## 2021-01-20 ENCOUNTER — Encounter: Payer: Self-pay | Admitting: Hematology

## 2021-01-22 NOTE — Progress Notes (Signed)
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  Ferritin low - would recommend 1 additional dose of Injectafer -- plz schedule if patient agreeable.   Requested pt return call.

## 2021-01-23 ENCOUNTER — Telehealth: Payer: Self-pay

## 2021-01-23 ENCOUNTER — Telehealth: Payer: Self-pay | Admitting: Hematology

## 2021-01-23 NOTE — Telephone Encounter (Signed)
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  Ferritin low - would recommend 1 additional dose of Injectafer -- plz schedule if patient agreeable.  Reviewed foods rich in potassium with pt. Pt stated she would let her PCP know her K level. Pt agreeable to IV Injectafer, will send scheduling a message to get scheduled.  Pt acknowledged above information and verbalized understanding of discussion.

## 2021-01-23 NOTE — Telephone Encounter (Signed)
Scheduled appointment per 08/17 sch msg. Patient is aware.

## 2021-02-05 ENCOUNTER — Inpatient Hospital Stay: Payer: Medicaid Other

## 2021-02-05 ENCOUNTER — Telehealth: Payer: Self-pay

## 2021-02-05 ENCOUNTER — Telehealth: Payer: Self-pay | Admitting: Hematology

## 2021-02-05 NOTE — Telephone Encounter (Signed)
Scheduled appointment per 08/30 sch msg. Left message. 

## 2021-02-21 ENCOUNTER — Other Ambulatory Visit: Payer: Self-pay

## 2021-02-21 ENCOUNTER — Inpatient Hospital Stay: Payer: Medicaid Other | Attending: Hematology

## 2021-02-21 DIAGNOSIS — D509 Iron deficiency anemia, unspecified: Secondary | ICD-10-CM | POA: Insufficient documentation

## 2021-02-21 DIAGNOSIS — Z79899 Other long term (current) drug therapy: Secondary | ICD-10-CM | POA: Insufficient documentation

## 2021-02-27 IMAGING — DX DG CHEST 1V PORT
1 series · 1 of 1 positions shown · non-contrast
Comparison: 11/07/2013 chest radiograph and prior.

CLINICAL DATA: Shortness of breath, headache and fever.

EXAM:
PORTABLE CHEST 1 VIEW

[chest ap]
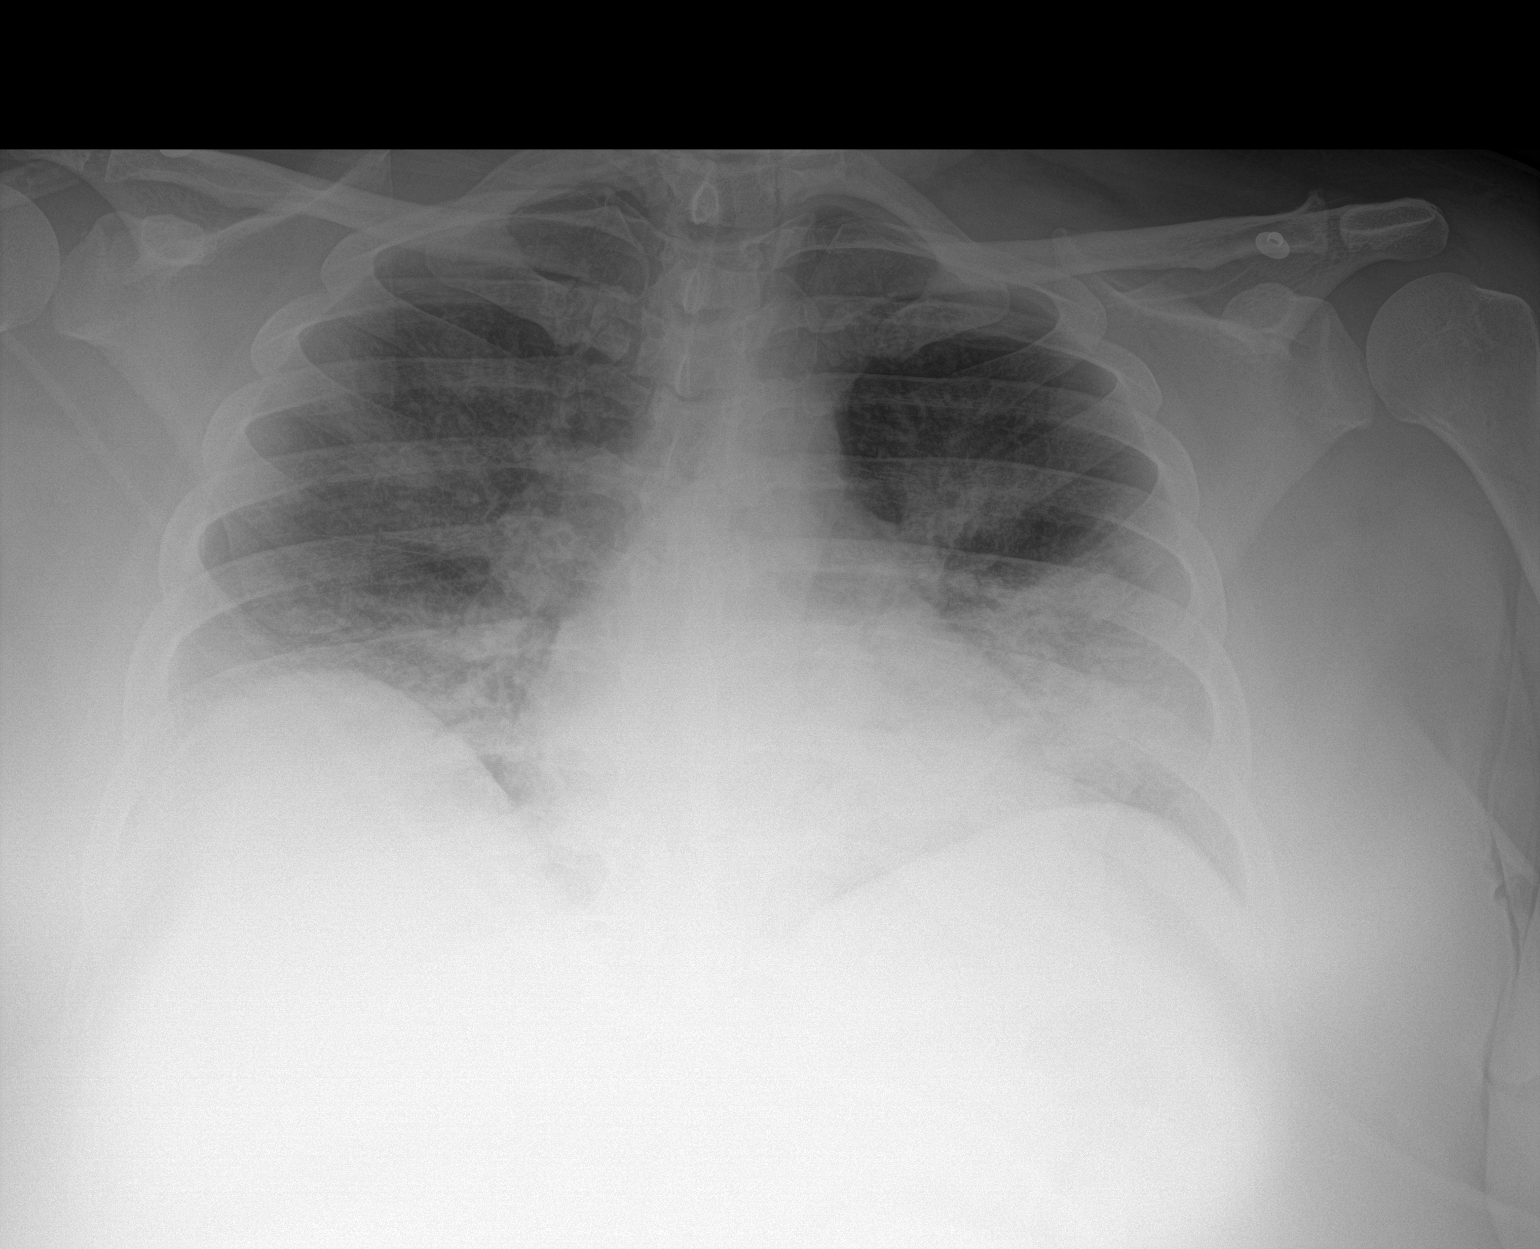

[1 of 1 positions shown; findings below may reference images not displayed]

FINDINGS: Hypoinflated lungs. Patchy bilateral pulmonary airspace opacities.
No pneumothorax or pleural effusion. Cardiomediastinal silhouette
within normal limits. No acute osseous abnormality.
IMPRESSION: Multifocal pneumonia.

## 2021-03-02 ENCOUNTER — Inpatient Hospital Stay: Payer: Medicaid Other

## 2021-03-02 ENCOUNTER — Other Ambulatory Visit: Payer: Self-pay

## 2021-03-02 VITALS — BP 132/78 | HR 78 | Temp 98.2°F | Resp 18

## 2021-03-02 DIAGNOSIS — D509 Iron deficiency anemia, unspecified: Secondary | ICD-10-CM

## 2021-03-02 DIAGNOSIS — D259 Leiomyoma of uterus, unspecified: Secondary | ICD-10-CM

## 2021-03-02 DIAGNOSIS — Z79899 Other long term (current) drug therapy: Secondary | ICD-10-CM | POA: Diagnosis not present

## 2021-03-02 MED ORDER — LORATADINE 10 MG PO TABS
10.0000 mg | ORAL_TABLET | Freq: Once | ORAL | Status: AC
  Administered 2021-03-02: 10 mg via ORAL

## 2021-03-02 MED ORDER — SODIUM CHLORIDE 0.9 % IV SOLN
750.0000 mg | Freq: Once | INTRAVENOUS | Status: AC
  Administered 2021-03-02: 750 mg via INTRAVENOUS
  Filled 2021-03-02: qty 750

## 2021-03-02 NOTE — Progress Notes (Signed)
Patient waited full 30 minutes with no issues after iron infusion

## 2021-04-07 ENCOUNTER — Ambulatory Visit
Admission: EM | Admit: 2021-04-07 | Discharge: 2021-04-07 | Disposition: A | Discharge status: Home / Self Care | Payer: Medicaid Other

## 2021-04-07 ENCOUNTER — Other Ambulatory Visit: Payer: Self-pay

## 2021-04-07 NOTE — ED Notes (Signed)
Staff called patient four times and phone went straight to voicemail. Then I went outside and knocked on three cars for last name and neither one was patient. Patient will be discharged due to not been able to contact on multiple times

## 2021-04-10 ENCOUNTER — Ambulatory Visit: Payer: Medicaid Other

## 2021-05-07 ENCOUNTER — Ambulatory Visit
Admission: EM | Admit: 2021-05-07 | Discharge: 2021-05-07 | Disposition: A | Discharge status: Home / Self Care | Payer: Medicaid Other | Attending: Internal Medicine | Admitting: Internal Medicine

## 2021-05-07 ENCOUNTER — Other Ambulatory Visit: Payer: Self-pay

## 2021-05-07 ENCOUNTER — Encounter (HOSPITAL_BASED_OUTPATIENT_CLINIC_OR_DEPARTMENT_OTHER): Payer: Self-pay | Admitting: Obstetrics and Gynecology

## 2021-05-07 DIAGNOSIS — J029 Acute pharyngitis, unspecified: Secondary | ICD-10-CM | POA: Diagnosis present

## 2021-05-07 DIAGNOSIS — J101 Influenza due to other identified influenza virus with other respiratory manifestations: Secondary | ICD-10-CM

## 2021-05-07 DIAGNOSIS — J069 Acute upper respiratory infection, unspecified: Secondary | ICD-10-CM | POA: Insufficient documentation

## 2021-05-07 HISTORY — DX: Influenza due to other identified influenza virus with other respiratory manifestations: J10.1

## 2021-05-07 LAB — POCT RAPID STREP A (OFFICE): Rapid Strep A Screen: NEGATIVE

## 2021-05-07 MED ORDER — FLUTICASONE PROPIONATE 50 MCG/ACT NA SUSP: 1.0000 | Freq: Every day | NASAL | 0 refills | Status: DC

## 2021-05-07 MED ORDER — BENZONATATE 100 MG PO CAPS: 100.0000 mg | ORAL_CAPSULE | Freq: Three times a day (TID) | ORAL | 0 refills | Status: DC | PRN

## 2021-05-07 MED ORDER — CETIRIZINE HCL 10 MG PO TABS: 10.0000 mg | ORAL_TABLET | Freq: Every day | ORAL | 0 refills | Status: DC

## 2021-05-07 NOTE — ED Provider Notes (Signed)
[name]    CSN: [id number] Arrival date & time: 05/07/21  1803      History   Chief Complaint Chief Complaint  Patient presents with   Cough    HPI [name] is a 35 y.o. female.   Patient presents with itchy throat, nonproductive cough, nasal congestion, sore throat, runny nose that started today.  Son has had similar symptoms.  Patient denies any known fevers.  Denies chest pain, shortness of breath, ear pain, nausea, vomiting, diarrhea, abdominal pain.  Patient has been using cough drops with minimal relief of cough.   Cough  Past Medical History:  Diagnosis Date   Abnormal Pap smear    colposcopy, mild dysplasia, HPV   Acid reflux    Anemia    Anxiety    on meds- stable   Bronchitis    Chlamydia    COVID-19    Depression    on meds for bipolar   Fibroid    Gonorrhea    History of PID    Hypertension    Menorrhagia    Obesity    Panic attack    Pneumonia    Pregnancy induced hypertension    Sleep apnea    hasnt uses CPAP x 4 years   Trichomonas    Urinary tract infection    Uterine leiomyoma 05/01/2020    Patient Active Problem List   Diagnosis Date Noted   S/P myomectomy 08/06/2020   Uterine fibroid 08/06/2020   Uterine leiomyoma 05/01/2020   Acute hypoxemic respiratory failure due to COVID-19 ([place]) 04/20/2020   Iron deficiency anemia 03/06/2020   Incarcerated incisional hernia s/p lap repair 12/02/2012 12/03/2012   Hypertension    Acid reflux    Anxiety    Obesity, Class III, BMI 40-49.9 (morbid obesity) ([place])    Sleep apnea    Chronic cholecystitis with calculus s/p lap chole [id number] 10/04/2012    Past Surgical History:  Procedure Laterality Date   ABDOMINAL SURGERY     CESAREAN SECTION  05/02/03,05/04/08,10/23/10   x3   CHOLECYSTECTOMY N/A 10/07/2012   Procedure: LAPAROSCOPIC CHOLECYSTECTOMY WITH INTRAOPERATIVE CHOLANGIOGRAM;  Surgeon: Imogene Burn. Georgette Dover, MD;  Location: [facility];  Service: General;  Laterality: N/A;    ESOPHAGOGASTRODUODENOSCOPY (EGD) WITH PROPOFOL N/A 10/08/2018   Procedure: ESOPHAGOGASTRODUODENOSCOPY (EGD) WITH PROPOFOL;  Surgeon: Carol Ada, MD;  [facility];  Service: Endoscopy;  Laterality: N/A;   FOOT SURGERY     HERNIA REPAIR     INSERTION OF MESH N/A 12/02/2012   Procedure: INSERTION OF MESH;  Surgeon: Adin Hector, MD;  Location: [facility];  Service: General;  Laterality: N/A;   MYOMECTOMY N/A 08/06/2020   Procedure: ABDOMINAL MYOMECTOMY;  Surgeon: Janyth Contes, MD;  Location: [place];  Service: Gynecology;  Laterality: N/A;  TAP BLOCK and general   TUBAL LIGATION     UMBILICAL HERNIA REPAIR N/A 12/02/2012   Procedure: LAPAROSCOPIC UMBILICAL HERNIA;  Surgeon: Adin Hector, MD;  Location: [facility];  Service: General;  Laterality: N/A;   WISDOM TOOTH EXTRACTION      OB History     Gravida  3   Para  3   Term  3   Preterm      AB      Living  3      SAB      IAB      Ectopic      Multiple      Live Births  2  Home Medications    Prior to Admission medications   Medication Sig Start Date End Date Taking? Authorizing Provider  albuterol (PROAIR HFA) 108 (90 Base) MCG/ACT inhaler 2 puff(s) 04/09/21  Yes [provider]  benzonatate (TESSALON) 100 MG capsule Take 1 capsule (100 mg total) by mouth every 8 (eight) hours as needed for cough. 05/07/21  Yes , Michele Rockers, FNP  cetirizine (ZYRTEC) 10 MG tablet Take 1 tablet (10 mg total) by mouth daily for 10 days. 05/07/21 05/17/21 Yes , Michele Rockers, FNP  fluticasone (FLONASE) 50 MCG/ACT nasal spray Place 1 spray into both nostrils daily for 3 days. 05/07/21 05/10/21 Yes , Michele Rockers, FNP  amLODipine (NORVASC) 10 MG tablet Take 10 mg by mouth daily. 09/07/18   [provider]  Clobetasol Propionate 0.05 % shampoo Apply 1 application topically daily. Patient not taking: Reported on 01/10/2021 04/03/20   [provider]  Cyanocobalamin (B-12) 1000 MCG SUBL Place  1,000 mcg under the tongue daily. 03/12/20   Brunetta Genera, MD  cycloSPORINE (RESTASIS) 0.05 % ophthalmic emulsion Place 1 drop into both eyes 2 (two) times daily as needed (dry eyes).    [provider]  ergocalciferol (VITAMIN D2) 1.25 MG (50000 UT) capsule Take 50,000 Units by mouth every Thursday.    [provider]  HYDROcodone-acetaminophen (NORCO/VICODIN) 5-325 MG tablet Take 1-2 tablets by mouth every 6 (six) hours as needed for moderate pain. Patient not taking: Reported on 01/10/2021 08/07/20   Bovard-Stuckert, Jeral Fruit, MD  ibuprofen (ADVIL) 800 MG tablet Take 1 tablet (800 mg total) by mouth every 8 (eight) hours as needed for moderate pain (mild pain). 08/07/20   Bovard-Stuckert, Jeral Fruit, MD  levonorgestrel (MIRENA, 52 MG,) 20 MCG/DAY IUD Mirena 20 mcg/24 hours (8 yrs) 52 mg intrauterine device  1 device provided by [facility]    [provider]  Olopatadine HCl 0.2 % SOLN Place 1 drop into both eyes daily as needed (irritation). 01/11/20   [provider]  omeprazole (PRILOSEC) 20 MG capsule Take 20 mg by mouth daily as needed (acid reflux).     [provider]    Family History Family History  Problem Relation Age of Onset   Hypertension Mother    Heart disease Father    Stroke Father    Other Neg Hx     Social History Social History   Tobacco Use   Smoking status: Former    Packs/day: 0.25    Years: 1.00    Pack years: 0.25    Types: Cigarettes   Smokeless tobacco: Never  Vaping Use   Vaping Use: Never used  Substance Use Topics   Alcohol use: No    Comment: socially   Drug use: No     Allergies   Percocet [oxycodone-acetaminophen]   Review of Systems Review of Systems Per HPI  Physical Exam Triage Vital Signs ED Triage Vitals  Enc Vitals Group     BP 05/07/21 1830 132/88     Pulse Rate 05/07/21 1830 97     Resp 05/07/21 1830 18     Temp 05/07/21 1830 98.8 F (37.1 C)     Temp Source 05/07/21 1830 Oral      SpO2 05/07/21 1830 98 %     Weight --      Height --      Head Circumference --      Peak Flow --      Pain Score 05/07/21 1834 4  Pain Loc --      Pain Edu? --      Excl. in [place]? --    No data found.  Updated Vital Signs BP 132/88 (BP Location: Left Arm)   Pulse 97   Temp 98.8 F (37.1 C) (Oral)   Resp 18   SpO2 98%   Visual Acuity Right Eye Distance:   Left Eye Distance:   Bilateral Distance:    Right Eye Near:   Left Eye Near:    Bilateral Near:     Physical Exam Constitutional:      General: She is not in acute distress.    Appearance: Normal appearance. She is not toxic-appearing or diaphoretic.  HENT:     Head: Normocephalic and atraumatic.     Right Ear: Ear canal normal. A middle ear effusion is present. Tympanic membrane is not perforated, erythematous or bulging.     Left Ear: Ear canal normal. Tympanic membrane is not perforated, erythematous or bulging.     Nose: Congestion present.     Mouth/Throat:     Mouth: Mucous membranes are moist.     Pharynx: Posterior oropharyngeal erythema present.  Eyes:     Extraocular Movements: Extraocular movements intact.     Conjunctiva/sclera: Conjunctivae normal.     Pupils: Pupils are equal, round, and reactive to light.  Cardiovascular:     Rate and Rhythm: Normal rate and regular rhythm.     Pulses: Normal pulses.     Heart sounds: Normal heart sounds.  Pulmonary:     Effort: Pulmonary effort is normal. No respiratory distress.     Breath sounds: Normal breath sounds. No wheezing.  Abdominal:     General: Abdomen is flat. Bowel sounds are normal.     Palpations: Abdomen is soft.  Musculoskeletal:        General: Normal range of motion.     Cervical back: Normal range of motion.  Skin:    General: Skin is warm and dry.  Neurological:     General: No focal deficit present.     Mental Status: She is alert and oriented to person, place, and time. Mental status is at baseline.  Psychiatric:        Mood  and Affect: Mood normal.        Behavior: Behavior normal.     UC Treatments / Results  Labs (all labs ordered are listed, but only abnormal results are displayed) Labs Reviewed  CULTURE, GROUP A STREP ([place])  COVID-19, FLU A+B NAA  POCT RAPID STREP A (OFFICE)    EKG   Radiology No results found.  Procedures Procedures (including critical care time)  Medications Ordered in UC Medications - No data to display  Initial Impression / Assessment and Plan / UC Course  I have reviewed the triage vital signs and the nursing notes.  Pertinent labs & imaging results that were available during my care of the patient were reviewed by me and considered in my medical decision making (see chart for details).     Patient presents with symptoms likely from a viral upper respiratory infection. Differential includes bacterial pneumonia, sinusitis, allergic rhinitis, Covid 19, flu. Do not suspect underlying cardiopulmonary process. Symptoms seem unlikely related to ACS, CHF or COPD exacerbations, pneumonia, pneumothorax. Patient is nontoxic appearing and not in need of emergent medical intervention.  Rapid strep test was negative.  Throat culture, COVID-19, flu test pending.  Recommended symptom control with over the counter medications are safe with high  blood pressure.  Patient was offered prescriptions.  Return if symptoms fail to improve in 1-2 weeks or you develop shortness of breath, chest pain, severe headache. Patient states understanding and is agreeable.  Discharged with PCP followup.  Final Clinical Impressions(s) / UC Diagnoses   Final diagnoses:  Viral upper respiratory tract infection with cough  Sore throat     Discharge Instructions      It appears that you have a viral upper respiratory infection that should resolve in the next few days with symptomatic treatment. You have been prescribed 3 medications that should help alleviate symptoms.  Rapid strep test was  negative.  Throat culture is pending.  COVID-19 and flu swab are also pending.  We will call if they are positive.  You may also try Coricidin HBP over-the-counter as this is safe with your blood pressure.    ED Prescriptions     Medication Sig Dispense Auth. Provider   benzonatate (TESSALON) 100 MG capsule Take 1 capsule (100 mg total) by mouth every 8 (eight) hours as needed for cough. 21 capsule [place], [place] E, [place]   cetirizine (ZYRTEC) 10 MG tablet Take 1 tablet (10 mg total) by mouth daily for 10 days. 30 tablet [place], [place] E, [place]   fluticasone [facility]) 50 MCG/ACT nasal spray Place 1 spray into both nostrils daily for 3 days. 16 g Teodora Medici, [place]      PDMP not reviewed this encounter.   Teodora Medici, [place] 05/07/21 1944

## 2021-05-07 NOTE — ED Triage Notes (Signed)
Onset yesterday of itchy/irritated throat and cough that has worsened as of today. Onset today of rhinorrhea. Has been using cough drops w/minimal relief. Denies dysphagia.   Son with upper respiratory infection.

## 2021-05-07 NOTE — Discharge Instructions (Signed)
It appears that you have a viral upper respiratory infection that should resolve in the next few days with symptomatic treatment. You have been prescribed 3 medications that should help alleviate symptoms.  Rapid strep test was negative.  Throat culture is pending.  COVID-19 and flu swab are also pending.  We will call if they are positive.  You may also try Coricidin HBP over-the-counter as this is safe with your blood pressure.

## 2021-05-08 LAB — COVID-19, FLU A+B NAA
Influenza A, NAA: DETECTED — AB
Influenza B, NAA: NOT DETECTED
SARS-CoV-2, NAA: NOT DETECTED

## 2021-05-08 NOTE — Progress Notes (Signed)
Left message with patient  influenza a test was positive and per anesthesia guidelines surgery should be rescheduled for 4 to 6 weeks out.Left message for patient to call me back and let me know she received this voice mail message.

## 2021-05-08 NOTE — Progress Notes (Signed)
Spoke with pt by phone and pt stated having cough congestion and runny nose since 05-06-2021, pt took covid and strep test 05-07-2021 at [facility] urgent care results pending. Called hannah at dr bovard office and made aware.

## 2021-05-10 ENCOUNTER — Ambulatory Visit (INDEPENDENT_AMBULATORY_CARE_PROVIDER_SITE_OTHER): Payer: Medicaid Other

## 2021-05-10 ENCOUNTER — Ambulatory Visit
Admission: EM | Admit: 2021-05-10 | Discharge: 2021-05-10 | Disposition: A | Discharge status: Home / Self Care | Payer: Medicaid Other | Attending: Internal Medicine | Admitting: Internal Medicine

## 2021-05-10 ENCOUNTER — Other Ambulatory Visit: Payer: Self-pay

## 2021-05-10 DIAGNOSIS — J111 Influenza due to unidentified influenza virus with other respiratory manifestations: Secondary | ICD-10-CM

## 2021-05-10 DIAGNOSIS — R059 Cough, unspecified: Secondary | ICD-10-CM

## 2021-05-10 DIAGNOSIS — R062 Wheezing: Secondary | ICD-10-CM

## 2021-05-10 MED ORDER — PREDNISONE 20 MG PO TABS: 20.0000 mg | ORAL_TABLET | Freq: Every day | ORAL | 0 refills | Status: AC

## 2021-05-10 MED ORDER — ALBUTEROL SULFATE HFA 108 (90 BASE) MCG/ACT IN AERS: 1.0000 | INHALATION_SPRAY | Freq: Four times a day (QID) | RESPIRATORY_TRACT | 0 refills | Status: DC | PRN

## 2021-05-10 NOTE — Discharge Instructions (Signed)
Your chest x-ray was normal. You have been prescribed prednisone steroid and albuterol inhaler to help alleviate wheezing and symptoms.  Please follow-up if symptoms persist.  Go to the hospital if shortness of breath develops.

## 2021-05-10 NOTE — ED Provider Notes (Signed)
[name]    CSN: [id number] Arrival date & time: 05/10/21  1312      History   Chief Complaint Chief Complaint  Patient presents with   Cough    HPI [name] is a 35 y.o. female.   Patient presents for worsening symptoms and reevaluation after being seen on 05/07/2021.  Patient was seen on 05/07/2021 and received a positive flu test.  Patient was prescribed symptom management as flu test was not rapid.  Patient reports that symptoms are not improved and she has noticed some wheezing.  Denies chest pain, shortness of breath, sore throat, ear pain, fevers.   Cough  Past Medical History:  Diagnosis Date   Abnormal Pap smear    colposcopy, mild dysplasia, HPV   Acid reflux    Anemia    Anxiety    on meds- stable   Bronchitis    Chlamydia    COVID-19    Depression    on meds for bipolar   Fibroid    Gonorrhea    History of PID    Hypertension    Menorrhagia    Obesity    Panic attack    Pneumonia    Pregnancy induced hypertension    Sleep apnea    hasnt uses CPAP x 4 years   Trichomonas    Urinary tract infection    Uterine leiomyoma 05/01/2020    Patient Active Problem List   Diagnosis Date Noted   S/P myomectomy 08/06/2020   Uterine fibroid 08/06/2020   Uterine leiomyoma 05/01/2020   Acute hypoxemic respiratory failure due to COVID-19 ([place]) 04/20/2020   Iron deficiency anemia 03/06/2020   Incarcerated incisional hernia s/p lap repair 12/02/2012 12/03/2012   Hypertension    Acid reflux    Anxiety    Obesity, Class III, BMI 40-49.9 (morbid obesity) ([place])    Sleep apnea    Chronic cholecystitis with calculus s/p lap chole [id number] 10/04/2012    Past Surgical History:  Procedure Laterality Date   ABDOMINAL SURGERY     CESAREAN SECTION  05/02/03,05/04/08,10/23/10   x3   CHOLECYSTECTOMY N/A 10/07/2012   Procedure: LAPAROSCOPIC CHOLECYSTECTOMY WITH INTRAOPERATIVE CHOLANGIOGRAM;  Surgeon: Imogene Burn. Georgette Dover, MD;  Location: [facility];   Service: General;  Laterality: N/A;   ESOPHAGOGASTRODUODENOSCOPY (EGD) WITH PROPOFOL N/A 10/08/2018   Procedure: ESOPHAGOGASTRODUODENOSCOPY (EGD) WITH PROPOFOL;  Surgeon: Carol Ada, MD;  Location: [facility];  Service: Endoscopy;  Laterality: N/A;   FOOT SURGERY     HERNIA REPAIR     INSERTION OF MESH N/A 12/02/2012   Procedure: INSERTION OF MESH;  Surgeon: Adin Hector, MD;  Location: [facility];  Service: General;  Laterality: N/A;   MYOMECTOMY N/A 08/06/2020   Procedure: ABDOMINAL MYOMECTOMY;  Surgeon: Janyth Contes, MD;  Location: [place];  Service: Gynecology;  Laterality: N/A;  TAP BLOCK and general   TUBAL LIGATION     UMBILICAL HERNIA REPAIR N/A 12/02/2012   Procedure: LAPAROSCOPIC UMBILICAL HERNIA;  Surgeon: Adin Hector, MD;  Location: [facility];  Service: General;  Laterality: N/A;   WISDOM TOOTH EXTRACTION      OB History     Gravida  3   Para  3   Term  3   Preterm      AB      Living  3      SAB      IAB      Ectopic      Multiple  Live Births  2            Home Medications    Prior to Admission medications   Medication Sig Start Date End Date Taking? Authorizing Provider  albuterol (VENTOLIN HFA) 108 (90 Base) MCG/ACT inhaler Inhale 1-2 puffs into the lungs every 6 (six) hours as needed for wheezing or shortness of breath. 05/10/21  Yes , Hildred Alamin E, FNP  predniSONE (DELTASONE) 20 MG tablet Take 1 tablet (20 mg total) by mouth daily for 5 days. 05/10/21 05/15/21 Yes , Michele Rockers, FNP  amLODipine (NORVASC) 10 MG tablet Take 10 mg by mouth daily. 09/07/18   [provider]  benzonatate (TESSALON) 100 MG capsule Take 1 capsule (100 mg total) by mouth every 8 (eight) hours as needed for cough. 05/07/21   Teodora Medici, FNP  cetirizine (ZYRTEC) 10 MG tablet Take 1 tablet (10 mg total) by mouth daily for 10 days. 05/07/21 05/17/21  Teodora Medici, FNP  Clobetasol Propionate 0.05 % shampoo Apply 1 application topically daily. Patient  not taking: Reported on 01/10/2021 04/03/20   [provider]  Cyanocobalamin (B-12) 1000 MCG SUBL Place 1,000 mcg under the tongue daily. 03/12/20   Brunetta Genera, MD  cycloSPORINE (RESTASIS) 0.05 % ophthalmic emulsion Place 1 drop into both eyes 2 (two) times daily as needed (dry eyes).    [provider]  ergocalciferol (VITAMIN D2) 1.25 MG (50000 UT) capsule Take 50,000 Units by mouth every Thursday.    [provider]  fluticasone (FLONASE) 50 MCG/ACT nasal spray Place 1 spray into both nostrils daily for 3 days. 05/07/21 05/10/21  Teodora Medici, FNP  HYDROcodone-acetaminophen (NORCO/VICODIN) 5-325 MG tablet Take 1-2 tablets by mouth every 6 (six) hours as needed for moderate pain. Patient not taking: Reported on 01/10/2021 08/07/20   Bovard-Stuckert, Jeral Fruit, MD  ibuprofen (ADVIL) 800 MG tablet Take 1 tablet (800 mg total) by mouth every 8 (eight) hours as needed for moderate pain (mild pain). 08/07/20   Bovard-Stuckert, Jeral Fruit, MD  levonorgestrel (MIRENA, 52 MG,) 20 MCG/DAY IUD Mirena 20 mcg/24 hours (8 yrs) 52 mg intrauterine device  1 device provided by [place]    [provider]  Olopatadine HCl 0.2 % SOLN Place 1 drop into both eyes daily as needed (irritation). 01/11/20   [provider]  omeprazole (PRILOSEC) 20 MG capsule Take 20 mg by mouth daily as needed (acid reflux).     [provider]    Family History Family History  Problem Relation Age of Onset   Hypertension Mother    Heart disease Father    Stroke Father    Other Neg Hx     Social History Social History   Tobacco Use   Smoking status: Former    Packs/day: 0.25    Years: 1.00    Pack years: 0.25    Types: Cigarettes   Smokeless tobacco: Never  Vaping Use   Vaping Use: Never used  Substance Use Topics   Alcohol use: No    Comment: socially   Drug use: No     Allergies   Percocet [oxycodone-acetaminophen]   Review of Systems Review of Systems Per  HPI  Physical Exam Triage Vital Signs ED Triage Vitals  Enc Vitals Group     BP 05/10/21 1515 (!) 144/90     Pulse Rate 05/10/21 1515 97     Resp 05/10/21 1515 18     Temp 05/10/21 1515 98.2 F (36.8 C)  Temp Source 05/10/21 1515 Oral     SpO2 05/10/21 1515 98 %     Weight --      Height --      Head Circumference --      Peak Flow --      Pain Score 05/10/21 1517 4     Pain Loc --      Pain Edu? --      Excl. in [place]? --    No data found.  Updated Vital Signs BP (!) 144/90 (BP Location: Left Arm)   Pulse 97   Temp 98.2 F (36.8 C) (Oral)   Resp 18   SpO2 98%   Visual Acuity Right Eye Distance:   Left Eye Distance:   Bilateral Distance:    Right Eye Near:   Left Eye Near:    Bilateral Near:     Physical Exam Constitutional:      General: She is not in acute distress.    Appearance: Normal appearance. She is not toxic-appearing or diaphoretic.  HENT:     Head: Normocephalic and atraumatic.     Right Ear: Tympanic membrane and ear canal normal.     Left Ear: Tympanic membrane and ear canal normal.     Nose: Congestion present.     Mouth/Throat:     Mouth: Mucous membranes are moist.     Pharynx: No posterior oropharyngeal erythema.  Eyes:     Extraocular Movements: Extraocular movements intact.     Conjunctiva/sclera: Conjunctivae normal.     Pupils: Pupils are equal, round, and reactive to light.  Cardiovascular:     Rate and Rhythm: Normal rate and regular rhythm.     Pulses: Normal pulses.     Heart sounds: Normal heart sounds.  Pulmonary:     Effort: Pulmonary effort is normal. No respiratory distress.     Breath sounds: No stridor. Wheezing present. No rhonchi.  Abdominal:     General: Abdomen is flat. Bowel sounds are normal.     Palpations: Abdomen is soft.  Musculoskeletal:        General: Normal range of motion.     Cervical back: Normal range of motion.  Skin:    General: Skin is warm and dry.  Neurological:     General: No focal  deficit present.     Mental Status: She is alert and oriented to person, place, and time. Mental status is at baseline.  Psychiatric:        Mood and Affect: Mood normal.        Behavior: Behavior normal.     UC Treatments / Results  Labs (all labs ordered are listed, but only abnormal results are displayed) Labs Reviewed - No data to display  EKG   Radiology DG Chest 2 View  Result Date: 05/10/2021 CLINICAL DATA:  Cough EXAM: CHEST - 2 VIEW COMPARISON:  Radiograph 04/20/2020 FINDINGS: The cardiomediastinal silhouette is within normal limits. There is no focal airspace consolidation. There is no large pleural effusion. There is no visible pneumothorax. There is no acute osseous abnormality. IMPRESSION: No evidence of acute cardiopulmonary disease. Electronically Signed   By: Maurine Simmering M.D.   On: 05/10/2021 15:48    Procedures Procedures (including critical care time)  Medications Ordered in UC Medications - No data to display  Initial Impression / Assessment and Plan / UC Course  I have reviewed the triage vital signs and the nursing notes.  Pertinent labs & imaging results that were available during  my care of the patient were reviewed by me and considered in my medical decision making (see chart for details).     Chest x-ray was negative for any acute cardiopulmonary process.  Chest x-ray completed due to patient's report of history of pneumonia.  Will prescribe prednisone steroid to decrease inflammation as well as albuterol inhaler.  No red flags on exam and patient is not respiratory distress.  Discussed strict return precautions.  Patient verbalized understanding and was agreeable with plan. Final Clinical Impressions(s) / UC Diagnoses   Final diagnoses:  Influenza  Wheezing     Discharge Instructions      Your chest x-ray was normal. You have been prescribed prednisone steroid and albuterol inhaler to help alleviate wheezing and symptoms.  Please follow-up if  symptoms persist.  Go to the hospital if shortness of breath develops.     ED Prescriptions     Medication Sig Dispense Auth. Provider   predniSONE (DELTASONE) 20 MG tablet Take 1 tablet (20 mg total) by mouth daily for 5 days. 5 tablet [place], [place] E, [place]   albuterol (VENTOLIN HFA) 108 (90 Base) MCG/ACT inhaler Inhale 1-2 puffs into the lungs every 6 (six) hours as needed for wheezing or shortness of breath. 1 each Teodora Medici, [place]      PDMP not reviewed this encounter.   Teodora Medici, [place] 05/10/21 [phone]

## 2021-05-10 NOTE — ED Triage Notes (Signed)
Pt was last seen on 05/07/21 dx with flu. Today, Pt reports that her sxs have worsened as of yesterday noting that her chest is tender from coughing and she's is now wheezing. Denies CP and sob.

## 2021-05-11 LAB — CULTURE, GROUP A STREP (THRC)

## 2021-05-13 ENCOUNTER — Inpatient Hospital Stay (HOSPITAL_COMMUNITY): Admission: RE | Admit: 2021-05-13 | Payer: Medicaid Other | Source: Ambulatory Visit

## 2021-05-16 ENCOUNTER — Ambulatory Visit (HOSPITAL_BASED_OUTPATIENT_CLINIC_OR_DEPARTMENT_OTHER)
Admission: RE | Admit: 2021-05-16 | Payer: Medicaid Other | Source: Home / Self Care | Admitting: Obstetrics and Gynecology

## 2021-05-16 DIAGNOSIS — N924 Excessive bleeding in the premenopausal period: Secondary | ICD-10-CM

## 2021-05-16 HISTORY — DX: Excessive and frequent menstruation with regular cycle: N92.0

## 2021-05-16 SURGERY — HYSTERECTOMY, VAGINAL, LAPAROSCOPY-ASSISTED, WITH SALPINGECTOMY
Anesthesia: General

## 2021-07-03 DIAGNOSIS — N939 Abnormal uterine and vaginal bleeding, unspecified: Secondary | ICD-10-CM | POA: Insufficient documentation

## 2021-07-10 ENCOUNTER — Other Ambulatory Visit: Payer: Self-pay | Admitting: *Deleted

## 2021-07-10 DIAGNOSIS — E538 Deficiency of other specified B group vitamins: Secondary | ICD-10-CM

## 2021-07-10 DIAGNOSIS — D509 Iron deficiency anemia, unspecified: Secondary | ICD-10-CM

## 2021-07-11 ENCOUNTER — Inpatient Hospital Stay: Payer: Medicaid Other | Attending: Hematology

## 2021-07-11 ENCOUNTER — Other Ambulatory Visit: Payer: Self-pay

## 2021-07-11 ENCOUNTER — Encounter: Payer: Self-pay | Admitting: Hematology

## 2021-07-11 ENCOUNTER — Inpatient Hospital Stay (HOSPITAL_BASED_OUTPATIENT_CLINIC_OR_DEPARTMENT_OTHER): Payer: Medicaid Other | Admitting: Hematology

## 2021-07-11 VITALS — BP 140/103 | HR 83 | Temp 97.5°F | Resp 20 | Wt 310.5 lb

## 2021-07-11 DIAGNOSIS — E538 Deficiency of other specified B group vitamins: Secondary | ICD-10-CM

## 2021-07-11 DIAGNOSIS — D509 Iron deficiency anemia, unspecified: Secondary | ICD-10-CM

## 2021-07-11 LAB — CMP (CANCER CENTER ONLY)
ALT: 11 U/L (ref 0–44)
AST: 14 U/L — ABNORMAL LOW (ref 15–41)
Albumin: 3.9 g/dL (ref 3.5–5.0)
Alkaline Phosphatase: 68 U/L (ref 38–126)
Anion gap: 5 (ref 5–15)
BUN: 8 mg/dL (ref 6–20)
CO2: 31 mmol/L (ref 22–32)
Calcium: 9.1 mg/dL (ref 8.9–10.3)
Chloride: 103 mmol/L (ref 98–111)
Creatinine: 0.6 mg/dL (ref 0.44–1.00)
GFR, Estimated: >60 mL/min (ref >60)
Glucose, Bld: 99 mg/dL (ref 70–99)
Potassium: 3.1 mmol/L — ABNORMAL LOW (ref 3.5–5.1)
Sodium: 139 mmol/L (ref 135–145)
Total Bilirubin: 0.4 mg/dL (ref 0.3–1.2)
Total Protein: 7.2 g/dL (ref 6.5–8.1)

## 2021-07-11 LAB — CBC WITH DIFFERENTIAL (CANCER CENTER ONLY)
Abs Immature Granulocytes: 0.03 10*3/uL (ref 0.00–0.07)
Basophils Absolute: 0 10*3/uL (ref 0.0–0.1)
Basophils Relative: 0 %
Eosinophils Absolute: 0.1 10*3/uL (ref 0.0–0.5)
Eosinophils Relative: 1 %
HCT: 39.2 % (ref 36.0–46.0)
Hemoglobin: 13.3 g/dL (ref 12.0–15.0)
Immature Granulocytes: 0 %
Lymphocytes Relative: 30 %
Lymphs Abs: 3.8 10*3/uL (ref 0.7–4.0)
MCH: 28.4 pg (ref 26.0–34.0)
MCHC: 33.9 g/dL (ref 30.0–36.0)
MCV: 83.6 fL (ref 80.0–100.0)
Monocytes Absolute: 0.6 10*3/uL (ref 0.1–1.0)
Monocytes Relative: 5 %
Neutro Abs: 7.9 10*3/uL — ABNORMAL HIGH (ref 1.7–7.7)
Neutrophils Relative %: 64 %
Platelet Count: 372 10*3/uL (ref 150–400)
RBC: 4.69 MIL/uL (ref 3.87–5.11)
RDW: 13.4 % (ref 11.5–15.5)
WBC Count: 12.5 10*3/uL — ABNORMAL HIGH (ref 4.0–10.5)
nRBC: 0 % (ref 0.0–0.2)

## 2021-07-11 LAB — IRON AND IRON BINDING CAPACITY (CC-WL,HP ONLY)
Iron: 46 ug/dL (ref 28–170)
Saturation Ratios: 15 % (ref 10.4–31.8)
TIBC: 318 ug/dL (ref 250–450)
UIBC: 272 ug/dL (ref 148–442)

## 2021-07-11 LAB — FERRITIN: Ferritin: 78 ng/mL (ref 11–307)

## 2021-07-11 LAB — VITAMIN B12: Vitamin B-12: 427 pg/mL (ref 180–914)

## 2021-07-12 ENCOUNTER — Telehealth: Payer: Self-pay | Admitting: Hematology

## 2021-07-12 NOTE — Telephone Encounter (Signed)
Left message with follow-up appointment per 2/2 los.

## 2021-07-17 ENCOUNTER — Encounter: Payer: Self-pay | Admitting: Hematology

## 2021-07-17 ENCOUNTER — Other Ambulatory Visit: Payer: Self-pay

## 2021-07-17 ENCOUNTER — Encounter (HOSPITAL_BASED_OUTPATIENT_CLINIC_OR_DEPARTMENT_OTHER): Payer: Self-pay | Admitting: Obstetrics and Gynecology

## 2021-07-17 NOTE — Progress Notes (Addendum)
HEMATOLOGY/ONCOLOGY CLINIC NOTE  Date of Service: .07/11/2021   Patient Care Team: [name], PA as PCP - General (Physician Assistant)  CHIEF COMPLAINTS/PURPOSE OF CONSULTATION:  Follow-up for continued evaluation and management of iron deficiency anemia  HISTORY OF PRESENTING ILLNESS:  See previous notes for details on initial presentation  INTERVAL HISTORY:  [name] is here for continued evaluation management of iron deficiency anemia. Patient continues to have heavy periods.  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 Pt was hospitalized due to low oxygen levels.   Depression    on meds for bipolar   Fibroid    Gonorrhea    History of PID    Hypertension    Influenza A 05/07/2021   Menorrhagia    Obesity    Panic attack    Pneumonia    05/2020   Pre-diabetes    Pregnancy induced hypertension    PTSD (post-traumatic stress disorder)    Sleep apnea    Patient had a sleep study on 07/10/19 that revealed moderate sleep apnea. Per 07/17/21 phone call with patient, she wears a CPAP nightly.   Trichomonas    Urinary tract infection    Uterine leiomyoma 05/01/2020   Wears glasses     SURGICAL HISTORY: Past Surgical History:  Procedure Laterality Date   ABDOMINAL SURGERY     CESAREAN SECTION  05/02/03,05/04/08,10/23/10   x3   CHOLECYSTECTOMY N/A 10/07/2012   Procedure: LAPAROSCOPIC CHOLECYSTECTOMY WITH INTRAOPERATIVE CHOLANGIOGRAM;  Surgeon: Imogene Burn. Georgette Dover, MD;  [facility];  Service: General;   Laterality: N/A;   ESOPHAGOGASTRODUODENOSCOPY (EGD) WITH PROPOFOL N/A 10/08/2018   Procedure: ESOPHAGOGASTRODUODENOSCOPY (EGD) WITH PROPOFOL;  Surgeon: Carol Ada, MD;  [facility];  Service: Endoscopy;  Laterality: N/A;   FOOT SURGERY  1995   INSERTION OF MESH N/A 12/02/2012   Procedure: INSERTION OF MESH;  Surgeon: Adin Hector, MD;  [facility];  Service: General;  Laterality: N/A;   MYOMECTOMY N/A 08/06/2020   Procedure: ABDOMINAL MYOMECTOMY;  Surgeon: Janyth Contes, MD;  Location: [facility];  Service: Gynecology;  Laterality: N/A;  TAP BLOCK and general   TUBAL LIGATION  9163   UMBILICAL HERNIA REPAIR N/A 12/02/2012   Procedure: LAPAROSCOPIC UMBILICAL HERNIA;  Surgeon: Adin Hector, MD;  [facility];  Service: General;  Laterality: N/A;   WISDOM TOOTH EXTRACTION  2001    SOCIAL HISTORY: Social History   Socioeconomic History   Marital status: Single    Spouse name: Not on file   Number of children: Not on file   Years of education: Not on file   Highest education level: Not on file  Occupational History   Not on file  Tobacco Use   Smoking status: Former    Packs/day: 0.25    Years: 1.00    Pack years: 0.25    Types: Cigarettes    Quit date: 2014    Years since quitting: 9.1  Smokeless tobacco: Never  Vaping Use   Vaping Use: Never used  Substance and Sexual Activity   Alcohol use: No   Drug use: No   Sexual activity: Yes    Birth control/protection: I.U.D.  Other Topics Concern   Not on file  Social History Narrative   Not on file   Social Determinants of Health   Financial Resource Strain: Not on file  Food Insecurity: Not on file  Transportation Needs: Not on file  Physical Activity: Not on file  Stress: Not on file  Social Connections: Not on file  Intimate Partner Violence: Not on file    FAMILY HISTORY: Family History  Problem Relation Age of Onset   Hypertension Mother    Heart disease Father    Stroke Father     Other Neg Hx     ALLERGIES:  is allergic to percocet [oxycodone-acetaminophen].  MEDICATIONS:  Current Outpatient Medications  Medication Sig Dispense Refill   albuterol (VENTOLIN HFA) 108 (90 Base) MCG/ACT inhaler Inhale 1-2 puffs into the lungs every 6 (six) hours as needed for wheezing or shortness of breath. 1 each 0   amLODipine (NORVASC) 10 MG tablet Take 10 mg by mouth daily.     Cyanocobalamin (B-12) 1000 MCG SUBL Place 1,000 mcg under the tongue daily. 30 tablet 5   cycloSPORINE (RESTASIS) 0.05 % ophthalmic emulsion Place 1 drop into both eyes 2 (two) times daily as needed (dry eyes).     ergocalciferol (VITAMIN D2) 1.25 MG (50000 UT) capsule Take 50,000 Units by mouth every Thursday.     ibuprofen (ADVIL) 800 MG tablet Take 1 tablet (800 mg total) by mouth every 8 (eight) hours as needed for moderate pain (mild pain). 45 tablet 1   levonorgestrel (MIRENA, 52 MG,) 20 MCG/DAY IUD Mirena 20 mcg/24 hours (8 yrs) 52 mg intrauterine device  1 [facility]     Multiple Vitamin (MULTIVITAMIN WITH MINERALS) TABS tablet Take 1 tablet by mouth daily.     omeprazole (PRILOSEC) 20 MG capsule Take 20 mg by mouth daily as needed (acid reflux).      potassium chloride SA (KLOR-CON M) 20 MEQ tablet Take 20 mEq by mouth daily. X 5 days     No current facility-administered medications for this visit.    REVIEW OF SYSTEMS:   10 Point review of Systems was done is negative except as noted above.  PHYSICAL EXAMINATION: ECOG PERFORMANCE STATUS: 1 - Symptomatic but completely ambulatory  . Vitals:   07/11/21 1010  BP: (!) 140/103  Pulse: 83  Resp: 20  Temp: (!) 97.5 F (36.4 C)  SpO2: 100%    Filed Weights   07/11/21 1010  Weight: (!) 310 lb 8 oz (140.8 kg)    .Body mass index is 51.67 kg/m.  NAD GENERAL:alert, in no acute distress and comfortable SKIN: no acute rashes, no significant lesions EYES: conjunctiva are pink and non-injected, sclera  anicteric OROPHARYNX: MMM, no exudates, no oropharyngeal erythema or ulceration NECK: supple, no JVD LYMPH:  no palpable lymphadenopathy in the cervical, axillary or inguinal regions LUNGS: clear to auscultation b/l with normal respiratory effort HEART: regular rate & rhythm ABDOMEN:  normoactive bowel sounds , non tender, not distended. Extremity: no pedal edema PSYCH: alert & oriented x 3 with fluent speech NEURO: no focal motor/sensory deficits  LABORATORY DATA:  I have reviewed the data as listed  .Marland Kitchen CBC Latest Ref Rng & Units 07/25/2021 07/11/2021 01/10/2021  WBC 4.0 - 10.5 K/uL  12.3(H) 12.5(H) 12.9(H)  Hemoglobin 12.0 - 15.0 g/dL 13.1 13.3 11.7(L)  Hematocrit 36.0 - 46.0 % 39.0 39.2 35.3(L)  Platelets 150 - 400 K/uL 365 372 387     . CMP Latest Ref Rng & Units 07/25/2021 07/11/2021 01/10/2021  Glucose 70 - 99 mg/dL 108(H) 99 100(H)  BUN 6 - 20 mg/dL 10 8 9   Creatinine 0.44 - 1.00 mg/dL 0.49 0.60 0.70  Sodium 135 - 145 mmol/L 138 139 140  Potassium 3.5 - 5.1 mmol/L 2.8(L) 3.1(L) 3.1(L)  Chloride 98 - 111 mmol/L 106 103 105  CO2 22 - 32 mmol/L 25 31 26   Calcium 8.9 - 10.3 mg/dL 8.8(L) 9.1 9.1  Total Protein 6.5 - 8.1 g/dL 7.4 7.2 7.1  Total Bilirubin 0.3 - 1.2 mg/dL 0.3 0.4 0.3  Alkaline Phos 38 - 126 U/L 58 68 68  AST 15 - 41 U/L 16 14(L) 13(L)  ALT 0 - 44 U/L 13 11 10    . Lab Results  Component Value Date   IRON 46 07/11/2021   TIBC 318 07/11/2021   IRONPCTSAT 15 07/11/2021   (Iron and TIBC)  Lab Results  Component Value Date   FERRITIN 78 07/11/2021    Lab Results  Component Value Date   IRON 46 07/11/2021   TIBC 318 07/11/2021   IRONPCTSAT 15 07/11/2021   (Iron and TIBC)  Lab Results  Component Value Date   FERRITIN 78 07/11/2021     RADIOGRAPHIC STUDIES: I have personally reviewed the radiological images as listed and agreed with the findings in the report. No results found.  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 The patient knows to call the clinic with any problems, questions or concerns.    Sullivan Lone MD [place] AAHIVMS [facility] Hematology/Oncology Physician [facility]  (Office):       [phone] (Work cell):  [phone] (Fax):           [phone]  07/28/2021 10:51 PM  I, Reinaldo Raddle, am acting as scribe for Dr. Sullivan Lone, MD.     .I have reviewed the above documentation for accuracy and completeness, and I agree with the above. Brunetta Genera MD ..

## 2021-07-17 NOTE — Progress Notes (Signed)
Spoke w/ via phone for pre-op interview---pt Lab needs dos----urine pregnancy POCT             Lab results------07/25/21 Lab appt for CBC, CMP, EKG, weight check COVID test -----patient states asymptomatic no test needed Arrive at -------0530 on Monday, 07/29/21 NPO after MN NO Solid Food.  Clear liquids from MN until---0430 Med rec completed Medications to take morning of surgery -----Albuterol inhaler prn, Amolodipine, Omeprazole, Restasis eye gtts Diabetic medication -----n/a Patient instructed no nail polish to be worn day of surgery Patient instructed to bring photo id and insurance card day of surgery Patient aware to have Driver (ride ) / caregiver    for 24 hours after surgery - mom Renee Patient Special Instructions -----Extended Recovery Instructions given. Bring inhaler & CPAP_. Pre-Op special Istructions -----Patient is Jehovah's Witness and refuses blood products. I confirmed blood products refusal.with patient on pre-op phone call. Patient verbalized understanding of instructions that were given at this phone interview. Patient denies shortness of breath, chest pain, fever, cough at this phone interview.

## 2021-07-17 NOTE — Progress Notes (Addendum)
PLEASE WEAR A MASK OUT IN PUBLIC AND SOCIAL DISTANCE AND [place] YOUR HANDS FREQUENTLY. PLEASE ASK ALL YOUR CLOSE HOUSEHOLD CONTACT TO WEAR MASK OUT IN PUBLIC AND SOCIAL DISTANCE AND [place] HANDS FREQUENTLY ALSO.      Your procedure is scheduled on Monday, 07/29/21.  Report to [place] AT 5:30 am.   Call this number if you have problems the morning of surgery  :[phone].   OUR ADDRESS IS [address].  [facility] LOCATED IN [facility].  PLEASE BRING YOUR INSURANCE CARD AND PHOTO ID DAY OF SURGERY.  ONLY ONE PERSON ALLOWED IN FACILITY WAITING AREA.                                     REMEMBER:  DO NOT EAT FOOD, CANDY GUM OR MINTS  AFTER MIDNIGHT THE NIGHT BEFORE YOUR SURGERY . YOU MAY HAVE CLEAR LIQUIDS FROM MIDNIGHT THE NIGHT BEFORE YOUR SURGERY UNTIL  4:30 am. NO CLEAR LIQUIDS AFTER   4:30 am DAY OF SURGERY.   YOU MAY  BRUSH YOUR TEETH MORNING OF SURGERY AND RINSE YOUR MOUTH OUT, NO CHEWING GUM CANDY OR MINTS.    CLEAR LIQUID DIET   Foods Allowed                                                                     Foods Excluded  Coffee and tea, regular and decaf                             liquids that you cannot  Plain Jell-O any favor except red or purple                                           see through such as: Fruit ices (not with fruit pulp)                                     milk, soups, orange juice  Iced Popsicles                                    All solid food Carbonated beverages, regular and diet                                    Cranberry, grape and apple juices Sports drinks like Gatorade  Sample Menu Breakfast                                Lunch  Supper Cranberry juice                                           Jell-O                                     Grape juice                           Apple juice Coffee or tea                        Jell-O                                       Popsicle                                                Coffee or tea                        Coffee or tea  _____________________________________________________________________     TAKE THESE MEDICATIONS MORNING OF SURGERY WITH A SIP OF WATER:  Albuterol inhaler if needed, Amlodipine, Omeprazole, and Restasis eye drops if needed  PLEASE BRING INHALER. PLEASE BRING CPAP. YOU CAN LEAVE CPAP IN THE CAR IN CASE YOU NEED IT.  ONE VISITOR IS ALLOWED IN WAITING ROOM ONLY DAY OF SURGERY.  YOU MAY HAVE ANOTHER PERSON SWITCH OUT WITH THE  1  VISITOR IN THE WAITING ROOM DAY OF SURGERY AND A MASK MUST BE WORN IN THE WAITING ROOM.    2 VISITORS  MAY VISIT IN THE EXTENDED RECOVERY ROOM UNTIL 800 PM ONLY 1 VISITOR AGE 93 AND OVER MAY SPEND THE NIGHT AND MUST BE IN EXTENDED RECOVERY ROOM NO LATER THAN 800 PM .    UP TO 2 CHILDREN AGE 60 TO 15 MAY ALSO VISIT IN EXTENDED RECOV ERY ROOM ONLY UNTIL 800 PM AND MUST LEAVE BY 800 PM.   ALL PERSONS VISITING IN EXTENDED RECOVERY ROOM MUST WEAR A MASK.                                    DO NOT WEAR JEWERLY, MAKE UP. DO NOT WEAR LOTIONS, POWDERS, PERFUMES OR NAIL POLISH ON YOUR FINGERNAILS. TOENAIL POLISH IS OK TO WEAR. DO NOT SHAVE FOR 48 HOURS PRIOR TO DAY OF SURGERY. MEN MAY SHAVE FACE AND NECK. CONTACTS, GLASSES, OR DENTURES MAY NOT BE WORN TO SURGERY.                                    [place] IS NOT RESPONSIBLE  FOR ANY BELONGINGS.                                                                    Marland Kitchen  [facility] [place] - Preparing for Surgery Before surgery, you can play an important role.  Because skin is not sterile, your skin needs to be as free of germs as possible.  You can reduce the number of germs on your skin by washing with CHG (chlorahexidine gluconate) soap before surgery.  CHG is an antiseptic cleaner which kills germs and bonds with the skin to continue killing germs even after washing. Please DO NOT use if you have an allergy to  CHG or antibacterial soaps.  If your skin becomes reddened/irritated stop using the CHG and inform your nurse when you arrive at Short Stay. Do not shave (including legs and underarms) for at least 48 hours prior to the first CHG shower.  You may shave your face/neck. Please follow these instructions carefully:  1.  Shower with CHG Soap the night before surgery and the  morning of Surgery.  2.  If you choose to wash your hair, wash your hair first as usual with your  normal  shampoo.  3.  After you shampoo, rinse your hair and body thoroughly to remove the  shampoo.                            4.  Use CHG as you would any other liquid soap.  You can apply chg directly  to the skin and wash                      Gently with a scrungie or clean washcloth.  5.  Apply the CHG Soap to your body ONLY FROM THE NECK DOWN.   Do not use on face/ open                           Wound or open sores. Avoid contact with eyes, ears mouth and genitals (private parts).                       Wash face,  Genitals (private parts) with your normal soap.             6.  Wash thoroughly, paying special attention to the area where your surgery  will be performed.  7.  Thoroughly rinse your body with warm water from the neck down.  8.  DO NOT shower/wash with your normal soap after using and rinsing off  the CHG Soap.                9.  Pat yourself dry with a clean towel.            10.  Wear clean pajamas.            11.  Place clean sheets on your bed the night of your first shower and do not  sleep with pets. Day of Surgery : Do not apply any lotions/deodorants the morning of surgery.  Please wear clean clothes to the hospital/surgery center.  IF YOU HAVE ANY SKIN IRRITATION OR PROBLEMS WITH THE SURGICAL SOAP, PLEASE GET A BAR OF GOLD DIAL SOAP AND SHOWER THE NIGHT BEFORE YOUR SURGERY AND THE MORNING OF YOUR SURGERY. PLEASE LET THE NURSE KNOW MORNING OF YOUR SURGERY IF YOU HAD ANY PROBLEMS WITH THE SURGICAL  SOAP.   ________________________________________________________________________  QUESTIONS CALL Gloriann Riede PRE OP NURSE PHONE [phone].

## 2021-07-17 NOTE — Progress Notes (Signed)
On the pre-op phone call with patient, I confirmed with her that she refuses blood products. I sent a message to Dr. Sandford Craze to remind her since a type & screen had been ordered.

## 2021-07-25 ENCOUNTER — Other Ambulatory Visit: Payer: Self-pay

## 2021-07-25 ENCOUNTER — Encounter (HOSPITAL_COMMUNITY)
Admission: RE | Admit: 2021-07-25 | Discharge: 2021-07-25 | Disposition: A | Discharge status: Home / Self Care | Payer: Medicaid Other | Source: Ambulatory Visit | Attending: Obstetrics and Gynecology | Admitting: Obstetrics and Gynecology

## 2021-07-25 DIAGNOSIS — Z01818 Encounter for other preprocedural examination: Secondary | ICD-10-CM | POA: Diagnosis not present

## 2021-07-25 DIAGNOSIS — N939 Abnormal uterine and vaginal bleeding, unspecified: Secondary | ICD-10-CM

## 2021-07-25 LAB — CBC
HCT: 39 % (ref 36.0–46.0)
Hemoglobin: 13.1 g/dL (ref 12.0–15.0)
MCH: 28.3 pg (ref 26.0–34.0)
MCHC: 33.6 g/dL (ref 30.0–36.0)
MCV: 84.2 fL (ref 80.0–100.0)
Platelets: 365 10*3/uL (ref 150–400)
RBC: 4.63 MIL/uL (ref 3.87–5.11)
RDW: 13.5 % (ref 11.5–15.5)
WBC: 12.3 10*3/uL — ABNORMAL HIGH (ref 4.0–10.5)
nRBC: 0 % (ref 0.0–0.2)

## 2021-07-25 LAB — COMPREHENSIVE METABOLIC PANEL
ALT: 13 U/L (ref 0–44)
AST: 16 U/L (ref 15–41)
Albumin: 3.6 g/dL (ref 3.5–5.0)
Alkaline Phosphatase: 58 U/L (ref 38–126)
Anion gap: 7 (ref 5–15)
BUN: 10 mg/dL (ref 6–20)
CO2: 25 mmol/L (ref 22–32)
Calcium: 8.8 mg/dL — ABNORMAL LOW (ref 8.9–10.3)
Chloride: 106 mmol/L (ref 98–111)
Creatinine, Ser: 0.49 mg/dL (ref 0.44–1.00)
GFR, Estimated: >60 mL/min (ref >60)
Glucose, Bld: 108 mg/dL — ABNORMAL HIGH (ref 70–99)
Potassium: 2.8 mmol/L — ABNORMAL LOW (ref 3.5–5.1)
Sodium: 138 mmol/L (ref 135–145)
Total Bilirubin: 0.3 mg/dL (ref 0.3–1.2)
Total Protein: 7.4 g/dL (ref 6.5–8.1)

## 2021-07-25 NOTE — Progress Notes (Addendum)
Left voice mail message with Jarrett Soho at dr bovard office pt potasium at pre op labs to day was 2.8 and to please call me with any follow up done by dr bovard.  Addendum medical clearance [facility] received and placed on pt chart for 07-29-2021 surgery

## 2021-07-26 NOTE — Progress Notes (Addendum)
Addendum: pt needs I stat day of surgery  Addendum: spoke with tiffany at dr bovard office dr bovard called in potassium 20 meq daily x 5 days and repeat potassium day of surgery 07-29-2021 per dr Melba Coon.   Spoke with [place] surgery scheduler at 1130 am and was told dr bovard had called in potassium for pt, left message at dr bovard office for tiffany dr bovard assistant to call me back and let me know potassium dosage called in and does dr bovard want a repeat potassium done day of surgery?

## 2021-07-26 NOTE — Progress Notes (Signed)
Called dr Melba Coon office and told [facility] surgery scheduler is working today, Chief Financial Officer for [employer] potassium 2.8 with pre op labs yesterday please call me at [phone] with any actions taken by dr bovard.

## 2021-07-27 NOTE — H&P (Signed)
[name] is an 36 y.o. female [phone] with AUB and pelvic pain.  She has had an open myomectomy for uterine fibroids, but continues to have pain and AUB. Bleeding not controlled with IUD.  Desires definitive management with LAVH/BS(removal of tubal remnants)/cysto.  She is aware of possible need to convert to open hysterectomy.  Has had hernia repair w mesh (above umbilicus).  D/W pt r/b/a of surgery, also process and expectations.  D/W pt surgery made more complicated by her size.    Pertinent Gynecological History: G3P3 LTCS x 3 8-9# babies.   H/o abn pap, last 2021 WNL HR HPV neg H/o herpes H/o BTL  Menstrual History: Patient's last menstrual period was 07/01/2021 (exact date).    Past Medical History:  Diagnosis Date   Abnormal Pap smear    colposcopy, mild dysplasia, HPV   Acid reflux    Anemia    Anxiety    on meds- stable   Bipolar disorder (HCC)    Bronchitis    Chlamydia    COVID-19 04/2020   sore throat, fever, SOB, runny nose, headache. Pt was hospitalized due to low oxygen levels.   Depression    on meds for bipolar   Fibroid    Gonorrhea    History of PID    Hypertension    Influenza A 05/07/2021   Menorrhagia    Obesity    Panic attack    Pneumonia    05/2020   Pre-diabetes    Pregnancy induced hypertension    PTSD (post-traumatic stress disorder)    Sleep apnea    Patient had a sleep study on 07/10/19 that revealed moderate sleep apnea. Per 07/17/21 phone call with patient, she wears a CPAP nightly.   Trichomonas    Urinary tract infection    Uterine leiomyoma 05/01/2020   Wears glasses     Past Surgical History:  Procedure Laterality Date   ABDOMINAL SURGERY     CESAREAN SECTION  05/02/03,05/04/08,10/23/10   x3   CHOLECYSTECTOMY N/A 10/07/2012   Procedure: LAPAROSCOPIC CHOLECYSTECTOMY WITH INTRAOPERATIVE CHOLANGIOGRAM;  Surgeon: Imogene Burn. Georgette Dover, MD;  Location: [facility];  Service: General;  Laterality: N/A;   ESOPHAGOGASTRODUODENOSCOPY (EGD)  WITH PROPOFOL N/A 10/08/2018   Procedure: ESOPHAGOGASTRODUODENOSCOPY (EGD) WITH PROPOFOL;  Surgeon: Carol Ada, MD;  Location: [facility];  Service: Endoscopy;  Laterality: N/A;   FOOT SURGERY  1995   INSERTION OF MESH N/A 12/02/2012   Procedure: INSERTION OF MESH;  Surgeon: Adin Hector, MD;  Location: [facility];  Service: General;  Laterality: N/A;   MYOMECTOMY N/A 08/06/2020   Procedure: ABDOMINAL MYOMECTOMY;  Surgeon: Janyth Contes, MD;  Location: [place];  Service: Gynecology;  Laterality: N/A;  TAP BLOCK and general   TUBAL LIGATION  6979   UMBILICAL HERNIA REPAIR N/A 12/02/2012   Procedure: LAPAROSCOPIC UMBILICAL HERNIA;  Surgeon: Adin Hector, MD;  Location: [facility];  Service: General;  Laterality: N/A;   WISDOM TOOTH EXTRACTION  2001    Family History  Problem Relation Age of Onset   Hypertension Mother    Heart disease Father    Stroke Father    Other Neg Hx   Ovarian cancer  Social History:  reports that she quit smoking about 9 years ago. Her smoking use included cigarettes. She has a 0.25 pack-year smoking history. She has never used smokeless tobacco. She reports that she does not drink alcohol and does not use drugs. single  Allergies:  Allergies  Allergen Reactions  Percocet [Oxycodone-Acetaminophen] Hives and Rash  (Vicodin OK) Vit D, hydrocortisone, prilosec, ibuprofen, Kdau, valtrex prn, ventolin B12 (pt has CPAP machine)  Review of Systems  HENT: Negative.    Respiratory: Negative.    Cardiovascular: Negative.   Gastrointestinal: Negative.   Genitourinary:  Positive for menstrual problem and pelvic pain.  Musculoskeletal: Negative.   Skin: Negative.   Neurological: Negative.   Psychiatric/Behavioral: Negative.     Height 5\' 5"  (1.651 m), weight (!) 140.6 kg, last menstrual period 07/01/2021. Physical Exam Constitutional:      Appearance: She is obese.  HENT:     Head: Normocephalic and atraumatic.  Cardiovascular:     Rate and Rhythm:  Normal rate and regular rhythm.  Pulmonary:     Effort: Pulmonary effort is normal.     Breath sounds: Normal breath sounds.  Abdominal:     General: Bowel sounds are normal.     Palpations: Abdomen is soft.  Genitourinary:    General: Normal vulva.     Rectum: Normal.  Musculoskeletal:        General: Normal range of motion.     Cervical back: Normal range of motion and neck supple.  Skin:    General: Skin is warm and dry.  Neurological:     General: No focal deficit present.     Mental Status: She is alert and oriented to person, place, and time.  Psychiatric:        Mood and Affect: Mood normal.        Behavior: Behavior normal.   Preop Hgb 13.1 [place] IUD at fundus, nl anteverted uterus 6.9 x 8.4 x 9.7cm, ? Adenomyosis.  S/p myomectomy, isthmocoele, nl ovaries  Assessment/Plan: 36yo G3P3 with AUB and pelvic pain for LAVH/BS/cysto D/w pt r/b/a, process and expectations Ancef for prophylaxis   Jamien Casanova Bovard-Stuckert 07/27/2021, 5:29 PM

## 2021-07-29 ENCOUNTER — Other Ambulatory Visit: Payer: Self-pay

## 2021-07-29 ENCOUNTER — Observation Stay (HOSPITAL_COMMUNITY): Payer: Medicaid Other

## 2021-07-29 ENCOUNTER — Encounter (HOSPITAL_BASED_OUTPATIENT_CLINIC_OR_DEPARTMENT_OTHER): Payer: Self-pay | Admitting: Obstetrics and Gynecology

## 2021-07-29 ENCOUNTER — Observation Stay (HOSPITAL_BASED_OUTPATIENT_CLINIC_OR_DEPARTMENT_OTHER): Payer: Medicaid Other | Admitting: Anesthesiology

## 2021-07-29 ENCOUNTER — Encounter (HOSPITAL_BASED_OUTPATIENT_CLINIC_OR_DEPARTMENT_OTHER)
Admission: RE | Disposition: A | Discharge status: Home / Self Care | Payer: Self-pay | Source: Home / Self Care | Attending: Obstetrics and Gynecology

## 2021-07-29 ENCOUNTER — Inpatient Hospital Stay (HOSPITAL_BASED_OUTPATIENT_CLINIC_OR_DEPARTMENT_OTHER)
Admission: RE | Admit: 2021-07-29 | Discharge: 2021-07-30 | DRG: 742 | Disposition: A | Discharge status: Home / Self Care | Payer: Medicaid Other | Attending: Obstetrics and Gynecology | Admitting: Obstetrics and Gynecology

## 2021-07-29 DIAGNOSIS — Z01818 Encounter for other preprocedural examination: Secondary | ICD-10-CM

## 2021-07-29 DIAGNOSIS — N939 Abnormal uterine and vaginal bleeding, unspecified: Secondary | ICD-10-CM | POA: Insufficient documentation

## 2021-07-29 DIAGNOSIS — Z8616 Personal history of COVID-19: Secondary | ICD-10-CM | POA: Diagnosis not present

## 2021-07-29 DIAGNOSIS — F419 Anxiety disorder, unspecified: Secondary | ICD-10-CM | POA: Diagnosis present

## 2021-07-29 DIAGNOSIS — G4730 Sleep apnea, unspecified: Secondary | ICD-10-CM | POA: Diagnosis present

## 2021-07-29 DIAGNOSIS — Z8249 Family history of ischemic heart disease and other diseases of the circulatory system: Secondary | ICD-10-CM | POA: Diagnosis not present

## 2021-07-29 DIAGNOSIS — F319 Bipolar disorder, unspecified: Secondary | ICD-10-CM | POA: Diagnosis present

## 2021-07-29 DIAGNOSIS — F4310 Post-traumatic stress disorder, unspecified: Secondary | ICD-10-CM | POA: Diagnosis present

## 2021-07-29 DIAGNOSIS — I10 Essential (primary) hypertension: Secondary | ICD-10-CM

## 2021-07-29 DIAGNOSIS — Z6841 Body Mass Index (BMI) 40.0 and over, adult: Secondary | ICD-10-CM | POA: Diagnosis not present

## 2021-07-29 DIAGNOSIS — Z823 Family history of stroke: Secondary | ICD-10-CM | POA: Diagnosis not present

## 2021-07-29 DIAGNOSIS — N920 Excessive and frequent menstruation with regular cycle: Principal | ICD-10-CM | POA: Diagnosis present

## 2021-07-29 DIAGNOSIS — Z87891 Personal history of nicotine dependence: Secondary | ICD-10-CM | POA: Diagnosis not present

## 2021-07-29 DIAGNOSIS — Z90710 Acquired absence of both cervix and uterus: Secondary | ICD-10-CM

## 2021-07-29 DIAGNOSIS — E669 Obesity, unspecified: Secondary | ICD-10-CM | POA: Diagnosis present

## 2021-07-29 DIAGNOSIS — Z885 Allergy status to narcotic agent status: Secondary | ICD-10-CM | POA: Diagnosis not present

## 2021-07-29 DIAGNOSIS — K219 Gastro-esophageal reflux disease without esophagitis: Secondary | ICD-10-CM | POA: Diagnosis not present

## 2021-07-29 DIAGNOSIS — N736 Female pelvic peritoneal adhesions (postinfective): Secondary | ICD-10-CM | POA: Diagnosis present

## 2021-07-29 DIAGNOSIS — Z90711 Acquired absence of uterus with remaining cervical stump: Secondary | ICD-10-CM

## 2021-07-29 HISTORY — DX: Prediabetes: R73.03

## 2021-07-29 HISTORY — DX: Presence of spectacles and contact lenses: Z97.3

## 2021-07-29 HISTORY — PX: CYSTOSCOPY: SHX5120

## 2021-07-29 HISTORY — DX: Post-traumatic stress disorder, unspecified: F43.10

## 2021-07-29 HISTORY — PX: ABDOMINAL HYSTERECTOMY: SHX81

## 2021-07-29 HISTORY — PX: SUPRACERVICAL ABDOMINAL HYSTERECTOMY: SHX5393

## 2021-07-29 HISTORY — DX: Bipolar disorder, unspecified: F31.9

## 2021-07-29 HISTORY — DX: Acquired absence of both cervix and uterus: Z90.710

## 2021-07-29 HISTORY — PX: LAPAROSCOPIC VAGINAL HYSTERECTOMY WITH SALPINGECTOMY: SHX6680

## 2021-07-29 LAB — POCT I-STAT, CHEM 8
BUN: 7 mg/dL (ref 6–20)
Calcium, Ion: 1.2 mmol/L (ref 1.15–1.40)
Chloride: 102 mmol/L (ref 98–111)
Creatinine, Ser: 0.7 mg/dL (ref 0.44–1.00)
Glucose, Bld: 98 mg/dL (ref 70–99)
HCT: 38 % (ref 36.0–46.0)
Hemoglobin: 12.9 g/dL (ref 12.0–15.0)
Potassium: 2.9 mmol/L — ABNORMAL LOW (ref 3.5–5.1)
Sodium: 141 mmol/L (ref 135–145)
TCO2: 25 mmol/L (ref 22–32)

## 2021-07-29 LAB — NO BLOOD PRODUCTS

## 2021-07-29 LAB — POCT PREGNANCY, URINE: Preg Test, Ur: NEGATIVE

## 2021-07-29 SURGERY — HYSTERECTOMY, VAGINAL, LAPAROSCOPY-ASSISTED, WITH SALPINGECTOMY
Anesthesia: General | Site: Bladder

## 2021-07-29 MED ORDER — DEXMEDETOMIDINE (PRECEDEX) IN NS 20 MCG/5ML (4 MCG/ML) IV SYRINGE
PREFILLED_SYRINGE | INTRAVENOUS | Status: DC | PRN
  Administered 2021-07-29: 12 ug via INTRAVENOUS
  Administered 2021-07-29: 8 ug via INTRAVENOUS

## 2021-07-29 MED ORDER — ONDANSETRON HCL 4 MG/2ML IJ SOLN: 4.0000 mg | Freq: Four times a day (QID) | INTRAMUSCULAR | Status: DC | PRN

## 2021-07-29 MED ORDER — PHENYLEPHRINE HCL (PRESSORS) 10 MG/ML IV SOLN
INTRAVENOUS | Status: AC
  Filled 2021-07-29: qty 1

## 2021-07-29 MED ORDER — LIDOCAINE HCL (CARDIAC) PF 100 MG/5ML IV SOSY
PREFILLED_SYRINGE | INTRAVENOUS | Status: DC | PRN
  Administered 2021-07-29: 60 mg via INTRAVENOUS

## 2021-07-29 MED ORDER — ROCURONIUM BROMIDE 10 MG/ML (PF) SYRINGE
PREFILLED_SYRINGE | INTRAVENOUS | Status: DC | PRN
  Administered 2021-07-29: 70 mg via INTRAVENOUS
  Administered 2021-07-29: 30 mg via INTRAVENOUS
  Administered 2021-07-29 (×2): 20 mg via INTRAVENOUS

## 2021-07-29 MED ORDER — PANTOPRAZOLE SODIUM 40 MG PO TBEC
40.0000 mg | DELAYED_RELEASE_TABLET | Freq: Every day | ORAL | Status: DC
  Administered 2021-07-29: 40 mg via ORAL

## 2021-07-29 MED ORDER — MIDAZOLAM HCL 2 MG/2ML IJ SOLN
INTRAMUSCULAR | Status: AC
  Filled 2021-07-29: qty 2

## 2021-07-29 MED ORDER — ALBUTEROL SULFATE HFA 108 (90 BASE) MCG/ACT IN AERS: 1.0000 | INHALATION_SPRAY | Freq: Four times a day (QID) | RESPIRATORY_TRACT | Status: DC | PRN

## 2021-07-29 MED ORDER — SCOPOLAMINE 1 MG/3DAYS TD PT72
1.0000 | MEDICATED_PATCH | TRANSDERMAL | Status: DC
  Administered 2021-07-29: 1.5 mg via TRANSDERMAL

## 2021-07-29 MED ORDER — ACETAMINOPHEN 500 MG PO TABS
1000.0000 mg | ORAL_TABLET | ORAL | Status: AC
  Administered 2021-07-29: 1000 mg via ORAL

## 2021-07-29 MED ORDER — LACTATED RINGERS IV SOLN: INTRAVENOUS | Status: DC

## 2021-07-29 MED ORDER — SODIUM CHLORIDE 0.9 % IR SOLN
Status: DC | PRN
  Administered 2021-07-29: 1000 mL via INTRAVESICAL

## 2021-07-29 MED ORDER — ONDANSETRON HCL 4 MG/2ML IJ SOLN
INTRAMUSCULAR | Status: DC | PRN
  Administered 2021-07-29: 4 mg via INTRAVENOUS

## 2021-07-29 MED ORDER — HYDROCODONE-ACETAMINOPHEN 5-325 MG PO TABS
1.0000 | ORAL_TABLET | Freq: Four times a day (QID) | ORAL | Status: DC | PRN
  Administered 2021-07-30 (×2): 2 via ORAL

## 2021-07-29 MED ORDER — MIDAZOLAM HCL 5 MG/5ML IJ SOLN
INTRAMUSCULAR | Status: DC | PRN
  Administered 2021-07-29: 2 mg via INTRAVENOUS

## 2021-07-29 MED ORDER — DIPHENHYDRAMINE HCL 50 MG/ML IJ SOLN: 12.5000 mg | Freq: Four times a day (QID) | INTRAMUSCULAR | Status: DC | PRN

## 2021-07-29 MED ORDER — DEXAMETHASONE SODIUM PHOSPHATE 4 MG/ML IJ SOLN
INTRAMUSCULAR | Status: DC | PRN
  Administered 2021-07-29: 10 mg via INTRAVENOUS

## 2021-07-29 MED ORDER — ALUM & MAG HYDROXIDE-SIMETH 200-200-20 MG/5ML PO SUSP: 30.0000 mL | ORAL | Status: DC | PRN

## 2021-07-29 MED ORDER — PHENYLEPHRINE HCL-NACL 20-0.9 MG/250ML-% IV SOLN
INTRAVENOUS | Status: DC | PRN
  Administered 2021-07-29: 50 ug/min via INTRAVENOUS

## 2021-07-29 MED ORDER — DEXAMETHASONE SODIUM PHOSPHATE 10 MG/ML IJ SOLN
INTRAMUSCULAR | Status: AC
  Filled 2021-07-29: qty 1

## 2021-07-29 MED ORDER — FLUORESCEIN SODIUM 10 % IV SOLN
INTRAVENOUS | Status: DC | PRN
  Administered 2021-07-29 (×2): 1 mL via INTRAVENOUS

## 2021-07-29 MED ORDER — ACETAMINOPHEN 325 MG PO TABS: 325.0000 mg | ORAL_TABLET | ORAL | Status: DC | PRN

## 2021-07-29 MED ORDER — POVIDONE-IODINE 10 % EX SWAB: 2.0000 "application " | Freq: Once | CUTANEOUS | Status: DC

## 2021-07-29 MED ORDER — PHENYLEPHRINE 40 MCG/ML (10ML) SYRINGE FOR IV PUSH (FOR BLOOD PRESSURE SUPPORT)
PREFILLED_SYRINGE | INTRAVENOUS | Status: DC | PRN
  Administered 2021-07-29 (×2): 80 ug via INTRAVENOUS
  Administered 2021-07-29: 40 ug via INTRAVENOUS
  Administered 2021-07-29: 120 ug via INTRAVENOUS
  Administered 2021-07-29: 80 ug via INTRAVENOUS
  Administered 2021-07-29: 120 ug via INTRAVENOUS
  Administered 2021-07-29 (×3): 80 ug via INTRAVENOUS

## 2021-07-29 MED ORDER — PHENYLEPHRINE 40 MCG/ML (10ML) SYRINGE FOR IV PUSH (FOR BLOOD PRESSURE SUPPORT)
PREFILLED_SYRINGE | INTRAVENOUS | Status: AC
  Filled 2021-07-29: qty 20

## 2021-07-29 MED ORDER — SIMETHICONE 80 MG PO CHEW: 80.0000 mg | CHEWABLE_TABLET | Freq: Four times a day (QID) | ORAL | Status: DC | PRN

## 2021-07-29 MED ORDER — GUAIFENESIN 100 MG/5ML PO LIQD
15.0000 mL | ORAL | Status: DC | PRN
  Filled 2021-07-29: qty 15

## 2021-07-29 MED ORDER — SUGAMMADEX SODIUM 200 MG/2ML IV SOLN
INTRAVENOUS | Status: DC | PRN
  Administered 2021-07-29: 300 mg via INTRAVENOUS

## 2021-07-29 MED ORDER — IBUPROFEN 800 MG PO TABS: 800.0000 mg | ORAL_TABLET | Freq: Three times a day (TID) | ORAL | Status: DC | PRN

## 2021-07-29 MED ORDER — CEFAZOLIN IN SODIUM CHLORIDE 3-0.9 GM/100ML-% IV SOLN
INTRAVENOUS | Status: AC
  Filled 2021-07-29: qty 100

## 2021-07-29 MED ORDER — BUPIVACAINE HCL (PF) 0.25 % IJ SOLN
INTRAMUSCULAR | Status: DC | PRN
  Administered 2021-07-29: 5 mL

## 2021-07-29 MED ORDER — FLUORESCEIN SODIUM 10 % IV SOLN
INTRAVENOUS | Status: AC
  Filled 2021-07-29: qty 5

## 2021-07-29 MED ORDER — ROCURONIUM BROMIDE 10 MG/ML (PF) SYRINGE
PREFILLED_SYRINGE | INTRAVENOUS | Status: AC
  Filled 2021-07-29: qty 10

## 2021-07-29 MED ORDER — MENTHOL 3 MG MT LOZG: 1.0000 | LOZENGE | OROMUCOSAL | Status: DC | PRN

## 2021-07-29 MED ORDER — HYDROMORPHONE HCL 1 MG/ML IJ SOLN: 0.2000 mg | INTRAMUSCULAR | Status: DC | PRN

## 2021-07-29 MED ORDER — CEFAZOLIN IN SODIUM CHLORIDE 3-0.9 GM/100ML-% IV SOLN
3.0000 g | INTRAVENOUS | Status: AC
  Administered 2021-07-29: 3 g via INTRAVENOUS

## 2021-07-29 MED ORDER — SODIUM CHLORIDE 0.9% FLUSH: 9.0000 mL | INTRAVENOUS | Status: DC | PRN

## 2021-07-29 MED ORDER — HYDROMORPHONE 1 MG/ML IV SOLN
INTRAVENOUS | Status: DC
  Administered 2021-07-30: 0.2 mg via INTRAVENOUS
  Administered 2021-07-30: 1.6 mg via INTRAVENOUS
  Filled 2021-07-29: qty 30

## 2021-07-29 MED ORDER — ACETAMINOPHEN 160 MG/5ML PO SOLN: 325.0000 mg | ORAL | Status: DC | PRN

## 2021-07-29 MED ORDER — LIDOCAINE HCL (PF) 2 % IJ SOLN
INTRAMUSCULAR | Status: AC
  Filled 2021-07-29: qty 5

## 2021-07-29 MED ORDER — ACETAMINOPHEN 500 MG PO TABS
ORAL_TABLET | ORAL | Status: AC
  Filled 2021-07-29: qty 2

## 2021-07-29 MED ORDER — PROPOFOL 10 MG/ML IV BOLUS
INTRAVENOUS | Status: DC | PRN
  Administered 2021-07-29: 170 mg via INTRAVENOUS

## 2021-07-29 MED ORDER — SCOPOLAMINE 1 MG/3DAYS TD PT72
MEDICATED_PATCH | TRANSDERMAL | Status: AC
  Filled 2021-07-29: qty 1

## 2021-07-29 MED ORDER — ESMOLOL HCL 100 MG/10ML IV SOLN
INTRAVENOUS | Status: AC
  Filled 2021-07-29: qty 10

## 2021-07-29 MED ORDER — ACETAMINOPHEN 10 MG/ML IV SOLN: 1000.0000 mg | Freq: Once | INTRAVENOUS | Status: DC | PRN

## 2021-07-29 MED ORDER — ONDANSETRON HCL 4 MG/2ML IJ SOLN
INTRAMUSCULAR | Status: AC
  Filled 2021-07-29: qty 2

## 2021-07-29 MED ORDER — HYDROMORPHONE HCL 1 MG/ML IJ SOLN
INTRAMUSCULAR | Status: AC
  Filled 2021-07-29: qty 1

## 2021-07-29 MED ORDER — FENTANYL CITRATE (PF) 100 MCG/2ML IJ SOLN
INTRAMUSCULAR | Status: DC | PRN
  Administered 2021-07-29 (×2): 100 ug via INTRAVENOUS
  Administered 2021-07-29: 50 ug via INTRAVENOUS

## 2021-07-29 MED ORDER — GABAPENTIN 300 MG PO CAPS
ORAL_CAPSULE | ORAL | Status: AC
  Filled 2021-07-29: qty 1

## 2021-07-29 MED ORDER — AMLODIPINE BESYLATE 10 MG PO TABS
10.0000 mg | ORAL_TABLET | Freq: Every day | ORAL | Status: DC
  Filled 2021-07-29 (×2): qty 1

## 2021-07-29 MED ORDER — HYDROMORPHONE HCL 1 MG/ML IJ SOLN
0.2500 mg | INTRAMUSCULAR | Status: DC | PRN
  Administered 2021-07-29: 0.5 mg via INTRAVENOUS

## 2021-07-29 MED ORDER — FENTANYL CITRATE (PF) 100 MCG/2ML IJ SOLN
INTRAMUSCULAR | Status: AC
  Filled 2021-07-29: qty 2

## 2021-07-29 MED ORDER — AMISULPRIDE (ANTIEMETIC) 5 MG/2ML IV SOLN: 10.0000 mg | Freq: Once | INTRAVENOUS | Status: DC | PRN

## 2021-07-29 MED ORDER — GABAPENTIN 300 MG PO CAPS
300.0000 mg | ORAL_CAPSULE | ORAL | Status: AC
  Administered 2021-07-29: 300 mg via ORAL

## 2021-07-29 MED ORDER — ONDANSETRON HCL 4 MG PO TABS: 4.0000 mg | ORAL_TABLET | Freq: Four times a day (QID) | ORAL | Status: DC | PRN

## 2021-07-29 MED ORDER — NALOXONE HCL 0.4 MG/ML IJ SOLN: 0.4000 mg | INTRAMUSCULAR | Status: DC | PRN

## 2021-07-29 MED ORDER — PROMETHAZINE HCL 25 MG/ML IJ SOLN: 6.2500 mg | INTRAMUSCULAR | Status: DC | PRN

## 2021-07-29 MED ORDER — PANTOPRAZOLE SODIUM 40 MG PO TBEC
DELAYED_RELEASE_TABLET | ORAL | Status: AC
  Filled 2021-07-29: qty 1

## 2021-07-29 MED ORDER — FENTANYL CITRATE (PF) 250 MCG/5ML IJ SOLN
INTRAMUSCULAR | Status: AC
  Filled 2021-07-29: qty 5

## 2021-07-29 MED ORDER — 0.9 % SODIUM CHLORIDE (POUR BTL) OPTIME
TOPICAL | Status: DC | PRN
  Administered 2021-07-29: 1000 mL
  Administered 2021-07-29: 2000 mL
  Administered 2021-07-29: 500 mL

## 2021-07-29 MED ORDER — DIPHENHYDRAMINE HCL 12.5 MG/5ML PO ELIX: 12.5000 mg | ORAL_SOLUTION | Freq: Four times a day (QID) | ORAL | Status: DC | PRN

## 2021-07-29 SURGICAL SUPPLY — 71 items
ADH SKN CLS APL DERMABOND .7 (GAUZE/BANDAGES/DRESSINGS) ×4
APL SKNCLS STERI-STRIP NONHPOA (GAUZE/BANDAGES/DRESSINGS) ×4
APL SRG 38 LTWT LNG FL B (MISCELLANEOUS)
APPLICATOR ARISTA FLEXITIP XL (MISCELLANEOUS) IMPLANT
BAG DECANTER FOR FLEXI CONT (MISCELLANEOUS) ×1 IMPLANT
BARRIER ADHS 3X4 INTERCEED (GAUZE/BANDAGES/DRESSINGS) ×1 IMPLANT
BENZOIN TINCTURE PRP APPL 2/3 (GAUZE/BANDAGES/DRESSINGS) ×1 IMPLANT
BRR ADH 4X3 ABS CNTRL BYND (GAUZE/BANDAGES/DRESSINGS) ×4
CABLE HIGH FREQUENCY MONO STRZ (ELECTRODE) IMPLANT
CATH URET 5FR 28IN OPEN ENDED (CATHETERS) ×1 IMPLANT
CNTNR URN SCR LID CUP LEK RST (MISCELLANEOUS) ×4 IMPLANT
CONT SPEC 4OZ STRL OR WHT (MISCELLANEOUS) ×5
COVER BACK TABLE 60X90IN (DRAPES) ×5 IMPLANT
COVER MAYO STAND STRL (DRAPES) ×10 IMPLANT
DECANTER SPIKE VIAL GLASS SM (MISCELLANEOUS) IMPLANT
DERMABOND ADVANCED (GAUZE/BANDAGES/DRESSINGS) ×1
DERMABOND ADVANCED .7 DNX12 (GAUZE/BANDAGES/DRESSINGS) ×4 IMPLANT
DRAPE C-ARM 42X120 X-RAY (DRAPES) ×1 IMPLANT
DRSG COVADERM PLUS 2X2 (GAUZE/BANDAGES/DRESSINGS) IMPLANT
DRSG OPSITE POSTOP 3X4 (GAUZE/BANDAGES/DRESSINGS) IMPLANT
DRSG OPSITE POSTOP 4X10 (GAUZE/BANDAGES/DRESSINGS) ×1 IMPLANT
DURAPREP 26ML APPLICATOR (WOUND CARE) ×5 IMPLANT
ELECT REM PT RETURN 9FT ADLT (ELECTROSURGICAL) ×5
ELECTRODE REM PT RTRN 9FT ADLT (ELECTROSURGICAL) ×4 IMPLANT
GAUZE 4X4 16PLY ~~LOC~~+RFID DBL (SPONGE) ×15 IMPLANT
GLOVE SURG ENC MOIS LTX SZ6.5 (GLOVE) ×15 IMPLANT
GLOVE SURG ENC TEXT LTX SZ6.5 (GLOVE) ×4 IMPLANT
GLOVE SURG UNDER POLY LF SZ7.5 (GLOVE) ×2 IMPLANT
HEMOSTAT ARISTA ABSORB 3G PWDR (HEMOSTASIS) IMPLANT
HIBICLENS CHG 4% 4OZ BTL (MISCELLANEOUS) ×5 IMPLANT
KIT TURNOVER CYSTO (KITS) ×5 IMPLANT
NEEDLE INSUFFLATION 120MM (ENDOMECHANICALS) ×5 IMPLANT
NS IRRIG 1000ML POUR BTL (IV SOLUTION) ×7 IMPLANT
NS IRRIG 500ML POUR BTL (IV SOLUTION) ×1 IMPLANT
PACK LAVH (CUSTOM PROCEDURE TRAY) ×5 IMPLANT
PACK ROBOTIC GOWN (GOWN DISPOSABLE) ×5 IMPLANT
PACK TRENDGUARD 450 HYBRID PRO (MISCELLANEOUS) ×4 IMPLANT
PAD OB MATERNITY 4.3X12.25 (PERSONAL CARE ITEMS) ×5 IMPLANT
PROTECTOR NERVE ULNAR (MISCELLANEOUS) ×10 IMPLANT
RETAINER VISCERAL (MISCELLANEOUS) ×1 IMPLANT
RTRCTR C-SECT PINK 25CM LRG (MISCELLANEOUS) ×1 IMPLANT
SET IRRIG Y TYPE TUR BLADDER L (SET/KITS/TRAYS/PACK) ×5 IMPLANT
SET SUCTION IRRIG HYDROSURG (IRRIGATION / IRRIGATOR) ×5 IMPLANT
SET TUBE SMOKE EVAC HIGH FLOW (TUBING) ×5 IMPLANT
SHEARS HARMONIC ACE PLUS 36CM (ENDOMECHANICALS) ×5 IMPLANT
SPONGE T-LAP 18X18 ~~LOC~~+RFID (SPONGE) ×3 IMPLANT
SPONGE T-LAP 4X18 ~~LOC~~+RFID (SPONGE) ×5 IMPLANT
STRIP CLOSURE SKIN 1/2X4 (GAUZE/BANDAGES/DRESSINGS) ×1 IMPLANT
SUT PDS AB 0 CTX 60 (SUTURE) ×1 IMPLANT
SUT PLAIN 2 0 XLH (SUTURE) ×1 IMPLANT
SUT VIC AB 0 CT1 18XCR BRD 8 (SUTURE) IMPLANT
SUT VIC AB 0 CT1 8-18 (SUTURE) ×15
SUT VIC AB 1 CT1 18XBRD ANBCTR (SUTURE) ×8 IMPLANT
SUT VIC AB 1 CT1 8-18 (SUTURE) ×10
SUT VIC AB 2-0 CT1 (SUTURE) ×5 IMPLANT
SUT VIC AB 3-0 SH 27 (SUTURE)
SUT VIC AB 3-0 SH 27X BRD (SUTURE) IMPLANT
SUT VIC AB 4-0 KS 27 (SUTURE) ×3 IMPLANT
SUT VIC AB 4-0 PS2 27 (SUTURE) ×5 IMPLANT
SUT VICRYL 0 TIES 12 18 (SUTURE) ×5 IMPLANT
SUT VICRYL 0 UR6 27IN ABS (SUTURE) ×1 IMPLANT
SYR 3ML 23GX1 SAFETY (SYRINGE) ×1 IMPLANT
TOWEL OR 17X26 10 PK STRL BLUE (TOWEL DISPOSABLE) ×5 IMPLANT
TRAY FOLEY W/BAG SLVR 14FR LF (SET/KITS/TRAYS/PACK) ×5 IMPLANT
TRENDGUARD 450 HYBRID PRO PACK (MISCELLANEOUS) ×5
TROCAR 5M 150ML BLDLS (TROCAR) ×1 IMPLANT
TROCAR BALLN 12MMX100 BLUNT (TROCAR) ×1 IMPLANT
TROCAR BLADELESS OPT 5 100 (ENDOMECHANICALS) ×15 IMPLANT
TUBE CONNECTING 12X1/4 (SUCTIONS) ×1 IMPLANT
WARMER LAPAROSCOPE (MISCELLANEOUS) ×5 IMPLANT
YANKAUER SUCT BULB TIP NO VENT (SUCTIONS) ×1 IMPLANT

## 2021-07-29 NOTE — Anesthesia Postprocedure Evaluation (Signed)
Anesthesia Post Note  Patient: [name]  Procedure(s) Performed: ATTEMPTED LAPAROSCOPIC ASSISTED VAGINAL HYSTERECTOMY WITH SALPINGECTOMY (Bilateral: Abdomen) CYSTOSCOPY HYSTERECTOMY ABDOMINAL (Abdomen)     Patient location during evaluation: PACU Anesthesia Type: General Level of consciousness: awake and alert Pain management: pain level controlled Vital Signs Assessment: post-procedure vital signs reviewed and stable Respiratory status: spontaneous breathing, nonlabored ventilation, respiratory function stable and patient connected to nasal cannula oxygen Cardiovascular status: blood pressure returned to baseline and stable Postop Assessment: no apparent nausea or vomiting Anesthetic complications: no Comments: [name] declined bilateral TAP block in PACU.    No notable events documented.  Last Vitals:  Vitals:   07/29/21 1300 07/29/21 1318  BP:  124/81  Pulse:    Resp:  16  Temp: 36.9 C 36.8 C  SpO2:  98%    Last Pain:  Vitals:   07/29/21 1245  TempSrc:   PainSc: [name]

## 2021-07-29 NOTE — Transfer of Care (Signed)
Immediate Anesthesia Transfer of Care Note  Patient: [name]  Procedure(s) Performed: ATTEMPTED LAPAROSCOPIC ASSISTED VAGINAL HYSTERECTOMY WITH SALPINGECTOMY (Bilateral: Abdomen) CYSTOSCOPY HYSTERECTOMY ABDOMINAL (Abdomen)  Patient Location: PACU  Anesthesia Type:General  Level of Consciousness: awake  Airway & Oxygen Therapy: Patient Spontanous Breathing and Patient connected to face mask oxygen  Post-op Assessment: Report given to RN and Post -op Vital signs reviewed and stable  Post vital signs: Reviewed and stable  Last Vitals:  Vitals Value Taken Time  BP 133/70 07/29/21 1145  Temp    Pulse 99 07/29/21 1145  Resp 19 07/29/21 1145  SpO2 100 % 07/29/21 1145  Vitals shown include unvalidated device data.  Last Pain:  Vitals:   07/29/21 0616  TempSrc: Oral  PainSc: 0-No pain      Patients Stated Pain Goal: 5 (24/46/95 0722)  Complications: No notable events documented.

## 2021-07-29 NOTE — Anesthesia Preprocedure Evaluation (Addendum)
Anesthesia Evaluation  Patient identified by MRN, date of birth, ID band Patient awake    Reviewed: Allergy & Precautions, NPO status , Patient's Chart, lab work & pertinent test results  Airway Mallampati: II  TM Distance: >3 FB Neck ROM: Full    Dental  (+) Teeth Intact, Dental Advisory Given   Pulmonary sleep apnea , former smoker,    breath sounds clear to auscultation       Cardiovascular hypertension, Pt. on medications  Rhythm:Regular Rate:Normal     Neuro/Psych PSYCHIATRIC DISORDERS Anxiety Depression Bipolar Disorder negative neurological ROS     GI/Hepatic Neg liver ROS, GERD  Medicated,  Endo/Other  negative endocrine ROS  Renal/GU negative Renal ROS     Musculoskeletal negative musculoskeletal ROS (+)   Abdominal Normal abdominal exam  (+)   Peds  Hematology negative hematology ROS (+)   Anesthesia Other Findings   Reproductive/Obstetrics                            Anesthesia Physical Anesthesia Plan  ASA: 3  Anesthesia Plan: General   Post-op Pain Management:    Induction: Intravenous  PONV Risk Score and Plan: 4 or greater and Ondansetron, Dexamethasone, Midazolam and Scopolamine patch - Pre-op  Airway Management Planned: Oral ETT  Additional Equipment: None  Intra-op Plan:   Post-operative Plan: Extubation in OR  Informed Consent: I have reviewed the patients History and Physical, chart, labs and discussed the procedure including the risks, benefits and alternatives for the proposed anesthesia with the patient or authorized representative who has indicated his/her understanding and acceptance.     Dental advisory given  Plan Discussed with: CRNA  Anesthesia Plan Comments: (Lab Results      Component                Value               Date                      WBC                      12.3 (H)            07/25/2021                HGB                       12.9                07/29/2021                HCT                      38.0                07/29/2021                MCV                      84.2                07/25/2021                PLT                      365  07/25/2021            )       Anesthesia Quick Evaluation

## 2021-07-29 NOTE — Op Note (Signed)
NAME: [name], [name] MEDICAL RECORD NO: [id number] ACCOUNT NO: [id number] DATE OF BIRTH: 02/15/1986 FACILITY: [facility] LOCATION: [facility] PHYSICIAN: Janyth Contes, MD  Operative Report   DATE OF PROCEDURE: 07/29/2021  PREOPERATIVE DIAGNOSES:  Menorrhagia, pelvic pain.  POSTOPERATIVE DIAGNOSES:  Menorrhagia, pelvic pain.  PROCEDURE:  Attempted laparoscopic-assisted vaginal hysterectomy with bilateral salpingectomy, cystoscopy, converted to abdominal hysterectomy, bilateral salpingectomy for tubal remnants.cystoscopy  SURGEON:  Janyth Contes, MD  ASSISTANT:  Carlynn Purl, DO. Also consult by Ellison Hughs, M.D., urologist.  ANESTHESIA:  Local and general.  ESTIMATED BLOOD LOSS:  350 mL  IV FLUID AND URINE OUTPUT:  Per Anesthesia.  Clear urine with fluorescein tinging at end of procedure.  COMPLICATIONS:  Unable to successfully place a trocar due to the patient's abdominal girth, therefore, decision to proceed with abdominal hysterectomy.  Also, inability to see fluorescein from bilateral ureteral orifices, called for urology consult was  easily visualized by urologist.  DESCRIPTION OF PROCEDURE:  After informed consent was reviewed with the patient including risks, benefits and alternatives of the surgical procedure, she was transported to the operating room and placed on the table in supine position.  General  anesthesia was induced and found to be adequate.  She was then placed in [place] and prepped and draped in the normal sterile fashion.  After an appropriate timeout was performed using a open-sided speculum, her cervix was easily visualized,  grasped with a single tooth tenaculum and a Hulka manipulator was placed.  Gloves and gown were changed and attention was turned to the abdominal portion of the case.  Approximately 10 mm infraumbilical incision was made and the fascia was attempted to  be lifted for the trocar to be placed at the umbilicus,  inability to get to the level of the fascia.  Decision was made to proceed with direct entry. Again inability to place the trocar confidently, the decision was made to proceed with an abdominal  incision at the level of her previous Pfannenstiel incision. Incision was made, carried through the underlying layer of fascia sharply.  The fascia was incised in the midline.  The midline incision was extended laterally with Mayo scissors.  Superior  aspect of the fascial incision was grasped with Kocher clamps, elevated, and the rectus muscles were dissected off both bluntly and sharply.  Attention was turned to the inferior portion, which in a similar fashion was dissected and the rectus muscles  were identified in the midline.  The midline was entered bluntly and the peritoneum was entered.  The Alexis skin retractor was placed carefully making sure that no bowel was entrapped.  The brief pelvic survey was performed and revealed normal uterus  with some adhesions on the left to the epiploica of the bowel, to the right some adhesions to the adnexal structures.  The ovaries were isolated with the Bovie cautery and the uterus was grasped at the cornu elevated.  The round ligaments were ligated  bilaterally. In a stepwise fashion pedicles were created.  The ovary was separated from the uterus bilaterally as well.  Pedicles were placed and made in a stepwise fashion to the level of the bladder flap.  The bladder flap was created both digitally  and sharply.  Decision was made to proceed with a supracervical hysterectomy given difficulty with visualization.  The cervix was incised and the endocervical canal was treated with Bovie cautery to kill any endometrial tissue that was there.  This was  found to be hemostatic.  Interceed was placed over  the cervical stump.  The pedicles were inspected and found to be hemostatic.  The fascia was closed with looped PDS.  The subcuticular adipose layer was made hemostatic with  Bovie cautery.  Decision was  made to proceed with the cystoscope.  Although fluorescein was injected.  It was not seen.  Ureteral jets were not identified.  Urology was called upon, after waiting for a bit, they came to the operating room.  Dr. Lovena Neighbours arrived and on placing the  cystoscope into the bladder fluorescein tinged urine was seen with bilateral ureteral jets.  The Foley catheter was replaced.  Gloves and gown were changed.  Attention was turned to the abdominal incision.  A running suture of plain gut was used to close  the dead space.  The skin was closed with 4-0 Vicryl in subcuticular fashion.  Benzoin and Steri Strips were applied.  The umbilical incision was hemostatic.  This was also approximated with 4-0 Vicryl in a subcuticular fashion.  Dermabond was applied.   The patient tolerated the procedure well.  Sponge, lap and needle counts were correct x2 per the operating room staff.   PUS D: 07/29/2021 1:21:52 pm T: 07/29/2021 5:33:00 pm  JOB: [id number]/ [id number]

## 2021-07-29 NOTE — Anesthesia Procedure Notes (Signed)
Procedure Name: Intubation Date/Time: 07/29/2021 7:34 AM Performed by: Lieutenant Diego, CRNA Pre-anesthesia Checklist: Patient identified, Emergency Drugs available, Suction available and Patient being monitored Patient Re-evaluated:Patient Re-evaluated prior to induction Oxygen Delivery Method: Circle system utilized Preoxygenation: Pre-oxygenation with 100% oxygen Induction Type: IV induction Ventilation: Mask ventilation without difficulty Laryngoscope Size: Miller and 2 Grade View: Grade II Tube type: Oral Tube size: 7.0 mm Number of attempts: 1 Airway Equipment and Method: Stylet Placement Confirmation: ETT inserted through vocal cords under direct vision, positive ETCO2 and breath sounds checked- equal and bilateral Secured at: 22 cm Tube secured with: Tape Dental Injury: Teeth and Oropharynx as per pre-operative assessment

## 2021-07-29 NOTE — Progress Notes (Signed)
Day of Surgery Procedure(s) (LRB): ATTEMPTED LAPAROSCOPIC ASSISTED VAGINAL HYSTERECTOMY WITH SALPINGECTOMY (Bilateral) CYSTOSCOPY (N/A) HYSTERECTOMY ABDOMINAL (N/A) CYSTOSCOPY  Subjective: Patient reports incisional pain and tolerating PO.  D/W pt abdominal hysterectomy - supracervical (will still need pap smears.  Will have ambulate befpre d/c foley.  Still thinks if pain is manageable will want AM d/c.  D/W pt needs to be ambulating, voiding, tolerating po and pain controlled w oral pain meds.    Objective: I have reviewed patient's vital signs, intake and output, medications, and labs.  General: alert and no distress Resp: clear to auscultation bilaterally Cardio: regular rate and rhythm GI: soft, non-tender; bowel sounds normal; no masses,  no organomegaly Extremities: extremities normal, atraumatic, no cyanosis or edema Inc C/D/I  Assessment: s/p Procedure(s): ATTEMPTED LAPAROSCOPIC ASSISTED VAGINAL HYSTERECTOMY WITH SALPINGECTOMY (Bilateral) CYSTOSCOPY (N/A) HYSTERECTOMY ABDOMINAL (N/A) CYSTOSCOPY: stable and progressing well  Plan: Advance diet Encourage ambulation, if possible d/c foley this pm  Possible d/c home tomorrow, d/c w Vicodin and Motrin F/u 2 and 6 weeks  If not ready for d/c, will transfer to [facility].  LOS: 0 days    [name] 07/29/2021, 4:49 PM

## 2021-07-29 NOTE — Progress Notes (Signed)
Margreta Journey called to give update on pt status: surgery has taken longer than anticipated but everything is goin

## 2021-07-29 NOTE — Op Note (Signed)
Operative Note  Preoperative diagnosis:  1.  Possible ureteral injury  Postoperative diagnosis: 1.  No ureteral injury  Procedure(s): 1.  Cystoscopy  Surgeon: Ellison Hughs, MD  Assistants:  None  Anesthesia:  General  Complications:  None  EBL: 0 mL  Specimens: 1.  None  Drains/Catheters: 1.  None  Intraoperative findings:   Both ureters were effluxing fluorescein No intravesical abnormalities  Indication:  [name] is a 36 y.o. female undergoing TAH/BSO with Dr. Belenda Cruise.  The procedure had to be converted to open and there was concern for possible ureteral injury intraoperatively.  Urology has been consulted for assessment  Description of procedure: The patient was already prepped and draped in the dorsolithotomy position.  A 19 French rigid cystoscope was then inserted into the urethral meatus and advanced into the bladder.  A complete bladder survey revealed no intravesical abnormalities.  Both ureters were observed for several minutes and found to be effluxing fluorescein, which was given by the primary and operative team.  The bladder was then drained.  She tolerated my portion of the procedure well.  The OB/GYN team then proceeded with the remainder of their operation.  Plan: Follow-up as needed

## 2021-07-29 NOTE — Brief Op Note (Addendum)
07/29/2021  11:42 AM  PATIENT:  [name]  36 y.o. female  PRE-OPERATIVE DIAGNOSIS:  menorrhagia, pelvic pain  POST-OPERATIVE DIAGNOSIS:  menorrhagia, pelvic pain  PROCEDURE:  Procedure(s): ATTEMPTED LAPAROSCOPIC ASSISTED VAGINAL HYSTERECTOMY WITH SALPINGECTOMY (Bilateral) CYSTOSCOPY (N/A) HYSTERECTOMY ABDOMINAL (N/A) supracervical  SURGEON:  Surgeon(s) and Role: Panel 1:    * Bovard-Stuckert, Mosella Kasa, MD - Primary    * Banga, Bonnee Quin, DO - Assisting Panel 2:    Janyth Contes, MD -assisting    * Ceasar Mons, MD - primary  ANESTHESIA:   local and general  EBL:  350 mL IVF and uop per anesthesia - clear urine (flourescein tinged)  BLOOD ADMINISTERED:none  DRAINS: Urinary Catheter (Foley)   LOCAL MEDICATIONS USED:  MARCAINE     SPECIMEN:  Source of Specimen:  uterus, cervix, B fallopian tubes  DISPOSITION OF SPECIMEN:  PATHOLOGY  COUNTS:  YES  TOURNIQUET:  * No tourniquets in log *  DICTATION: .Other Dictation: Dictation Number [id number]  PLAN OF CARE: Admit for overnight observation  PATIENT DISPOSITION:  PACU - hemodynamically stable.   Delay start of Pharmacological VTE agent (>24hrs) due to surgical blood loss or risk of bleeding: not applicable

## 2021-07-29 NOTE — Interval H&P Note (Signed)
History and Physical Interval Note:  07/29/2021 7:07 AM  [name]  has presented today for surgery, with the diagnosis of menorrhagia.  The various methods of treatment have been discussed with the patient and family. After consideration of risks, benefits and other options for treatment, the patient has consented to  Procedure(s): LAPAROSCOPIC ASSISTED VAGINAL HYSTERECTOMY WITH SALPINGECTOMY (Bilateral) CYSTOSCOPY (N/A) as a surgical intervention.  The patient's history has been reviewed, patient examined, no change in status, stable for surgery.  I have reviewed the patient's chart and labs.  Questions were answered to the patient's satisfaction.    D/W pt risk of open surgery given history of multiple abdominal surgeries.Jeral Fruit Bovard-Stuckert

## 2021-07-30 ENCOUNTER — Encounter (HOSPITAL_BASED_OUTPATIENT_CLINIC_OR_DEPARTMENT_OTHER): Payer: Self-pay | Admitting: Obstetrics and Gynecology

## 2021-07-30 DIAGNOSIS — Z90711 Acquired absence of uterus with remaining cervical stump: Secondary | ICD-10-CM

## 2021-07-30 HISTORY — DX: Acquired absence of uterus with remaining cervical stump: Z90.711

## 2021-07-30 LAB — BASIC METABOLIC PANEL
Anion gap: 5 (ref 5–15)
BUN: 8 mg/dL (ref 6–20)
CO2: 25 mmol/L (ref 22–32)
Calcium: 8.3 mg/dL — ABNORMAL LOW (ref 8.9–10.3)
Chloride: 105 mmol/L (ref 98–111)
Creatinine, Ser: 0.65 mg/dL (ref 0.44–1.00)
GFR, Estimated: >60 mL/min (ref >60)
Glucose, Bld: 124 mg/dL — ABNORMAL HIGH (ref 70–99)
Potassium: 3.7 mmol/L (ref 3.5–5.1)
Sodium: 135 mmol/L (ref 135–145)

## 2021-07-30 LAB — CBC
HCT: 32.4 % — ABNORMAL LOW (ref 36.0–46.0)
Hemoglobin: 10.9 g/dL — ABNORMAL LOW (ref 12.0–15.0)
MCH: 28.8 pg (ref 26.0–34.0)
MCHC: 33.6 g/dL (ref 30.0–36.0)
MCV: 85.5 fL (ref 80.0–100.0)
Platelets: 367 10*3/uL (ref 150–400)
RBC: 3.79 MIL/uL — ABNORMAL LOW (ref 3.87–5.11)
RDW: 13.8 % (ref 11.5–15.5)
WBC: 16.3 10*3/uL — ABNORMAL HIGH (ref 4.0–10.5)
nRBC: 0 % (ref 0.0–0.2)

## 2021-07-30 LAB — SURGICAL PATHOLOGY

## 2021-07-30 MED ORDER — HYDROCODONE-ACETAMINOPHEN 5-325 MG PO TABS: 1.0000 | ORAL_TABLET | Freq: Four times a day (QID) | ORAL | 0 refills | Status: DC | PRN

## 2021-07-30 MED ORDER — IBUPROFEN 800 MG PO TABS: 800.0000 mg | ORAL_TABLET | Freq: Three times a day (TID) | ORAL | 1 refills | Status: DC | PRN

## 2021-07-30 MED ORDER — HYDROCODONE-ACETAMINOPHEN 5-325 MG PO TABS
ORAL_TABLET | ORAL | Status: AC
  Filled 2021-07-30: qty 2

## 2021-07-30 NOTE — Discharge Summary (Signed)
Physician Discharge Summary  Patient ID: [name] MRN: [id number] DOB/AGE: Jun 15, 1985 36 y.o.  Admit date: 07/29/2021 Discharge date: 07/30/2021  Admission Diagnoses:  Discharge Diagnoses:  Principal Problem:   S/P abdominal supracervical subtotal hysterectomy Active Problems:   Abnormal uterine bleeding (AUB)   Pelvic pain   Discharged Condition: good  Hospital Course: Admitted for LAVH/BS/cysto - unable to perform LAVH, converted to Supracervical abdominal hysterectomy. POD#1 ambulating, voiding, tolerating po and pain controlled w po pain meds.  Labs stable.  Pt desires d/c to home.    Consults: urology intraop  Significant Diagnostic Studies: labs: CBC, BMP  Treatments: surgery: SCH/BS/cysto  Discharge Exam: Blood pressure 103/71, pulse 92, temperature 98.5 F (36.9 C), resp. rate (!) 22, height 5\' 5"  (1.651 m), weight (!) 142.4 kg, last menstrual period 07/01/2021, SpO2 93 %. General appearance: alert and no distress Resp: clear to auscultation bilaterally Cardio: regular rate and rhythm GI: soft, appropriately tender; bowel sounds normal; no masses,  no organomegaly Extremities: extremities normal, atraumatic, no cyanosis or edema Incision/Wound: C/D/I  Disposition: Discharge disposition: 01-Home or Self Care       Discharge Instructions      Remove dressing in 72 hours   Complete by: As directed    Take off honeycomb dressing - leave steristrips until they peel up.   Call MD for:  persistant nausea and vomiting   Complete by: As directed    Call MD for:  redness, tenderness, or signs of infection (pain, swelling, redness, odor or green/yellow discharge around incision site)   Complete by: As directed    Call MD for:  severe uncontrolled pain   Complete by: As directed    Diet - low sodium heart healthy   Complete by: As directed    Discharge instructions   Complete by: As directed    Call [phone] with questions or problems   Driving  Restrictions   Complete by: As directed    While taking strong pain medicine   Increase activity slowly   Complete by: As directed    Lifting restrictions   Complete by: As directed    No greater than 10-15lbs for 6 weeks   May shower / Bathe   Complete by: As directed    May walk up steps   Complete by: As directed    Sexual Activity Restrictions   Complete by: As directed    Pelvic rest 0 no douching, tampons or sex for 6 weeks      Allergies as of 07/30/2021       Reactions   Percocet [oxycodone-acetaminophen] Hives, Rash        Medication List     TAKE these medications    albuterol 108 (90 Base) MCG/ACT inhaler Commonly known as: VENTOLIN HFA Inhale 1-2 puffs into the lungs every 6 (six) hours as needed for wheezing or shortness of breath.   amLODipine 10 MG tablet Commonly known as: NORVASC Take 10 mg by mouth daily.   B-12 1000 MCG Subl Place 1,000 mcg under the tongue daily.   cycloSPORINE 0.05 % ophthalmic emulsion Commonly known as: RESTASIS Place 1 drop into both eyes 2 (two) times daily as needed (dry eyes).   ergocalciferol 1.25 MG (50000 UT) capsule Commonly known as: VITAMIN D2 Take 50,000 Units by mouth every Thursday.   HYDROcodone-acetaminophen 5-325 MG tablet Commonly known as: NORCO/VICODIN Take 1-2 tablets by mouth every 6 (six) hours as needed for moderate pain.   ibuprofen 800 MG tablet Commonly known  as: ADVIL Take 1 tablet (800 mg total) by mouth every 8 (eight) hours as needed for moderate pain (mild pain).   multivitamin with minerals Tabs tablet Take 1 tablet by mouth daily.   omeprazole 20 MG capsule Commonly known as: PRILOSEC Take 20 mg by mouth daily as needed (acid reflux).   potassium chloride SA 20 MEQ tablet Commonly known as: KLOR-CON M Take 20 mEq by mouth daily. X 5 days        Follow-up Information     Bovard-Stuckert, Daesha Insco, MD. Schedule an appointment as soon as possible for a visit in 2 week(s).    Specialty: Obstetrics and Gynecology Why: and 6 weeks for post-op checks Contact information: [address] [address] [phone]                 Signed: Janyth Contes 07/30/2021, 8:12 AM

## 2021-07-30 NOTE — Progress Notes (Signed)
Wasted 27 mg of IV Dilaudid in the stericycle from patient's discontinued PCA. Chauncey Cruel RN witnessed waste.

## 2021-07-30 NOTE — Progress Notes (Addendum)
1 Day Post-Op Procedure(s) (LRB): ATTEMPTED LAPAROSCOPIC ASSISTED VAGINAL HYSTERECTOMY WITH SALPINGECTOMY (Bilateral) CYSTOSCOPY (N/A) HYSTERECTOMY ABDOMINAL (N/A) CYSTOSCOPY  Subjective: Patient reports incisional pain, tolerating PO, and no problems voiding.  Some burning at incision, reassured pt - d/w pt healing process.  Ambulating, voiding, tolerating po and pain controlled  Objective: I have reviewed patient's vital signs, intake and output, medications, and labs.  General: alert and no distress Resp: clear to auscultation bilaterally Cardio: regular rate and rhythm GI: soft, appropriately tender; bowel sounds normal; no masses,  no organomegaly Extremities: extremities normal, atraumatic, no cyanosis or edema Inc: C/D/I  Assessment: s/p Procedure(s): ATTEMPTED LAPAROSCOPIC ASSISTED VAGINAL HYSTERECTOMY WITH SALPINGECTOMY (Bilateral) CYSTOSCOPY (N/A) HYSTERECTOMY ABDOMINAL (N/A) CYSTOSCOPY: stable and progressing well  Plan: Encourage ambulation  D/w home with Vicodin and Motrin.  F/U 2 and 6 weeks   LOS: 1 day    Amere Iott Bovard-Stuckert 07/30/2021, 8:05 AM

## 2021-08-01 ENCOUNTER — Encounter (HOSPITAL_BASED_OUTPATIENT_CLINIC_OR_DEPARTMENT_OTHER): Payer: Self-pay | Admitting: Obstetrics and Gynecology

## 2022-01-06 ENCOUNTER — Other Ambulatory Visit: Payer: Self-pay | Admitting: *Deleted

## 2022-01-06 DIAGNOSIS — D509 Iron deficiency anemia, unspecified: Secondary | ICD-10-CM

## 2022-01-08 ENCOUNTER — Inpatient Hospital Stay: Payer: Medicaid Other

## 2022-01-08 ENCOUNTER — Inpatient Hospital Stay: Payer: Medicaid Other | Attending: Hematology | Admitting: Hematology

## 2022-01-17 ENCOUNTER — Telehealth: Payer: Self-pay | Admitting: Hematology

## 2022-01-17 NOTE — Telephone Encounter (Signed)
Per 8/10 phone line pt called to r/s a missed appointment

## 2022-02-21 ENCOUNTER — Encounter (HOSPITAL_COMMUNITY): Payer: Self-pay

## 2022-02-21 ENCOUNTER — Ambulatory Visit (HOSPITAL_COMMUNITY)
Admission: EM | Admit: 2022-02-21 | Discharge: 2022-02-21 | Disposition: A | Discharge status: Home / Self Care | Payer: Medicaid Other

## 2022-02-21 DIAGNOSIS — T63441A Toxic effect of venom of bees, accidental (unintentional), initial encounter: Secondary | ICD-10-CM

## 2022-02-21 NOTE — ED Triage Notes (Signed)
Pt presents to the office for a bee strung. She wanted to make sure she does not have bee venom inside her skin.

## 2022-02-21 NOTE — Discharge Instructions (Addendum)
You may purchase over-the-counter hydrocortisone 10 cream and use this as needed for itching if you develop any itch to the site.   Follow-up with urgent care if needed if you develop any new or worsening symptoms.

## 2022-02-21 NOTE — ED Provider Notes (Signed)
[facility]    CSN: [id number] Arrival date & time: 02/21/22  1446      History   Chief Complaint Chief Complaint  Patient presents with   Insect Bite    HPI [name] [name] is a 36 y.o. female.   Patient presents urgent care for evaluation after she was stung by bee approximately 1 hour ago to the her left side/oblique area.  Patient was driving her car with the window down, pulled over into a business parking lot, and pain/stinging to her left side.  When she looked down at her car seat after getting out of the car, she saw a bee and assumes that she was stung by the bee.  She is concerned that there "may be bee venom inside of her" and wanted to come to urgent care to get checked out.  She is denying shortness of breath, throat swelling, chest pain, dizziness, and blurry vision.  She states that after the bee sting she felt as though there was a "bump" but is unable to see the bump at this time.  She has never been stung by bee in the past.  The area where the bee sting happened burned slightly when she first noticed what happened but does not currently hurt and does not currently itch.  Has not attempted any use over-the-counter medications prior to arrival urgent care for symptoms.     Past Medical History:  Diagnosis Date   Abnormal Pap smear    colposcopy, mild dysplasia, HPV   Acid reflux    Anemia    Anxiety    on meds- stable   Bipolar disorder (HCC)    Bronchitis    Chlamydia    COVID-19 04/2020   sore throat, fever, SOB, runny nose, headache. Pt was hospitalized due to low oxygen levels.   Depression    on meds for bipolar   Fibroid    Gonorrhea    History of PID    Hypertension    Influenza A 05/07/2021   Menorrhagia    Obesity    Panic attack    Pneumonia    05/2020   Pre-diabetes    Pregnancy induced hypertension    PTSD (post-traumatic stress disorder)    S/P abdominal supracervical subtotal hysterectomy 07/30/2021   S/P total abdominal  hysterectomy 07/29/2021   Sleep apnea    Patient had a sleep study on 07/10/19 that revealed moderate sleep apnea. Per 07/17/21 phone call with patient, she wears a CPAP nightly.   Trichomonas    Urinary tract infection    Uterine leiomyoma 05/01/2020   Wears glasses     Patient Active Problem List   Diagnosis Date Noted   S/P abdominal supracervical subtotal hysterectomy 07/30/2021   S/P total abdominal hysterectomy 07/29/2021   S/P TAH (total abdominal hysterectomy) 07/29/2021   Abnormal uterine bleeding (AUB) 07/03/2021   S/P myomectomy 08/06/2020   Uterine fibroid 08/06/2020   Uterine leiomyoma 05/01/2020   Acute hypoxemic respiratory failure due to COVID-19 ([place]) 04/20/2020   Iron deficiency anemia 03/06/2020   Incarcerated incisional hernia s/p lap repair 12/02/2012 12/03/2012   Hypertension    Acid reflux    Anxiety    Obesity, Class III, BMI 40-49.9 (morbid obesity) ([place])    Sleep apnea    Chronic cholecystitis with calculus s/p lap chole [id number] 10/04/2012    Past Surgical History:  Procedure Laterality Date   ABDOMINAL SURGERY     CESAREAN SECTION  05/02/03,05/04/08,10/23/10   x3  CHOLECYSTECTOMY N/A 10/07/2012   Procedure: LAPAROSCOPIC CHOLECYSTECTOMY WITH INTRAOPERATIVE CHOLANGIOGRAM;  Surgeon: Imogene Burn. Georgette Dover, MD;  Location: [facility];  Service: General;  Laterality: N/A;   CYSTOSCOPY N/A 07/29/2021   Procedure: CYSTOSCOPY;  Surgeon: Janyth Contes, MD;  Location: [facility];  Service: Gynecology;  Laterality: N/A;   CYSTOSCOPY  07/29/2021   Procedure: CYSTOSCOPY;  Surgeon: Janyth Contes, MD;  Location: [facility];  Service: Gynecology;;   ESOPHAGOGASTRODUODENOSCOPY (EGD) WITH PROPOFOL N/A 10/08/2018   Procedure: ESOPHAGOGASTRODUODENOSCOPY (EGD) WITH PROPOFOL;  Surgeon: Carol Ada, MD;  Location: [facility];  Service: Endoscopy;  Laterality: N/A;   FOOT SURGERY  1995   INSERTION OF MESH N/A 12/02/2012   Procedure:  INSERTION OF MESH;  Surgeon: Adin Hector, MD;  Location: [facility];  Service: General;  Laterality: N/A;   LAPAROSCOPIC VAGINAL HYSTERECTOMY WITH SALPINGECTOMY Bilateral 07/29/2021   Procedure: ATTEMPTED LAPAROSCOPIC ASSISTED VAGINAL HYSTERECTOMY WITH SALPINGECTOMY;  Surgeon: Janyth Contes, MD;  Location: [facility];  Service: Gynecology;  Laterality: Bilateral;   MYOMECTOMY N/A 08/06/2020   Procedure: ABDOMINAL MYOMECTOMY;  Surgeon: Janyth Contes, MD;  Location: [facility];  Service: Gynecology;  Laterality: N/A;  TAP BLOCK and general   SUPRACERVICAL ABDOMINAL HYSTERECTOMY N/A 07/29/2021   Procedure: HYSTERECTOMY SUPRACERVICAL ABDOMINAL;  Surgeon: Janyth Contes, MD;  Location: [facility];  Service: Gynecology;  Laterality: N/A;   TUBAL LIGATION  7096   UMBILICAL HERNIA REPAIR N/A 12/02/2012   Procedure: LAPAROSCOPIC UMBILICAL HERNIA;  Surgeon: Adin Hector, MD;  Location: [facility];  Service: General;  Laterality: N/A;   WISDOM TOOTH EXTRACTION  2001    OB History     Gravida  3   Para  3   Term  3   Preterm      AB      Living  3      SAB      IAB      Ectopic      Multiple      Live Births  2            Home Medications    Prior to Admission medications   Medication Sig Start Date End Date Taking? Authorizing Provider  albuterol (VENTOLIN HFA) 108 (90 Base) MCG/ACT inhaler Inhale 1-2 puffs into the lungs every 6 (six) hours as needed for wheezing or shortness of breath. 05/10/21   Teodora Medici, FNP  amLODipine (NORVASC) 10 MG tablet Take 10 mg by mouth daily. 09/07/18   [provider]  Cyanocobalamin (B-12) 1000 MCG SUBL Place 1,000 mcg under the tongue daily. 03/12/20   Brunetta Genera, MD  cycloSPORINE (RESTASIS) 0.05 % ophthalmic emulsion Place 1 drop into both eyes 2 (two) times daily as needed (dry eyes).    [provider]  ergocalciferol (VITAMIN D2) 1.25 MG (50000 UT) capsule Take  50,000 Units by mouth every Thursday.    [provider]  HYDROcodone-acetaminophen (NORCO/VICODIN) 5-325 MG tablet Take 1-2 tablets by mouth every 6 (six) hours as needed for moderate pain. 07/30/21   Bovard-Stuckert, Jeral Fruit, MD  ibuprofen (ADVIL) 800 MG tablet Take 1 tablet (800 mg total) by mouth every 8 (eight) hours as needed for moderate pain (mild pain). 07/30/21   Bovard-Stuckert, Jeral Fruit, MD  Multiple Vitamin (MULTIVITAMIN WITH MINERALS) TABS tablet Take 1 tablet by mouth daily.    [provider]  omeprazole (PRILOSEC) 20 MG capsule Take 20 mg by mouth daily as needed (acid reflux).  [provider]  potassium chloride SA (KLOR-CON M) 20 MEQ tablet Take 20 mEq by mouth daily. X 5 days    Bovard-Stuckert, Jody, MD    Family History Family History  Problem Relation Age of Onset   Hypertension Mother    Heart disease Father    Stroke Father    Other Neg Hx     Social History Social History   Tobacco Use   Smoking status: Former    Packs/day: 0.25    Years: 1.00    Total pack years: 0.25    Types: Cigarettes    Quit date: 2014    Years since quitting: 9.7   Smokeless tobacco: Never  Vaping Use   Vaping Use: Never used  Substance Use Topics   Alcohol use: No   Drug use: No     Allergies   Percocet [oxycodone-acetaminophen]   Review of Systems Review of Systems Per HPI  Physical Exam Triage Vital Signs ED Triage Vitals [02/21/22 1533]  Enc Vitals Group     BP (!) 141/103     Pulse Rate 86     Resp 18     Temp 99.2 F (37.3 C)     Temp Source Oral     SpO2 99 %     Weight      Height      Head Circumference      Peak Flow      Pain Score      Pain Loc      Pain Edu?      Excl. in [place]?    No data found.  Updated Vital Signs BP (!) 141/103 (BP Location: Left Arm)   Pulse 86   Temp 99.2 F (37.3 C) (Oral)   Resp 18   SpO2 99%   Visual Acuity Right Eye Distance:   Left Eye Distance:   Bilateral Distance:    Right Eye  Near:   Left Eye Near:    Bilateral Near:     Physical Exam Vitals and nursing note reviewed.  Constitutional:      Appearance: Normal appearance. She is obese. She is not ill-appearing or toxic-appearing.     Comments: Very pleasant patient sitting on exam in position of comfort table in no acute distress.   HENT:     Head: Normocephalic and atraumatic.     Right Ear: Hearing and external ear normal.     Left Ear: Hearing and external ear normal.     Nose: Nose normal.     Mouth/Throat:     Lips: Pink.     Mouth: Mucous membranes are moist.     Pharynx: No posterior oropharyngeal erythema.     Comments: No swelling. Eyes:     General: Lids are normal. Vision grossly intact. Gaze aligned appropriately.     Extraocular Movements: Extraocular movements intact.     Conjunctiva/sclera: Conjunctivae normal.  Pulmonary:     Effort: Pulmonary effort is normal.     Comments: No respiratory distress.  Airway is intact. Abdominal:     Palpations: Abdomen is soft.  Musculoskeletal:     Cervical back: Neck supple.  Skin:    General: Skin is warm and dry.     Capillary Refill: Capillary refill takes less than 2 seconds.     Findings: No rash.     Comments: Unable to visualize site of the sting/injury to the left side/lateral left lower abdominal area.  No evidence of erythema, warmth, or swelling.  Neurological:     General: No focal deficit present.     Mental Status: She is alert and oriented to person, place, and time. Mental status is at baseline.     Cranial Nerves: No dysarthria or facial asymmetry.     Motor: No weakness.     Gait: Gait is intact.  Psychiatric:        Mood and Affect: Mood normal.        Speech: Speech normal.        Behavior: Behavior normal.        Thought Content: Thought content normal.        Judgment: Judgment normal.      UC Treatments / Results  Labs (all labs ordered are listed, but only abnormal results are displayed) Labs Reviewed - No data  to display  EKG   Radiology No results found.  Procedures Procedures (including critical care time)  Medications Ordered in UC Medications - No data to display  Initial Impression / Assessment and Plan / UC Course  I have reviewed the triage vital signs and the nursing notes.  Pertinent labs & imaging results that were available during my care of the patient were reviewed by me and considered in my medical decision making (see chart for details).   1.  Bee sting Unable to visualize site of bee sting.  Patient is not experiencing any systemic symptoms at this time and there is no concern for anaphylaxis.  Vital signs are hemodynamically stable.  She may purchase over-the-counter hydrocortisone cream and use this as needed as directed for any itch she may develop to the area.  She is currently asymptomatic.  Patient agreeable with this plan.  Strict ED/urgent care return precautions given.  Patient verbalizes understanding and agreement with plan.  Counseled patient regarding possible side effects and uses of all medications prescribed at today's visit.  Patient verbalizes understanding and agreement with plan.  All questions answered.  Patient discharged from urgent care in stable condition.   Final Clinical Impressions(s) / UC Diagnoses   Final diagnoses:  Bee sting, accidental or unintentional, initial encounter     Discharge Instructions      You may purchase over-the-counter hydrocortisone 10 cream and use this as needed for itching if you develop any itch to the site.   Follow-up with urgent care if needed if you develop any new or worsening symptoms.     ED Prescriptions   None    PDMP not reviewed this encounter.   Talbot Grumbling, [place] 02/21/22 1553

## 2022-02-26 ENCOUNTER — Inpatient Hospital Stay (HOSPITAL_BASED_OUTPATIENT_CLINIC_OR_DEPARTMENT_OTHER): Payer: Medicaid Other | Admitting: Hematology

## 2022-02-26 ENCOUNTER — Other Ambulatory Visit: Payer: Self-pay

## 2022-02-26 ENCOUNTER — Inpatient Hospital Stay: Payer: Medicaid Other | Attending: Hematology

## 2022-02-26 VITALS — BP 127/85 | HR 89 | Temp 97.8°F | Resp 18 | Ht 65.0 in | Wt 310.8 lb

## 2022-02-26 DIAGNOSIS — D509 Iron deficiency anemia, unspecified: Secondary | ICD-10-CM | POA: Diagnosis not present

## 2022-02-26 DIAGNOSIS — Z87891 Personal history of nicotine dependence: Secondary | ICD-10-CM | POA: Insufficient documentation

## 2022-02-26 DIAGNOSIS — I10 Essential (primary) hypertension: Secondary | ICD-10-CM | POA: Diagnosis not present

## 2022-02-26 DIAGNOSIS — N920 Excessive and frequent menstruation with regular cycle: Secondary | ICD-10-CM | POA: Diagnosis not present

## 2022-02-26 DIAGNOSIS — E538 Deficiency of other specified B group vitamins: Secondary | ICD-10-CM | POA: Diagnosis not present

## 2022-02-26 DIAGNOSIS — D500 Iron deficiency anemia secondary to blood loss (chronic): Secondary | ICD-10-CM | POA: Diagnosis present

## 2022-02-26 LAB — CMP (CANCER CENTER ONLY)
ALT: 11 U/L (ref 0–44)
AST: 14 U/L — ABNORMAL LOW (ref 15–41)
Albumin: 3.9 g/dL (ref 3.5–5.0)
Alkaline Phosphatase: 75 U/L (ref 38–126)
Anion gap: 6 (ref 5–15)
BUN: 6 mg/dL (ref 6–20)
CO2: 29 mmol/L (ref 22–32)
Calcium: 9.1 mg/dL (ref 8.9–10.3)
Chloride: 105 mmol/L (ref 98–111)
Creatinine: 0.64 mg/dL (ref 0.44–1.00)
GFR, Estimated: >60 mL/min (ref >60)
Glucose, Bld: 114 mg/dL — ABNORMAL HIGH (ref 70–99)
Potassium: 3 mmol/L — ABNORMAL LOW (ref 3.5–5.1)
Sodium: 140 mmol/L (ref 135–145)
Total Bilirubin: 0.4 mg/dL (ref 0.3–1.2)
Total Protein: 7.2 g/dL (ref 6.5–8.1)

## 2022-02-26 LAB — CBC WITH DIFFERENTIAL (CANCER CENTER ONLY)
Abs Immature Granulocytes: 0.04 10*3/uL (ref 0.00–0.07)
Basophils Absolute: 0 10*3/uL (ref 0.0–0.1)
Basophils Relative: 0 %
Eosinophils Absolute: 0.1 10*3/uL (ref 0.0–0.5)
Eosinophils Relative: 1 %
HCT: 38.9 % (ref 36.0–46.0)
Hemoglobin: 13.4 g/dL (ref 12.0–15.0)
Immature Granulocytes: 0 %
Lymphocytes Relative: 29 %
Lymphs Abs: 3.5 10*3/uL (ref 0.7–4.0)
MCH: 28 pg (ref 26.0–34.0)
MCHC: 34.4 g/dL (ref 30.0–36.0)
MCV: 81.4 fL (ref 80.0–100.0)
Monocytes Absolute: 0.6 10*3/uL (ref 0.1–1.0)
Monocytes Relative: 5 %
Neutro Abs: 7.7 10*3/uL (ref 1.7–7.7)
Neutrophils Relative %: 65 %
Platelet Count: 369 10*3/uL (ref 150–400)
RBC: 4.78 MIL/uL (ref 3.87–5.11)
RDW: 13.8 % (ref 11.5–15.5)
WBC Count: 11.9 10*3/uL — ABNORMAL HIGH (ref 4.0–10.5)
nRBC: 0 % (ref 0.0–0.2)

## 2022-02-26 LAB — IRON AND IRON BINDING CAPACITY (CC-WL,HP ONLY)
Iron: 55 ug/dL (ref 28–170)
Saturation Ratios: 18 % (ref 10.4–31.8)
TIBC: 314 ug/dL (ref 250–450)
UIBC: 259 ug/dL (ref 148–442)

## 2022-02-26 LAB — FERRITIN: Ferritin: 36 ng/mL (ref 11–307)

## 2022-02-26 LAB — VITAMIN B12: Vitamin B-12: 389 pg/mL (ref 180–914)

## 2022-03-05 ENCOUNTER — Encounter: Payer: Self-pay | Admitting: Hematology

## 2022-03-05 NOTE — Progress Notes (Signed)
HEMATOLOGY/ONCOLOGY CLINIC NOTE  Date of Service: .02/26/2022   Patient Care Team: [name], PA as PCP - General (Physician Assistant)  CHIEF COMPLAINTS/PURPOSE OF CONSULTATION:  Follow-up for continued evaluation and management of iron deficiency anemia  HISTORY OF PRESENTING ILLNESS:  See previous notes for details on initial presentation  INTERVAL HISTORY:  [name] is here for continued follow-up her iron deficiency anemia related to her menorrhagia. She notes some fatigue but no other acute new symptoms. Labs done today were discussed with her in detail.   MEDICAL HISTORY:  Past Medical History:  Diagnosis Date   Abnormal Pap smear    colposcopy, mild dysplasia, HPV   Acid reflux    Anemia    Anxiety    on meds- stable   Bipolar disorder (HCC)    Bronchitis    Chlamydia    COVID-19 04/2020   sore throat, fever, SOB, runny nose, headache. Pt was hospitalized due to low oxygen levels.   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 Per 07/17/21 phone call with patient, she wears a CPAP nightly.   Trichomonas    Urinary tract infection    Uterine leiomyoma 05/01/2020   Wears glasses     SURGICAL HISTORY: Past Surgical History:  Procedure Laterality Date   ABDOMINAL SURGERY     CESAREAN SECTION  05/02/03,05/04/08,10/23/10   x3   CHOLECYSTECTOMY N/A 10/07/2012   Procedure: LAPAROSCOPIC CHOLECYSTECTOMY WITH INTRAOPERATIVE CHOLANGIOGRAM;  Surgeon: Imogene Burn. Georgette Dover, MD;  Location: [facility];  Service: General;   Laterality: N/A;   CYSTOSCOPY N/A 07/29/2021   Procedure: CYSTOSCOPY;  Surgeon: Janyth Contes, MD;  Location: [place];  Service: Gynecology;  Laterality: N/A;   CYSTOSCOPY  07/29/2021   Procedure: CYSTOSCOPY;  Surgeon: Janyth Contes, MD;  Location: [facility];  Service: Gynecology;;   ESOPHAGOGASTRODUODENOSCOPY (EGD) WITH PROPOFOL N/A 10/08/2018   Procedure: ESOPHAGOGASTRODUODENOSCOPY (EGD) WITH PROPOFOL;  Surgeon: Carol Ada, MD;  [facility]: [facility];  Service: Endoscopy;  Laterality: N/A;   FOOT SURGERY  1995   INSERTION OF MESH N/A 12/02/2012   Procedure: INSERTION OF MESH;  Surgeon: Adin Hector, MD;  Location: [facility];  Service: General;  Laterality: N/A;   LAPAROSCOPIC VAGINAL HYSTERECTOMY WITH SALPINGECTOMY Bilateral 07/29/2021   Procedure: ATTEMPTED LAPAROSCOPIC ASSISTED VAGINAL HYSTERECTOMY WITH SALPINGECTOMY;  Surgeon: Janyth Contes, MD;  Location: [place];  Service: Gynecology;  Laterality: Bilateral;   MYOMECTOMY N/A 08/06/2020   Procedure: ABDOMINAL MYOMECTOMY;  Surgeon: Janyth Contes, MD;  Location: [place];  Service: Gynecology;  Laterality: N/A;  TAP BLOCK and general   SUPRACERVICAL ABDOMINAL HYSTERECTOMY N/A 07/29/2021   Procedure: HYSTERECTOMY SUPRACERVICAL ABDOMINAL;  Surgeon: Janyth Contes, MD;  Location: [place];  Service: Gynecology;  Laterality: N/A;   TUBAL LIGATION  1941   UMBILICAL HERNIA REPAIR N/A 12/02/2012   Procedure: LAPAROSCOPIC UMBILICAL HERNIA;  Surgeon: Adin Hector, MD;  Location: [facility];  Service: General;  Laterality: N/A;   WISDOM TOOTH EXTRACTION  2001    SOCIAL HISTORY: Social History   Socioeconomic History   Marital status: Single    Spouse name: Not on file   Number of children: Not on file   Years of education: Not on file   Highest education level: Not on file  Occupational History   Not on file  Tobacco Use   Smoking  status: Former    Packs/day: 0.25    Years: 1.00    Total pack years: 0.25    Types: Cigarettes    Quit date: 2014    Years since quitting: 9.7   Smokeless tobacco: Never  Vaping Use   Vaping Use: Never used  Substance and Sexual Activity   Alcohol use: No   Drug use: No   Sexual activity: Yes    Birth control/protection: I.U.D.  Other Topics Concern   Not on file  Social History Narrative   Not on file   Social Determinants of Health   Financial Resource Strain: Not on file  Food Insecurity: Not on file  Transportation Needs: Not on file  Physical Activity: Not on file  Stress: Not on file  Social Connections: Not on file  Intimate Partner Violence: Not on file    FAMILY HISTORY: Family History  Problem Relation Age of Onset   Hypertension Mother    Heart disease Father    Stroke Father    Other Neg Hx     ALLERGIES:  is allergic to percocet [oxycodone-acetaminophen].  MEDICATIONS:  Current Outpatient Medications  Medication Sig Dispense Refill   amLODipine (NORVASC) 10 MG tablet Take 10 mg by mouth daily.     Cyanocobalamin (B-12) 1000 MCG SUBL Place 1,000 mcg under the tongue daily. 30 tablet 5   cycloSPORINE (RESTASIS) 0.05 % ophthalmic emulsion Place 1 drop into both eyes 2 (two) times daily as needed (dry eyes).     ergocalciferol (VITAMIN D2) 1.25 MG (50000 UT) capsule Take 50,000 Units by mouth every Thursday.     gabapentin (NEURONTIN) 300 MG capsule Take 300 mg by mouth at bedtime.     ibuprofen (ADVIL) 800 MG tablet Take 1 tablet (800 mg total) by mouth every 8 (eight) hours as needed for moderate pain (mild pain). 45 tablet 1   Multiple Vitamin (MULTIVITAMIN WITH MINERALS) TABS tablet Take 1 tablet by mouth daily.     omeprazole (PRILOSEC) 20 MG capsule Take 20 mg by mouth daily as needed (acid reflux).      albuterol (VENTOLIN HFA) 108 (90 Base) MCG/ACT inhaler Inhale 1-2 puffs into the lungs every 6 (six) hours as needed for wheezing or shortness of  breath. (Patient not taking: Reported on 02/26/2022) 1 each 0   HYDROcodone-acetaminophen (NORCO/VICODIN) 5-325 MG tablet Take 1-2 tablets by mouth every 6 (six) hours as needed for moderate pain. (Patient not taking: Reported on 02/26/2022) 20 tablet 0   potassium chloride SA (KLOR-CON M) 20 MEQ tablet Take 20 mEq by mouth daily. X 5 days (Patient not taking: Reported on 02/26/2022)     No current facility-administered medications for this visit.    REVIEW OF SYSTEMS:   10 Point review of Systems was done is negative except as noted above.  PHYSICAL EXAMINATION: ECOG PERFORMANCE STATUS: 1 - Symptomatic but completely ambulatory  . Vitals:   02/26/22 1052  BP: 127/85  Pulse: 89  Resp: 18  Temp: 97.8 F (36.6 C)  SpO2: 96%  Filed Weights   02/26/22 1052  Weight: (!) 310 lb 12.8 oz (141 kg)    .Body mass index is 51.72 kg/m.  NAD GENERAL:alert, in no acute distress and comfortable SKIN: no acute rashes, no significant lesions EYES: conjunctiva are pink and non-injected, sclera anicteric OROPHARYNX: MMM, no exudates, no oropharyngeal erythema or ulceration NECK: supple, no JVD LYMPH:  no palpable lymphadenopathy in the cervical, axillary or inguinal regions LUNGS: clear to auscultation b/l with normal respiratory effort HEART: regular rate & rhythm ABDOMEN:  normoactive bowel sounds , non tender, not distended. Extremity: no pedal edema PSYCH: alert & oriented x 3 with fluent speech NEURO: no focal motor/sensory deficits  LABORATORY DATA:  I have reviewed the data as listed  .Marland Kitchen    Latest Ref Rng & Units 02/26/2022   10:40 AM 07/30/2021   12:41 AM 07/29/2021    6:31 AM  CBC  WBC 4.0 - 10.5 K/uL 11.9  16.3    Hemoglobin 12.0 - 15.0 g/dL 13.4  10.9  12.9   Hematocrit 36.0 - 46.0 % 38.9  32.4  38.0   Platelets 150 - 400 K/uL 369  367       .    Latest Ref Rng & Units 02/26/2022   10:40 AM 07/30/2021   12:41 AM 07/29/2021    6:31 AM  CMP  Glucose 70 - 99 mg/dL  114  124  98   BUN 6 - 20 mg/dL '6  8  7   '$ Creatinine 0.44 - 1.00 mg/dL 0.64  0.65  0.70   Sodium 135 - 145 mmol/L 140  135  141   Potassium 3.5 - 5.1 mmol/L 3.0  3.7  2.9   Chloride 98 - 111 mmol/L 105  105  102   CO2 22 - 32 mmol/L 29  25    Calcium 8.9 - 10.3 mg/dL 9.1  8.3    Total Protein 6.5 - 8.1 g/dL 7.2     Total Bilirubin 0.3 - 1.2 mg/dL 0.4     Alkaline Phos 38 - 126 U/L 75     AST 15 - 41 U/L 14     ALT 0 - 44 U/L 11      . Lab Results  Component Value Date   IRON 55 02/26/2022   TIBC 314 02/26/2022   IRONPCTSAT 18 02/26/2022   (Iron and TIBC)  Lab Results  Component Value Date   FERRITIN 36 02/26/2022    RADIOGRAPHIC STUDIES: I have personally reviewed the radiological images as listed and agreed with the findings in the report. No results found.  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 CBC shows no anemia with a hemoglobin of 13.4 Ferritin down to 36 with an iron saturation of 18% B12 -389 -Continue with iron rich foods -Continue potassium replacement with primary care physician We will offer the patient additional IV iron to keep the ferritin levels more than 50 closer to 100  5) B12 deficiency B12 -- levels 202 previously -now improved to 373 -SL B12 1000 mcg daily   FOLLOW UP: Return to clinic with Dr. Irene Limbo with labs in 6 months   The total time spent in the appointment was 15  minutes*.  All of the patient's questions were answered with apparent satisfaction. The patient knows to call the clinic with any problems, questions or concerns.   Sullivan Lone MD MS AAHIVMS [facility] Hematology/Oncology Physician [facility]  .*Total Encounter Time as defined by the Centers for Medicare and Medicaid Services includes, in addition to the face-to-face time of a patient visit (documented in the note above) non-face-to-face time: obtaining and reviewing outside history, ordering and reviewing medications, tests or procedures, care coordination (communications with other health care professionals or caregivers) and documentation in the medical record.

## 2022-03-18 ENCOUNTER — Ambulatory Visit
Admission: EM | Admit: 2022-03-18 | Discharge: 2022-03-18 | Disposition: A | Discharge status: Home / Self Care | Payer: Medicaid Other | Attending: Physician Assistant | Admitting: Physician Assistant

## 2022-03-18 DIAGNOSIS — Z1152 Encounter for screening for COVID-19: Secondary | ICD-10-CM | POA: Insufficient documentation

## 2022-03-18 DIAGNOSIS — Z113 Encounter for screening for infections with a predominantly sexual mode of transmission: Secondary | ICD-10-CM | POA: Insufficient documentation

## 2022-03-18 DIAGNOSIS — J029 Acute pharyngitis, unspecified: Secondary | ICD-10-CM | POA: Diagnosis present

## 2022-03-18 LAB — POCT RAPID STREP A (OFFICE): Rapid Strep A Screen: POSITIVE — AB

## 2022-03-18 MED ORDER — AMOXICILLIN 500 MG PO CAPS: 500.0000 mg | ORAL_CAPSULE | Freq: Three times a day (TID) | ORAL | 0 refills | Status: DC

## 2022-03-18 NOTE — ED Triage Notes (Addendum)
Pt presents to [facility] with co of sore throat, and vaginal itchiness. Pt reports she was tested at the doctor via urine  but nothing has came back unsure if sti related or sick.

## 2022-03-18 NOTE — ED Provider Notes (Signed)
[name]    CSN: [id number] Arrival date & time: 03/18/22  0934      History   Chief Complaint Chief Complaint  Patient presents with   Sore Throat   Vaginal Itching    HPI [name] is a 36 y.o. female.   Patient here today for evaluation of sore throat that has been ongoing for the last few days.  She also reports vaginal itching and is not sure if 2 symptoms are connected.  She was tested at her doctor for STDs via urine sample however this was negative.  She has not had fever.  She denies any cough or congestion.  She has tried over-the-counter medication without resolution of symptoms.  The history is provided by the patient.  Sore Throat Pertinent negatives include no abdominal pain and no shortness of breath.  Vaginal Itching Pertinent negatives include no abdominal pain and no shortness of breath.    Past Medical History:  Diagnosis Date   Abnormal Pap smear    colposcopy, mild dysplasia, HPV   Acid reflux    Anemia    Anxiety    on meds- stable   Bipolar disorder (HCC)    Bronchitis    Chlamydia    COVID-19 04/2020   sore throat, fever, SOB, runny nose, headache. Pt was hospitalized due to low oxygen levels.   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 Per 07/17/21 phone call with patient, she wears a CPAP nightly.   Trichomonas    Urinary tract infection    Uterine leiomyoma 05/01/2020   Wears glasses     Patient Active Problem List   Diagnosis Date Noted   S/P abdominal supracervical subtotal  hysterectomy 07/30/2021   S/P total abdominal hysterectomy 07/29/2021   S/P TAH (total abdominal hysterectomy) 07/29/2021   Abnormal uterine bleeding (AUB) 07/03/2021   S/P myomectomy 08/06/2020   Uterine fibroid 08/06/2020   Uterine leiomyoma 05/01/2020   Acute hypoxemic respiratory failure due to COVID-19 ([place]) 04/20/2020   Iron deficiency anemia 03/06/2020   Incarcerated incisional hernia s/p lap repair 12/02/2012 12/03/2012   Hypertension    Acid reflux    Anxiety    Obesity, Class III, BMI 40-49.9 (morbid obesity) ([place])    Sleep apnea    Chronic cholecystitis with calculus s/p lap chole [id number] 10/04/2012    Past Surgical History:  Procedure Laterality Date   ABDOMINAL SURGERY     CESAREAN SECTION  05/02/03,05/04/08,10/23/10   x3   CHOLECYSTECTOMY N/A 10/07/2012   Procedure: LAPAROSCOPIC CHOLECYSTECTOMY WITH INTRAOPERATIVE CHOLANGIOGRAM;  Surgeon: Imogene Burn. Georgette Dover, MD;  Location: [facility];  Service: General;  Laterality: N/A;   CYSTOSCOPY N/A 07/29/2021   Procedure: CYSTOSCOPY;  Surgeon: Janyth Contes, MD;  Location: [place];  Service: Gynecology;  Laterality: N/A;   CYSTOSCOPY  07/29/2021   Procedure: CYSTOSCOPY;  Surgeon: Janyth Contes, MD;  Location: [place];  Service: Gynecology;;   ESOPHAGOGASTRODUODENOSCOPY (  EGD) WITH PROPOFOL N/A 10/08/2018   Procedure: ESOPHAGOGASTRODUODENOSCOPY (EGD) WITH PROPOFOL;  Surgeon: Carol Ada, MD;  [facility];  Service: Endoscopy;  Laterality: N/A;   FOOT SURGERY  1995   INSERTION OF MESH N/A 12/02/2012   Procedure: INSERTION OF MESH;  Surgeon: Adin Hector, MD;  Location: [facility];  Service: General;  Laterality: N/A;   LAPAROSCOPIC VAGINAL HYSTERECTOMY WITH SALPINGECTOMY Bilateral 07/29/2021   Procedure: ATTEMPTED LAPAROSCOPIC ASSISTED VAGINAL HYSTERECTOMY WITH SALPINGECTOMY;  Surgeon: Janyth Contes, MD;  Location: [place];  Service: Gynecology;   Laterality: Bilateral;   MYOMECTOMY N/A 08/06/2020   Procedure: ABDOMINAL MYOMECTOMY;  Surgeon: Janyth Contes, MD;  Location: [place];  Service: Gynecology;  Laterality: N/A;  TAP BLOCK and general   SUPRACERVICAL ABDOMINAL HYSTERECTOMY N/A 07/29/2021   Procedure: HYSTERECTOMY SUPRACERVICAL ABDOMINAL;  Surgeon: Janyth Contes, MD;  Location: [place];  Service: Gynecology;  Laterality: N/A;   TUBAL LIGATION  1610   UMBILICAL HERNIA REPAIR N/A 12/02/2012   Procedure: LAPAROSCOPIC UMBILICAL HERNIA;  Surgeon: Adin Hector, MD;  Location: [facility];  Service: General;  Laterality: N/A;   WISDOM TOOTH EXTRACTION  2001    OB History     Gravida  3   Para  3   Term  3   Preterm      AB      Living  3      SAB      IAB      Ectopic      Multiple      Live Births  2            Home Medications    Prior to Admission medications   Medication Sig Start Date End Date Taking? Authorizing Provider  amoxicillin (AMOXIL) 500 MG capsule Take 1 capsule (500 mg total) by mouth 3 (three) times daily. 03/18/22  Yes Francene Finders, PA-C  albuterol (VENTOLIN HFA) 108 (90 Base) MCG/ACT inhaler Inhale 1-2 puffs into the lungs every 6 (six) hours as needed for wheezing or shortness of breath. Patient not taking: Reported on 02/26/2022 05/10/21   Teodora Medici, FNP  amLODipine (NORVASC) 10 MG tablet Take 10 mg by mouth daily. 09/07/18   [provider]  Cyanocobalamin (B-12) 1000 MCG SUBL Place 1,000 mcg under the tongue daily. 03/12/20   Brunetta Genera, MD  cycloSPORINE (RESTASIS) 0.05 % ophthalmic emulsion Place 1 drop into both eyes 2 (two) times daily as needed (dry eyes).    [provider]  ergocalciferol (VITAMIN D2) 1.25 MG (50000 UT) capsule Take 50,000 Units by mouth every Thursday.    [provider]  gabapentin (NEURONTIN) 300 MG capsule Take 300 mg by mouth at bedtime. 01/30/22   [provider]   HYDROcodone-acetaminophen (NORCO/VICODIN) 5-325 MG tablet Take 1-2 tablets by mouth every 6 (six) hours as needed for moderate pain. Patient not taking: Reported on 02/26/2022 07/30/21   Bovard-Stuckert, Jeral Fruit, MD  ibuprofen (ADVIL) 800 MG tablet Take 1 tablet (800 mg total) by mouth every 8 (eight) hours as needed for moderate pain (mild pain). 07/30/21   Bovard-Stuckert, Jeral Fruit, MD  Multiple Vitamin (MULTIVITAMIN WITH MINERALS) TABS tablet Take 1 tablet by mouth daily.    [provider]  omeprazole (PRILOSEC) 20 MG capsule Take 20 mg by mouth daily as needed (acid reflux).     [provider]  potassium chloride SA (KLOR-CON M) 20 MEQ tablet Take 20 mEq by mouth daily. X 5 days  Patient not taking: Reported on 02/26/2022    Janyth Contes, MD    Family History Family History  Problem Relation Age of Onset   Hypertension Mother    Heart disease Father    Stroke Father    Other Neg Hx     Social History Social History   Tobacco Use   Smoking status: Former    Packs/day: 0.25    Years: 1.00    Total pack years: 0.25    Types: Cigarettes    Quit date: 2014    Years since quitting: 9.7   Smokeless tobacco: Never  Vaping Use   Vaping Use: Never used  Substance Use Topics   Alcohol use: No   Drug use: No     Allergies   Percocet [oxycodone-acetaminophen]   Review of Systems Review of Systems  Constitutional:  Negative for chills and fever.  HENT:  Positive for sore throat. Negative for congestion and ear pain.   Eyes:  Negative for discharge and redness.  Respiratory:  Negative for cough, shortness of breath and wheezing.   Gastrointestinal:  Negative for abdominal pain, diarrhea, nausea and vomiting.  Genitourinary:  Negative for genital sores and vaginal discharge.     Physical Exam Triage Vital Signs ED Triage Vitals [03/18/22 1052]  Enc Vitals Group     BP      Pulse      Resp      Temp      Temp src      SpO2      Weight      Height       Head Circumference      Peak Flow      Pain Score 3     Pain Loc      Pain Edu?      Excl. in [place]?    No data found.  Updated Vital Signs BP 135/87   Pulse 96   Temp 98.3 F (36.8 C)   Resp 18   SpO2 97%      Physical Exam Vitals and nursing note reviewed.  Constitutional:      General: She is not in acute distress.    Appearance: Normal appearance. She is not ill-appearing.  HENT:     Head: Normocephalic and atraumatic.     Right Ear: Tympanic membrane normal.     Left Ear: Tympanic membrane normal.     Nose: Congestion present.     Mouth/Throat:     Mouth: Mucous membranes are moist.     Pharynx: No oropharyngeal exudate or posterior oropharyngeal erythema.  Eyes:     Conjunctiva/sclera: Conjunctivae normal.  Cardiovascular:     Rate and Rhythm: Normal rate and regular rhythm.     Heart sounds: Normal heart sounds. No murmur heard. Pulmonary:     Effort: Pulmonary effort is normal. No respiratory distress.     Breath sounds: Normal breath sounds. No wheezing, rhonchi or rales.  Skin:    General: Skin is warm and dry.  Neurological:     Mental Status: She is alert.  Psychiatric:        Mood and Affect: Mood normal.        Thought Content: Thought content normal.      UC Treatments / Results  Labs (all labs ordered are listed, but only abnormal results are displayed) Labs Reviewed  POCT RAPID STREP A (OFFICE) - Abnormal; Notable for the following components:      Result Value  Rapid Strep A Screen Positive (*)    All other components within normal limits  SARS CORONAVIRUS 2 (TAT 6-24 HRS)  HIV ANTIBODY (ROUTINE TESTING W REFLEX)  HEPATITIS PANEL, ACUTE  RPR  CERVICOVAGINAL ANCILLARY ONLY    EKG   Radiology No results found.  Procedures Procedures (including critical care time)  Medications Ordered in UC Medications - No data to display  Initial Impression / Assessment and Plan / UC Course  I have reviewed the triage vital signs and  the nursing notes.  Pertinent labs & imaging results that were available during my care of the patient were reviewed by me and considered in my medical decision making (see chart for details).    Strep test positive- notified patient of same. Amoxicillin prescribed. Will screen for covid given current outbreak as well as STDs as requested. Encouraged follow up with any further concerns.   Final Clinical Impressions(s) / UC Diagnoses   Final diagnoses:  Encounter for screening for COVID-19  Screening for STD (sexually transmitted disease)  Acute pharyngitis, unspecified etiology   Discharge Instructions   None    ED Prescriptions     Medication Sig Dispense Auth. Provider   amoxicillin (AMOXIL) 500 MG capsule Take 1 capsule (500 mg total) by mouth 3 (three) times daily. 21 capsule Francene Finders, PA-C      PDMP not reviewed this encounter.   Francene Finders, PA-C 03/18/22 1224

## 2022-03-19 ENCOUNTER — Telehealth (HOSPITAL_COMMUNITY): Payer: Self-pay | Admitting: Emergency Medicine

## 2022-03-19 LAB — SARS CORONAVIRUS 2 (TAT 6-24 HRS): SARS Coronavirus 2: NEGATIVE

## 2022-03-19 LAB — CERVICOVAGINAL ANCILLARY ONLY
Bacterial Vaginitis (gardnerella): POSITIVE — AB
Candida Glabrata: NEGATIVE
Candida Vaginitis: POSITIVE — AB
Chlamydia: NEGATIVE
Comment: NEGATIVE
Comment: NEGATIVE
Comment: NEGATIVE
Comment: NEGATIVE
Comment: NEGATIVE
Comment: NORMAL
Neisseria Gonorrhea: NEGATIVE
Trichomonas: NEGATIVE

## 2022-03-19 MED ORDER — METRONIDAZOLE 0.75 % VA GEL: 1.0000 | Freq: Every day | VAGINAL | 0 refills | Status: AC

## 2022-03-19 MED ORDER — FLUCONAZOLE 150 MG PO TABS: 150.0000 mg | ORAL_TABLET | Freq: Once | ORAL | 0 refills | Status: AC

## 2022-03-20 ENCOUNTER — Telehealth (HOSPITAL_COMMUNITY): Payer: Self-pay | Admitting: Emergency Medicine

## 2022-03-20 LAB — RPR: RPR Ser Ql: NONREACTIVE

## 2022-03-20 LAB — HIV ANTIBODY (ROUTINE TESTING W REFLEX): HIV Screen 4th Generation wRfx: NONREACTIVE

## 2022-03-20 MED ORDER — AMOXICILLIN 250 MG/5ML PO SUSR: 500.0000 mg | Freq: Two times a day (BID) | ORAL | 0 refills | Status: AC

## 2022-03-20 NOTE — Telephone Encounter (Signed)
Patient called and states she is having difficulty swallowing the Amoxicillin pills prescribed for her positive strep.   Reviewed with Junie Panning, APP who okay'd sending liquid.  Reviewed with patient, verified pharmacy, prescription sent ER precautions reviewed

## 2022-03-21 ENCOUNTER — Emergency Department (HOSPITAL_BASED_OUTPATIENT_CLINIC_OR_DEPARTMENT_OTHER)
Admission: EM | Admit: 2022-03-21 | Discharge: 2022-03-21 | Disposition: A | Discharge status: Home / Self Care | Payer: Medicaid Other | Attending: Emergency Medicine | Admitting: Emergency Medicine

## 2022-03-21 ENCOUNTER — Other Ambulatory Visit: Payer: Self-pay

## 2022-03-21 ENCOUNTER — Encounter (HOSPITAL_BASED_OUTPATIENT_CLINIC_OR_DEPARTMENT_OTHER): Payer: Self-pay

## 2022-03-21 DIAGNOSIS — F458 Other somatoform disorders: Secondary | ICD-10-CM | POA: Diagnosis present

## 2022-03-21 DIAGNOSIS — R09A2 Foreign body sensation, throat: Secondary | ICD-10-CM

## 2022-03-21 LAB — ACUTE VIRAL HEPATITIS (HAV, HBV, HCV)
HCV Ab: NONREACTIVE
Hep A IgM: NEGATIVE
Hep B C IgM: NEGATIVE
Hepatitis B Surface Ag: NEGATIVE

## 2022-03-21 LAB — HCV INTERPRETATION

## 2022-03-21 LAB — SPECIMEN STATUS REPORT

## 2022-03-21 NOTE — ED Notes (Signed)
Crackers and juice provided to patient

## 2022-03-21 NOTE — ED Triage Notes (Signed)
Patient here POV from Home.  Endorses Last PM taking her Omeprazole and states it took a considerable time frame to go down and then began to have Irritation and Globus Sensation. Sensation is Intermittent. States since then she has been able to take other Medications well.   NAD Noted during Triage. A&Ox4. GCS 15. Ambulatory.

## 2022-03-21 NOTE — Discharge Instructions (Signed)
Watch for increased difficulty breathing or swallowing.  Follow-up with GI if symptoms do not improve.

## 2022-03-21 NOTE — ED Notes (Signed)
Pt tolerated crackers and juice

## 2022-03-22 NOTE — ED Provider Notes (Signed)
[facility] DEPT Provider Note   CSN: [id number] Arrival date & time: 03/21/22  1409     History  Chief Complaint  Patient presents with   Globus    [name] is a 36 y.o. female.  HPI Patient been diagnosed with strep throat about 4 days ago.  Had been on antibiotics.  No feels that there is some difficulty swallowing.  States she feels that food gets stuck in her lower neck.  No difficulty swallowing.  No difficulty moving her neck.  No cough.  No fevers.  States her throat is feeling better.  Does have a history of GERD and states she has taken omeprazole and sometimes feels if that would get stuck.    Home Medications Prior to Admission medications   Medication Sig Start Date End Date Taking? Authorizing Provider  albuterol (VENTOLIN HFA) 108 (90 Base) MCG/ACT inhaler Inhale 1-2 puffs into the lungs every 6 (six) hours as needed for wheezing or shortness of breath. Patient not taking: Reported on 02/26/2022 05/10/21   Teodora Medici, FNP  amLODipine (NORVASC) 10 MG tablet Take 10 mg by mouth daily. 09/07/18   [provider]  amoxicillin (AMOXIL) 250 MG/5ML suspension Take 10 mLs (500 mg total) by mouth 2 (two) times daily for 10 days. 03/20/22 03/30/22  Raspet, Derry Skill, PA-C  Cyanocobalamin (B-12) 1000 MCG SUBL Place 1,000 mcg under the tongue daily. 03/12/20   Brunetta Genera, MD  cycloSPORINE (RESTASIS) 0.05 % ophthalmic emulsion Place 1 drop into both eyes 2 (two) times daily as needed (dry eyes).    [provider]  ergocalciferol (VITAMIN D2) 1.25 MG (50000 UT) capsule Take 50,000 Units by mouth every Thursday.    [provider]  gabapentin (NEURONTIN) 300 MG capsule Take 300 mg by mouth at bedtime. 01/30/22   [provider]  HYDROcodone-acetaminophen (NORCO/VICODIN) 5-325 MG tablet Take 1-2 tablets by mouth every 6 (six) hours as needed for moderate pain. Patient not taking: Reported on 02/26/2022 07/30/21    Bovard-Stuckert, Jeral Fruit, MD  ibuprofen (ADVIL) 800 MG tablet Take 1 tablet (800 mg total) by mouth every 8 (eight) hours as needed for moderate pain (mild pain). 07/30/21   Bovard-Stuckert, Jeral Fruit, MD  metroNIDAZOLE (METROGEL VAGINAL) 0.75 % vaginal gel Place 1 Applicatorful vaginally at bedtime for 5 days. 03/19/22 03/24/22  Chase Picket, MD  Multiple Vitamin (MULTIVITAMIN WITH MINERALS) TABS tablet Take 1 tablet by mouth daily.    [provider]  omeprazole (PRILOSEC) 20 MG capsule Take 20 mg by mouth daily as needed (acid reflux).     [provider]  potassium chloride SA (KLOR-CON M) 20 MEQ tablet Take 20 mEq by mouth daily. X 5 days Patient not taking: Reported on 02/26/2022    Bovard-StuckertJeral Fruit, MD      Allergies    Percocet [oxycodone-acetaminophen]    Review of Systems   Review of Systems  Physical Exam Updated Vital Signs BP (!) 142/99 (BP Location: Right Arm)   Pulse 87   Temp 98.6 F (37 C)   Resp 15   Ht '5\' 5"'$  (1.651 m)   Wt (!) 141 kg   LMP 07/01/2021 (Exact Date)   SpO2 98%   BMI 51.73 kg/m  Physical Exam Vitals and nursing note reviewed.  HENT:     Head: Normocephalic.  Eyes:     Extraocular Movements: Extraocular movements intact.     Pupils: Pupils are equal, round, and reactive to light.  Cardiovascular:  Rate and Rhythm: Normal rate.  Pulmonary:     Breath sounds: Normal breath sounds.  Abdominal:     Tenderness: There is no abdominal tenderness.  Musculoskeletal:        General: No tenderness.     Cervical back: Neck supple. No tenderness.  Skin:    General: Skin is warm.     Capillary Refill: Capillary refill takes less than 2 seconds.  Neurological:     Mental Status: She is alert and oriented to person, place, and time.     ED Results / Procedures / Treatments   Labs (all labs ordered are listed, but only abnormal results are displayed) Labs Reviewed - No data to display  EKG None  Radiology No results  found.  Procedures Procedures    Medications Ordered in ED Medications - No data to display  ED Course/ Medical Decision Making/ A&P                           Medical Decision Making Patient with feeling of food getting caught in her throat.  Mid throat.  Also feels cough at time.  Recent strep.  Well-appearing.  Doubt peritonsillar abscess or severe swelling.  Candidiasis also felt less likely.  Appears stable for discharge home with outpatient follow-up.  Doubt severe mass either.         Final Clinical Impression(s) / ED Diagnoses Final diagnoses:  Globus sensation    Rx / DC Orders ED Discharge Orders     None         Davonna Belling, MD 03/22/22 0020

## 2022-06-03 ENCOUNTER — Ambulatory Visit
Admission: EM | Admit: 2022-06-03 | Discharge: 2022-06-03 | Disposition: A | Discharge status: Home / Self Care | Payer: Medicaid Other | Attending: Emergency Medicine | Admitting: Emergency Medicine

## 2022-06-03 ENCOUNTER — Ambulatory Visit (INDEPENDENT_AMBULATORY_CARE_PROVIDER_SITE_OTHER): Payer: Medicaid Other

## 2022-06-03 DIAGNOSIS — Z9109 Other allergy status, other than to drugs and biological substances: Secondary | ICD-10-CM | POA: Diagnosis not present

## 2022-06-03 DIAGNOSIS — J22 Unspecified acute lower respiratory infection: Secondary | ICD-10-CM | POA: Diagnosis not present

## 2022-06-03 DIAGNOSIS — J309 Allergic rhinitis, unspecified: Secondary | ICD-10-CM | POA: Diagnosis not present

## 2022-06-03 MED ORDER — FLUTICASONE PROPIONATE 50 MCG/ACT NA SUSP: 1.0000 | Freq: Every day | NASAL | 2 refills | Status: AC

## 2022-06-03 MED ORDER — AMOXICILLIN-POT CLAVULANATE 875-125 MG PO TABS: 1.0000 | ORAL_TABLET | Freq: Two times a day (BID) | ORAL | 0 refills | Status: AC

## 2022-06-03 MED ORDER — CETIRIZINE HCL 10 MG PO TABS: 10.0000 mg | ORAL_TABLET | Freq: Every day | ORAL | 1 refills | Status: AC

## 2022-06-03 MED ORDER — AZITHROMYCIN 250 MG PO TABS: ORAL_TABLET | ORAL | 0 refills | Status: AC

## 2022-06-03 MED ORDER — GUAIFENESIN 400 MG PO TABS: ORAL_TABLET | ORAL | 0 refills | Status: AC

## 2022-06-03 MED ORDER — PROMETHAZINE-DM 6.25-15 MG/5ML PO SYRP: 5.0000 mL | ORAL_SOLUTION | Freq: Four times a day (QID) | ORAL | 0 refills | Status: DC | PRN

## 2022-06-03 NOTE — Discharge Instructions (Addendum)
Per my personal interpretation of your chest x-ray, I believe that you are suffering from a lower respiratory tract infection that requires antibiotics.  I have sent a prescription for Augmentin and azithromycin to your pharmacy that would like for you to begin taking this evening if at all possible.  My physical exam findings are also concerning for respiratory allergies.  I recommended to begin allergy medication at this time.  Please read below to learn more about the medications, dosages and frequencies that I recommend to help alleviate your symptoms and to get you feeling better soon:   Augmentin (amoxicillin - clavulanic acid):  take 1 tablet twice daily for 5 days, you can take it with or without food.  This antibiotic can cause upset stomach, this will resolve once antibiotics are complete.  You are welcome to use a probiotic, eat yogurt, take Imodium while taking this medication.  Please avoid other systemic medications such as Maalox, Pepto-Bismol or milk of magnesia as they can interfere with your body's ability to absorb the antibiotics.  Z-Pak (azithromycin):  take 2 tablets the first day, then take 1 tablet daily every day thereafter until complete.       Zyrtec (cetirizine): This is an excellent second-generation antihistamine that helps to reduce respiratory inflammatory response to environmental allergens.  In some patients, this medication can cause daytime sleepiness so I recommend that you take 1 tablet daily at bedtime.     Flonase (fluticasone): This is a steroid nasal spray that you use once daily, 1 spray in each nare.  This medication does not work well if you decide to use it only used as you feel you need to, it works best used on a daily basis.  After 3 to 5 days of use, you will notice significant reduction of the inflammation and mucus production that is currently being caused by exposure to allergens, whether seasonal or environmental.  The most common side effect of this  medication is nosebleeds.  If you experience a nosebleed, please discontinue use for 1 week, then feel free to resume.  I have provided you with a prescription.     ProAir, Ventolin, Proventil (albuterol): This inhaled medication contains a short acting beta agonist bronchodilator.  This medication works on the smooth muscle that opens and constricts of your airways by relaxing the muscle.  The result of relaxation of the smooth muscle is increased air movement and improved work of breathing.  This is a short acting medication that can be used every 4-6 hours as needed for increased work of breathing, shortness of breath, wheezing and excessive coughing.  I have provided you with a prescription.    Robitussin, Mucinex (guaifenesin): This is an expectorant.  This helps break up chest congestion and loosen up thick nasal drainage making phlegm and drainage more liquid and therefore easier to remove.  I recommend being 400 mg three times daily as needed.      Promethazine DM: Promethazine is both a nasal decongestant and an antinausea medication that makes most patients feel fairly sleepy.  The DM is dextromethorphan, a cough suppressant found in many over-the-counter cough medications.  Please take 5 mL before bedtime to minimize your cough which will help you sleep better.  I have sent a prescription for this medication to your pharmacy.   Please follow-up within the next 5-7 days either with your primary care provider or urgent care if your symptoms do not resolve.  If you do not have a primary care  provider, we will assist you in finding one.        Thank you for visiting urgent care today.  We appreciate the opportunity to participate in your care.

## 2022-06-03 NOTE — ED Triage Notes (Signed)
Pt reports having chest congestion, productive cough, post nasal drip, and headache.   Started: last Thursday   Home interventions: hot tea

## 2022-06-03 NOTE — ED Provider Notes (Signed)
[facility]    CSN: [id number] Arrival date & time: 06/03/22  1605    HISTORY   Chief Complaint  Patient presents with   Nasal Congestion   Cough   Headache   HPI [name] is a pleasant, 36 y.o. female who presents to urgent care today. Patient reports sore throat, chest congestion, cough productive of small amounts of clear sputum, postnasal drip, nasal congestion, rhinorrhea and mild, global headache for the past 5 days.  Patient states she has been drinking hot tea without relief of her symptoms.  Patient denies history of allergies and asthma, after reviewing her chart I do see that she has been prescribed albuterol in the past when she was diagnosed with a viral respiratory infection in December 2022.    Past Medical History:  Diagnosis Date   Abnormal Pap smear    colposcopy, mild dysplasia, HPV   Acid reflux    Anemia    Anxiety    on meds- stable   Bipolar disorder (HCC)    Bronchitis    Chlamydia    COVID-19 04/2020   sore throat, fever, SOB, runny nose, headache. Pt was hospitalized due to low oxygen levels.   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 Per 07/17/21 phone call with patient, she wears a CPAP nightly.   Trichomonas    Urinary tract infection    Uterine leiomyoma 05/01/2020   Wears glasses    Patient Active Problem List   Diagnosis Date Noted   S/P abdominal supracervical subtotal hysterectomy 07/30/2021   S/P total abdominal hysterectomy 07/29/2021   S/P TAH (total abdominal hysterectomy)  07/29/2021   Abnormal uterine bleeding (AUB) 07/03/2021   S/P myomectomy 08/06/2020   Uterine fibroid 08/06/2020   Uterine leiomyoma 05/01/2020   Acute hypoxemic respiratory failure due to COVID-19 ([place]) 04/20/2020   Iron deficiency anemia 03/06/2020   Incarcerated incisional hernia s/p lap repair 12/02/2012 12/03/2012   Hypertension    Acid reflux    Anxiety    Obesity, Class III, BMI 40-49.9 (morbid obesity) ([place])    Sleep apnea    Chronic cholecystitis with calculus s/p lap chole [id number] 10/04/2012   Past Surgical History:  Procedure Laterality Date   ABDOMINAL SURGERY     CESAREAN SECTION  05/02/03,05/04/08,10/23/10   x3   CHOLECYSTECTOMY N/A 10/07/2012   Procedure: LAPAROSCOPIC CHOLECYSTECTOMY WITH INTRAOPERATIVE CHOLANGIOGRAM;  Surgeon: Imogene Burn. Georgette Dover, MD;  Location: [facility];  Service: General;  Laterality: N/A;   CYSTOSCOPY N/A 07/29/2021   Procedure: CYSTOSCOPY;  Surgeon: Janyth Contes, MD;  Location: [place];  Service: Gynecology;  Laterality: N/A;   CYSTOSCOPY  07/29/2021   Procedure: CYSTOSCOPY;  Surgeon: Janyth Contes, MD;  Location: [facility];  Service: Gynecology;;   ESOPHAGOGASTRODUODENOSCOPY (EGD) WITH PROPOFOL N/A 10/08/2018   Procedure: ESOPHAGOGASTRODUODENOSCOPY (EGD) WITH PROPOFOL;  Surgeon: Carol Ada, MD;  Location: [facility];  Service: Endoscopy;  Laterality: N/A;  FOOT SURGERY  1995   INSERTION OF MESH N/A 12/02/2012   Procedure: INSERTION OF MESH;  Surgeon: Adin Hector, MD;  Location: [facility];  Service: General;  Laterality: N/A;   LAPAROSCOPIC VAGINAL HYSTERECTOMY WITH SALPINGECTOMY Bilateral 07/29/2021   Procedure: ATTEMPTED LAPAROSCOPIC ASSISTED VAGINAL HYSTERECTOMY WITH SALPINGECTOMY;  Surgeon: Janyth Contes, MD;  Location: [place];  Service: Gynecology;  Laterality: Bilateral;   MYOMECTOMY N/A 08/06/2020   Procedure: ABDOMINAL MYOMECTOMY;  Surgeon: Janyth Contes, MD;  Location: [place];  Service: Gynecology;  Laterality: N/A;  TAP BLOCK and general   SUPRACERVICAL ABDOMINAL HYSTERECTOMY N/A 07/29/2021   Procedure: HYSTERECTOMY SUPRACERVICAL ABDOMINAL;  Surgeon: Janyth Contes, MD;  Location: [place];  Service: Gynecology;  Laterality: N/A;   TUBAL LIGATION  8338   UMBILICAL HERNIA REPAIR N/A 12/02/2012   Procedure: LAPAROSCOPIC UMBILICAL HERNIA;  Surgeon: Adin Hector, MD;  Location: [facility];  Service: General;  Laterality: N/A;   WISDOM TOOTH EXTRACTION  2001   OB History     Gravida  3   Para  3   Term  3   Preterm      AB      Living  3      SAB      IAB      Ectopic      Multiple      Live Births  2          Home Medications    Prior to Admission medications   Medication Sig Start Date End Date Taking? Authorizing Provider  albuterol (VENTOLIN HFA) 108 (90 Base) MCG/ACT inhaler Inhale 1-2 puffs into the lungs every 6 (six) hours as needed for wheezing or shortness of breath. Patient not taking: Reported on 02/26/2022 05/10/21   Teodora Medici, FNP  amLODipine (NORVASC) 10 MG tablet Take 10 mg by mouth daily. 09/07/18   [provider]  Cyanocobalamin (B-12) 1000 MCG SUBL Place 1,000 mcg under the tongue daily. 03/12/20   Brunetta Genera, MD  cycloSPORINE (RESTASIS) 0.05 % ophthalmic emulsion Place 1 drop into both eyes 2 (two) times daily as needed (dry eyes).    [provider]  ergocalciferol (VITAMIN D2) 1.25 MG (50000 UT) capsule Take 50,000 Units by mouth every Thursday.    [provider]  gabapentin (NEURONTIN) 300 MG capsule Take 300 mg by mouth at bedtime. 01/30/22   [provider]  HYDROcodone-acetaminophen (NORCO/VICODIN) 5-325 MG tablet Take 1-2 tablets by mouth every 6 (six) hours as needed for moderate pain. Patient not taking: Reported on 02/26/2022 07/30/21   Bovard-Stuckert, Jeral Fruit, MD  ibuprofen (ADVIL) 800 MG tablet Take 1 tablet (800 mg  total) by mouth every 8 (eight) hours as needed for moderate pain (mild pain). 07/30/21   Bovard-Stuckert, Jeral Fruit, MD  Multiple Vitamin (MULTIVITAMIN WITH MINERALS) TABS tablet Take 1 tablet by mouth daily.    [provider]  omeprazole (PRILOSEC) 20 MG capsule Take 20 mg by mouth daily as needed (acid reflux).     [provider]  potassium chloride SA (KLOR-CON M) 20 MEQ tablet Take 20 mEq by mouth daily. X 5 days Patient not taking: Reported on 02/26/2022    Janyth Contes, MD    Family History Family History  Problem Relation Age of Onset   Hypertension Mother    Heart disease Father    Stroke Father    Other Neg Hx    Social History Social History   Tobacco Use  Smoking status: Former    Packs/day: 0.25    Years: 1.00    Total pack years: 0.25    Types: Cigarettes    Quit date: 2014    Years since quitting: 9.9   Smokeless tobacco: Never  Vaping Use   Vaping Use: Never used  Substance Use Topics   Alcohol use: No   Drug use: No   Allergies   Percocet [oxycodone-acetaminophen]  Review of Systems Review of Systems Pertinent findings revealed after performing a 14 point review of systems has been noted in the history of present illness.  Physical Exam Triage Vital Signs ED Triage Vitals  Enc Vitals Group     BP 04/05/21 0827 (!) 147/82     Pulse Rate 04/05/21 0827 72     Resp 04/05/21 0827 18     Temp 04/05/21 0827 98.3 F (36.8 C)     Temp Source 04/05/21 0827 Oral     SpO2 04/05/21 0827 98 %     Weight --      Height --      Head Circumference --      Peak Flow --      Pain Score 04/05/21 0826 5     Pain Loc --      Pain Edu? --      Excl. in [place]? --   No data found.  Updated Vital Signs BP (!) 128/95 (BP Location: Left Arm)   Pulse 90   Temp 98.3 F (36.8 C) (Oral)   Resp 18   LMP 07/01/2021 (Exact Date)   SpO2 98%   Physical Exam Vitals and nursing note reviewed.  Constitutional:      General: She is not in acute  distress.    Appearance: Normal appearance. She is not ill-appearing.  HENT:     Head: Normocephalic and atraumatic.     Salivary Glands: Right salivary gland is not diffusely enlarged or tender. Left salivary gland is not diffusely enlarged or tender.     Right Ear: Ear canal and external ear normal. No drainage. A middle ear effusion is present. There is no impacted cerumen. Tympanic membrane is bulging. Tympanic membrane is not injected or erythematous.     Left Ear: Ear canal and external ear normal. No drainage. A middle ear effusion is present. There is no impacted cerumen. Tympanic membrane is bulging. Tympanic membrane is not injected or erythematous.     Ears:     Comments: Bilateral EACs normal, both TMs bulging with clear fluid    Nose: Rhinorrhea present. No nasal deformity, septal deviation, signs of injury, nasal tenderness, mucosal edema or congestion. Rhinorrhea is clear.     Right Nostril: Occlusion present. No foreign body, epistaxis or septal hematoma.     Left Nostril: Occlusion present. No foreign body, epistaxis or septal hematoma.     Right Turbinates: Enlarged, swollen and pale.     Left Turbinates: Enlarged, swollen and pale.     Right Sinus: No maxillary sinus tenderness or frontal sinus tenderness.     Left Sinus: No maxillary sinus tenderness or frontal sinus tenderness.     Mouth/Throat:     Lips: Pink. No lesions.     Mouth: Mucous membranes are moist. No oral lesions.     Pharynx: Oropharynx is clear. Uvula midline. No posterior oropharyngeal erythema or uvula swelling.     Tonsils: No tonsillar exudate. 0 on the right. 0 on the left.     Comments: Postnasal drip Eyes:  General: Lids are normal.        Right eye: No discharge.        Left eye: No discharge.     Extraocular Movements: Extraocular movements intact.     Conjunctiva/sclera: Conjunctivae normal.     Right eye: Right conjunctiva is not injected.     Left eye: Left conjunctiva is not injected.   Neck:     Trachea: Trachea and phonation normal.  Cardiovascular:     Rate and Rhythm: Normal rate and regular rhythm.     Pulses: Normal pulses.     Heart sounds: Normal heart sounds. No murmur heard.    No friction rub. No gallop.  Pulmonary:     Effort: Pulmonary effort is normal. No tachypnea, bradypnea, accessory muscle usage, prolonged expiration, respiratory distress or retractions.     Breath sounds: No stridor, decreased air movement or transmitted upper airway sounds. Examination of the right-lower field reveals decreased breath sounds. Decreased breath sounds present. No wheezing, rhonchi or rales.  Chest:     Chest wall: No tenderness.  Musculoskeletal:        General: Normal range of motion.     Cervical back: Normal range of motion and neck supple. Normal range of motion.  Lymphadenopathy:     Cervical: No cervical adenopathy.  Skin:    General: Skin is warm and dry.     Findings: No erythema or rash.  Neurological:     General: No focal deficit present.     Mental Status: She is alert and oriented to person, place, and time.  Psychiatric:        Mood and Affect: Mood normal.        Behavior: Behavior normal.     Visual Acuity Right Eye Distance:   Left Eye Distance:   Bilateral Distance:    Right Eye Near:   Left Eye Near:    Bilateral Near:     [facility] / Diagnostics / Procedures:     Radiology DG Chest 2 View  Result Date: 06/03/2022 CLINICAL DATA:  Corrective cough, chest congestion EXAM: CHEST - 2 VIEW COMPARISON:  05/10/2021 FINDINGS: The heart size and mediastinal contours are within normal limits. Both lungs are clear. The visualized skeletal structures are unremarkable. IMPRESSION: No active cardiopulmonary disease. Electronically Signed   By: Fidela Salisbury M.D.   On: 06/03/2022 19:20    Procedures Procedures (including critical care time) EKG  Pending results:  Labs Reviewed - No data to display  Medications Ordered in UC: Medications  - No data to display  UC Diagnoses / Final Clinical Impressions(s)   I have reviewed the triage vital signs and the nursing notes.  Pertinent labs & imaging results that were available during my care of the patient were reviewed by me and considered in my medical decision making (see chart for details).    Final diagnoses:  Acute lower respiratory infection  Allergic rhinitis, unspecified seasonality, unspecified trigger  Environmental allergies   Patient advised of x-ray findings.  Per my personal read of her chest x-ray and as it correlates with my physical exam findings as well as the history that she provided to me today, I believe patient would benefit from empiric treatment for presumed bacterial lower respiratory tract infection with Augmentin and azithromycin.  Patient also advised that I recommend that she continue allergy medication as she has been recommended to do so in the past.  Guaifenesin recommended to help promote sputum production Promethazine DM recommended  for nighttime cough.  Return precautions advised. Please see discharge instructions below for further details of plan of care as provided to patient. ED Prescriptions     Medication Sig Dispense Auth. Provider   amoxicillin-clavulanate (AUGMENTIN) 875-125 MG tablet Take 1 tablet by mouth 2 (two) times daily for 5 days. 10 tablet Lynden Oxford Scales, PA-C   azithromycin (ZITHROMAX) 250 MG tablet Take 2 tablets (500 mg total) by mouth daily for 1 day, THEN 1 tablet (250 mg total) daily for 4 days. 6 tablet Lynden Oxford Scales, PA-C   cetirizine (ZYRTEC ALLERGY) 10 MG tablet Take 1 tablet (10 mg total) by mouth at bedtime. 90 tablet Lynden Oxford Scales, PA-C   fluticasone (FLONASE) 50 MCG/ACT nasal spray Place 1 spray into both nostrils daily. Begin by using 2 sprays in each nare daily for 3 to 5 days, then decrease to 1 spray in each nare daily. 15.8 mL Lynden Oxford Scales, PA-C   guaifenesin (HUMIBID E) 400 MG  TABS tablet Take 1 tablet 3 times daily as needed for chest congestion and cough 21 tablet Lynden Oxford Scales, PA-C   promethazine-dextromethorphan (PROMETHAZINE-DM) 6.25-15 MG/5ML syrup Take 5 mLs by mouth 4 (four) times daily as needed for cough. 118 mL Lynden Oxford Scales, PA-C      PDMP not reviewed this encounter.  Disposition Upon Discharge:  Condition: stable for discharge home Home: take medications as prescribed; routine discharge instructions as discussed; follow up as advised.  Patient presented with an acute illness with associated systemic symptoms and significant discomfort requiring urgent management. In my opinion, this is a condition that a prudent lay person (someone who possesses an average knowledge of health and medicine) may potentially expect to result in complications if not addressed urgently such as respiratory distress, impairment of bodily function or dysfunction of bodily organs.   Routine symptom specific, illness specific and/or disease specific instructions were discussed with the patient and/or caregiver at length.   As such, the patient has been evaluated and assessed, work-up was performed and treatment was provided in alignment with urgent care protocols and evidence based medicine.  Patient/parent/caregiver has been advised that the patient may require follow up for further testing and treatment if the symptoms continue in spite of treatment, as clinically indicated and appropriate.  If the patient was tested for COVID-19, Influenza and/or RSV, then the patient/parent/guardian was advised to isolate at home pending the results of his/her diagnostic coronavirus test and potentially longer if they're positive. I have also advised pt that if his/her COVID-19 test returns positive, it's recommended to self-isolate for at least 10 days after symptoms first appeared AND until fever-free for 24 hours without fever reducer AND other symptoms have improved or  resolved. Discussed self-isolation recommendations as well as instructions for household member/close contacts as per the [facility] and [place] DHHS, and also gave patient the [place] packet with this information.  Patient/parent/caregiver has been advised to return to the [facility] or PCP in 3-5 days if no better; to PCP or the Emergency Department if new signs and symptoms develop, or if the current signs or symptoms continue to change or worsen for further workup, evaluation and treatment as clinically indicated and appropriate  The patient will follow up with their current PCP if and as advised. If the patient does not currently have a PCP we will assist them in obtaining one.   The patient may need specialty follow up if the symptoms continue, in spite of conservative treatment and management, for  further workup, evaluation, consultation and treatment as clinically indicated and appropriate.  Patient/parent/caregiver verbalized understanding and agreement of plan as discussed.  All questions were addressed during visit.  Please see discharge instructions below for further details of plan.  Discharge Instructions:   Discharge Instructions      Per my personal interpretation of your chest x-ray, I believe that you are suffering from a lower respiratory tract infection that requires antibiotics.  I have sent a prescription for Augmentin and azithromycin to your pharmacy that would like for you to begin taking this evening if at all possible.  My physical exam findings are also concerning for respiratory allergies.  I recommended to begin allergy medication at this time.  Please read below to learn more about the medications, dosages and frequencies that I recommend to help alleviate your symptoms and to get you feeling better soon:   Augmentin (amoxicillin - clavulanic acid):  take 1 tablet twice daily for 5 days, you can take it with or without food.  This antibiotic can cause upset stomach, this will resolve  once antibiotics are complete.  You are welcome to use a probiotic, eat yogurt, take Imodium while taking this medication.  Please avoid other systemic medications such as Maalox, Pepto-Bismol or milk of magnesia as they can interfere with your body's ability to absorb the antibiotics.  Z-Pak (azithromycin):  take 2 tablets the first day, then take 1 tablet daily every day thereafter until complete.       Zyrtec (cetirizine): This is an excellent second-generation antihistamine that helps to reduce respiratory inflammatory response to environmental allergens.  In some patients, this medication can cause daytime sleepiness so I recommend that you take 1 tablet daily at bedtime.     Flonase (fluticasone): This is a steroid nasal spray that you use once daily, 1 spray in each nare.  This medication does not work well if you decide to use it only used as you feel you need to, it works best used on a daily basis.  After 3 to 5 days of use, you will notice significant reduction of the inflammation and mucus production that is currently being caused by exposure to allergens, whether seasonal or environmental.  The most common side effect of this medication is nosebleeds.  If you experience a nosebleed, please discontinue use for 1 week, then feel free to resume.  I have provided you with a prescription.     ProAir, Ventolin, Proventil (albuterol): This inhaled medication contains a short acting beta agonist bronchodilator.  This medication works on the smooth muscle that opens and constricts of your airways by relaxing the muscle.  The result of relaxation of the smooth muscle is increased air movement and improved work of breathing.  This is a short acting medication that can be used every 4-6 hours as needed for increased work of breathing, shortness of breath, wheezing and excessive coughing.  I have provided you with a prescription.    Robitussin, Mucinex (guaifenesin): This is an expectorant.  This helps  break up chest congestion and loosen up thick nasal drainage making phlegm and drainage more liquid and therefore easier to remove.  I recommend being 400 mg three times daily as needed.      Promethazine DM: Promethazine is both a nasal decongestant and an antinausea medication that makes most patients feel fairly sleepy.  The DM is dextromethorphan, a cough suppressant found in many over-the-counter cough medications.  Please take 5 mL before bedtime to minimize your  cough which will help you sleep better.  I have sent a prescription for this medication to your pharmacy.   Please follow-up within the next 5-7 days either with your primary care provider or urgent care if your symptoms do not resolve.  If you do not have a primary care provider, we will assist you in finding one.        Thank you for visiting urgent care today.  We appreciate the opportunity to participate in your care.       This office note has been dictated using Museum/gallery curator.  Unfortunately, this method of dictation can sometimes lead to typographical or grammatical errors.  I apologize for your inconvenience in advance if this occurs.  Please do not hesitate to reach out to me if clarification is needed.      Lynden Oxford Scales, PA-C 06/05/22 1420

## 2022-06-13 ENCOUNTER — Encounter: Payer: Self-pay | Admitting: Emergency Medicine

## 2022-06-13 ENCOUNTER — Ambulatory Visit
Admission: EM | Admit: 2022-06-13 | Discharge: 2022-06-13 | Disposition: A | Discharge status: Home / Self Care | Payer: Medicaid Other | Attending: Physician Assistant | Admitting: Physician Assistant

## 2022-06-13 DIAGNOSIS — Z113 Encounter for screening for infections with a predominantly sexual mode of transmission: Secondary | ICD-10-CM | POA: Diagnosis present

## 2022-06-13 NOTE — ED Provider Notes (Signed)
[name]    CSN: [id number] Arrival date & time: 06/13/22  0908      History   Chief Complaint Chief Complaint  Patient presents with   STI testing    HPI [name] is a 37 y.o. female.   37 year old female presents with STI testing.  Patient indicates that she is status post hysterectomy in 2023.  Patient indicates that she had unprotected intercourse on 06/08/2022.  Patient indicates that she is concerned and desires to have STI testing to include HIV and RPR.  Patient indicates that she is not having any symptoms of vaginal discharge.  She indicates she is not having any urinary symptoms such as frequency, urgency, or dysuria.  Patient denies having any back pain, fever, or chills.     Past Medical History:  Diagnosis Date   Abnormal Pap smear    colposcopy, mild dysplasia, HPV   Acid reflux    Anemia    Anxiety    on meds- stable   Bipolar disorder (HCC)    Bronchitis    Chlamydia    COVID-19 04/2020   sore throat, fever, SOB, runny nose, headache. Pt was hospitalized due to low oxygen levels.   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 Per 07/17/21 phone call with patient, she wears a CPAP nightly.   Trichomonas    Urinary tract infection    Uterine leiomyoma 05/01/2020   Wears glasses     Patient Active Problem List   Diagnosis Date Noted   S/P abdominal supracervical subtotal hysterectomy 07/30/2021   S/P total abdominal hysterectomy 07/29/2021   S/P TAH (total abdominal hysterectomy) 07/29/2021    Abnormal uterine bleeding (AUB) 07/03/2021   S/P myomectomy 08/06/2020   Uterine fibroid 08/06/2020   Uterine leiomyoma 05/01/2020   Acute hypoxemic respiratory failure due to COVID-19 ([place]) 04/20/2020   Iron deficiency anemia 03/06/2020   Incarcerated incisional hernia s/p lap repair 12/02/2012 12/03/2012   Hypertension    Acid reflux    Anxiety    Obesity, Class III, BMI 40-49.9 (morbid obesity) ([place])    Sleep apnea    Chronic cholecystitis with calculus s/p lap chole [id number] 10/04/2012    Past Surgical History:  Procedure Laterality Date   ABDOMINAL SURGERY     CESAREAN SECTION  05/02/03,05/04/08,10/23/10   x3   CHOLECYSTECTOMY N/A 10/07/2012   Procedure: LAPAROSCOPIC CHOLECYSTECTOMY WITH INTRAOPERATIVE CHOLANGIOGRAM;  Surgeon: Imogene Burn. Georgette Dover, MD;  Location: [facility];  Service: General;  Laterality: N/A;   CYSTOSCOPY N/A 07/29/2021   Procedure: CYSTOSCOPY;  Surgeon: Janyth Contes, MD;  Location: [place];  Service: Gynecology;  Laterality: N/A;   CYSTOSCOPY  07/29/2021   Procedure: CYSTOSCOPY;  Surgeon: Janyth Contes, MD;  Location: [facility];  Service: Gynecology;;   ESOPHAGOGASTRODUODENOSCOPY (EGD) WITH PROPOFOL N/A 10/08/2018   Procedure: ESOPHAGOGASTRODUODENOSCOPY (EGD) WITH PROPOFOL;  Surgeon: Carol Ada, MD;  Location: [facility];  Service: Endoscopy;  Laterality:  N/A;   FOOT SURGERY  1995   INSERTION OF MESH N/A 12/02/2012   Procedure: INSERTION OF MESH;  Surgeon: Adin Hector, MD;  Location: [facility];  Service: General;  Laterality: N/A;   LAPAROSCOPIC VAGINAL HYSTERECTOMY WITH SALPINGECTOMY Bilateral 07/29/2021   Procedure: ATTEMPTED LAPAROSCOPIC ASSISTED VAGINAL HYSTERECTOMY WITH SALPINGECTOMY;  Surgeon: Janyth Contes, MD;  Location: [place];  Service: Gynecology;  Laterality: Bilateral;   MYOMECTOMY N/A 08/06/2020   Procedure: ABDOMINAL MYOMECTOMY;  Surgeon: Janyth Contes, MD;   Location: [place];  Service: Gynecology;  Laterality: N/A;  TAP BLOCK and general   SUPRACERVICAL ABDOMINAL HYSTERECTOMY N/A 07/29/2021   Procedure: HYSTERECTOMY SUPRACERVICAL ABDOMINAL;  Surgeon: Janyth Contes, MD;  Location: [place];  Service: Gynecology;  Laterality: N/A;   TUBAL LIGATION  4097   UMBILICAL HERNIA REPAIR N/A 12/02/2012   Procedure: LAPAROSCOPIC UMBILICAL HERNIA;  Surgeon: Adin Hector, MD;  Location: [facility];  Service: General;  Laterality: N/A;   WISDOM TOOTH EXTRACTION  2001    OB History     Gravida  3   Para  3   Term  3   Preterm      AB      Living  3      SAB      IAB      Ectopic      Multiple      Live Births  2            Home Medications    Prior to Admission medications   Medication Sig Start Date End Date Taking? Authorizing Provider  amLODipine (NORVASC) 10 MG tablet Take 10 mg by mouth daily. 09/07/18  Yes [provider]  cetirizine (ZYRTEC ALLERGY) 10 MG tablet Take 1 tablet (10 mg total) by mouth at bedtime. 06/03/22 11/30/22 Yes Lynden Oxford Scales, PA-C  Cyanocobalamin (B-12) 1000 MCG SUBL Place 1,000 mcg under the tongue daily. 03/12/20  Yes Brunetta Genera, MD  cycloSPORINE (RESTASIS) 0.05 % ophthalmic emulsion Place 1 drop into both eyes 2 (two) times daily as needed (dry eyes).   Yes [provider]  ergocalciferol (VITAMIN D2) 1.25 MG (50000 UT) capsule Take 50,000 Units by mouth every Thursday.   Yes [provider]  fluticasone (FLONASE) 50 MCG/ACT nasal spray Place 1 spray into both nostrils daily. Begin by using 2 sprays in each nare daily for 3 to 5 days, then decrease to 1 spray in each nare daily. 06/03/22  Yes Lynden Oxford Scales, PA-C  gabapentin (NEURONTIN) 300 MG capsule Take 300 mg by mouth at bedtime. 01/30/22  Yes [provider]  guaifenesin (HUMIBID E) 400 MG TABS tablet Take 1 tablet 3 times daily as needed for chest congestion and cough  06/03/22  Yes Lynden Oxford Scales, PA-C  ibuprofen (ADVIL) 800 MG tablet Take 1 tablet (800 mg total) by mouth every 8 (eight) hours as needed for moderate pain (mild pain). 07/30/21  Yes Bovard-Stuckert, Jody, MD  Multiple Vitamin (MULTIVITAMIN WITH MINERALS) TABS tablet Take 1 tablet by mouth daily.   Yes [provider]  omeprazole (PRILOSEC) 20 MG capsule Take 20 mg by mouth daily as needed (acid reflux).    Yes [provider]  promethazine-dextromethorphan (PROMETHAZINE-DM) 6.25-15 MG/5ML syrup Take 5 mLs by mouth 4 (four) times daily as needed for cough. 06/03/22  Yes Lynden Oxford Scales, PA-C    Family History Family History  Problem Relation Age of Onset   Hypertension Mother  Heart disease Father    Stroke Father    Other Neg Hx     Social History Social History   Tobacco Use   Smoking status: Former    Packs/day: 0.25    Years: 1.00    Total pack years: 0.25    Types: Cigarettes    Quit date: 2014    Years since quitting: 10.0   Smokeless tobacco: Never  Vaping Use   Vaping Use: Never used  Substance Use Topics   Alcohol use: No   Drug use: No     Allergies   Percocet [oxycodone-acetaminophen]   Review of Systems Review of Systems   Physical Exam Triage Vital Signs ED Triage Vitals  Enc Vitals Group     BP 06/13/22 0939 (!) 140/106     Pulse Rate 06/13/22 0939 79     Resp 06/13/22 0939 18     Temp 06/13/22 0939 98.8 F (37.1 C)     Temp Source 06/13/22 0939 Oral     SpO2 06/13/22 0939 98 %     Weight 06/13/22 0941 (!) 309 lb (140.2 kg)     Height 06/13/22 0941 '5\' 5"'$  (1.651 m)     Head Circumference --      Peak Flow --      Pain Score 06/13/22 0941 0     Pain Loc --      Pain Edu? --      Excl. in [place]? --    No data found.  Updated Vital Signs BP (!) 140/106 (BP Location: Left Arm)   Pulse 79   Temp 98.8 F (37.1 C) (Oral)   Resp 18   Ht '5\' 5"'$  (1.651 m)   Wt (!) 309 lb (140.2 kg)   LMP 07/01/2021 (Exact Date)    SpO2 98%   BMI 51.42 kg/m   Visual Acuity Right Eye Distance:   Left Eye Distance:   Bilateral Distance:    Right Eye Near:   Left Eye Near:    Bilateral Near:     Physical Exam Constitutional:      Appearance: Normal appearance.  Neurological:     Mental Status: She is alert.      UC Treatments / Results  Labs (all labs ordered are listed, but only abnormal results are displayed) Labs Reviewed  HIV ANTIBODY (ROUTINE TESTING W REFLEX)  RPR  CERVICOVAGINAL ANCILLARY ONLY    EKG   Radiology No results found.  Procedures Procedures (including critical care time)  Medications Ordered in UC Medications - No data to display  Initial Impression / Assessment and Plan / UC Course  I have reviewed the triage vital signs and the nursing notes.  Pertinent labs & imaging results that were available during my care of the patient were reviewed by me and considered in my medical decision making (see chart for details).    Plan: 1.  Screening for STI will be treated with the following: A.  Treatment may be considered depending on the results of STI testing. 2.  Advised to follow-up with PCP or return to urgent care as needed. Final Clinical Impressions(s) / UC Diagnoses   Final diagnoses:  Routine screening for STI (sexually transmitted infection)     Discharge Instructions      Lab testing will be completed in 48 hours.  If you do not get a call from this office in 48 hours that indicates the test is negative.  Log onto MyChart to view the test results when  they post in 48 hours.    ED Prescriptions   None    PDMP not reviewed this encounter.   Nyoka Lint, PA-C 06/13/22 [phone]

## 2022-06-13 NOTE — Discharge Instructions (Signed)
Lab testing will be completed in 48 hours.  If you do not get a call from this office in 48 hours that indicates the test is negative.  Log onto MyChart to view the test results when they post in 48 hours.

## 2022-06-13 NOTE — ED Triage Notes (Signed)
Patient requesting STI testing due to having unprotected sex on 06/08/22.  No specific sx's.

## 2022-06-14 LAB — HIV ANTIBODY (ROUTINE TESTING W REFLEX): HIV Screen 4th Generation wRfx: NONREACTIVE

## 2022-06-14 LAB — RPR: RPR Ser Ql: NONREACTIVE

## 2022-06-19 ENCOUNTER — Telehealth (HOSPITAL_COMMUNITY): Payer: Self-pay | Admitting: Emergency Medicine

## 2022-06-19 LAB — CERVICOVAGINAL ANCILLARY ONLY
Bacterial Vaginitis (gardnerella): POSITIVE — AB
Candida Glabrata: NEGATIVE
Candida Vaginitis: NEGATIVE
Chlamydia: NEGATIVE
Comment: NEGATIVE
Comment: NEGATIVE
Comment: NEGATIVE
Comment: NEGATIVE
Comment: NEGATIVE
Trichomonas: NEGATIVE

## 2022-06-19 MED ORDER — METRONIDAZOLE 0.75 % VA GEL: 1.0000 | Freq: Every day | VAGINAL | 0 refills | Status: AC

## 2022-07-26 ENCOUNTER — Other Ambulatory Visit: Payer: Self-pay

## 2022-07-26 ENCOUNTER — Encounter: Payer: Self-pay | Admitting: Emergency Medicine

## 2022-07-26 ENCOUNTER — Ambulatory Visit
Admission: EM | Admit: 2022-07-26 | Discharge: 2022-07-26 | Disposition: A | Discharge status: Home / Self Care | Payer: Medicaid Other | Attending: Physician Assistant | Admitting: Physician Assistant

## 2022-07-26 DIAGNOSIS — J209 Acute bronchitis, unspecified: Secondary | ICD-10-CM

## 2022-07-26 MED ORDER — PREDNISONE 20 MG PO TABS: 40.0000 mg | ORAL_TABLET | Freq: Every day | ORAL | 0 refills | Status: AC

## 2022-07-26 NOTE — ED Triage Notes (Signed)
Pt here for cough and nasal congestion x 5 days

## 2022-07-26 NOTE — ED Provider Notes (Signed)
[name]    CSN: [id number] Arrival date & time: 07/26/22  1148      History   Chief Complaint Chief Complaint  Patient presents with   Cough    HPI [name] is a 37 y.o. female.   Patient here today for evaluation of cough and nasal congestion she has had for 5 days.  She reports low-grade fever at times.  Tmax 99.4.  She also reports cough seems to be worsening.  She denies any vomiting or diarrhea.  She does not reports significant sore throat. She has tried OTC meds without significant improvement.   The history is provided by the patient.  Cough Associated symptoms: rhinorrhea   Associated symptoms: no chills, no eye discharge, no fever and no shortness of breath     Past Medical History:  Diagnosis Date   Abnormal Pap smear    colposcopy, mild dysplasia, HPV   Acid reflux    Anemia    Anxiety    on meds- stable   Bipolar disorder (HCC)    Bronchitis    Chlamydia    COVID-19 04/2020   sore throat, fever, SOB, runny nose, headache. Pt was hospitalized due to low oxygen levels.   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 Per 07/17/21 phone call with patient, she wears a CPAP nightly.   Trichomonas    Urinary tract infection    Uterine leiomyoma 05/01/2020   Wears glasses     Patient Active Problem List   Diagnosis Date Noted   S/P abdominal supracervical subtotal hysterectomy 07/30/2021   S/P total abdominal hysterectomy 07/29/2021   S/P TAH (total abdominal hysterectomy) 07/29/2021   Abnormal uterine  bleeding (AUB) 07/03/2021   S/P myomectomy 08/06/2020   Uterine fibroid 08/06/2020   Uterine leiomyoma 05/01/2020   Acute hypoxemic respiratory failure due to COVID-19 ([place]) 04/20/2020   Iron deficiency anemia 03/06/2020   Incarcerated incisional hernia s/p lap repair 12/02/2012 12/03/2012   Hypertension    Acid reflux    Anxiety    Obesity, Class III, BMI 40-49.9 (morbid obesity) ([place])    Sleep apnea    Chronic cholecystitis with calculus s/p lap chole [id number] 10/04/2012    Past Surgical History:  Procedure Laterality Date   ABDOMINAL SURGERY     CESAREAN SECTION  05/02/03,05/04/08,10/23/10   x3   CHOLECYSTECTOMY N/A 10/07/2012   Procedure: LAPAROSCOPIC CHOLECYSTECTOMY WITH INTRAOPERATIVE CHOLANGIOGRAM;  Surgeon: Imogene Burn. Georgette Dover, MD;  Location: [facility];  Service: General;  Laterality: N/A;   CYSTOSCOPY N/A 07/29/2021   Procedure: CYSTOSCOPY;  Surgeon: Janyth Contes, MD;  Location: [place];  Service: Gynecology;  Laterality: N/A;   CYSTOSCOPY  07/29/2021   Procedure: CYSTOSCOPY;  Surgeon: Janyth Contes, MD;  Location: [facility];  Service: Gynecology;;   ESOPHAGOGASTRODUODENOSCOPY (EGD) WITH PROPOFOL N/A 10/08/2018   Procedure: ESOPHAGOGASTRODUODENOSCOPY (EGD) WITH PROPOFOL;  Surgeon: Carol Ada, MD;  Location: [facility];  Service:  Endoscopy;  Laterality: N/A;   FOOT SURGERY  1995   INSERTION OF MESH N/A 12/02/2012   Procedure: INSERTION OF MESH;  Surgeon: Adin Hector, MD;  Location: [facility];  Service: General;  Laterality: N/A;   LAPAROSCOPIC VAGINAL HYSTERECTOMY WITH SALPINGECTOMY Bilateral 07/29/2021   Procedure: ATTEMPTED LAPAROSCOPIC ASSISTED VAGINAL HYSTERECTOMY WITH SALPINGECTOMY;  Surgeon: Janyth Contes, MD;  Location: [place];  Service: Gynecology;  Laterality: Bilateral;   MYOMECTOMY N/A 08/06/2020   Procedure: ABDOMINAL MYOMECTOMY;  Surgeon: Janyth Contes, MD;  Location: [place];   Service: Gynecology;  Laterality: N/A;  TAP BLOCK and general   SUPRACERVICAL ABDOMINAL HYSTERECTOMY N/A 07/29/2021   Procedure: HYSTERECTOMY SUPRACERVICAL ABDOMINAL;  Surgeon: Janyth Contes, MD;  Location: [place];  Service: Gynecology;  Laterality: N/A;   TUBAL LIGATION  0000000   UMBILICAL HERNIA REPAIR N/A 12/02/2012   Procedure: LAPAROSCOPIC UMBILICAL HERNIA;  Surgeon: Adin Hector, MD;  Location: [facility];  Service: General;  Laterality: N/A;   WISDOM TOOTH EXTRACTION  2001    OB History     Gravida  3   Para  3   Term  3   Preterm      AB      Living  3      SAB      IAB      Ectopic      Multiple      Live Births  2            Home Medications    Prior to Admission medications   Medication Sig Start Date End Date Taking? Authorizing Provider  predniSONE (DELTASONE) 20 MG tablet Take 2 tablets (40 mg total) by mouth daily with breakfast for 5 days. 07/26/22 07/31/22 Yes Francene Finders, PA-C  amLODipine (NORVASC) 10 MG tablet Take 10 mg by mouth daily. 09/07/18   [provider]  cetirizine (ZYRTEC ALLERGY) 10 MG tablet Take 1 tablet (10 mg total) by mouth at bedtime. 06/03/22 11/30/22  Lynden Oxford Scales, PA-C  Cyanocobalamin (B-12) 1000 MCG SUBL Place 1,000 mcg under the tongue daily. 03/12/20   Brunetta Genera, MD  cycloSPORINE (RESTASIS) 0.05 % ophthalmic emulsion Place 1 drop into both eyes 2 (two) times daily as needed (dry eyes).    [provider]  ergocalciferol (VITAMIN D2) 1.25 MG (50000 UT) capsule Take 50,000 Units by mouth every Thursday.    [provider]  fluticasone (FLONASE) 50 MCG/ACT nasal spray Place 1 spray into both nostrils daily. Begin by using 2 sprays in each nare daily for 3 to 5 days, then decrease to 1 spray in each nare daily. 06/03/22   Lynden Oxford Scales, PA-C  gabapentin (NEURONTIN) 300 MG capsule Take 300 mg by mouth at bedtime. 01/30/22   [provider]   guaifenesin (HUMIBID E) 400 MG TABS tablet Take 1 tablet 3 times daily as needed for chest congestion and cough 06/03/22   Lynden Oxford Scales, PA-C  ibuprofen (ADVIL) 800 MG tablet Take 1 tablet (800 mg total) by mouth every 8 (eight) hours as needed for moderate pain (mild pain). 07/30/21   Bovard-Stuckert, Jeral Fruit, MD  Multiple Vitamin (MULTIVITAMIN WITH MINERALS) TABS tablet Take 1 tablet by mouth daily.    [provider]  omeprazole (PRILOSEC) 20 MG capsule Take 20 mg by mouth daily as needed (acid reflux).     [provider]  promethazine-dextromethorphan (PROMETHAZINE-DM) 6.25-15 MG/5ML syrup Take 5 mLs by mouth 4 (four) times daily  as needed for cough. 06/03/22   Lynden Oxford Scales, PA-C    Family History Family History  Problem Relation Age of Onset   Hypertension Mother    Heart disease Father    Stroke Father    Other Neg Hx     Social History Social History   Tobacco Use   Smoking status: Former    Packs/day: 0.25    Years: 1.00    Total pack years: 0.25    Types: Cigarettes    Quit date: 2014    Years since quitting: 10.1   Smokeless tobacco: Never  Vaping Use   Vaping Use: Never used  Substance Use Topics   Alcohol use: No   Drug use: No     Allergies   Percocet [oxycodone-acetaminophen]   Review of Systems Review of Systems  Constitutional:  Negative for chills and fever.  HENT:  Positive for congestion and rhinorrhea.   Eyes:  Negative for discharge and redness.  Respiratory:  Positive for cough. Negative for shortness of breath.   Gastrointestinal:  Negative for diarrhea, nausea and vomiting.     Physical Exam Triage Vital Signs ED Triage Vitals  Enc Vitals Group     BP 07/26/22 1305 (!) 147/87     Pulse Rate 07/26/22 1305 92     Resp 07/26/22 1305 18     Temp 07/26/22 1305 98.2 F (36.8 C)     Temp Source 07/26/22 1305 Oral     SpO2 07/26/22 1305 97 %     Weight --      Height --      Head Circumference --       Peak Flow --      Pain Score 07/26/22 1306 0     Pain Loc --      Pain Edu? --      Excl. in [place]? --    No data found.  Updated Vital Signs BP (!) 147/87 (BP Location: Left Arm)   Pulse 92   Temp 98.2 F (36.8 C) (Oral)   Resp 18   LMP 07/01/2021 (Exact Date)   SpO2 97%      Physical Exam Vitals and nursing note reviewed.  Constitutional:      General: She is not in acute distress.    Appearance: Normal appearance. She is not ill-appearing.  HENT:     Head: Normocephalic and atraumatic.     Nose: Congestion present.     Mouth/Throat:     Mouth: Mucous membranes are moist.     Pharynx: No oropharyngeal exudate or posterior oropharyngeal erythema.  Eyes:     Conjunctiva/sclera: Conjunctivae normal.  Cardiovascular:     Rate and Rhythm: Normal rate and regular rhythm.     Heart sounds: Normal heart sounds. No murmur heard. Pulmonary:     Effort: Pulmonary effort is normal. No respiratory distress.     Breath sounds: Normal breath sounds. No wheezing, rhonchi or rales.  Skin:    General: Skin is warm and dry.  Neurological:     Mental Status: She is alert.  Psychiatric:        Mood and Affect: Mood normal.        Thought Content: Thought content normal.      UC Treatments / Results  Labs (all labs ordered are listed, but only abnormal results are displayed) Labs Reviewed - No data to display  EKG   Radiology No results found.  Procedures Procedures (including critical care time)  Medications Ordered in UC Medications - No data to display  Initial Impression / Assessment and Plan / UC Course  I have reviewed the triage vital signs and the nursing notes.  Pertinent labs & imaging results that were available during my care of the patient were reviewed by me and considered in my medical decision making (see chart for details).    Suspect viral etiology of symptoms but given worsening cough, history of bronchitis will treat with steroid burst. Encouraged  follow up if no gradual improvement or with any further concerns.   Final Clinical Impressions(s) / UC Diagnoses   Final diagnoses:  Acute bronchitis, unspecified organism   Discharge Instructions   None    ED Prescriptions     Medication Sig Dispense Auth. Provider   predniSONE (DELTASONE) 20 MG tablet Take 2 tablets (40 mg total) by mouth daily with breakfast for 5 days. 10 tablet Francene Finders, PA-C      PDMP not reviewed this encounter.   Francene Finders, PA-C 07/26/22 1338

## 2022-09-06 ENCOUNTER — Ambulatory Visit
Admission: EM | Admit: 2022-09-06 | Discharge: 2022-09-06 | Disposition: A | Discharge status: Home / Self Care | Payer: Medicaid Other | Attending: Physician Assistant | Admitting: Physician Assistant

## 2022-09-06 DIAGNOSIS — N898 Other specified noninflammatory disorders of vagina: Secondary | ICD-10-CM

## 2022-09-06 DIAGNOSIS — L298 Other pruritus: Secondary | ICD-10-CM | POA: Diagnosis not present

## 2022-09-06 DIAGNOSIS — N760 Acute vaginitis: Secondary | ICD-10-CM | POA: Diagnosis present

## 2022-09-06 MED ORDER — FLUCONAZOLE 150 MG PO TABS: 150.0000 mg | ORAL_TABLET | Freq: Once | ORAL | 0 refills | Status: AC

## 2022-09-06 MED ORDER — TERCONAZOLE 0.8 % VA CREA: 1.0000 | TOPICAL_CREAM | Freq: Every day | VAGINAL | 0 refills | Status: AC

## 2022-09-06 NOTE — ED Provider Notes (Signed)
[name]    CSN: [id number] Arrival date & time: 09/06/22  1508      History   Chief Complaint Chief Complaint  Patient presents with   Vaginitis    HPI [name] is a 37 y.o. female.   Patient presents today with a 1 week history of vaginal irritation.  She reports that thick white discharge.  Symptoms began soon after she was treated with metronidazole for bacterial vaginosis.  She was seen by her primary care who prescribed 2 doses of Diflucan.  Her symptoms have improved but not yet resolved.  She denies any genital lesions.  Denies any urinary symptoms including frequency, urgency, dysuria.  Denies history of diabetes or immunosuppression.  She does not take SGLT2 inhibitor.  She has no concern for pregnancy.  She does report changing her soap and wonders if this could have triggered symptoms.  Denies any additional changes to her personal hygiene products including detergents.  Denies any additional antibiotic use.    Past Medical History:  Diagnosis Date   Abnormal Pap smear    colposcopy, mild dysplasia, HPV   Acid reflux    Anemia    Anxiety    on meds- stable   Bipolar disorder (HCC)    Bronchitis    Chlamydia    COVID-19 04/2020   sore throat, fever, SOB, runny nose, headache. Pt was hospitalized due to low oxygen levels.   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 Per 07/17/21 phone call with patient, she wears a CPAP nightly.   Trichomonas    Urinary tract infection    Uterine leiomyoma 05/01/2020   Wears  glasses     Patient Active Problem List   Diagnosis Date Noted   S/P abdominal supracervical subtotal hysterectomy 07/30/2021   S/P total abdominal hysterectomy 07/29/2021   S/P TAH (total abdominal hysterectomy) 07/29/2021   Abnormal uterine bleeding (AUB) 07/03/2021   S/P myomectomy 08/06/2020   Uterine fibroid 08/06/2020   Uterine leiomyoma 05/01/2020   Acute hypoxemic respiratory failure due to COVID-19 ([place]) 04/20/2020   Iron deficiency anemia 03/06/2020   Incarcerated incisional hernia s/p lap repair 12/02/2012 12/03/2012   Hypertension    Acid reflux    Anxiety    Obesity, Class III, BMI 40-49.9 (morbid obesity) ([place])    Sleep apnea    Chronic cholecystitis with calculus s/p lap chole [id number] 10/04/2012    Past Surgical History:  Procedure Laterality Date   ABDOMINAL SURGERY     CESAREAN SECTION  05/02/03,05/04/08,10/23/10   x3   CHOLECYSTECTOMY N/A 10/07/2012   Procedure: LAPAROSCOPIC CHOLECYSTECTOMY WITH INTRAOPERATIVE CHOLANGIOGRAM;  Surgeon: Imogene Burn. Georgette Dover, MD;  Location: [facility];  Service: General;  Laterality: N/A;   CYSTOSCOPY N/A 07/29/2021   Procedure: CYSTOSCOPY;  Surgeon: Janyth Contes, MD;  Location: [place];  Service: Gynecology;  Laterality: N/A;   CYSTOSCOPY  07/29/2021   Procedure: CYSTOSCOPY;  Surgeon: Janyth Contes, MD;  Location: [place];  Service: Gynecology;;   ESOPHAGOGASTRODUODENOSCOPY (EGD) WITH PROPOFOL N/A 10/08/2018   Procedure: ESOPHAGOGASTRODUODENOSCOPY (EGD) WITH PROPOFOL;  Surgeon: Carol Ada, MD;  Location: [facility];  Service: Endoscopy;  Laterality: N/A;   FOOT SURGERY  1995   INSERTION OF MESH N/A 12/02/2012   Procedure: INSERTION OF MESH;  Surgeon: Adin Hector, MD;  Location: [facility];  Service: General;  Laterality: N/A;   LAPAROSCOPIC VAGINAL HYSTERECTOMY WITH SALPINGECTOMY Bilateral 07/29/2021   Procedure: ATTEMPTED LAPAROSCOPIC ASSISTED VAGINAL HYSTERECTOMY WITH  SALPINGECTOMY;  Surgeon: Janyth Contes, MD;  Location: [place];  Service: Gynecology;  Laterality: Bilateral;   MYOMECTOMY N/A 08/06/2020   Procedure: ABDOMINAL MYOMECTOMY;  Surgeon: Janyth Contes, MD;  Location: [place];  Service: Gynecology;  Laterality: N/A;  TAP BLOCK and general   SUPRACERVICAL ABDOMINAL HYSTERECTOMY N/A 07/29/2021   Procedure: HYSTERECTOMY SUPRACERVICAL ABDOMINAL;  Surgeon: Janyth Contes, MD;  Location: [place];  Service: Gynecology;  Laterality: N/A;   TUBAL LIGATION  0000000   UMBILICAL HERNIA REPAIR N/A 12/02/2012   Procedure: LAPAROSCOPIC UMBILICAL HERNIA;  Surgeon: Adin Hector, MD;  Location: [facility];  Service: General;  Laterality: N/A;   WISDOM TOOTH EXTRACTION  2001    OB History     Gravida  3   Para  3   Term  3   Preterm      AB      Living  3      SAB      IAB      Ectopic      Multiple      Live Births  2            Home Medications    Prior to Admission medications   Medication Sig Start Date End Date Taking? Authorizing Provider  fluconazole (DIFLUCAN) 150 MG tablet Take 1 tablet (150 mg total) by mouth once for 1 dose. 09/06/22 09/06/22 Yes Ashli Selders, Derry Skill, PA-C  terconazole (TERAZOL 3) 0.8 % vaginal cream Place 1 applicator vaginally at bedtime. 09/06/22  Yes Jenny Clavijo K, PA-C  amLODipine (NORVASC) 10 MG tablet Take 10 mg by mouth daily. 09/07/18   [provider]  cetirizine (ZYRTEC ALLERGY) 10 MG tablet Take 1 tablet (10 mg total) by mouth at bedtime. 06/03/22 11/30/22  Lynden Oxford Scales, PA-C  Cyanocobalamin (B-12) 1000 MCG SUBL Place 1,000 mcg under the tongue daily. 03/12/20   Brunetta Genera, MD  cycloSPORINE (RESTASIS) 0.05 % ophthalmic emulsion Place 1 drop into both eyes 2 (two) times daily as needed (dry eyes).    [provider]  ergocalciferol (VITAMIN D2) 1.25 MG (50000 UT) capsule Take 50,000 Units by mouth every Thursday.     [provider]  fluticasone (FLONASE) 50 MCG/ACT nasal spray Place 1 spray into both nostrils daily. Begin by using 2 sprays in each nare daily for 3 to 5 days, then decrease to 1 spray in each nare daily. 06/03/22   Lynden Oxford Scales, PA-C  gabapentin (NEURONTIN) 300 MG capsule Take 300 mg by mouth at bedtime. 01/30/22   [provider]  guaifenesin (HUMIBID E) 400 MG TABS tablet Take 1 tablet 3 times daily as needed for chest congestion and cough 06/03/22   Lynden Oxford Scales, PA-C  ibuprofen (ADVIL) 800 MG tablet Take 1 tablet (800 mg total) by mouth every 8 (eight) hours as needed for moderate pain (mild pain). 07/30/21   Bovard-Stuckert, Jeral Fruit, MD  Multiple Vitamin (MULTIVITAMIN WITH  MINERALS) TABS tablet Take 1 tablet by mouth daily.    [provider]  omeprazole (PRILOSEC) 20 MG capsule Take 20 mg by mouth daily as needed (acid reflux).     [provider]  promethazine-dextromethorphan (PROMETHAZINE-DM) 6.25-15 MG/5ML syrup Take 5 mLs by mouth 4 (four) times daily as needed for cough. 06/03/22   Lynden Oxford Scales, PA-C    Family History Family History  Problem Relation Age of Onset   Hypertension Mother    Heart disease Father    Stroke Father    Other Neg Hx     Social History Social History   Tobacco Use   Smoking status: Former    Packs/day: 0.25    Years: 1.00    Additional pack years: 0.00    Total pack years: 0.25    Types: Cigarettes    Quit date: 2014    Years since quitting: 10.2   Smokeless tobacco: Never  Vaping Use   Vaping Use: Never used  Substance Use Topics   Alcohol use: No   Drug use: No     Allergies   Percocet [oxycodone-acetaminophen]   Review of Systems Review of Systems  Constitutional:  Negative for activity change, appetite change, fatigue and fever.  Gastrointestinal:  Negative for abdominal pain, diarrhea, nausea and vomiting.  Genitourinary:  Positive for vaginal discharge and vaginal  pain (irritation). Negative for dysuria, frequency, pelvic pain, urgency and vaginal bleeding.     Physical Exam Triage Vital Signs ED Triage Vitals [09/06/22 1549]  Enc Vitals Group     BP 129/87     Pulse Rate 81     Resp 18     Temp 97.7 F (36.5 C)     Temp Source Oral     SpO2 97 %     Weight      Height      Head Circumference      Peak Flow      Pain Score 2     Pain Loc      Pain Edu?      Excl. in [place]?    No data found.  Updated Vital Signs BP 129/87 (BP Location: Right Arm)   Pulse 81   Temp 97.7 F (36.5 C) (Oral)   Resp 18   LMP 07/01/2021 (Exact Date)   SpO2 97%   Visual Acuity Right Eye Distance:   Left Eye Distance:   Bilateral Distance:    Right Eye Near:   Left Eye Near:    Bilateral Near:     Physical Exam Vitals reviewed. Exam conducted with a chaperone present.  Constitutional:      General: She is awake. She is not in acute distress.    Appearance: Normal appearance. She is well-developed. She is not ill-appearing.     Comments: Very pleasant female appears stated age in no acute distress sitting comfortably in exam room  HENT:     Head: Normocephalic and atraumatic.  Cardiovascular:     Rate and Rhythm: Normal rate and regular rhythm.     Heart sounds: Normal heart sounds, S1 normal and S2 normal. No murmur heard. Pulmonary:     Effort: Pulmonary effort is normal.     Breath sounds: Normal breath sounds. No wheezing, rhonchi or rales.     Comments: Clear to auscultation bilaterally Abdominal:     General: Bowel sounds are normal.     Palpations: Abdomen is soft.     Tenderness: There is no abdominal  tenderness. There is no right CVA tenderness, left CVA tenderness, guarding or rebound.     Comments: Benign abdominal exam  Genitourinary:    Labia:        Right: No rash or tenderness.        Left: No rash or tenderness.      Vagina: Vaginal discharge present. No tenderness.     Cervix: Normal.     Comments: Thick white discharge  noted posterior vaginal vault.  Janett Billow, RN present as chaperone during exam. Psychiatric:        Behavior: Behavior is cooperative.      UC Treatments / Results  Labs (all labs ordered are listed, but only abnormal results are displayed) Labs Reviewed  CERVICOVAGINAL ANCILLARY ONLY    EKG   Radiology No results found.  Procedures Procedures (including critical care time)  Medications Ordered in UC Medications - No data to display  Initial Impression / Assessment and Plan / UC Course  I have reviewed the triage vital signs and the nursing notes.  Pertinent labs & imaging results that were available during my care of the patient were reviewed by me and considered in my medical decision making (see chart for details).     Patient is well-appearing, afebrile, nontoxic, nontachycardic.  Vital signs and physical exam are reassuring with no indication for emergent evaluation or imaging.  Physical exam is consistent with yeast infection.  She was given a third dose of Diflucan (already completed 2 doses) and started on topical medication (terconazole at night).  Recommended that she use hypoallergenic soaps and detergents and wear loosefitting cotton underwear.  If her symptoms are not improving recommend that she follow-up with OB/GYN for further evaluation and management.  She was given contact information for local provider with instruction to call to schedule an appointment.  If she has any worsening symptoms she is to be seen immediately including abdominal pain, pelvic pain, fever, nausea, vomiting.  Strict return precautions given.  Patient declined work excuse note.  Final Clinical Impressions(s) / UC Diagnoses   Final diagnoses:  Acute vaginitis  Vaginal discharge     Discharge Instructions      I am concerned that you still have a yeast infection.  Take 1 more dose of Diflucan.  Also use terconazole at bedtime for the next several days.  Make sure that you are  drinking plenty of fluid.  Wear loosefitting cotton underwear and use hypoallergenic soaps and detergents.  If your symptoms or not improving within a few days with both of these treatments especially after having had already 2 doses of Diflucan I would like you to follow-up with OB/GYN.  Call them to schedule an appointment as needed.  We will contact you if we need to arrange additional treatment based on your swab result.  If anything worsens please return for reevaluation.     ED Prescriptions     Medication Sig Dispense Auth. Provider   fluconazole (DIFLUCAN) 150 MG tablet Take 1 tablet (150 mg total) by mouth once for 1 dose. 1 tablet Ivett Luebbe K, PA-C   terconazole (TERAZOL 3) 0.8 % vaginal cream Place 1 applicator vaginally at bedtime. 20 g Morgin Halls, Derry Skill, PA-C      PDMP not reviewed this encounter.   Terrilee Croak, PA-C 09/06/22 1628

## 2022-09-06 NOTE — Discharge Instructions (Signed)
I am concerned that you still have a yeast infection.  Take 1 more dose of Diflucan.  Also use terconazole at bedtime for the next several days.  Make sure that you are drinking plenty of fluid.  Wear loosefitting cotton underwear and use hypoallergenic soaps and detergents.  If your symptoms or not improving within a few days with both of these treatments especially after having had already 2 doses of Diflucan I would like you to follow-up with OB/GYN.  Call them to schedule an appointment as needed.  We will contact you if we need to arrange additional treatment based on your swab result.  If anything worsens please return for reevaluation.

## 2022-09-06 NOTE — ED Triage Notes (Signed)
Pt presents with vaginal itching and irritation X 1 week.

## 2022-09-09 LAB — CERVICOVAGINAL ANCILLARY ONLY
Bacterial Vaginitis (gardnerella): POSITIVE — AB
Candida Glabrata: NEGATIVE
Candida Vaginitis: NEGATIVE
Chlamydia: NEGATIVE
Comment: NEGATIVE
Comment: NEGATIVE
Comment: NEGATIVE
Comment: NEGATIVE
Comment: NEGATIVE
Comment: NORMAL
Neisseria Gonorrhea: NEGATIVE
Trichomonas: NEGATIVE

## 2024-05-10 ENCOUNTER — Other Ambulatory Visit: Payer: Self-pay

## 2024-05-10 ENCOUNTER — Emergency Department (HOSPITAL_BASED_OUTPATIENT_CLINIC_OR_DEPARTMENT_OTHER)
Admission: EM | Admit: 2024-05-10 | Discharge: 2024-05-11 | Disposition: A | Attending: Emergency Medicine | Admitting: Emergency Medicine

## 2024-05-10 DIAGNOSIS — H538 Other visual disturbances: Secondary | ICD-10-CM | POA: Diagnosis not present

## 2024-05-10 DIAGNOSIS — Z79899 Other long term (current) drug therapy: Secondary | ICD-10-CM | POA: Diagnosis not present

## 2024-05-10 DIAGNOSIS — R519 Headache, unspecified: Secondary | ICD-10-CM | POA: Insufficient documentation

## 2024-05-10 DIAGNOSIS — I10 Essential (primary) hypertension: Secondary | ICD-10-CM | POA: Diagnosis not present

## 2024-05-10 NOTE — ED Triage Notes (Signed)
 Pt POV reporting headache and slight blurred vision since yesterday, took BP at home (136/112).

## 2024-05-10 NOTE — ED Provider Notes (Signed)
 [place] EMERGENCY DEPARTMENT AT [facility] Provider Note   CSN: [id number] Arrival date & time: 05/10/24  2246     Patient presents with: Hypertension   [name] is a 38 y.o. female.  {Add pertinent medical, surgical, social history, OB history to HPI:32947}  Hypertension       Prior to Admission medications   Medication Sig Start Date End Date Taking? Authorizing Provider  amLODipine  (NORVASC ) 10 MG tablet Take 10 mg by mouth daily. 09/07/18   [provider]  cetirizine  (ZYRTEC  ALLERGY) 10 MG tablet Take 1 tablet (10 mg total) by mouth at bedtime. 06/03/22 11/30/22  Joesph Shaver Scales, PA-C  Cyanocobalamin (B-12) 1000 MCG SUBL Place 1,000 mcg under the tongue daily. 03/12/20   Onesimo Emaline Brink, MD  cycloSPORINE  (RESTASIS ) 0.05 % ophthalmic emulsion Place 1 drop into both eyes 2 (two) times daily as needed (dry eyes).    [provider]  ergocalciferol (VITAMIN D2) 1.25 MG (50000 UT) capsule Take 50,000 Units by mouth every Thursday.    [provider]  fluticasone  (FLONASE ) 50 MCG/ACT nasal spray Place 1 spray into both nostrils daily. Begin by using 2 sprays in each nare daily for 3 to 5 days, then decrease to 1 spray in each nare daily. 06/03/22   Joesph Shaver Scales, PA-C  gabapentin  (NEURONTIN ) 300 MG capsule Take 300 mg by mouth at bedtime. 01/30/22   [provider]  guaifenesin  (HUMIBID E) 400 MG TABS tablet Take 1 tablet 3 times daily as needed for chest congestion and cough 06/03/22   Joesph Shaver Scales, PA-C  ibuprofen  (ADVIL ) 800 MG tablet Take 1 tablet (800 mg total) by mouth every 8 (eight) hours as needed for moderate pain (mild pain). 07/30/21   Bovard-Stuckert, Jody, MD  Multiple Vitamin (MULTIVITAMIN WITH MINERALS) TABS tablet Take 1 tablet by mouth daily.    [provider]  omeprazole  (PRILOSEC) 20 MG capsule Take 20 mg by mouth daily as needed (acid reflux).     [provider]   promethazine -dextromethorphan (PROMETHAZINE -DM) 6.25-15 MG/5ML syrup Take 5 mLs by mouth 4 (four) times daily as needed for cough. 06/03/22   Joesph Shaver Scales, PA-C  terconazole  (TERAZOL 3 ) 0.8 % vaginal cream Place 1 applicator vaginally at bedtime. 09/06/22   Raspet, Erin K, PA-C    Allergies: Percocet [oxycodone -acetaminophen ]    Review of Systems  Updated Vital Signs BP (!) 135/97   Pulse 70   Temp 98 F (36.7 C)   Resp 16   Ht 5' 5 (1.651 m)   Wt (!) 142.4 kg   LMP 07/01/2021 (Exact Date)   SpO2 98%   BMI 52.25 kg/m   Physical Exam  (all labs ordered are listed, but only abnormal results are displayed) Labs Reviewed - No data to display  EKG: None  Radiology: No results found.  {Document cardiac monitor, telemetry assessment procedure when appropriate:32947} Procedures   Medications Ordered in the ED - No data to display    {Click here for ABCD2, HEART and other calculators REFRESH Note before signing:1}                              Medical Decision Making  ***  {Document critical care time when appropriate  Document review of labs and clinical decision tools ie [id number], etc  Document your independent review of radiology images and any outside records  Document your discussion with family members, caretakers and  with consultants  Document social determinants of health affecting pt's care  Document your decision making why or why not admission, treatments were needed:32947:::1}   Final diagnoses:  None    ED Discharge Orders     None

## 2024-05-16 ENCOUNTER — Other Ambulatory Visit: Payer: Self-pay

## 2024-05-16 ENCOUNTER — Ambulatory Visit
Admission: EM | Admit: 2024-05-16 | Discharge: 2024-05-16 | Disposition: A | Discharge status: Home / Self Care | Attending: Family Medicine | Admitting: Family Medicine

## 2024-05-16 DIAGNOSIS — Z113 Encounter for screening for infections with a predominantly sexual mode of transmission: Secondary | ICD-10-CM

## 2024-05-16 NOTE — ED Triage Notes (Signed)
 Pt wants STI testing. Pt denies sx.

## 2024-05-16 NOTE — Discharge Instructions (Addendum)
 The clinical contact you with results of the testing done today if positive.  Follow-up as needed.

## 2024-05-16 NOTE — ED Provider Notes (Signed)
 [facility]    CSN: [id number] Arrival date & time: 05/16/24  1503      History   Chief Complaint No chief complaint on file.   HPI [name] is a 38 y.o. female presents for STD testing.  Patient currently denies any symptoms including vaginal discharge, dysuria, fevers, flank pain.  No known STD exposure.  No other concerns at this time.  HPI  Past Medical History:  Diagnosis Date   Abnormal Pap smear    colposcopy, mild dysplasia, HPV   Acid reflux    Anemia    Anxiety    on meds- stable   Bipolar disorder (HCC)    Bronchitis    Chlamydia    COVID-19 04/2020   sore throat, fever, SOB, runny nose, headache. Pt was hospitalized due to low oxygen levels.   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 Per 07/17/21 phone call with patient, she wears a CPAP nightly.   Trichomonas    Urinary tract infection    Uterine leiomyoma 05/01/2020   Wears glasses     Patient Active Problem List   Diagnosis Date Noted   S/P abdominal supracervical subtotal hysterectomy 07/30/2021   S/P total abdominal hysterectomy 07/29/2021   S/P TAH (total abdominal hysterectomy) 07/29/2021   Abnormal uterine bleeding (AUB) 07/03/2021   S/P myomectomy 08/06/2020   Uterine fibroid 08/06/2020   Uterine leiomyoma 05/01/2020   Acute hypoxemic respiratory failure due to COVID-19 (HCC) 04/20/2020   Iron deficiency anemia 03/06/2020   Incarcerated incisional hernia s/p lap repair 12/02/2012 12/03/2012   Hypertension    Acid reflux    Anxiety     Obesity, Class III, BMI 40-49.9 (morbid obesity) (HCC)    Sleep apnea    Chronic cholecystitis with calculus s/p lap chole [id number] 10/04/2012    Past Surgical History:  Procedure Laterality Date   ABDOMINAL SURGERY     CESAREAN SECTION  05/02/03,05/04/08,10/23/10   x3   CHOLECYSTECTOMY N/A 10/07/2012   Procedure: LAPAROSCOPIC CHOLECYSTECTOMY WITH INTRAOPERATIVE CHOLANGIOGRAM;  Surgeon: Donnice POUR. Belinda, MD;  Location: [facility];  Service: General;  Laterality: N/A;   CYSTOSCOPY N/A 07/29/2021   Procedure: CYSTOSCOPY;  Surgeon: Calina Rom, MD;  Location: [facility];  Service: Gynecology;  Laterality: N/A;   CYSTOSCOPY  07/29/2021   Procedure: CYSTOSCOPY;  Surgeon: Rakeb Rom, MD;  Location[facility];  Service: Gynecology;;   ESOPHAGOGASTRODUODENOSCOPY (EGD) WITH PROPOFOL  N/A 10/08/2018   Procedure: ESOPHAGOGASTRODUODENOSCOPY (EGD) WITH PROPOFOL ;  Surgeon: Rollin Dover, MD;  Location: [facility];  Service: Endoscopy;  Laterality: N/A;   FOOT SURGERY  1995   INSERTION OF MESH N/A 12/02/2012   Procedure: INSERTION OF MESH;  Surgeon: Elspeth KYM Schultze, MD;  Location: [facility];  Service: General;  Laterality: N/A;   LAPAROSCOPIC VAGINAL HYSTERECTOMY WITH SALPINGECTOMY Bilateral 07/29/2021   Procedure: ATTEMPTED LAPAROSCOPIC ASSISTED VAGINAL HYSTERECTOMY WITH SALPINGECTOMY;  Surgeon: Ezma Rom, MD;  Location: [facility];  Service: Gynecology;  Laterality: Bilateral;   MYOMECTOMY N/A 08/06/2020   Procedure: ABDOMINAL MYOMECTOMY;  Surgeon: Shanay Rom, MD;  Location: [facility];  Service: Gynecology;  Laterality: N/A;  TAP BLOCK and general   SUPRACERVICAL ABDOMINAL HYSTERECTOMY N/A 07/29/2021   Procedure: HYSTERECTOMY SUPRACERVICAL ABDOMINAL;  Surgeon: Cherre Rom, MD;  Location: [facility];  Service: Gynecology;  Laterality: N/A;   TUBAL LIGATION  2012   UMBILICAL HERNIA REPAIR N/A  12/02/2012   Procedure: LAPAROSCOPIC UMBILICAL HERNIA;  Surgeon: Elspeth KYM Schultze, MD;  Location: [facility];  Service: General;  Laterality: N/A;   WISDOM TOOTH EXTRACTION  2001    OB History     Gravida  3   Para  3   Term  3   Preterm      AB      Living  3      SAB      IAB      Ectopic      Multiple      Live Births  2            Home Medications    Prior to Admission medications   Medication Sig Start Date End Date Taking? Authorizing Provider  amLODipine  (NORVASC ) 10 MG tablet Take 10 mg by mouth daily. 09/07/18   [provider]  cetirizine  (ZYRTEC  ALLERGY) 10 MG tablet Take 1 tablet (10 mg total) by mouth at bedtime. 06/03/22 11/30/22  Joesph Shaver Scales, PA-C  Cyanocobalamin (B-12) 1000 MCG SUBL Place 1,000 mcg under the tongue daily. 03/12/20   Onesimo Emaline Brink, MD  cycloSPORINE  (RESTASIS ) 0.05 % ophthalmic emulsion Place 1 drop into both eyes 2 (two) times daily as needed (dry eyes).    [provider]  ergocalciferol (VITAMIN D2) 1.25 MG (50000 UT) capsule Take 50,000 Units by mouth every Thursday.    [provider]  fluticasone  (FLONASE ) 50 MCG/ACT nasal spray Place 1 spray into both nostrils daily. Begin by using 2 sprays in each nare daily for 3 to 5 days, then decrease to 1 spray in each nare daily. 06/03/22   Joesph Shaver Scales, PA-C  gabapentin  (NEURONTIN ) 300 MG capsule Take 300 mg by mouth at bedtime. 01/30/22   [provider]  guaifenesin  (HUMIBID E) 400 MG TABS tablet Take 1 tablet 3 times daily as needed for chest congestion and cough 06/03/22   Joesph Shaver Scales, PA-C  hydrochlorothiazide  (HYDRODIURIL ) 12.5 MG tablet Take 12.5 mg by mouth daily.    [provider]  ibuprofen  (ADVIL ) 800 MG tablet Take 1 tablet (800 mg total) by mouth every 8 (eight) hours as needed for moderate pain (mild pain). 07/30/21   Bovard-Stuckert, Jereme Loren, MD  Multiple Vitamin (MULTIVITAMIN WITH MINERALS) TABS  tablet Take 1 tablet by mouth daily.    [provider]  omeprazole  (PRILOSEC) 20 MG capsule Take 20 mg by mouth daily as needed (acid reflux).     [provider]  promethazine -dextromethorphan (PROMETHAZINE -DM) 6.25-15 MG/5ML syrup Take 5 mLs by mouth 4 (four) times daily as needed for cough. 06/03/22   Joesph Shaver Scales, PA-C  terconazole  (TERAZOL 3 ) 0.8 % vaginal cream Place 1 applicator vaginally at bedtime. 09/06/22   Raspet, Rocky POUR, PA-C    Family History Family History  Problem Relation Age of Onset   Hypertension Mother    Heart disease Father    Stroke Father    Other Neg Hx     Social History Social History  Tobacco Use   Smoking status: Former    Current packs/day: 0.00    Average packs/day: 0.3 packs/day for 1 year (0.3 ttl pk-yrs)    Types: Cigarettes    Start date: 2013    Quit date: 2014    Years since quitting: 11.9   Smokeless tobacco: Never  Vaping Use   Vaping status: Never Used  Substance Use Topics   Alcohol use: No   Drug use: No     Allergies   Percocet [oxycodone -acetaminophen ]   Review of Systems Review of Systems  Genitourinary:        STD screening     Physical Exam Triage Vital Signs ED Triage Vitals  Encounter Vitals Group     BP 05/16/24 1533 138/89     Girls Systolic BP Percentile --      Girls Diastolic BP Percentile --      Boys Systolic BP Percentile --      Boys Diastolic BP Percentile --      Pulse Rate 05/16/24 1533 73     Resp 05/16/24 1533 20     Temp 05/16/24 1533 98.1 F (36.7 C)     Temp Source 05/16/24 1533 Oral     SpO2 05/16/24 1533 96 %     Weight --      Height --      Head Circumference --      Peak Flow --      Pain Score 05/16/24 1529 0     Pain Loc --      Pain Education --      Exclude from Growth Chart --    No data found.  Updated Vital Signs BP 138/89   Pulse 73   Temp 98.1 F (36.7 C) (Oral)   Resp 20   LMP 07/01/2021 (Exact Date)   SpO2 96%   Visual  Acuity Right Eye Distance:   Left Eye Distance:   Bilateral Distance:    Right Eye Near:   Left Eye Near:    Bilateral Near:     Physical Exam Vitals and nursing note reviewed.  Constitutional:      Appearance: Normal appearance.  HENT:     Head: Normocephalic and atraumatic.  Eyes:     Pupils: Pupils are equal, round, and reactive to light.  Cardiovascular:     Rate and Rhythm: Normal rate.  Pulmonary:     Effort: Pulmonary effort is normal.  Skin:    General: Skin is warm and dry.  Neurological:     General: No focal deficit present.     Mental Status: She is alert and oriented to person, place, and time.  Psychiatric:        Mood and Affect: Mood normal.        Behavior: Behavior normal.      UC Treatments / Results  Labs (all labs ordered are listed, but only abnormal results are displayed) Labs Reviewed  SYPHILIS: RPR W/REFLEX TO RPR TITER AND TREPONEMAL ANTIBODIES, TRADITIONAL SCREENING AND DIAGNOSIS ALGORITHM  HIV ANTIBODY (ROUTINE TESTING W REFLEX)  CERVICOVAGINAL ANCILLARY ONLY    EKG   Radiology No results found.  Procedures Procedures (including critical care time)  Medications Ordered in UC Medications - No data to display  Initial Impression / Assessment and Plan / UC Course  I have reviewed the triage vital signs and the nursing notes.  Pertinent labs & imaging results that were available during my care of the patient were reviewed by me  and considered in my medical decision making (see chart for details).     STD testing as ordered will contact for any positive results.  Patient to follow-up as needed Final Clinical Impressions(s) / UC Diagnoses   Final diagnoses:  Screening examination for STD (sexually transmitted disease)     Discharge Instructions      The clinical contact you with results of the testing done today if positive.  Follow-up as needed     ED Prescriptions   None    PDMP not reviewed this encounter.    Loreda Myla SAUNDERS, NP 05/16/24 1550

## 2024-05-17 LAB — CERVICOVAGINAL ANCILLARY ONLY
Chlamydia: NEGATIVE
Comment: NEGATIVE
Comment: NEGATIVE
Comment: NORMAL
Neisseria Gonorrhea: NEGATIVE
Trichomonas: NEGATIVE

## 2024-05-17 LAB — HIV ANTIBODY (ROUTINE TESTING W REFLEX): HIV Screen 4th Generation wRfx: NONREACTIVE

## 2024-05-17 LAB — SYPHILIS: RPR W/REFLEX TO RPR TITER AND TREPONEMAL ANTIBODIES, TRADITIONAL SCREENING AND DIAGNOSIS ALGORITHM: RPR Ser Ql: NONREACTIVE

## 2024-06-03 ENCOUNTER — Ambulatory Visit: Admission: EM | Admit: 2024-06-03 | Discharge: 2024-06-03 | Disposition: A | Discharge status: Home / Self Care

## 2024-06-03 DIAGNOSIS — B349 Viral infection, unspecified: Secondary | ICD-10-CM | POA: Diagnosis not present

## 2024-06-03 MED ORDER — AZELASTINE HCL 0.1 % NA SOLN: 1.0000 | Freq: Two times a day (BID) | NASAL | 1 refills | Status: AC

## 2024-06-03 MED ORDER — IBUPROFEN 600 MG PO TABS: 600.0000 mg | ORAL_TABLET | Freq: Three times a day (TID) | ORAL | 0 refills | Status: AC | PRN

## 2024-06-03 MED ORDER — PROMETHAZINE-DM 6.25-15 MG/5ML PO SYRP: 10.0000 mL | ORAL_SOLUTION | Freq: Three times a day (TID) | ORAL | 0 refills | Status: AC | PRN

## 2024-06-03 NOTE — Discharge Instructions (Addendum)
" °  1. Acute viral syndrome (Primary) - promethazine -dextromethorphan (PROMETHAZINE -DM) 6.25-15 MG/5ML syrup; Take 10 mLs by mouth 3 (three) times daily as needed.  Dispense: 240 mL; Refill: 0 - azelastine  (ASTELIN ) 0.1 % nasal spray; Place 1 spray into both nostrils 2 (two) times daily. Use in each nostril as directed  Dispense: 30 mL; Refill: 1 - ibuprofen  (ADVIL ) 600 MG tablet; Take 1 tablet (600 mg total) by mouth every 8 (eight) hours as needed for moderate pain (pain score 4-6).  Dispense: 30 tablet; Refill: 0 - No viral testing completed in UC due to symptoms lasting 5 days or greater, testing would not alter prescribed medication therapy.  -Continue to monitor symptoms for any change in severity if there is any escalation of current symptoms or development of new symptoms follow-up in ER for further evaluation and management. "

## 2024-06-03 NOTE — ED Triage Notes (Signed)
 Pt states that she has some nasal congestion, coughing, chest congestion and body aches. X5 days  Pt states that she hasn't been taking any otc medications.

## 2024-06-03 NOTE — ED Provider Notes (Signed)
 " [facility]  Note:  This document was prepared using Dragon voice recognition software and may include unintentional dictation errors.  MRN: [id number] DOB: 03-Nov-1985  Subjective:   [name] is a 38 y.o. female presenting for nasal congestion, cough, chest congestion, body aches x 4 to 5 days.  Patient denies any known exposure to any sick contacts other than her son who had symptoms for about 2 days prior to the onset of her symptoms.  Patient reports she has been taking no over-the-counter medication to treat symptoms prior to arrival in urgent care.  No shortness of breath, chest pain, weakness, dizziness, wheezing, nausea/vomiting, diarrhea.  Current Medications[1]   Allergies[2]  Past Medical History:  Diagnosis Date   Abnormal Pap smear    colposcopy, mild dysplasia, HPV   Acid reflux    Anemia    Anxiety    on meds- stable   Bipolar disorder (HCC)    Bronchitis    Chlamydia    COVID-19 04/2020   sore throat, fever, SOB, runny nose, headache. Pt was hospitalized due to low oxygen levels.   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 Per 07/17/21 phone call with patient, she wears a CPAP nightly.   Trichomonas    Urinary tract infection    Uterine leiomyoma 05/01/2020   Wears glasses      Past Surgical History:  Procedure Laterality Date   ABDOMINAL SURGERY     CESAREAN SECTION  05/02/03,05/04/08,10/23/10   x3   CHOLECYSTECTOMY N/A 10/07/2012   Procedure: LAPAROSCOPIC CHOLECYSTECTOMY WITH INTRAOPERATIVE CHOLANGIOGRAM;   Surgeon: Donnice POUR. Belinda, MD;  Location: [facility];  Service: General;  Laterality: N/A;   CYSTOSCOPY N/A 07/29/2021   Procedure: CYSTOSCOPY;  Surgeon: Laprecious Rom, MD;  Location: [facility];  Service: Gynecology;  Laterality: N/A;   CYSTOSCOPY  07/29/2021   Procedure: CYSTOSCOPY;  Surgeon: Kandise Rom, MD;  Location: [facility];  Service: Gynecology;;   ESOPHAGOGASTRODUODENOSCOPY (EGD) WITH PROPOFOL  N/A 10/08/2018   Procedure: ESOPHAGOGASTRODUODENOSCOPY (EGD) WITH PROPOFOL ;  Surgeon: Rollin Dover, MD;  Location: [facility];  Service: Endoscopy;  Laterality: N/A;   FOOT SURGERY  1995   INSERTION OF MESH N/A 12/02/2012   Procedure: INSERTION OF MESH;  Surgeon: Elspeth KYM Schultze, MD;  Location: [facility];  Service: General;  Laterality: N/A;   LAPAROSCOPIC VAGINAL HYSTERECTOMY WITH SALPINGECTOMY Bilateral 07/29/2021   Procedure: ATTEMPTED LAPAROSCOPIC ASSISTED VAGINAL HYSTERECTOMY WITH SALPINGECTOMY;  Surgeon: Sorina Rom, MD;  Location: [facility];  Service: Gynecology;  Laterality: Bilateral;   MYOMECTOMY N/A 08/06/2020   Procedure: ABDOMINAL MYOMECTOMY;  Surgeon: Jeweldean Rom, MD;  Location: [facility];  Service: Gynecology;  Laterality: N/A;  TAP BLOCK and general   SUPRACERVICAL ABDOMINAL HYSTERECTOMY N/A 07/29/2021   Procedure: HYSTERECTOMY SUPRACERVICAL ABDOMINAL;  Surgeon: Jenie Rom, MD;  [facility];  Service: Gynecology;  Laterality: N/A;   TUBAL LIGATION  2012   UMBILICAL  HERNIA REPAIR N/A 12/02/2012   Procedure: LAPAROSCOPIC UMBILICAL HERNIA;  Surgeon: Elspeth KYM Schultze, MD;  Location: [facility];  Service: General;  Laterality: N/A;   WISDOM TOOTH EXTRACTION  2001    Family History  Problem Relation Age of Onset   Hypertension Mother    Heart disease Father    Stroke Father    Other Neg Hx     Social History[3]  ROS Refer to HPI for ROS details.  Objective:     Vitals: BP 135/73 (BP Location: Left Arm)   Pulse 76   Temp 98.3 F (36.8 C) (Oral)   Resp 19   Ht 5' 5 (1.651 m)   Wt (!) 303 lb 9.6 oz (137.7 kg)   LMP 07/01/2021   SpO2 95%   BMI 50.52 kg/m   Physical Exam Vitals and nursing note reviewed.  Constitutional:      General: She is not in acute distress.    Appearance: Normal appearance. She is well-developed. She is not ill-appearing or toxic-appearing.  HENT:     Head: Normocephalic and atraumatic.     Nose: Congestion present.     Mouth/Throat:     Mouth: Mucous membranes are moist.  Cardiovascular:     Rate and Rhythm: Normal rate.  Pulmonary:     Effort: Pulmonary effort is normal. No respiratory distress.     Breath sounds: No stridor. No wheezing.  Skin:    General: Skin is warm and dry.  Neurological:     General: No focal deficit present.     Mental Status: She is alert and oriented to person, place, and time.  Psychiatric:        Mood and Affect: Mood normal.        Behavior: Behavior normal.     Procedures  No results found for this or any previous visit (from the past 24 hours).  Assessment and Plan :     Discharge Instructions       1. Acute viral syndrome (Primary) - promethazine -dextromethorphan (PROMETHAZINE -DM) 6.25-15 MG/5ML syrup; Take 10 mLs by mouth 3 (three) times daily as needed.  Dispense: 240 mL; Refill: 0 - azelastine  (ASTELIN ) 0.1 % nasal spray; Place 1 spray into both nostrils 2 (two) times daily. Use in each nostril as directed  Dispense: 30 mL; Refill: 1 - ibuprofen  (ADVIL ) 600 MG tablet; Take 1 tablet (600 mg total) by mouth every 8 (eight) hours as needed for moderate pain (pain score 4-6).  Dispense: 30 tablet; Refill: 0 - No viral testing completed in UC due to symptoms lasting 5 days or greater, testing would not alter prescribed medication therapy.  -Continue to monitor symptoms for any change in severity if there is any escalation of current symptoms or development of  new symptoms follow-up in ER for further evaluation and management.       Analea Muller B Tona Qualley    [1] No current facility-administered medications for this encounter.  Current Outpatient Medications:    amLODipine  (NORVASC ) 10 MG tablet, Take 10 mg by mouth daily., Disp: , Rfl:    azelastine  (ASTELIN ) 0.1 % nasal spray, Place 1 spray into both nostrils 2 (two) times daily. Use in each nostril as directed, Disp: 30 mL, Rfl: 1   Cyanocobalamin  (B-12) 1000 MCG SUBL, Place 1,000 mcg under the tongue daily., Disp: 30 tablet, Rfl: 5   cycloSPORINE  (RESTASIS ) 0.05 % ophthalmic emulsion, Place 1 drop into both eyes 2 (two) times daily as needed (dry eyes)., Disp: , Rfl:  ergocalciferol (VITAMIN D2) 1.25 MG (50000 UT) capsule, Take 50,000 Units by mouth every Thursday., Disp: , Rfl:    fluticasone  (FLONASE ) 50 MCG/ACT nasal spray, Place 1 spray into both nostrils daily. Begin by using 2 sprays in each nare daily for 3 to 5 days, then decrease to 1 spray in each nare daily., Disp: 15.8 mL, Rfl: 2   gabapentin  (NEURONTIN ) 300 MG capsule, Take 300 mg by mouth at bedtime., Disp: , Rfl:    guaifenesin  (HUMIBID E) 400 MG TABS tablet, Take 1 tablet 3 times daily as needed for chest congestion and cough, Disp: 21 tablet, Rfl: 0   hydrochlorothiazide  (HYDRODIURIL ) 12.5 MG tablet, Take 12.5 mg by mouth daily., Disp: , Rfl:    ibuprofen  (ADVIL ) 600 MG tablet, Take 1 tablet (600 mg total) by mouth every 8 (eight) hours as needed for moderate pain (pain score 4-6)., Disp: 30 tablet, Rfl: 0   Multiple Vitamin (MULTIVITAMIN WITH MINERALS) TABS tablet, Take 1 tablet by mouth daily., Disp: , Rfl:    omeprazole  (PRILOSEC) 20 MG capsule, Take 20 mg by mouth daily as needed (acid reflux). , Disp: , Rfl:    promethazine -dextromethorphan (PROMETHAZINE -DM) 6.25-15 MG/5ML syrup, Take 10 mLs by mouth 3 (three) times daily as needed., Disp: 240 mL, Rfl: 0   terconazole  (TERAZOL 3 ) 0.8 % vaginal cream, Place 1 applicator  vaginally at bedtime., Disp: 20 g, Rfl: 0   cetirizine  (ZYRTEC  ALLERGY) 10 MG tablet, Take 1 tablet (10 mg total) by mouth at bedtime., Disp: 90 tablet, Rfl: 1 [2]  Allergies Allergen Reactions   Percocet [Oxycodone -Acetaminophen ] Hives and Rash  [3]  Social History Tobacco Use   Smoking status: Former    Current packs/day: 0.00    Average packs/day: 0.3 packs/day for 1 year (0.3 ttl pk-yrs)    Types: Cigarettes    Start date: 2013    Quit date: 2014    Years since quitting: 11.9   Smokeless tobacco: Never  Vaping Use   Vaping status: Never Used  Substance Use Topics   Alcohol use: No   Drug use: No     Aurea Goodell B, NP 06/03/24 1648  "
